# Patient Record
Sex: Female | Born: 1980
Health system: Southern US, Community
[De-identification: ages and names within clinical notes are randomized; demographics above are authoritative.]

## PROBLEM LIST (undated history)

## (undated) DIAGNOSIS — I10 Essential (primary) hypertension: Secondary | ICD-10-CM

## (undated) DIAGNOSIS — R0600 Dyspnea, unspecified: Secondary | ICD-10-CM

## (undated) DIAGNOSIS — G4730 Sleep apnea, unspecified: Secondary | ICD-10-CM

## (undated) DIAGNOSIS — Z9289 Personal history of other medical treatment: Secondary | ICD-10-CM

## (undated) DIAGNOSIS — K219 Gastro-esophageal reflux disease without esophagitis: Secondary | ICD-10-CM

## (undated) DIAGNOSIS — Z789 Other specified health status: Secondary | ICD-10-CM

## (undated) DIAGNOSIS — G459 Transient cerebral ischemic attack, unspecified: Secondary | ICD-10-CM

## (undated) DIAGNOSIS — L930 Discoid lupus erythematosus: Secondary | ICD-10-CM

## (undated) DIAGNOSIS — A0472 Enterocolitis due to Clostridium difficile, not specified as recurrent: Secondary | ICD-10-CM

## (undated) DIAGNOSIS — D539 Nutritional anemia, unspecified: Secondary | ICD-10-CM

## (undated) DIAGNOSIS — I509 Heart failure, unspecified: Secondary | ICD-10-CM

## (undated) DIAGNOSIS — M329 Systemic lupus erythematosus, unspecified: Secondary | ICD-10-CM

## (undated) DIAGNOSIS — N189 Chronic kidney disease, unspecified: Secondary | ICD-10-CM

## (undated) DIAGNOSIS — I429 Cardiomyopathy, unspecified: Secondary | ICD-10-CM

## (undated) DIAGNOSIS — I639 Cerebral infarction, unspecified: Secondary | ICD-10-CM

## (undated) DIAGNOSIS — I272 Pulmonary hypertension, unspecified: Secondary | ICD-10-CM

## (undated) HISTORY — PX: OTHER SURGICAL HISTORY: SHX169

## (undated) HISTORY — DX: Systemic lupus erythematosus, unspecified: M32.9

## (undated) HISTORY — DX: Nutritional anemia, unspecified: D53.9

## (undated) HISTORY — PX: CARDIAC CATHETERIZATION: SHX172

## (undated) HISTORY — DX: Enterocolitis due to Clostridium difficile, not specified as recurrent: A04.72

## (undated) HISTORY — DX: Essential (primary) hypertension: I10

## (undated) HISTORY — DX: Discoid lupus erythematosus: L93.0

## (undated) HISTORY — PX: BREAST SURGERY: SHX581

## (undated) HISTORY — DX: Cardiomyopathy, unspecified: I42.9

## (undated) SURGERY — Surgical Case
Anesthesia: *Unknown

---

## 2002-08-30 ENCOUNTER — Emergency Department (HOSPITAL_COMMUNITY): Admission: EM | Admit: 2002-08-30 | Discharge: 2002-08-30 | Payer: Self-pay | Admitting: Emergency Medicine

## 2004-06-19 ENCOUNTER — Emergency Department (HOSPITAL_COMMUNITY): Admission: EM | Admit: 2004-06-19 | Discharge: 2004-06-19 | Payer: Self-pay | Admitting: Emergency Medicine

## 2004-06-30 ENCOUNTER — Ambulatory Visit (HOSPITAL_COMMUNITY): Admission: RE | Admit: 2004-06-30 | Discharge: 2004-06-30 | Payer: Self-pay | Admitting: Neurology

## 2004-07-01 ENCOUNTER — Emergency Department (HOSPITAL_COMMUNITY): Admission: EM | Admit: 2004-07-01 | Discharge: 2004-07-01 | Payer: Self-pay | Admitting: Emergency Medicine

## 2004-07-12 ENCOUNTER — Ambulatory Visit: Admission: RE | Admit: 2004-07-12 | Discharge: 2004-07-12 | Payer: Self-pay | Admitting: Neurology

## 2004-07-12 ENCOUNTER — Encounter (INDEPENDENT_AMBULATORY_CARE_PROVIDER_SITE_OTHER): Payer: Self-pay | Admitting: Cardiology

## 2005-02-23 ENCOUNTER — Emergency Department (HOSPITAL_COMMUNITY): Admission: EM | Admit: 2005-02-23 | Discharge: 2005-02-23 | Payer: Self-pay | Admitting: Emergency Medicine

## 2005-05-07 ENCOUNTER — Emergency Department (HOSPITAL_COMMUNITY): Admission: EM | Admit: 2005-05-07 | Discharge: 2005-05-08 | Payer: Self-pay | Admitting: Emergency Medicine

## 2005-05-22 ENCOUNTER — Ambulatory Visit: Payer: Self-pay | Admitting: Family Medicine

## 2005-06-15 ENCOUNTER — Ambulatory Visit: Payer: Self-pay | Admitting: Family Medicine

## 2005-06-15 ENCOUNTER — Inpatient Hospital Stay (HOSPITAL_COMMUNITY): Admission: EM | Admit: 2005-06-15 | Discharge: 2005-06-19 | Payer: Self-pay | Admitting: Emergency Medicine

## 2005-06-19 ENCOUNTER — Ambulatory Visit: Payer: Self-pay | Admitting: *Deleted

## 2005-07-03 ENCOUNTER — Ambulatory Visit: Payer: Self-pay | Admitting: Internal Medicine

## 2005-07-07 ENCOUNTER — Ambulatory Visit: Payer: Self-pay | Admitting: Internal Medicine

## 2005-07-17 ENCOUNTER — Ambulatory Visit: Payer: Self-pay | Admitting: Internal Medicine

## 2005-08-02 ENCOUNTER — Ambulatory Visit: Payer: Self-pay | Admitting: Internal Medicine

## 2005-08-10 ENCOUNTER — Ambulatory Visit: Payer: Self-pay | Admitting: Internal Medicine

## 2005-08-15 ENCOUNTER — Ambulatory Visit (HOSPITAL_COMMUNITY): Admission: RE | Admit: 2005-08-15 | Discharge: 2005-08-15 | Payer: Self-pay | Admitting: Internal Medicine

## 2005-08-15 ENCOUNTER — Encounter (INDEPENDENT_AMBULATORY_CARE_PROVIDER_SITE_OTHER): Payer: Self-pay | Admitting: Cardiology

## 2005-10-11 ENCOUNTER — Ambulatory Visit: Payer: Self-pay | Admitting: Family Medicine

## 2005-10-27 ENCOUNTER — Encounter: Admission: RE | Admit: 2005-10-27 | Discharge: 2005-10-27 | Payer: Self-pay | Admitting: Family Medicine

## 2005-11-15 ENCOUNTER — Ambulatory Visit: Payer: Self-pay | Admitting: Family Medicine

## 2005-11-15 ENCOUNTER — Emergency Department (HOSPITAL_COMMUNITY): Admission: EM | Admit: 2005-11-15 | Discharge: 2005-11-15 | Payer: Self-pay | Admitting: Emergency Medicine

## 2006-01-15 ENCOUNTER — Ambulatory Visit: Payer: Self-pay | Admitting: Cardiology

## 2006-01-15 ENCOUNTER — Inpatient Hospital Stay (HOSPITAL_COMMUNITY): Admission: EM | Admit: 2006-01-15 | Discharge: 2006-01-23 | Payer: Self-pay | Admitting: Emergency Medicine

## 2006-01-15 ENCOUNTER — Encounter (INDEPENDENT_AMBULATORY_CARE_PROVIDER_SITE_OTHER): Payer: Self-pay | Admitting: *Deleted

## 2006-01-25 ENCOUNTER — Ambulatory Visit: Payer: Self-pay | Admitting: Cardiology

## 2006-01-29 ENCOUNTER — Emergency Department (HOSPITAL_COMMUNITY): Admission: EM | Admit: 2006-01-29 | Discharge: 2006-01-29 | Payer: Self-pay | Admitting: Emergency Medicine

## 2006-01-31 ENCOUNTER — Ambulatory Visit: Payer: Self-pay | Admitting: *Deleted

## 2006-02-01 ENCOUNTER — Ambulatory Visit: Payer: Self-pay | Admitting: Cardiology

## 2006-02-07 ENCOUNTER — Ambulatory Visit: Payer: Self-pay | Admitting: Cardiovascular Disease

## 2006-02-07 ENCOUNTER — Ambulatory Visit: Payer: Self-pay | Admitting: Cardiology

## 2006-02-09 ENCOUNTER — Ambulatory Visit: Payer: Self-pay | Admitting: Internal Medicine

## 2006-02-15 ENCOUNTER — Ambulatory Visit: Payer: Self-pay | Admitting: Cardiology

## 2006-02-26 ENCOUNTER — Ambulatory Visit: Payer: Self-pay | Admitting: Cardiology

## 2006-03-15 ENCOUNTER — Ambulatory Visit: Payer: Self-pay | Admitting: Cardiology

## 2006-04-02 ENCOUNTER — Ambulatory Visit: Payer: Self-pay | Admitting: Cardiology

## 2006-04-09 ENCOUNTER — Ambulatory Visit: Payer: Self-pay | Admitting: Hematology and Oncology

## 2006-07-19 ENCOUNTER — Ambulatory Visit: Payer: Self-pay | Admitting: Cardiology

## 2006-07-25 ENCOUNTER — Ambulatory Visit: Payer: Self-pay | Admitting: Cardiology

## 2006-08-03 ENCOUNTER — Ambulatory Visit: Payer: Self-pay | Admitting: Cardiology

## 2006-08-07 ENCOUNTER — Encounter: Payer: Self-pay | Admitting: Internal Medicine

## 2006-08-07 ENCOUNTER — Ambulatory Visit: Payer: Self-pay | Admitting: Internal Medicine

## 2006-08-07 DIAGNOSIS — D649 Anemia, unspecified: Secondary | ICD-10-CM

## 2006-08-07 DIAGNOSIS — L930 Discoid lupus erythematosus: Secondary | ICD-10-CM

## 2006-08-07 HISTORY — DX: Discoid lupus erythematosus: L93.0

## 2006-08-10 ENCOUNTER — Ambulatory Visit: Payer: Self-pay | Admitting: Cardiology

## 2006-08-24 ENCOUNTER — Ambulatory Visit: Payer: Self-pay | Admitting: Cardiology

## 2006-09-11 ENCOUNTER — Ambulatory Visit: Payer: Self-pay | Admitting: Cardiology

## 2006-09-11 ENCOUNTER — Ambulatory Visit: Payer: Self-pay

## 2006-09-12 ENCOUNTER — Ambulatory Visit: Payer: Self-pay | Admitting: Cardiovascular Disease

## 2006-09-25 ENCOUNTER — Ambulatory Visit: Payer: Self-pay

## 2006-09-25 ENCOUNTER — Ambulatory Visit: Payer: Self-pay | Admitting: Cardiology

## 2006-09-25 ENCOUNTER — Encounter: Payer: Self-pay | Admitting: Internal Medicine

## 2006-09-25 ENCOUNTER — Ambulatory Visit: Payer: Self-pay | Admitting: Internal Medicine

## 2006-11-01 ENCOUNTER — Ambulatory Visit: Payer: Self-pay | Admitting: Cardiology

## 2007-04-08 ENCOUNTER — Ambulatory Visit: Payer: Self-pay | Admitting: Internal Medicine

## 2007-06-28 ENCOUNTER — Encounter: Payer: Self-pay | Admitting: Internal Medicine

## 2007-07-10 ENCOUNTER — Encounter (HOSPITAL_COMMUNITY): Admission: RE | Admit: 2007-07-10 | Discharge: 2007-09-23 | Payer: Self-pay | Admitting: Nephrology

## 2007-08-05 ENCOUNTER — Observation Stay (HOSPITAL_COMMUNITY): Admission: EM | Admit: 2007-08-05 | Discharge: 2007-08-07 | Payer: Self-pay | Admitting: Emergency Medicine

## 2007-08-05 ENCOUNTER — Ambulatory Visit: Payer: Self-pay | Admitting: Cardiology

## 2007-08-06 ENCOUNTER — Encounter: Payer: Self-pay | Admitting: Cardiology

## 2007-08-26 ENCOUNTER — Ambulatory Visit: Payer: Self-pay | Admitting: Cardiology

## 2007-09-06 ENCOUNTER — Telehealth: Payer: Self-pay | Admitting: Internal Medicine

## 2007-11-13 ENCOUNTER — Encounter (HOSPITAL_COMMUNITY): Admission: RE | Admit: 2007-11-13 | Discharge: 2008-02-11 | Payer: Self-pay | Admitting: Nephrology

## 2008-10-02 ENCOUNTER — Ambulatory Visit: Payer: Self-pay | Admitting: Internal Medicine

## 2008-10-05 LAB — CONVERTED CEMR LAB
ALT: 22 units/L (ref 0–35)
AST: 33 units/L (ref 0–37)
Alkaline Phosphatase: 76 units/L (ref 39–117)
Basophils Absolute: 0 10*3/uL (ref 0.0–0.1)
Calcium: 8.8 mg/dL (ref 8.4–10.5)
Creatinine, Ser: 0.8 mg/dL (ref 0.4–1.2)
GFR calc non Af Amer: 91 mL/min
Hemoglobin: 10.7 g/dL — ABNORMAL LOW (ref 12.0–15.0)
Lymphocytes Relative: 28.2 % (ref 12.0–46.0)
MCHC: 32.5 g/dL (ref 30.0–36.0)
MCV: 83.6 fL (ref 78.0–100.0)
Neutro Abs: 1.6 10*3/uL (ref 1.4–7.7)
Neutrophils Relative %: 57.4 % (ref 43.0–77.0)
Pro B Natriuretic peptide (BNP): 8 pg/mL (ref 0.0–100.0)
RBC: 3.93 M/uL (ref 3.87–5.11)
RDW: 12.5 % (ref 11.5–14.6)
Total Bilirubin: 0.6 mg/dL (ref 0.3–1.2)
Total Protein: 7.3 g/dL (ref 6.0–8.3)

## 2008-11-03 ENCOUNTER — Ambulatory Visit: Payer: Self-pay | Admitting: Internal Medicine

## 2008-11-09 ENCOUNTER — Ambulatory Visit: Payer: Self-pay | Admitting: Cardiology

## 2008-11-19 ENCOUNTER — Encounter: Payer: Self-pay | Admitting: Cardiology

## 2008-11-19 ENCOUNTER — Ambulatory Visit: Payer: Self-pay

## 2009-03-02 ENCOUNTER — Ambulatory Visit: Payer: Self-pay | Admitting: Internal Medicine

## 2009-03-04 LAB — CONVERTED CEMR LAB
Alkaline Phosphatase: 74 units/L (ref 39–117)
Basophils Absolute: 0 10*3/uL (ref 0.0–0.1)
Basophils Relative: 0.2 % (ref 0.0–3.0)
Bilirubin, Direct: 0.1 mg/dL (ref 0.0–0.3)
Calcium: 8.9 mg/dL (ref 8.4–10.5)
Chloride: 111 meq/L (ref 96–112)
Eosinophils Relative: 1.8 % (ref 0.0–5.0)
HCT: 32.8 % — ABNORMAL LOW (ref 36.0–46.0)
Lymphocytes Relative: 22.1 % (ref 12.0–46.0)
Lymphs Abs: 0.7 10*3/uL (ref 0.7–4.0)
Monocytes Relative: 13.5 % — ABNORMAL HIGH (ref 3.0–12.0)
Neutro Abs: 2.1 10*3/uL (ref 1.4–7.7)
Neutrophils Relative %: 62.4 % (ref 43.0–77.0)
Phosphorus: 3.4 mg/dL (ref 2.3–4.6)
Platelets: 245 10*3/uL (ref 150.0–400.0)
Sodium: 140 meq/L (ref 135–145)

## 2009-04-07 ENCOUNTER — Ambulatory Visit: Payer: Self-pay | Admitting: Internal Medicine

## 2009-07-09 ENCOUNTER — Encounter: Payer: Self-pay | Admitting: Cardiology

## 2009-07-09 ENCOUNTER — Encounter: Payer: Self-pay | Admitting: Internal Medicine

## 2009-07-14 ENCOUNTER — Encounter (INDEPENDENT_AMBULATORY_CARE_PROVIDER_SITE_OTHER): Payer: Self-pay | Admitting: *Deleted

## 2009-07-14 DIAGNOSIS — I429 Cardiomyopathy, unspecified: Secondary | ICD-10-CM

## 2009-07-14 DIAGNOSIS — M329 Systemic lupus erythematosus, unspecified: Secondary | ICD-10-CM

## 2009-07-14 DIAGNOSIS — Z8673 Personal history of transient ischemic attack (TIA), and cerebral infarction without residual deficits: Secondary | ICD-10-CM | POA: Insufficient documentation

## 2009-07-21 ENCOUNTER — Ambulatory Visit: Payer: Self-pay | Admitting: Cardiology

## 2009-07-28 ENCOUNTER — Encounter: Payer: Self-pay | Admitting: Cardiology

## 2009-07-28 ENCOUNTER — Encounter (INDEPENDENT_AMBULATORY_CARE_PROVIDER_SITE_OTHER): Payer: Self-pay | Admitting: *Deleted

## 2009-08-05 ENCOUNTER — Ambulatory Visit: Payer: Self-pay | Admitting: Cardiovascular Disease

## 2009-08-05 ENCOUNTER — Encounter: Payer: Self-pay | Admitting: Cardiology

## 2009-08-05 ENCOUNTER — Ambulatory Visit: Payer: Self-pay

## 2009-08-05 ENCOUNTER — Ambulatory Visit (HOSPITAL_COMMUNITY): Admission: RE | Admit: 2009-08-05 | Discharge: 2009-08-05 | Payer: Self-pay | Admitting: Cardiovascular Disease

## 2009-08-10 ENCOUNTER — Telehealth: Payer: Self-pay | Admitting: Cardiology

## 2009-08-11 ENCOUNTER — Encounter: Admission: RE | Admit: 2009-08-11 | Discharge: 2009-08-11 | Payer: Self-pay | Admitting: Obstetrics and Gynecology

## 2009-09-03 ENCOUNTER — Ambulatory Visit: Payer: Self-pay | Admitting: Cardiology

## 2009-11-04 ENCOUNTER — Telehealth: Payer: Self-pay | Admitting: Internal Medicine

## 2010-01-10 ENCOUNTER — Encounter: Admission: RE | Admit: 2010-01-10 | Discharge: 2010-01-10 | Payer: Self-pay | Admitting: Obstetrics and Gynecology

## 2010-01-18 ENCOUNTER — Encounter: Admission: RE | Admit: 2010-01-18 | Discharge: 2010-01-18 | Payer: Self-pay | Admitting: Obstetrics and Gynecology

## 2010-05-02 ENCOUNTER — Ambulatory Visit: Payer: Self-pay | Admitting: Internal Medicine

## 2010-05-03 ENCOUNTER — Encounter: Payer: Self-pay | Admitting: Internal Medicine

## 2010-05-25 ENCOUNTER — Encounter: Payer: Self-pay | Admitting: Internal Medicine

## 2010-05-25 ENCOUNTER — Encounter: Payer: Self-pay | Admitting: Cardiology

## 2010-06-09 ENCOUNTER — Ambulatory Visit: Payer: Self-pay | Admitting: Internal Medicine

## 2010-06-14 ENCOUNTER — Ambulatory Visit: Payer: Self-pay | Admitting: Internal Medicine

## 2010-06-14 ENCOUNTER — Encounter: Payer: Self-pay | Admitting: Internal Medicine

## 2010-06-23 ENCOUNTER — Telehealth: Payer: Self-pay | Admitting: Internal Medicine

## 2010-06-28 ENCOUNTER — Ambulatory Visit: Payer: Self-pay | Admitting: Cardiovascular Disease

## 2010-06-30 ENCOUNTER — Ambulatory Visit: Payer: Self-pay | Admitting: Internal Medicine

## 2010-07-08 ENCOUNTER — Encounter: Payer: Self-pay | Admitting: Cardiology

## 2010-07-08 ENCOUNTER — Telehealth: Payer: Self-pay | Admitting: Cardiology

## 2010-07-12 ENCOUNTER — Ambulatory Visit: Payer: Self-pay | Admitting: Cardiology

## 2010-07-14 LAB — CONVERTED CEMR LAB
BUN: 17 mg/dL (ref 6–23)
Basophils Relative: 0.6 % (ref 0.0–3.0)
CO2: 25 meq/L (ref 19–32)
Calcium: 8.8 mg/dL (ref 8.4–10.5)
Creatinine, Ser: 1.1 mg/dL (ref 0.4–1.2)
Eosinophils Absolute: 0.3 10*3/uL (ref 0.0–0.7)
Glucose, Bld: 89 mg/dL (ref 70–99)
HCT: 33.3 % — ABNORMAL LOW (ref 36.0–46.0)
Hemoglobin: 11.1 g/dL — ABNORMAL LOW (ref 12.0–15.0)
INR: 1.1 — ABNORMAL HIGH (ref 0.8–1.0)
Lymphocytes Relative: 21.9 % (ref 12.0–46.0)
Lymphs Abs: 0.7 10*3/uL (ref 0.7–4.0)
MCHC: 33.3 g/dL (ref 30.0–36.0)
MCV: 84.3 fL (ref 78.0–100.0)
Monocytes Absolute: 0.3 10*3/uL (ref 0.1–1.0)
Monocytes Relative: 7.9 % (ref 3.0–12.0)
Neutro Abs: 2 10*3/uL (ref 1.4–7.7)
Neutrophils Relative %: 59.5 % (ref 43.0–77.0)
Potassium: 4.6 meq/L (ref 3.5–5.1)

## 2010-07-15 ENCOUNTER — Ambulatory Visit: Payer: Self-pay | Admitting: Internal Medicine

## 2010-07-15 ENCOUNTER — Inpatient Hospital Stay (HOSPITAL_BASED_OUTPATIENT_CLINIC_OR_DEPARTMENT_OTHER): Admission: RE | Admit: 2010-07-15 | Discharge: 2010-07-15 | Payer: Self-pay | Admitting: Internal Medicine

## 2010-07-16 ENCOUNTER — Telehealth: Payer: Self-pay | Admitting: Internal Medicine

## 2010-07-28 ENCOUNTER — Encounter: Payer: Self-pay | Admitting: Internal Medicine

## 2010-07-28 ENCOUNTER — Ambulatory Visit: Payer: Self-pay | Admitting: Internal Medicine

## 2010-07-28 DIAGNOSIS — I272 Pulmonary hypertension, unspecified: Secondary | ICD-10-CM

## 2010-08-01 ENCOUNTER — Telehealth: Payer: Self-pay | Admitting: Cardiology

## 2010-08-01 LAB — CONVERTED CEMR LAB
ALT: 23 units/L (ref 0–35)
AST: 30 units/L (ref 0–37)
Alkaline Phosphatase: 100 units/L (ref 39–117)
Bilirubin, Direct: 0.1 mg/dL (ref 0.0–0.3)
Total Bilirubin: 0.5 mg/dL (ref 0.3–1.2)

## 2010-08-08 ENCOUNTER — Ambulatory Visit: Payer: Self-pay | Admitting: Cardiology

## 2010-08-10 ENCOUNTER — Telehealth: Payer: Self-pay | Admitting: Internal Medicine

## 2010-08-13 ENCOUNTER — Encounter: Payer: Self-pay | Admitting: Internal Medicine

## 2010-08-15 ENCOUNTER — Telehealth: Payer: Self-pay | Admitting: Internal Medicine

## 2010-08-15 ENCOUNTER — Encounter: Payer: Self-pay | Admitting: Internal Medicine

## 2010-08-17 ENCOUNTER — Encounter: Payer: Self-pay | Admitting: Internal Medicine

## 2010-09-06 ENCOUNTER — Telehealth: Payer: Self-pay | Admitting: Internal Medicine

## 2010-09-06 ENCOUNTER — Ambulatory Visit: Payer: Self-pay | Admitting: Internal Medicine

## 2010-09-07 ENCOUNTER — Ambulatory Visit: Payer: Self-pay | Admitting: Family Medicine

## 2010-09-16 ENCOUNTER — Telehealth (INDEPENDENT_AMBULATORY_CARE_PROVIDER_SITE_OTHER): Payer: Self-pay | Admitting: *Deleted

## 2010-09-19 ENCOUNTER — Ambulatory Visit: Payer: Self-pay | Admitting: Internal Medicine

## 2010-09-20 LAB — CONVERTED CEMR LAB
AST: 18 units/L (ref 0–37)
Albumin: 3.8 g/dL (ref 3.5–5.2)
Bilirubin, Direct: 0 mg/dL (ref 0.0–0.3)
Total Bilirubin: 0.2 mg/dL — ABNORMAL LOW (ref 0.3–1.2)
hCG, Beta Chain, Quant, S: 0.21 milliintl units/mL

## 2010-09-22 ENCOUNTER — Ambulatory Visit: Payer: Self-pay | Admitting: Internal Medicine

## 2010-09-22 DIAGNOSIS — I10 Essential (primary) hypertension: Secondary | ICD-10-CM | POA: Insufficient documentation

## 2010-09-26 ENCOUNTER — Ambulatory Visit: Payer: Self-pay | Admitting: Internal Medicine

## 2010-10-03 ENCOUNTER — Emergency Department (HOSPITAL_COMMUNITY)
Admission: EM | Admit: 2010-10-03 | Discharge: 2010-10-03 | Payer: Self-pay | Source: Home / Self Care | Admitting: Emergency Medicine

## 2010-10-07 ENCOUNTER — Encounter: Payer: Self-pay | Admitting: Internal Medicine

## 2010-10-07 ENCOUNTER — Encounter: Payer: Self-pay | Admitting: Cardiology

## 2010-10-08 ENCOUNTER — Ambulatory Visit: Payer: Self-pay | Admitting: Family Medicine

## 2010-10-08 DIAGNOSIS — H6690 Otitis media, unspecified, unspecified ear: Secondary | ICD-10-CM | POA: Insufficient documentation

## 2010-10-31 ENCOUNTER — Encounter: Payer: Self-pay | Admitting: Cardiology

## 2010-10-31 ENCOUNTER — Ambulatory Visit
Admission: RE | Admit: 2010-10-31 | Discharge: 2010-10-31 | Payer: Self-pay | Source: Home / Self Care | Attending: Internal Medicine | Admitting: Internal Medicine

## 2010-10-31 ENCOUNTER — Other Ambulatory Visit: Payer: Self-pay | Admitting: Internal Medicine

## 2010-10-31 LAB — HEPATIC FUNCTION PANEL
ALT: 17 U/L (ref 0–35)
AST: 20 U/L (ref 0–37)
Albumin: 3.4 g/dL — ABNORMAL LOW (ref 3.5–5.2)
Alkaline Phosphatase: 62 U/L (ref 39–117)
Bilirubin, Direct: 0.1 mg/dL (ref 0.0–0.3)
Total Bilirubin: 0.5 mg/dL (ref 0.3–1.2)
Total Protein: 6.8 g/dL (ref 6.0–8.3)

## 2010-10-31 LAB — PREGNANCY SERUM, QUANT: hCG, Beta Chain, Quant, S: 0.53 m[IU]/mL

## 2010-11-12 ENCOUNTER — Encounter: Payer: Self-pay | Admitting: Internal Medicine

## 2010-11-13 ENCOUNTER — Encounter: Payer: Self-pay | Admitting: Family Medicine

## 2010-11-20 LAB — CONVERTED CEMR LAB
CO2: 26 meq/L (ref 19–32)
Chloride: 107 meq/L (ref 96–112)
Potassium: 4.6 meq/L (ref 3.5–5.1)

## 2010-11-22 NOTE — Progress Notes (Signed)
Summary: PFTS 8/23 show restriction. Needs HRCT  Phone Note Outgoing Call   Summary of Call: [facility] on 'home" phone which is her cell. PFTs shows restriction with low diffusion - this means she could have lung scarrring due to lupus. Therefore, she should have High res cT chest. If those are negative, she will need right heart cath but I will talk to her after results of high res ct chest. she should see me after high res ct chest Initial call taken by: Brand Males MD,  June 23, 2010 3:22 PM  Follow-up for Phone Call        Spoke to patient. Advised of results. Will get HRCT chest. I will call her with results and decide on coming in for reviwe or right heart cath. Please get CT chest Follow-up by: Brand Males MD,  June 24, 2010 4:57 PM  Additional Follow-up for Phone Call Additional follow up Details #1::        CT chest done on 06-30-10 pt has f/u on 06-30-10 with mr. pt aware. [place] Bing CMA  June 29, 2010 1:12 PM      Appended Document: PFTS 8/23 show restriction. Needs HRCT correction ct done on 06-28-10. follow-up set for 06-30-10.

## 2010-11-22 NOTE — Assessment & Plan Note (Signed)
Summary: EPH/JML   Visit Type:  EPH Referring Provider:  Dr [name] [name] Primary Provider:  Phoebe Sharps MD, Dr. Binnie [name] - Rheum, Dr. Salem [name] - renal, Dr [name] [name] - cards  CC:  chest discomfort....sob....edema/legs.....  History of Present Illness: [name] [name] returns today for followup of her secondary pulmonary hypertension, lower extremity edema, and history of cardiomyopathy which resolved.  She underwent cardiac catheterization on July 15, 2010. This showed RV systolic pressure of 50 mm of mercury, PEEP of 8, one artery pressure 53/27 with a mean of 38 consistent with moderate pulmonary hypertension, cardiac index of 2.5 with a Fick cardiac output of 5.5, wedge pressure of 18, pulmonary sat and SVC sat of 63% on room air.  We increased her Lasix to 40 mg a day. Her swelling has improved. She seems to be less short of breath and less dyspneic. She is scheduled to start Letairis one she gets financial help with the drug company. She is not started yet.  Her weight is a significant issue with her pulmonary hypertension as well. She has been counseled in the past to lose weight.  Of note, her BNP was only 123 on July 28, 2010. Her LFTs were normal. A high-resolution CT showed no interstitial lung disease. She carries a diagnosis of lupus.  Current Medications (verified): 1)  Plaquenil 200 Mg Tabs (Hydroxychloroquine Sulfate) .... Take 1 Tablet By Mouth Twice A Day 2)  Lisinopril 20 Mg Tabs (Lisinopril) .[name] Kitchen.. 1 By Mouth Two Times A Day 3)  Cellcept 500 Mg Tabs (Mycophenolate Mofetil) .... Take 2 Tabs Once Daily 4)  Coreg 25 Mg Tabs (Carvedilol) .... 2 By Mouth Bid 5)  Furosemide 40 Mg Tabs (Furosemide) .[name] Kitchen.. 1 Tab Once Daily 6)  Amlodipine Besylate 5 Mg  Tabs (Amlodipine Besylate) .[name] Kitchen.. 1 By Mouth Every Day 7)  Spironolactone 25 Mg Tabs (Spironolactone) .... Take 1 Tablet By Mouth Once A Day 8)  Prednisone (Pak) 10 Mg Tabs (Prednisone) .... Take 1 Tablet By Mouth Once A Day 9)   Letairis 5 Mg Tabs (Ambrisentan) .... Take 1 Tablet Daily 10)  Levaquin 500 Mg Tabs (Levofloxacin) .[name] Kitchen.. 1 Tab Once Daily  Allergies (verified): No Known Drug Allergies  Past History:  Past Medical History: Last updated: 06/09/2010 #s/p COUMADIN THERAPY (ICD-V58.61)  - given March 2007 for NICM ef 25%. Rx discontinuned dec 2007 following normalization of LVEF #CHEST PAIN, HX OF (ICD-V12.50) #PALPITATIONS, HX OF (ICD-V12.50) #TRANSIENT ISCHEMIC ATTACKS, HX OF (ICD-V12.50)....[name] KitchenMarland KitchenDr [name] [name] > Seen by [name] [name] 01/18/2006 > [name] [name] Aprol 2007 - not c/w TIA #CARDIOMYOPATHY, SECONDARY (ICD-425.9)....[name] KitchenMarland KitchenDr [name] [name]  - NICM ef 25% and bNP 1000s in March/April 2007. s/p normal left heart cardiac cath  - Right heart cath 01/18/2006:  RA mean 9, RV mean of 13. PA 60/34 with a mean 47. PCWP mean 32   - Normalization of ECHO LVEF in Dec 2007, and OCtober 2010 #RV DYSFUNCTION  > noted on echo 11/19/2008 and 08/09/2009 #HYPERTENSION, UNCONTROLLED (ICD-401.9) #ANEMIA, CHRONIC (ICD-281.9) #ERYTHEMATOSUS, LUPUS (ICD-695.4) #LUPUS NEPHRITIS (ICD-710.0)....[name] KitchenDR [name] [name]  > Bx 2007: Membranous lupus, WHO CLASS 2, and 2gm proteinuria #Hx of Medication Non compliance and complex medication regimen     Past Surgical History: Last updated: 08/07/2006 Denies surgical history  Family History: Last updated: 06/09/2010 Mother: lupus Father: alive and well Sister x 1-healthy Brotherx2 Healthy Ngeative Fx Hx of CAD MGM-rheumatism  Social History: Last updated: 11/05/2008 Occupation: retail for [employer] Never Smoked Alcohol use-no Regular exercise-no she has recently joined a  gym Drug Use - no  Risk Factors: Exercise: no (06/09/2010)  Risk Factors: Smoking Status: never (07/28/2010)  Review of Systems       negative history of present illness  Vital Signs:  Patient profile:   30 year old female Height:      67 inches Weight:      240.4 pounds BMI:     37.79 Pulse rate:   78 /  minute Pulse rhythm:   irregular BP sitting:   110 / 80  (left arm) Cuff size:   large  Vitals Entered By: Julaine Hua, CMA (August 08, 2010 1:52 PM)  Physical Exam  General:  obese, no acute distress Head:  normocephalic and atraumatic Eyes:  PERRLA/EOM intact; conjunctiva and lids normal. Neck:  Neck supple, no JVD. No masses, thyromegaly or abnormal cervical nodes. Chest [name] [name]:  no deformities or breast masses noted Lungs:  Clear bilaterally to auscultation and percussion. Heart:  PMI difficult to appreciate, normal S1-S2, no gallop, regular rate and rhythm, no right ventricular lift. Carotids full without bruits Msk:  Back normal, normal gait. Muscle strength and tone normal. Pulses:  pulses normal in all 4 extremities Extremities:  No clubbing or cyanosis.1+ left pedal edema and 1+ right pedal edema.   Neurologic:  Alert and oriented x 3. Skin:  Intact without lesions or rashes. Psych:  Normal affect.   Problems:  Medical Problems Added: 1)  Dx of Edema  (ICD-782.3) 2)  Dx of Edema  (ICD-782.3)  Impression & Recommendations:  Problem # 1:  PULMONARY HYPERTENSION, SECONDARY (ICD-416.8)  she will start the pulmonary vasodilator as soon as she gets help from the drug company. We will repeat a 2-D echocardiogram in 6 months to see any change in pulmonary pressures and right-sided function.  Orders: EKG w/ Interpretation (93000) TLB-BMP (Basic Metabolic Panel-BMET) (99991111)  Problem # 2:  SHORTNESS OF BREATH (SOB) (ICD-786.05) Assessment: Improved  Her updated medication list for this problem includes:    Lisinopril 20 Mg Tabs (Lisinopril) .[name] Kitchen... 1 by mouth two times a day    Coreg 25 Mg Tabs (Carvedilol) .[name] Kitchen... 2 by mouth bid    Furosemide 40 Mg Tabs (Furosemide) .[name] Kitchen... 1 tab once daily    Amlodipine Besylate 5 Mg Tabs (Amlodipine besylate) .[name] Kitchen... 1 by mouth every day    Spironolactone 25 Mg Tabs (Spironolactone) .[name] Kitchen... Take 1 tablet by mouth once a day  Her  updated medication list for this problem includes:    Lisinopril 20 Mg Tabs (Lisinopril) .[name] Kitchen... 1 by mouth two times a day    Coreg 25 Mg Tabs (Carvedilol) .[name] Kitchen... 2 by mouth bid    Furosemide 40 Mg Tabs (Furosemide) .[name] Kitchen... 1 tab once daily    Amlodipine Besylate 5 Mg Tabs (Amlodipine besylate) .[name] Kitchen... 1 by mouth every day    Spironolactone 25 Mg Tabs (Spironolactone) .[name] Kitchen... Take 1 tablet by mouth once a day  Problem # 3:  CARDIOMYOPATHY, SECONDARY (ICD-425.9) Assessment: Improved  Her updated medication list for this problem includes:    Lisinopril 20 Mg Tabs (Lisinopril) .[name] Kitchen... 1 by mouth two times a day    Coreg 25 Mg Tabs (Carvedilol) .[name] Kitchen... 2 by mouth bid    Furosemide 40 Mg Tabs (Furosemide) .[name] Kitchen... 1 tab once daily    Amlodipine Besylate 5 Mg Tabs (Amlodipine besylate) .[name] Kitchen... 1 by mouth every day    Spironolactone 25 Mg Tabs (Spironolactone) .[name] Kitchen... Take 1 tablet by mouth once a day  Her updated medication list for this problem includes:  Lisinopril 20 Mg Tabs (Lisinopril) .[name] Kitchen... 1 by mouth two times a day    Coreg 25 Mg Tabs (Carvedilol) .[name] Kitchen... 2 by mouth bid    Furosemide 40 Mg Tabs (Furosemide) .[name] Kitchen... 1 tab once daily    Amlodipine Besylate 5 Mg Tabs (Amlodipine besylate) .[name] Kitchen... 1 by mouth every day    Spironolactone 25 Mg Tabs (Spironolactone) .[name] Kitchen... Take 1 tablet by mouth once a day  Orders: EKG w/ Interpretation (93000)  Problem # 4:  RENAL DISEASE, CHRONIC, MILD (ICD-585.2) Will check electrolytes today on higher dose of furosemide.  Problem # 5:  EDEMA (ICD-782.3) Assessment: Improved no change in furosemide.  Clinical Reports Reviewed:  Cardiac Cath:  07/16/2010: Cardiac Cath Findings:   PROCEDURES PERFORMED:  Right heart catheterization.      FINDINGS:  Right atrial pressure mean of 6, RV pressure 50/1, with an   EDP of 8.  PA pressure 53/27 with a mean of 38.  Pulmonary capillary   wedge pressure mean of 18.  Fick cardiac output 5.5.  Cardiac index 2.5.   Pulmonary  vascular resistance was 3.6 Woods units.  Femoral artery   saturation 96% on room air.  Peak pulmonary artery saturation 63% and   63% on room air.  SVC saturation 63%.      ASSESSMENT:   1. Mild-to-moderate pulmonary arterial hypertension with also a       component of pulmonary venous hypertension in the setting of a       mildly elevated wedge pressure.   2. Adequate cardiac output.  No evidence of significant cor pulmonale.      PLAN/DISCUSSION:  She has mild-to-moderate mixed pulmonary hypertension   with a significant pulmonary arterial component and elevated pulmonary   vascular resistance.  Given her history of connective tissue disorder, I   would consider initiation of pulmonary vasodilators such as  bosentan.   I have discussed this with Dr. Chase Caller, would start at 62.5 mg b.i.d.   and titrate to 125 b.i.d. after a month of therapy.  We will repeat her   echo in approximately 6 months to assess further.               Shaune Pascal. Bensimhon, MD      01/18/2006: Cardiac Cath Findings:  CONCLUSION:  Nonischemic cardiomyopathy with moderate pulmonary hypertension  most likely related to her elevated EDP and pulmonary capillary wedge  pressure.   PLAN:  The patient will continue to have aggressive medical management of  her nonischemic cardiomyopathy.        ______________________________  Minus Breeding, M.D.   Patient Instructions: 1)  Your physician recommends that you schedule a follow-up appointment in: 6 months 2)  Your physician recommends that you continue on your current medications as directed. Please refer to the Current Medication list given to you today.  Appended Document: [facility]     Allergies: No Known Drug Allergies   EKG  Procedure date:  08/08/2010  Findings:      normal sinus rhythm, diffuse ST segment changes,

## 2010-11-22 NOTE — Assessment & Plan Note (Signed)
Summary: sob/cb   Visit Type:  Initial Consult Copy to:  Dr Arlean Hopping Primary Joselyne Spake/Referring Rufino Staup:  Phoebe Sharps MD, Dr. Binnie Rail - Rheum, Dr. Salem Senate - renal, Dr Mar Daring - cards  CC:  Pulmonary Consult for  SOB. Marland Kitchen  History of Present Illness: IOv 06/09/2010:   30 year old never smoker, oBese (BMI 37) with hx of 20# weight gain since 2005 AA female with multiorgan lupus (See past med hx for all details).   c/o chest tightness, dyspnea and chest pressure/pain. Insidious onset of symptoms few years ago. All symptoms go together. The chest tighness/pain/pressure is present all over chest but is more so in right breast area. Variable course. On and off symptoms. When recurs symptoms can last days or weeks and can be in remission for many months. DYspnea and chest tightness  brought on by bending over or minimal activities like grabbing something or minimal walking. Dyspnea relieved by sitting down and taking rest while chst pressure is relieved by deep palpation of right chest..  Denies associated wheezing, cough, fevers, hemoptysis, sputum but does report associated insomnia.   In review of her past history I note that she had NICM 25% in 2007 but this resolved later this year. She has seen Dr. Verl Blalock for her chest symptoms and has been reassured that this is not cardiac. Most recent ECHO on Oct 2010 was essentially normal but reports suggest some RV dysfunction. In addition, Dr Arlean Hopping notes from 05/25/2010 indicate ongoing diseaes activity  Preventive Screening-Counseling & Management  Alcohol-Tobacco     Smoking Status: never  Caffeine-Diet-Exercise     Does Patient Exercise: no  Current Medications (verified): 1)  Plaquenil 200 Mg Tabs (Hydroxychloroquine Sulfate) .... Take 1 Tablet By Mouth Twice A Day 2)  Lisinopril 20 Mg Tabs (Lisinopril) .Marland Kitchen.. 1 By Mouth Two Times A Day 3)  Cellcept 500 Mg Tabs (Mycophenolate Mofetil) .... Take 2 Tabs Once Daily 4)  Coreg 25 Mg  Tabs (Carvedilol) .... 2 By Mouth Bid 5)  Furosemide 20 Mg Tabs (Furosemide) .... Take 1 Tablet By Mouth Once A Day 6)  Amlodipine Besylate 5 Mg  Tabs (Amlodipine Besylate) .Marland Kitchen.. 1 By Mouth Every Day 7)  Spironolactone 25 Mg Tabs (Spironolactone) .... Take 1 Tablet By Mouth Once A Day 8)  Prednisone (Pak) 10 Mg Tabs (Prednisone) .... Take As Directed Taper Dose Pak  Allergies (verified): No Known Drug Allergies  Past History:  Past Medical History: #s/p COUMADIN THERAPY (ICD-V58.61)  - given March 2007 for NICM ef 25%. Rx discontinuned dec 2007 following normalization of LVEF #CHEST PAIN, HX OF (ICD-V12.50) #PALPITATIONS, HX OF (ICD-V12.50) #TRANSIENT ISCHEMIC ATTACKS, HX OF (ICD-V12.50)....Marland KitchenMarland KitchenDr Doy Mince > Seen by REynolds 01/18/2006 > Fu Sethi Aprol 2007 - not c/w TIA #CARDIOMYOPATHY, SECONDARY (ICD-425.9)....Marland KitchenMarland KitchenDr Mar Daring  - NICM ef 25% and bNP 1000s in March/April 2007. s/p normal left heart cardiac cath  - Right heart cath 01/18/2006:  RA mean 9, RV mean of 13. PA 60/34 with a mean 47. PCWP mean 32   - Normalization of ECHO LVEF in Dec 2007, and OCtober 2010 #RV DYSFUNCTION  > noted on echo 11/19/2008 and 08/09/2009 #HYPERTENSION, UNCONTROLLED (ICD-401.9) #ANEMIA, CHRONIC (ICD-281.9) #ERYTHEMATOSUS, LUPUS (ICD-695.4) #LUPUS NEPHRITIS (ICD-710.0)....Marland KitchenDR Salem Senate  > Bx 2007: Membranous lupus, WHO CLASS 2, and 2gm proteinuria #Hx of Medication Non compliance and complex medication regimen     Past Pulmonary History:  Pulmonary History: #SLE..................Marland KitchenDr Arlean Hopping and Dr. Salem Senate > diagnosed since 2002. Care under  DR Earnest Conroy and [facility] in pasth.  > Dr Rosann Auerbach patient since 2009 > Organs involved: Per hx dermal, nephritis, joints, and Raynaud's > Aug 2011 meds: Cellcept since 2007 but regular since 2009, Prednisone since 2002, Plaquenil since 2002 > Disease activity MArch 2011: Normal C3, C4. ANA 1:2560, DS DNA 78, UA - trace blood, ESR 63  Family History: Mother:  lupus Father: alive and well Sister x 1-healthy Brotherx2 Healthy Ngeative Fx Hx of CAD MGM-rheumatism  Review of Systems       The patient complains of shortness of breath with activity, shortness of breath at rest, chest pain, and irregular heartbeats.  The patient denies productive cough, non-productive cough, coughing up blood, acid heartburn, indigestion, loss of appetite, weight change, abdominal pain, difficulty swallowing, sore throat, tooth/dental problems, headaches, nasal congestion/difficulty breathing through nose, sneezing, itching, ear ache, anxiety, depression, hand/feet swelling, joint stiffness or pain, rash, change in color of mucus, and fever.    Vital Signs:  Patient profile:   30 year old female Height:      67 inches Weight:      240 pounds BMI:     37.73 O2 Sat:      99 % on Room air Temp:     99.2 degrees F oral Pulse rate:   87 / minute BP sitting:   118 / 72  (right arm) Cuff size:   regular  Vitals Entered By: [place] Bing CMA (June 09, 2010 10:57 AM)  O2 Flow:  Room air CC: Pulmonary Consult for  SOB.  Comments Medications reviewed with patient [place] Bing CMA  June 09, 2010 11:08 AM Daytime phone number verified with patient.    Physical Exam  General:  obese.  no acute distress, nontoxic appearing Head:  normocephalic and atraumatic Eyes:  PERRLA/EOM intact; conjunctiva and lids normal. Ears:  TM's intact and clear with normal canals and hearing Mouth:  Teeth, gums and palate normal. Oral mucosa normal. Neck:  Neck supple, no JVD. No masses, thyromegaly or abnormal cervical nodes. Chest Wall:  no deformities or breast masses noted Lungs:  clear without rales or decreased breath sounds. Heart:  regular rhythm, normal rate, no murmurs, no rubs, no gallops, and accentuated P2 (POSSIBLY).   Abdomen:  Bowel sounds positive; abdomen soft and non-tender without masses, organomegaly, or hernias noted. No hepatosplenomegaly. Msk:  Back  normal, normal gait. Muscle strength and tone normal. Pulses:  pulses normal in all 4 extremities Extremities:  no clubbing, cyanosis, edema, or deformity noted Neurologic:  Alert and oriented x 3.gait normal and strength normal.   Skin:  dry skin lesions in face Psych:  alert and cooperative; normal mood and affect; normal attention span and concentration   CXR  Procedure date:  08/05/2007  Findings:      Clinical Data:  Chest pain and shortness of breath.   CHEST - 2 VIEW - 08/05/07:   Comparison:  11/15/05.   Findings:  The trachea is midline.  The heart size is enlarged, as   before.  Bibasilar atelectasis and/or scar.  The lungs are otherwise   clear.  No pleural fluid.   IMPRESSION:   Bibasilar atelectasis and/or scar.    Read By:  Luretha Rued.,  M.D.   Released By:  Luretha Rued.,  M  Comments:      independently reviewd  MISC. Report  Procedure date:  12/21/2009  Findings:      ANA 1:2000 DS DNA 73 ESR 63 Normal complements  Comments:      per Dr. Estanislado Pandy notes  Impression & Recommendations:  Problem # 1:  SHORTNESS OF BREATH (SOB) (ICD-786.05) Assessment New  This is a obese 30 year old female with multiorgan lupus and prir NICM ef 25% in2007. Last echo in oct 2010 showed normal ef but some element of RV dysfuntion. Currently hx suggests ongoing lupus activity atleast as of march 2011. EXam is significant for  obesity and possibly a LOUD P2 suggestive of pulm htn.   DDx includes lupus involvement in lung (pleuritis, pneumonitis, pulm htn from lupus, ), complications of immunsupporessants, obesity and deconditioning.  I will get Full PFTs and 6 minute walk test and review those. Bsed on results of these will proceed with imaging versus CPST versus Right heart cath.   Orders: Consultation Level V [phone])  Other Orders: Pulmonary Referral (Pulmonary)  Patient Instructions: 1)  please hvae full PFT 2)  please have 6 minute walk test 3)   please have those in next 1-3 weeks 4)  call me after above to discuss next step

## 2010-11-22 NOTE — Assessment & Plan Note (Signed)
Summary: RASH/M[facility]   Visit Type:  Follow-up Copy to:  Dr Arlean Hopping Primary Provider/Referring Provider:  Phoebe Sharps MD, Dr. Binnie Rail - Rheum, Dr. Salem Senate - renal, Dr Mar Daring - cards  CC:  Pt here for chest pain and rash on right side under right breast. .  History of Present Illness: IOv 06/09/2010:   30 year old never smoker, oBese (BMI 37) with hx of 20# weight gain since 2005 AA female with multiorgan lupus (See past med hx for all details).   c/o chest tightness, dyspnea and chest pressure/pain. Insidious onset of symptoms few years ago. All symptoms go together. The chest tighness/pain/pressure is present all over chest but is more so in right breast area. Variable course. On and off symptoms. When recurs symptoms can last days or weeks and can be in remission for many months. DYspnea and chest tightness  brought on by bending over or minimal activities like grabbing something or minimal walking. Dyspnea relieved by sitting down and taking rest while chst pressure is relieved by deep palpation of right chest..  Denies associated wheezing, cough, fevers, hemoptysis, sputum but does report associated insomnia.   In review of her past history I note that she had NICM 25% in 2007 but this resolved later this year. She has seen Dr. Verl Blalock for her chest symptoms and has been reassured that this is not cardiac. Most recent ECHO on Oct 2010 was essentially normal but reports suggest some RV dysfunction. In addition, Dr Arlean Hopping notes from 05/25/2010 indicate ongoing diseaes activity  REC 1. 6 minute walk test 2. FUll PFT 3. CT chest based on FUll PFT  OV June 30, 2010: Here to review test results. No intermin complaints. No new issues. Symptoims still persist. 6 minute walk distance is 523 meters. PFTs showed restriction with low diffusion. Therefore, CT chest done today. Shows essentially normal lung parenchyma. Some trace rt pleural effusion that looks chronic. REC: Rt HEART  CATH    July 28, 2010: Had right heart cath 07/16/2010:. SHows Group 1 and 2 Pulmonary Hypertension. PA mean is 38, PCWP is 18. Transpulmonary gradient is 20. PVR is 3.6 wood units. No interim complaints. Still dyspneic with chest tightness during exertion. 6 minute walk test 480 meters without stopping or symptoms or desaturation. Lowest pulse ox was 93% though. REC: START LETAIRIS   September 06, 2010. ACUTE VISTI. Righ  infraxially chest pain x  4 days. Acute onset. Progressive. Pleuritic in nature. Moderately severe even at rest and severe with exertion or deep inspiration. Also worse with exertion. Made better by rest. Past 2 days since 09/05/2010 am assoicatd rash present in right chest - along right chest in front and back like a band. Denies associated fever, nausea, vomit, diarrhea, dysuria, ewight loss. She had called earlier to office and we had ordered VQ and d-dimer. She then stated she had rash. So we ensureed we were seeing her.  Of note: Now on letairis.     Preventive Screening-Counseling & Management  Alcohol-Tobacco     Smoking Status: never  Caffeine-Diet-Exercise     Does Patient Exercise: no  Current Medications (verified): 1)  Plaquenil 200 Mg Tabs (Hydroxychloroquine Sulfate) .... Take 1 Tablet By Mouth Twice A Day 2)  Lisinopril 20 Mg Tabs (Lisinopril) .Marland Kitchen.. 1 By Mouth Two Times A Day 3)  Cellcept 500 Mg Tabs (Mycophenolate Mofetil) .... Take 2 Tabs Once Daily 4)  Coreg 25 Mg Tabs (Carvedilol) .... 2 By Mouth Bid 5)  Furosemide 40 Mg Tabs (Furosemide) .Marland Kitchen.. 1 Tab Once Daily 6)  Amlodipine Besylate 5 Mg  Tabs (Amlodipine Besylate) .Marland Kitchen.. 1 By Mouth Every Day 7)  Spironolactone 25 Mg Tabs (Spironolactone) .... Take 1 Tablet By Mouth Once A Day 8)  Prednisone (Pak) 10 Mg Tabs (Prednisone) .... Take 1 Tablet By Mouth Once A Day  Allergies (verified): No Known Drug Allergies  Past History:  Past medical, surgical, family and social histories (including risk factors)  reviewed, and no changes noted (except as noted below).  Past Medical History: Reviewed history from 06/09/2010 and no changes required. #s/p COUMADIN THERAPY (ICD-V58.61)  - given March 2007 for NICM ef 25%. Rx discontinuned dec 2007 following normalization of LVEF #CHEST PAIN, HX OF (ICD-V12.50) #PALPITATIONS, HX OF (ICD-V12.50) #TRANSIENT ISCHEMIC ATTACKS, HX OF (ICD-V12.50)....Marland KitchenMarland KitchenDr Doy Mince > Seen by REynolds 01/18/2006 > Fu Sethi Aprol 2007 - not c/w TIA #CARDIOMYOPATHY, SECONDARY (ICD-425.9)....Marland KitchenMarland KitchenDr Mar Daring  - NICM ef 25% and bNP 1000s in March/April 2007. s/p normal left heart cardiac cath  - Right heart cath 01/18/2006:  RA mean 9, RV mean of 13. PA 60/34 with a mean 47. PCWP mean 32   - Normalization of ECHO LVEF in Dec 2007, and OCtober 2010 #RV DYSFUNCTION  > noted on echo 11/19/2008 and 08/09/2009 #HYPERTENSION, UNCONTROLLED (ICD-401.9) #ANEMIA, CHRONIC (ICD-281.9) #ERYTHEMATOSUS, LUPUS (ICD-695.4) #LUPUS NEPHRITIS (ICD-710.0)....Marland KitchenDR Salem Senate  > Bx 2007: Membranous lupus, WHO CLASS 2, and 2gm proteinuria #Hx of Medication Non compliance and complex medication regimen     Past Surgical History: Reviewed history from 08/07/2006 and no changes required. Denies surgical history  Past Pulmonary History:  Pulmonary History: #SLE..................Marland KitchenDr Arlean Hopping and Dr. Salem Senate > diagnosed since 2002. Care under DR Z and [facility] in [facility].  > Dr Rosann Auerbach patient since 2009 > Organs involved: Per hx dermal, nephritis, joints, and Raynaud's > Aug 2011 meds: Cellcept since 2007 but regular since 2009, Prednisone since 2002, Plaquenil since 2002 > Disease activity MArch 2011: Normal C3, C4. ANA 1:2560, DS DNA 78, UA - trace blood, ESR 63  Family History: Reviewed history from 06/09/2010 and no changes required. Mother: lupus Father: alive and well Sister x 1-healthy Brotherx2 Healthy Ngeative Fx Hx of CAD MGM-rheumatism  Social History: Reviewed history from  11/05/2008 and no changes required. Occupation: retail for [employer] Never Smoked Alcohol use-no Regular exercise-no she has recently joined a gym Drug Use - no  Review of Systems       The patient complains of chest pain and rash.  The patient denies shortness of breath with activity, shortness of breath at rest, productive cough, non-productive cough, coughing up blood, irregular heartbeats, acid heartburn, indigestion, loss of appetite, weight change, abdominal pain, difficulty swallowing, sore throat, tooth/dental problems, headaches, nasal congestion/difficulty breathing through nose, sneezing, itching, ear ache, anxiety, depression, hand/feet swelling, joint stiffness or pain, change in color of mucus, and fever.    Vital Signs:  Patient profile:   30 year old female Height:      67 inches Weight:      240 pounds BMI:     37.73 O2 Sat:      100 % on Room air Temp:     98.7 degrees F oral Pulse rate:   87 / minute BP sitting:   120 / 60  (right arm) Cuff size:   regular  Vitals Entered By: Eagle Bing CMA (September 06, 2010 4:09 PM)  O2 Flow:  Room air CC: Pt here for chest pain and  rash on right side under right breast.  Comments Medications reviewed with patient [place] Bing CMA  September 06, 2010 4:12 PM Daytime phone number verified with patient.    Physical Exam  General:  obese.  no acute distress, nontoxic appearing Head:  normocephalic and atraumatic Eyes:  PERRLA/EOM intact; conjunctiva and lids normal. Ears:  TM's intact and clear with normal canals and hearing Mouth:  Teeth, gums and palate normal. Oral mucosa normal. Neck:  Neck supple, no JVD. No masses, thyromegaly or abnormal cervical nodes. Chest Wall:  no deformities or breast masses noted Lungs:  clear without rales or decreased breath sounds. Heart:  regular rhythm, normal rate, no murmurs, no rubs, no gallops, and accentuated P2 (POSSIBLY).   Abdomen:  Bowel sounds positive; abdomen  soft and non-tender without masses, organomegaly, or hernias noted. No hepatosplenomegaly. Msk:  Back normal, normal gait. Muscle strength and tone normal. Pulses:  pulses normal in all 4 extremities Extremities:  no clubbing, cyanosis, edema, or deformity noted Neurologic:  Alert and oriented x 3.gait normal and strength normal.   Skin:  shingles rash in right T 8-9 dermatome mostly right infra axillary and extending posteriorly and anteriorly  Psych:  alert and cooperative; normal mood and affect; normal attention span and concentration   Impression & Recommendations:  Problem # 1:  SHINGLES (ICD-053.9) Assessment New  classic shingles (chest pain 4 days but rash is <48 - started 09/05/2010 am)  PLAN Though she is only 28 year becaues of Lupus, and being on cell cept and prednisone it is best she talke VALTREX 1 gram three times a day with meals. ADvised of GI side effects, confusion and renal issues. ADvised to drink lot of water. Explained complication of post herpetic neuralgia and importance of taking valtrex. FU with Dr. Leanne Chang PMD in < 7 - 10 days. Cancel VQ scan  Orders: Est. Patient Level III [id number])  Medications Added to Medication List This Visit: 1)  Valtrex 1 Gm Tabs (Valacyclovir hcl) .... Three times daily after meals  Patient Instructions: 1)  take valtrex as directed 2)   - start first dose tonight//asap 3)   - drink lot of water with this medication - atleast 2 liters perday 4)   - take with meals 5)   - watch for nausea, vomit, rash, confusion 6)  follow with Dr. Leanne Chang in less than week to 10 days Prescriptions: VALTREX 1 GM TABS (VALACYCLOVIR HCL) three times daily after meals  #30 x 0   Entered and Authorized by:   Brand Males MD   Signed by:   Brand Males MD on 09/06/2010   Method used:   Electronically to        [facility] # [phone]* (retail)       [address]       [place], [place]  [address]       Ph: [phone]       Fax:  [phone]   [place]:   [phone]

## 2010-11-22 NOTE — Progress Notes (Signed)
Summary: question about plaquenil  Phone Note Call from Patient Call back at Home Phone [phone]   Caller: Patient Call For: Phoebe Sharps MD Summary of Call: wants [place] to check on her plaquenil as she thought it should be a different dose and two times a day.  will pull paper chart to compare and ask MD what to do. Initial call taken by: Rica Records, RN,  November 04, 2009 3:01 PM  Follow-up for Phone Call        paper chart does say plaquenil 200mg  two times a day prescribed by zieminski. Follow-up by: Rica Records, RN,  November 05, 2009 7:44 AM  Additional Follow-up for Phone Call Additional follow up Details #1::        Pt has been taking two times a day and now needs refill.  Yrs ago was done by zieminski, but wants from dr Emanuell Morina now.  [facility] Additional Follow-up by: Rica Records, RN,  November 05, 2009 11:28 AM    Additional Follow-up for Phone Call Additional follow up Details #2::    Rx done.  pt aware Follow-up by: Rica Records, RN,  November 05, 2009 3:07 PM  New/Updated Medications: PLAQUENIL 200 MG TABS (HYDROXYCHLOROQUINE SULFATE) Take 1 tablet by mouth twice a day Prescriptions: PLAQUENIL 200 MG TABS (HYDROXYCHLOROQUINE SULFATE) Take 1 tablet by mouth twice a day  #120 x 0   Entered by:   Rica Records, RN   Authorized by:   Phoebe Sharps MD   Signed by:   Rica Records, RN on 11/05/2009   Method used:   Electronically to        [facility] # [phone]* (retail)       [address]       [place], [place]  [address]       Ph: [phone]       Fax: [phone]   [place]:   [phone]

## 2010-11-22 NOTE — Medication Information (Signed)
Summary: Order for Hair Piece  Order for Hair Piece   Imported By: Laural Benes 05/06/2010 15:23:50  _____________________________________________________________________  External Attachment:    Type:   Image     Comment:   External Document

## 2010-11-22 NOTE — Medication Information (Signed)
Summary: Enrollment/LEAP program/Letairis  Enrollment/LEAP program/Letairis   Imported By: Bubba Hales 08/04/2010 08:21:02  _____________________________________________________________________  External Attachment:    Type:   Image     Comment:   External Document

## 2010-11-22 NOTE — Assessment & Plan Note (Signed)
Summary: 2 week fup per dr mcgowen//ccm   Vital Signs:  Patient profile:   30 year old female Weight:      235 pounds Temp:     98.8 degrees F oral Pulse rate:   92 / minute Pulse rhythm:   irregular BP sitting:   102 / 72  (left arm) Cuff size:   large  Vitals Entered By: Townsend Roger, CMA (September 22, 2010 10:35 AM) CC: f/u on shingles   Primary Care Provider:  Phoebe Sharps MD, Dr. Binnie Rail - Rheum, Dr. [place] Senate - renal, Dr Mar Daring - cards  CC:  f/u on shingles.  History of Present Illness: patient comes in for followup of shingles. Minimal pain. Rash is resolving.  Current Medications (verified): 1)  Plaquenil 200 Mg Tabs (Hydroxychloroquine Sulfate) .... Take 1 Tablet By Mouth Twice A Day 2)  Lisinopril 20 Mg Tabs (Lisinopril) .Marland Kitchen.. 1 By Mouth Two Times A Day 3)  Cellcept 500 Mg Tabs (Mycophenolate Mofetil) .... Take 2 Tabs Two Times A Day 4)  Coreg 25 Mg Tabs (Carvedilol) .... 2 By Mouth Bid 5)  Furosemide 40 Mg Tabs (Furosemide) .Marland Kitchen.. 1 Tab Once Daily 6)  Amlodipine Besylate 5 Mg  Tabs (Amlodipine Besylate) .Marland Kitchen.. 1 By Mouth Every Day 7)  Spironolactone 25 Mg Tabs (Spironolactone) .... Take 1 Tablet By Mouth Once A Day 8)  Prednisone 10 Mg  Tabs (Prednisone) .... Once Daily 9)  Vicodin 5-500 Mg Tabs (Hydrocodone-Acetaminophen) .Marland Kitchen.. 1-2 Tabs By Mouth Q6h As Needed For Pain  Allergies (verified): No Known Drug Allergies  Physical Exam  General:  overweight female in no acute distress. HEENT exam atraumatic, normocephalic. Shingles rash is clearing   Impression & Recommendations:  Problem # 1:  SHINGLES (ICD-053.9)  resolving. No further evaluation necessary.  Problem # 2:  PULMONARY HYPERTENSION, SECONDARY (ICD-416.8)  Complete Medication List: 1)  Plaquenil 200 Mg Tabs (Hydroxychloroquine sulfate) .... Take 1 tablet by mouth twice a day 2)  Lisinopril 20 Mg Tabs (Lisinopril) .Marland Kitchen.. 1 by mouth two times a day 3)  Cellcept 500 Mg Tabs (Mycophenolate  mofetil) .... Take 2 tabs two times a day 4)  Coreg 25 Mg Tabs (Carvedilol) .... 2 by mouth bid 5)  Furosemide 40 Mg Tabs (Furosemide) .Marland Kitchen.. 1 tab once daily 6)  Amlodipine Besylate 5 Mg Tabs (Amlodipine besylate) .Marland Kitchen.. 1 by mouth every day 7)  Spironolactone 25 Mg Tabs (Spironolactone) .... Take 1 tablet by mouth once a day 8)  Prednisone 10 Mg Tabs (Prednisone) .... Once daily 9)  Vicodin 5-500 Mg Tabs (Hydrocodone-acetaminophen) .Marland Kitchen.. 1-2 tabs by mouth q6h as needed for pain 10)  Letairis 5 Mg Tabs (Ambrisentan) .... Take 1 tablet by mouth once a day   Orders Added: 1)  Est. Patient Level II [id number]

## 2010-11-22 NOTE — Consult Note (Signed)
Summary: [facility], [facility]   Imported By: Laural Benes 08/22/2010 12:12:43  _____________________________________________________________________  External Attachment:    Type:   Image     Comment:   External Document

## 2010-11-22 NOTE — Letter (Signed)
Summary: Cardiac Catheterization Instructions- [place], [place]  [id number] [place]   [place], [place] [address]   Phone: [phone]  Fax: [phone]     07/08/2010 MRN: [id number]  [facility] [address] [place], [place]  [address]  Dear Ms. Ogas,   You are scheduled for a Cardiac Catheterization on Friday September,23,11 with Dr. Haroldine Laws.  Please arrive to the 1st floor of the Heart and Vascular Center at [facility] at 10:30am on the day of your procedure. Please do not arrive before 6:30 a.m. Call the Heart and Vascular Center at [phone] if you are unable to make your appointmnet. The Code to get into the parking garage under the building is [place]. Take the elevators to the 1st floor. You must have someone to drive you home. Someone must be with you for the first 24 hours after you arrive home. Please wear clothes that are easy to get on and off and wear slip-on shoes. Do not eat or drink after midnight except water with your medications that morning. Bring all your medications and current insurance cards with you.   __x_ Make sure you take your aspirin.  __x_ You may take ALL of your medications with water that morning.   The usual length of stay after your procedure is 2 to 3 hours. This can vary.  If you have any questions, please call the office at the number listed above.  Dr. Marcello Moores Juvenal Umar/ Joelyn Oms RN

## 2010-11-22 NOTE — Medication Information (Signed)
Summary: Milda Smart approved/[employer]  Letairis approved/[employer]   Imported By: Phillis Knack 08/19/2010 07:07:07  _____________________________________________________________________  External Attachment:    Type:   Image     Comment:   External Document

## 2010-11-22 NOTE — Progress Notes (Signed)
Summary: fu needed following right heart cath showing [place]  Phone Note Outgoing Call   Summary of Call: jen, pls give her appt to see me after right heart cath results. Need 6 min walk test and need to start her on bosentan after that Initial call taken by: Brand Males MD,  July 16, 2010 12:57 PM  Follow-up for Phone Call        Pt scheduled to see MR on 07-28-10 and 6 min walk before. Pt aware of appts. [place] Bing CMA  July 21, 2010 10:50 AM

## 2010-11-22 NOTE — Procedures (Signed)
Summary: Spirometry / [place] [facility]  Spirometry / [place] [facility]   Imported By: Rise Patience 06/29/2010 15:17:40  _____________________________________________________________________  External Attachment:    Type:   Image     Comment:   External Document

## 2010-11-22 NOTE — Assessment & Plan Note (Signed)
Summary: SIX MIN WALK-PULM STRESS TEST  Nurse Visit   Vital Signs:  Patient profile:   30 year old female Pulse rate:   76 / minute BP sitting:   120 / 80  Medications Prior to Update: 1)  Plaquenil 200 Mg Tabs (Hydroxychloroquine Sulfate) .... Take 1 Tablet By Mouth Twice A Day 2)  Lisinopril 20 Mg Tabs (Lisinopril) .Marland Kitchen.. 1 By Mouth Two Times A Day 3)  Cellcept 500 Mg Tabs (Mycophenolate Mofetil) .... Take 2 Tabs Once Daily 4)  Coreg 25 Mg Tabs (Carvedilol) .... 2 By Mouth Bid 5)  Furosemide 20 Mg Tabs (Furosemide) .... Take 1 Tablet By Mouth Once A Day 6)  Amlodipine Besylate 5 Mg  Tabs (Amlodipine Besylate) .Marland Kitchen.. 1 By Mouth Every Day 7)  Spironolactone 25 Mg Tabs (Spironolactone) .... Take 1 Tablet By Mouth Once A Day 8)  Prednisone (Pak) 10 Mg Tabs (Prednisone) .... Take As Directed Taper Dose Pak  Allergies: No Known Drug Allergies  Orders Added: 1)  Pulmonary Stress (6 min walk) [94620]   Six Minute Walk Test Medications taken before test(dose and time): 1)  Plaquenil 200 Mg Tabs (Hydroxychloroquine Sulfate) .... Take 1 Tablet By Mouth Twice A Day 2)  Lisinopril 20 Mg Tabs (Lisinopril) .Marland Kitchen.. 1 By Mouth Two Times A Day 3)  Cellcept 500 Mg Tabs (Mycophenolate Mofetil) .... Take 2 Tabs Once Daily 4)  Coreg 25 Mg Tabs (Carvedilol) .... 2 By Mouth Bid 5)  Furosemide 20 Mg Tabs (Furosemide) .... Take 1 Tablet By Mouth Once A Day 6)  Amlodipine Besylate 5 Mg  Tabs (Amlodipine Besylate) .Marland Kitchen.. 1 By Mouth Every Day 7)  Spironolactone 25 Mg Tabs (Spironolactone) .... Take 1 Tablet By Mouth Once A Day 8)  Prednisone (Pak) 10 Mg Tabs (Prednisone) .... Take As Directed Taper Pt took all meds at 11:00 am today (unknown re: Prednisone but nurse will confirm  meds with pt) Supplemental oxygen during the test: No  Lap counter(place a tick mark inside a square for each lap completed) lap 1 complete  lap 2 complete   lap 3 complete   lap 4 complete  lap 5 complete  lap 6 complete  lap 7  complete   lap 8 complete   lap 9 complete  lap 10 complete   Baseline  BP sitting: 120/ 80 Heart rate: 76 Dyspnea ( Borg scale) .5 Fatigue (Borg scale) .5 SPO2 99  End Of Test  BP sitting: 140/ 90 Heart rate: 146 Dyspnea ( Borg scale) 3 Fatigue (Borg scale) .5 SPO2 93  2 Minutes post  BP sitting: 122/ 82 Heart rate: 85 SPO2 100  Stopped or paused before six minutes? No  Interpretation: Number of laps  10 X 48 meters =   480 meters =    480 meters   Total distance walked in six minutes: 480 meters  Tech ID: Cooper Render, CNA (July 28, 2010 4:24 PM) Tech Comments Pt completed test w/ 0 rest breaks and 1 complaint: pressure on chest which was resolved at 2 mins. post.

## 2010-11-22 NOTE — Assessment & Plan Note (Signed)
Summary: ROV 2 MONTHS///KP   Visit Type:  Follow-up Copy to:  Dr Arlean Hopping Primary Provider/Referring Provider:  Phoebe Sharps MD, Dr. Binnie Rail - Rheum, Dr. Salem Senate - renal, Dr Mar Daring - cards  CC:  Pt here for 2 month follow-up.Marland Kitchen  History of Present Illness: IOv 06/09/2010:   30 year old never smoker, oBese (BMI 37) with hx of 20# weight gain since 2005 AA female with multiorgan lupus (See past med hx for all details).   c/o chest tightness, dyspnea and chest pressure/pain. Insidious onset of symptoms few years ago. All symptoms go together. The chest tighness/pain/pressure is present all over chest but is more so in right breast area. Variable course. On and off symptoms. When recurs symptoms can last days or weeks and can be in remission for many months. DYspnea and chest tightness  brought on by bending over or minimal activities like grabbing something or minimal walking. Dyspnea relieved by sitting down and taking rest while chst pressure is relieved by deep palpation of right chest..  Denies associated wheezing, cough, fevers, hemoptysis, sputum but does report associated insomnia.   In review of her past history I note that she had NICM 25% in 2007 but this resolved later this year. She has seen Dr. Verl Blalock for her chest symptoms and has been reassured that this is not cardiac. Most recent ECHO on Oct 2010 was essentially normal but reports suggest some RV dysfunction. In addition, Dr Arlean Hopping notes from 05/25/2010 indicate ongoing diseaes activity  REC 1. 6 minute walk test 2. FUll PFT 3. CT chest based on FUll PFT  OV June 30, 2010: Here to review test results. No intermin complaints. No new issues. Symptoims still persist. 6 minute walk distance is 523 meters. PFTs showed restriction with low diffusion. Therefore, CT chest done today. Shows essentially normal lung parenchyma. Some trace rt pleural effusion that looks chronic. REC: Rt HEART CATH    July 28, 2010:  Had right heart cath 07/16/2010:. SHows Group 1 and 2 Pulmonary Hypertension. PA mean is 38, PCWP is 18. Transpulmonary gradient is 20. PVR is 3.6 wood units. No interim complaints. Still dyspneic with chest tightness during exertion. 6 minute walk test 480 meters without stopping or symptoms or desaturation. Lowest pulse ox was 93% though. REC: START LETAIRIS   September 06, 2010. ACUTE VISTI. SHINGLES RT CHEST. TREAT with VALTREX.  September 26, 2010: Followup Pulmonary Hypertnesion due to LUPUS. Now completed 2 months of 5mg  letairis. Tolerating it well. Repeat LFT and urine pregnancy test normal. She is practising abstinence. No new complaints. Shingles healing. Rt infra-axillary shingles pain is better but not resolved.She is not sure if dysponea is any better with letairis. Bad days not as bad as before. Stil dyspneic after 5 minutes of walking with chest tightness but thinks shingles is contributing to symptoms. Denies edema, fevre, hemoptysis.         Preventive Screening-Counseling & Management  Alcohol-Tobacco     Smoking Status: never  Caffeine-Diet-Exercise     Does Patient Exercise: no  Current Medications (verified): 1)  Plaquenil 200 Mg Tabs (Hydroxychloroquine Sulfate) .... Take 1 Tablet By Mouth Twice A Day 2)  Lisinopril 20 Mg Tabs (Lisinopril) .Marland Kitchen.. 1 By Mouth Two Times A Day 3)  Cellcept 500 Mg Tabs (Mycophenolate Mofetil) .... Take 2 Tabs Two Times A Day 4)  Coreg 25 Mg Tabs (Carvedilol) .... 2 By Mouth Bid 5)  Furosemide 40 Mg Tabs (Furosemide) .Marland Kitchen.. 1 Tab  Once Daily 6)  Amlodipine Besylate 5 Mg  Tabs (Amlodipine Besylate) .Marland Kitchen.. 1 By Mouth Every Day 7)  Spironolactone 25 Mg Tabs (Spironolactone) .... Take 1 Tablet By Mouth Once A Day 8)  Prednisone 10 Mg  Tabs (Prednisone) .... Once Daily 9)  Vicodin 5-500 Mg Tabs (Hydrocodone-Acetaminophen) .Marland Kitchen.. 1-2 Tabs By Mouth Q6h As Needed For Pain 10)  Letairis 5 Mg Tabs (Ambrisentan) .... Take 1 Tablet By Mouth Once A  Day  Allergies (verified): No Known Drug Allergies  Past History:  Past medical, surgical, family and social histories (including risk factors) reviewed, and no changes noted (except as noted below).  Past Medical History: Reviewed history from 06/09/2010 and no changes required. #s/p COUMADIN THERAPY (ICD-V58.61)  - given March 2007 for NICM ef 25%. Rx discontinuned dec 2007 following normalization of LVEF #CHEST PAIN, HX OF (ICD-V12.50) #PALPITATIONS, HX OF (ICD-V12.50) #TRANSIENT ISCHEMIC ATTACKS, HX OF (ICD-V12.50)....Marland KitchenMarland KitchenDr Doy Mince > Seen by REynolds 01/18/2006 > Fu Sethi Aprol 2007 - not c/w TIA #CARDIOMYOPATHY, SECONDARY (ICD-425.9)....Marland KitchenMarland KitchenDr Mar Daring  - NICM ef 25% and bNP 1000s in March/April 2007. s/p normal left heart cardiac cath  - Right heart cath 01/18/2006:  RA mean 9, RV mean of 13. PA 60/34 with a mean 47. PCWP mean 32   - Normalization of ECHO LVEF in Dec 2007, and OCtober 2010 #RV DYSFUNCTION  > noted on echo 11/19/2008 and 08/09/2009 #HYPERTENSION, UNCONTROLLED (ICD-401.9) #ANEMIA, CHRONIC (ICD-281.9) #ERYTHEMATOSUS, LUPUS (ICD-695.4) #LUPUS NEPHRITIS (ICD-710.0)....Marland KitchenDR Salem Senate  > Bx 2007: Membranous lupus, WHO CLASS 2, and 2gm proteinuria #Hx of Medication Non compliance and complex medication regimen     Past Surgical History: Reviewed history from 08/07/2006 and no changes required. Denies surgical history  Past Pulmonary History:  Pulmonary History: #SLE..................Marland KitchenDr Arlean Hopping and Dr. Salem Senate > diagnosed since 2002. Care under DR Z and [facility] in pasth.  > Dr Rosann Auerbach patient since 2009 > Organs involved: Per hx dermal, nephritis, joints, and Raynaud's > Aug 2011 meds: Cellcept since 2007 but regular since 2009, Prednisone since 2002, Plaquenil since 2002 > Disease activity MArch 2011: Normal C3, C4. ANA 1:2560, DS DNA 78, UA - trace blood, ESR 63  Family History: Reviewed history from 06/09/2010 and no changes required. Mother:  lupus Father: alive and well Sister x 1-healthy Brotherx2 Healthy Ngeative Fx Hx of CAD MGM-rheumatism  Social History: Reviewed history from 11/05/2008 and no changes required. Occupation: retail for [employer] Never Smoked Alcohol use-no Regular exercise-no she has recently joined a gym Drug Use - no  Review of Systems  The patient denies shortness of breath with activity, shortness of breath at rest, productive cough, non-productive cough, coughing up blood, chest pain, irregular heartbeats, acid heartburn, indigestion, loss of appetite, weight change, abdominal pain, difficulty swallowing, sore throat, tooth/dental problems, headaches, nasal congestion/difficulty breathing through nose, sneezing, itching, ear ache, anxiety, depression, hand/feet swelling, joint stiffness or pain, rash, change in color of mucus, and fever.    Vital Signs:  Patient profile:   30 year old female Height:      67 inches Weight:      238 pounds BMI:     37.41 O2 Sat:      94 % on Room air Temp:     97.7 degrees F oral Pulse rate:   71 / minute BP sitting:   108 / 72  (right arm) Cuff size:   large  Vitals Entered By: [place] Bing CMA (September 26, 2010 1:39 PM)  O2 Flow:  Room air CC: Pt here for 2 month follow-up. Comments Medications reviewed with patient Laurel Bing CMA  September 26, 2010 1:40 PM Daytime phone number verified with patient.    Physical Exam  General:  obese.  no acute distress, nontoxic appearing Head:  normocephalic and atraumatic Eyes:  PERRLA/EOM intact; conjunctiva and lids normal. Ears:  TM's intact and clear with normal canals and hearing Mouth:  Teeth, gums and palate normal. Oral mucosa normal. Neck:  Neck supple, no JVD. No masses, thyromegaly or abnormal cervical nodes. Chest Wall:  no deformities or breast masses noted  shingles healed Lungs:  clear without rales or decreased breath sounds. Heart:  regular rhythm, normal rate, no murmurs,  no rubs, no gallops, and accentuated P2 (POSSIBLY).   Abdomen:  Bowel sounds positive; abdomen soft and non-tender without masses, organomegaly, or hernias noted. No hepatosplenomegaly. Msk:  Back normal, normal gait. Muscle strength and tone normal. Pulses:  pulses normal in all 4 extremities Extremities:  no clubbing, cyanosis, edema, or deformity noted Neurologic:  Alert and oriented x 3.gait normal and strength normal.   Skin:  healing ishingles  Psych:  alert and cooperative; normal mood and affect; normal attention span and concentration   MISC. Report  Procedure date:  09/19/2010  Findings:       Tests: (1) Pregnancy Serum, Quant (HCG-QN)   BHCG                      0.21 mIU/mL     0 - 1 Weeks  5 - 50     1 - 2 Weeks  50 - 500     2 - 3 Weeks  100 - 5,000     3 - 4 Weeks  500 - 10,000     4 - 5 Weeks  1,000 - 50,000     5 - 6 Weeks  10,000 - 100,000     6 - 8 Weeks  15,000 - 200,000     8 - 12 Weeks  10,000 - 100,0000   Tests: (2) Hepatic/Liver Function Panel (HEPATIC)   Total Bilirubin      [L]  0.2 mg/dL                   0.3-1.2   Direct Bilirubin          0.0 mg/dL                   0.0-0.3   Alkaline Phosphatase      56 U/L                      39-117   AST                       18 U/L                      0-37   ALT                       18 U/L                      0-35   Total Protein             7.3 g/dL                    6.0-8.3   Albumin  3.8 g/dL                    3.5-5.2  Impression & Recommendations:  Problem # 1:  PULMONARY HYPERTENSION, SECONDARY (ICD-416.8) Assessment Unchanged  subjectively no change in dyspnea but shingles could be interfering. s/p advanced PAH RRx x 2 mothhs. Tolerating letairis well at 5mg . No evidence of pregnancy or toxicity  PLAN we will increase letairis to 10mg  daily as soon as you start yoru 10mg  dose let me know return to see me in 2 months have pregnancy check monthly at time of next viist, do 6  minute walk test and liver function test before you see me avoid pregnancy if you are not abstinent, please contact Dr. Leodis Rains for intrauterine device  Orders: Est. Patient Level III [id number]  Medications Added to Medication List This Visit: 1)  Letairis 10 Mg Tabs (Ambrisentan) .... Take 1 tablet daily  Patient Instructions: 1)  we will increase letairis to 10mg  daily 2)  as soon as you start yoru 10mg  dose let me know 3)  return to see me in 2 months 4)  have pregnancy check monthly 5)  at time of next viist, do 6 minute walk test and liver function test before you see me 6)  avoid pregnancy 7)  if you are not abstinent, please contact Dr. Leodis Rains for intrauterine device Prescriptions: LETAIRIS 10 MG TABS (AMBRISENTAN) take 1 tablet daily  #30 x 6   Entered and Authorized by:   Brand Males MD   Signed by:   Brand Males MD on 09/26/2010   Method used:   Print then Give to Patient   [id number]

## 2010-11-22 NOTE — Assessment & Plan Note (Signed)
Vital Signs:  Patient profile:   30 year old female Height:      67 inches (170.18 cm) Weight:      236 pounds (107.27 kg) O2 Sat:      99 % on Room air Temp:     98.7 degrees F (37.06 degrees C) oral Pulse rate:   110 / minute BP sitting:   112 / 70  (left arm) Cuff size:   large  Vitals Entered By: Gardenia Phlegm RMA (September 07, 2010 10:54 AM)  O2 Flow:  Room air CC: Possible shingles X3 days- underneath breasts around to right side/ CF Is Patient Diabetic? No   History of Present Illness: 30 y/o AAF presents with painful rash. Had onset of pain in right axillary/chest wall area 3d/a, then the next day developed a rash--little blisters--and the pain has intensified.  She initially presented to her pulmonologist b/c she was worried that her CP was related to her pulm htn, but when she told him that a rash came up he dx'd her with shingles and gave her a rx for valtrex.  She filled the valtrex last night and tool her first dose last hs (about 48-72h after onset of her pain). She has lupus as well, and is on chronic immunosuppressive therapy with cellcept and prednisone 10mg  once daily.   She denies past hx of shingles.   ROS: no fever, no cough, no malaise.  Current Medications (verified): 1)  Plaquenil 200 Mg Tabs (Hydroxychloroquine Sulfate) .... Take 1 Tablet By Mouth Twice A Day 2)  Lisinopril 20 Mg Tabs (Lisinopril) .Marland Kitchen.. 1 By Mouth Two Times A Day 3)  Cellcept 500 Mg Tabs (Mycophenolate Mofetil) .... Take 2 Tabs Two Times A Day 4)  Coreg 25 Mg Tabs (Carvedilol) .... 2 By Mouth Bid 5)  Furosemide 40 Mg Tabs (Furosemide) .Marland Kitchen.. 1 Tab Once Daily 6)  Amlodipine Besylate 5 Mg  Tabs (Amlodipine Besylate) .Marland Kitchen.. 1 By Mouth Every Day 7)  Spironolactone 25 Mg Tabs (Spironolactone) .... Take 1 Tablet By Mouth Once A Day 8)  Prednisone (Pak) 10 Mg Tabs (Prednisone) .... Take 1 Tablet By Mouth Once A Day 9)  Valtrex 1 Gm Tabs (Valacyclovir Hcl) .... Three Times Daily After Meals 10)   Prednisone 20 Mg Tabs (Prednisone) .... 2 Tabs By Mouth Once Daily X 5d, Then 1 Tab By Mouth Once Daily X 5d, Then Resume Your Usual Maintenance Dosing 11)  Bactroban 2 % Oint (Mupirocin) .... Apply To Affected Area Three Times A Day X 10d 12)  Vicodin 5-500 Mg Tabs (Hydrocodone-Acetaminophen) .Marland Kitchen.. 1-2 Tabs By Mouth Q6h As Needed For Pain  Allergies (verified): No Known Drug Allergies  Past History:  Past medical, surgical, family and social histories (including risk factors) reviewed, and no changes noted (except as noted below).  Past Medical History: Reviewed history from 06/09/2010 and no changes required. #s/p COUMADIN THERAPY (ICD-V58.61)  - given March 2007 for NICM ef 25%. Rx discontinuned dec 2007 following normalization of LVEF #CHEST PAIN, HX OF (ICD-V12.50) #PALPITATIONS, HX OF (ICD-V12.50) #TRANSIENT ISCHEMIC ATTACKS, HX OF (ICD-V12.50)....Marland KitchenMarland KitchenDr Doy Mince > Seen by REynolds 01/18/2006 > Fu Sethi Aprol 2007 - not c/w TIA #CARDIOMYOPATHY, SECONDARY (ICD-425.9)....Marland KitchenMarland KitchenDr Mar Daring  - NICM ef 25% and bNP 1000s in March/April 2007. s/p normal left heart cardiac cath  - Right heart cath 01/18/2006:  RA mean 9, RV mean of 13. PA 60/34 with a mean 47. PCWP mean 32   - Normalization of ECHO LVEF in Dec 2007, and  OCtober 2010 #RV DYSFUNCTION  > noted on echo 11/19/2008 and 08/09/2009 #HYPERTENSION, UNCONTROLLED (ICD-401.9) #ANEMIA, CHRONIC (ICD-281.9) #ERYTHEMATOSUS, LUPUS (ICD-695.4) #LUPUS NEPHRITIS (ICD-710.0)....Marland KitchenDR Salem Senate  > Bx 2007: Membranous lupus, WHO CLASS 2, and 2gm proteinuria #Hx of Medication Non compliance and complex medication regimen     Past Surgical History: Reviewed history from 08/07/2006 and no changes required. Denies surgical history  Family History: Reviewed history from 06/09/2010 and no changes required. Mother: lupus Father: alive and well Sister x 1-healthy Brotherx2 Healthy Ngeative Fx Hx of CAD MGM-rheumatism  Social History: Reviewed  history from 11/05/2008 and no changes required. Occupation: retail for [employer] Never Smoked Alcohol use-no Regular exercise-no she has recently joined a gym Drug Use - no  Review of Systems       see HPI  Physical Exam  General:  VS: noted, all normal. Gen: Alert, well appearing, oriented x 4. Skin: Right upper abdomen, right axilla, and right CVA area with vesicular rash on mildly erythematous base.  All vesicles look intact.  The rash is in a dermatomal distribution, does not cross midline.   Impression & Recommendations:  Problem # 1:  SHINGLES (ICD-053.9) Continue valtrex as rx'd.  Will increase prednisone: take 40mg  once daily x 5d, then 20mg  once daily x 5d, then resume usual 10mg  once daily dosing. Vicodin 5/500, 1-2 q6h as needed for pain, #30, RF x 1. I warned pt of possible bacterial superinfection of the lesions as they rupture, gave rx for bactroban to have on hand and start at first sign of this.   Recheck in 2 wks.  Problem # 2:  PERSONAL HISTORY OF IMMUNOSUPPRESSION THERAPY (ICD-V87.46) This condition may make her disease course atypical/prolonged.  Discussed this with patient today. She will continue all current meds.  Complete Medication List: 1)  Plaquenil 200 Mg Tabs (Hydroxychloroquine sulfate) .... Take 1 tablet by mouth twice a day 2)  Lisinopril 20 Mg Tabs (Lisinopril) .Marland Kitchen.. 1 by mouth two times a day 3)  Cellcept 500 Mg Tabs (Mycophenolate mofetil) .... Take 2 tabs two times a day 4)  Coreg 25 Mg Tabs (Carvedilol) .... 2 by mouth bid 5)  Furosemide 40 Mg Tabs (Furosemide) .Marland Kitchen.. 1 tab once daily 6)  Amlodipine Besylate 5 Mg Tabs (Amlodipine besylate) .Marland Kitchen.. 1 by mouth every day 7)  Spironolactone 25 Mg Tabs (Spironolactone) .... Take 1 tablet by mouth once a day 8)  Prednisone (pak) 10 Mg Tabs (Prednisone) .... Take 1 tablet by mouth once a day 9)  Valtrex 1 Gm Tabs (Valacyclovir hcl) .... Three times daily after meals 10)  Prednisone 20 Mg Tabs  (Prednisone) .... 2 tabs by mouth once daily x 5d, then 1 tab by mouth once daily x 5d, then resume your usual maintenance dosing 11)  Bactroban 2 % Oint (Mupirocin) .... Apply to affected area three times a day x 10d 12)  Vicodin 5-500 Mg Tabs (Hydrocodone-acetaminophen) .Marland Kitchen.. 1-2 tabs by mouth q6h as needed for pain  Patient Instructions: 1)  Please schedule an appointment with your primary doctor in  2 wks.  Prescriptions: VICODIN 5-500 MG TABS (HYDROCODONE-ACETAMINOPHEN) 1-2 tabs by mouth q6h as needed for pain  #30 x 1   Entered and Authorized by:   Kelle Darting M.D.   Signed by:   Kelle Darting M.D. on 09/07/2010   Method used:   Print then Give to Patient   RxID:   [id number] BACTROBAN 2 % OINT (MUPIROCIN) apply to affected area three times a  day x 10d  #1 x 1   Entered and Authorized by:   Kelle Darting M.D.   Signed by:   Kelle Darting M.D. on 09/07/2010   Method used:   Electronically to        [facility] [address] # [phone]* (retail)       [address]       [place], [place]  [address]       Ph: [phone]       Fax: [phone]   RxID:   [id number] PREDNISONE 20 MG TABS (PREDNISONE) 2 tabs by mouth once daily x 5d, then 1 tab by mouth once daily x 5d, then resume your usual maintenance dosing  #15 x 0   Entered and Authorized by:   Kelle Darting M.D.   Signed by:   Kelle Darting M.D. on 09/07/2010   Method used:   Electronically to        [facility] [address] # [phone]* (retail)       [address]       [place], [place]  [address]       Ph: [phone]       Fax: [phone]   RxID:   [id number]    Orders Added: 1)  Est. Patient Level IV [id number]

## 2010-11-22 NOTE — Progress Notes (Signed)
Summary: prior authorization for letaris approved  Phone Note From Other Clinic Call back at [phone] ex [phone]   Caller: Patient Call For: ramaswamy Caller: [employer] ( kim Call For: ramaswamy Summary of Call: calling about prior authorization for letairis. Initial call taken by: Gustavus Bryant,  August 10, 2010 11:11 AM  Follow-up for Phone Call        [facility] with Maudie Mercury from [place].  Jinny Blossom Reynolds LPN  October 19, [id number] 2:42 PM   kim returned call. (same #). Cooper Render, CNA  August 10, 2010 3:28 PM  MR has the form to complete. [place] Bing CMA  August 10, 2010 4:46 PM   Additional Follow-up for Phone Call Additional follow up Details #1::        LMOMTCB to inform Maudie Mercury MR has the form to complete. Raymondo Band RN  August 10, 2010 5:20 PM  Maudie Mercury returned call.  She was checking on the status of the prior auth form for letaris.  I advised Maudie Mercury MR does have this form to complete.  WIll forward message to him so he is aware they are waiting on it.   Additional Follow-up by: Raymondo Band RN,  August 10, 2010 5:27 PM    Additional Follow-up for Phone Call Additional follow up Details #2::    done. form wiht [employer] Follow-up by: Brand Males MD,  August 15, 2010 3:42 PM  Additional Follow-up for Phone Call Additional follow up Details #3:: Details for Additional Follow-up Action Taken: Anderson Malta do you have this information? Iran Planas CMA  August 15, 2010 3:49 PM   I have the form and I called [phone] and did PA over the phone per the form that MR completed. I have sent form to be scanned. Will await approval/denial via fax. Rep states will take 24-48 hrs.  Pt aware.[place] Bing CMA  August 15, 2010 5:06 PM   Letaris approved thru 08-09-11. pt and pharmacy aware. [place] Bing CMA  August 16, 2010 3:17 PM

## 2010-11-22 NOTE — Assessment & Plan Note (Signed)
Summary: rov with 6 min walk//jrc   Copy to:  Dr Arlean Hopping Primary Provider/Referring Provider:  Phoebe Sharps MD, Dr. Binnie Rail - Rheum, Dr. Salem Senate - renal, Dr Mar Daring - cards  CC:  Pt here for follow-up to discuss cath results and to start on bosentan. Marland Kitchen  History of Present Illness: IOv 06/09/2010:   30 year old never smoker, oBese (BMI 37) with hx of 20# weight gain since 2005 AA female with multiorgan lupus (See past med hx for all details).   c/o chest tightness, dyspnea and chest pressure/pain. Insidious onset of symptoms few years ago. All symptoms go together. The chest tighness/pain/pressure is present all over chest but is more so in right breast area. Variable course. On and off symptoms. When recurs symptoms can last days or weeks and can be in remission for many months. DYspnea and chest tightness  brought on by bending over or minimal activities like grabbing something or minimal walking. Dyspnea relieved by sitting down and taking rest while chst pressure is relieved by deep palpation of right chest..  Denies associated wheezing, cough, fevers, hemoptysis, sputum but does report associated insomnia.   In review of her past history I note that she had NICM 25% in 2007 but this resolved later this year. She has seen Dr. Verl Blalock for her chest symptoms and has been reassured that this is not cardiac. Most recent ECHO on Oct 2010 was essentially normal but reports suggest some RV dysfunction. In addition, Dr Arlean Hopping notes from 05/25/2010 indicate ongoing diseaes activity  REC 1. 6 minute walk test 2. FUll PFT 3. CT chest based on FUll PFT  OV June 30, 2010: Here to review test results. No intermin complaints. No new issues. Symptoims still persist. 6 minute walk distance is 523 meters. PFTs showed restriction with low diffusion. Therefore, CT chest done today. Shows essentially normal lung parenchyma. Some trace rt pleural effusion that looks chronic. REC: Rt HEART  CATH    July 28, 2010: Had right heart cath 07/16/2010:. SHows Group 1 and 2 Pulmonary Hypertension. PA mean is 38, PCWP is 18. Transpulmonary gradient is 20. PVR is 3.6 wood units. No interim complaints. Still dyspneic with chest tightness during exertion. 6 minute walk test 480 meters without stopping or symptoms or desaturation. Lowest pulse ox was 93% though.    Preventive Screening-Counseling & Management  Alcohol-Tobacco     Smoking Status: never  Current Medications (verified): 1)  Plaquenil 200 Mg Tabs (Hydroxychloroquine Sulfate) .... Take 1 Tablet By Mouth Twice A Day 2)  Lisinopril 20 Mg Tabs (Lisinopril) .Marland Kitchen.. 1 By Mouth Two Times A Day 3)  Cellcept 500 Mg Tabs (Mycophenolate Mofetil) .... Take 2 Tabs Once Daily 4)  Coreg 25 Mg Tabs (Carvedilol) .... 2 By Mouth Bid 5)  Furosemide 20 Mg Tabs (Furosemide) .... Take 1 Tablet By Mouth Once A Day 6)  Amlodipine Besylate 5 Mg  Tabs (Amlodipine Besylate) .Marland Kitchen.. 1 By Mouth Every Day 7)  Spironolactone 25 Mg Tabs (Spironolactone) .... Take 1 Tablet By Mouth Once A Day 8)  Prednisone (Pak) 10 Mg Tabs (Prednisone) .... Take 1 Tablet By Mouth Once A Day  Allergies (verified): No Known Drug Allergies  Past History:  Past medical, surgical, family and social histories (including risk factors) reviewed, and no changes noted (except as noted below).  Past Medical History: Reviewed history from 06/09/2010 and no changes required. #s/p COUMADIN THERAPY (ICD-V58.61)  - given March 2007 for NICM ef 25%. Rx  discontinuned dec 2007 following normalization of LVEF #CHEST PAIN, HX OF (ICD-V12.50) #PALPITATIONS, HX OF (ICD-V12.50) #TRANSIENT ISCHEMIC ATTACKS, HX OF (ICD-V12.50)....Marland KitchenMarland KitchenDr Doy Mince > Seen by REynolds 01/18/2006 > Fu Sethi Aprol 2007 - not c/w TIA #CARDIOMYOPATHY, SECONDARY (ICD-425.9)....Marland KitchenMarland KitchenDr Mar Daring  - NICM ef 25% and bNP 1000s in March/April 2007. s/p normal left heart cardiac cath  - Right heart cath 01/18/2006:  RA mean 9, RV  mean of 13. PA 60/34 with a mean 47. PCWP mean 32   - Normalization of ECHO LVEF in Dec 2007, and OCtober 2010 #RV DYSFUNCTION  > noted on echo 11/19/2008 and 08/09/2009 #HYPERTENSION, UNCONTROLLED (ICD-401.9) #ANEMIA, CHRONIC (ICD-281.9) #ERYTHEMATOSUS, LUPUS (ICD-695.4) #LUPUS NEPHRITIS (ICD-710.0)....Marland KitchenDR Salem Senate  > Bx 2007: Membranous lupus, WHO CLASS 2, and 2gm proteinuria #Hx of Medication Non compliance and complex medication regimen     Past Surgical History: Reviewed history from 08/07/2006 and no changes required. Denies surgical history  Past Pulmonary History:  Pulmonary History: #SLE..................Marland KitchenDr Arlean Hopping and Dr. Salem Senate > diagnosed since 2002. Care under DR Z and [facility] in pasth.  > Dr Rosann Auerbach patient since 2009 > Organs involved: Per hx dermal, nephritis, joints, and Raynaud's > Aug 2011 meds: Cellcept since 2007 but regular since 2009, Prednisone since 2002, Plaquenil since 2002 > Disease activity MArch 2011: Normal C3, C4. ANA 1:2560, DS DNA 78, UA - trace blood, ESR 63  Family History: Reviewed history from 06/09/2010 and no changes required. Mother: lupus Father: alive and well Sister x 1-healthy Brotherx2 Healthy Ngeative Fx Hx of CAD MGM-rheumatism  Social History: Reviewed history from 11/05/2008 and no changes required. Occupation: retail for [employer] Never Smoked Alcohol use-no Regular exercise-no she has recently joined a gym Drug Use - no  Review of Systems       The patient complains of shortness of breath at rest and chest pain.  The patient denies shortness of breath with activity, productive cough, non-productive cough, coughing up blood, irregular heartbeats, acid heartburn, indigestion, loss of appetite, weight change, abdominal pain, difficulty swallowing, sore throat, tooth/dental problems, headaches, nasal congestion/difficulty breathing through nose, sneezing, itching, ear ache, anxiety, depression, hand/feet  swelling, joint stiffness or pain, rash, change in color of mucus, and fever.    Vital Signs:  Patient profile:   30 year old female Height:      67 inches Weight:      240 pounds BMI:     37.73 O2 Sat:      100 % on Room air Pulse rate:   85 / minute BP sitting:   122 / 82  (right arm) Cuff size:   regular  Vitals Entered By: [place] Bing CMA (July 28, 2010 4:21 PM)  O2 Flow:  Room air CC: Pt here for follow-up to discuss cath results and to start on bosentan.  Comments Medications reviewed with patient [place] Bing CMA  July 28, 2010 4:23 PM Daytime phone number verified with patient.    Physical Exam  General:  obese.  no acute distress, nontoxic appearing Head:  normocephalic and atraumatic Eyes:  PERRLA/EOM intact; conjunctiva and lids normal. Ears:  TM's intact and clear with normal canals and hearing Mouth:  Teeth, gums and palate normal. Oral mucosa normal. Neck:  Neck supple, no JVD. No masses, thyromegaly or abnormal cervical nodes. Chest Wall:  no deformities or breast masses noted Lungs:  clear without rales or decreased breath sounds. Heart:  regular rhythm, normal rate, no murmurs, no rubs, no gallops, and accentuated  P2 (POSSIBLY).   Abdomen:  Bowel sounds positive; abdomen soft and non-tender without masses, organomegaly, or hernias noted. No hepatosplenomegaly. Msk:  Back normal, normal gait. Muscle strength and tone normal. Pulses:  pulses normal in all 4 extremities Extremities:  no clubbing, cyanosis, edema, or deformity noted Neurologic:  Alert and oriented x 3.gait normal and strength normal.   Skin:  dry skin lesions in face Psych:  alert and cooperative; normal mood and affect; normal attention span and concentration   MISC. Report  Procedure date:  07/16/2010  Findings:      rt heart cath - see hpi  Impression & Recommendations:  Problem # 1:  PULMONARY HYPERTENSION, SECONDARY (ICD-416.8) Assessment New Evidence iof Group  1 PAH due to Lupus present (transpulm gradient > 20, PVR >3). Some element of diastolic dysfn related group 2 PAH present - PCWP 18 and known priopr CHF and obese. Will benefit from [place]. Side effects of LFT and edema warned. Advised never to get pregnant while on drug - she agreed. Will check LFTs, Serum pregnancy. Will follow with 49mwd and then over 4-8 weeks increse dose   Orders: T- * Misc. Laboratory test [phone]) T- * Misc. Laboratory test [phone]) TLB-Hepatic/Liver Function Pnl (80076-HEPATIC) TLB-BNP (B-Natriuretic Peptide) (83880-BNPR) TLB-Preg Serum Quant (B-hCG) (84702-HCG-QN) Est. Patient Level III [id number])  Medications Added to Medication List This Visit: 1)  Prednisone (pak) 10 Mg Tabs (Prednisone) .... Take 1 tablet by mouth once a day 2)  Letairis 5 Mg Tabs (Ambrisentan) .... Take 1 tablet daily  Patient Instructions: 1)  pleae have blood work and pregnancy test 2)  depending on results we might have to start lasix 3)  start letairis 5mg  per day 4)  you cannot be pregnant while on this drug 5)  return in 2 months Prescriptions: LETAIRIS 5 MG TABS (AMBRISENTAN) take 1 tablet daily  #30 x 6   Entered and Authorized by:   Brand Males MD   Signed by:   Brand Males MD on 07/28/2010   Method used:   Print then Give to Patient   RxID:   [id number]

## 2010-11-22 NOTE — Assessment & Plan Note (Signed)
Summary: rov after chest ct   Visit Type:  Follow-up Copy to:  Dr Arlean Hopping Primary Provider/Referring Provider:  Phoebe Sharps MD, Dr. Binnie Rail - Rheum, Dr. Salem Senate - renal, Dr Mar Daring - cards  CC:  Pt here to discuss CT results. .  History of Present Illness: IOv 06/09/2010:   30 year old never smoker, oBese (BMI 37) with hx of 20# weight gain since 2005 AA female with multiorgan lupus (See past med hx for all details).   c/o chest tightness, dyspnea and chest pressure/pain. Insidious onset of symptoms few years ago. All symptoms go together. The chest tighness/pain/pressure is present all over chest but is more so in right breast area. Variable course. On and off symptoms. When recurs symptoms can last days or weeks and can be in remission for many months. DYspnea and chest tightness  brought on by bending over or minimal activities like grabbing something or minimal walking. Dyspnea relieved by sitting down and taking rest while chst pressure is relieved by deep palpation of right chest..  Denies associated wheezing, cough, fevers, hemoptysis, sputum but does report associated insomnia.   In review of her past history I note that she had NICM 25% in 2007 but this resolved later this year. She has seen Dr. Verl Blalock for her chest symptoms and has been reassured that this is not cardiac. Most recent ECHO on Oct 2010 was essentially normal but reports suggest some RV dysfunction. In addition, Dr Arlean Hopping notes from 05/25/2010 indicate ongoing diseaes activity  REC 1. 6 minute walk test 2. FUll PFT 3. CT chest based on FUll PFT  OV June 30, 2010: Here to review test results. No intermin complaints. No new issues. Symptoims still persist. 6 minute walk distance is 523 meters. PFTs showed restriction with low diffusion. Therefore, CT chest done today. Shows essentially normal lung parenchyma. Some trace rt pleural effusion that looks chronic.   Preventive Screening-Counseling &  Management  Alcohol-Tobacco     Smoking Status: never  Current Medications (verified): 1)  Plaquenil 200 Mg Tabs (Hydroxychloroquine Sulfate) .... Take 1 Tablet By Mouth Twice A Day 2)  Lisinopril 20 Mg Tabs (Lisinopril) .Marland Kitchen.. 1 By Mouth Two Times A Day 3)  Cellcept 500 Mg Tabs (Mycophenolate Mofetil) .... Take 2 Tabs Once Daily 4)  Coreg 25 Mg Tabs (Carvedilol) .... 2 By Mouth Bid 5)  Furosemide 20 Mg Tabs (Furosemide) .... Take 1 Tablet By Mouth Once A Day 6)  Amlodipine Besylate 5 Mg  Tabs (Amlodipine Besylate) .Marland Kitchen.. 1 By Mouth Every Day 7)  Spironolactone 25 Mg Tabs (Spironolactone) .... Take 1 Tablet By Mouth Once A Day 8)  Prednisone (Pak) 10 Mg Tabs (Prednisone) .... Take As Directed Taper Dose Pak  Allergies (verified): No Known Drug Allergies  Past History:  Past medical, surgical, family and social histories (including risk factors) reviewed, and no changes noted (except as noted below).  Past Medical History: Reviewed history from 06/09/2010 and no changes required. #s/p COUMADIN THERAPY (ICD-V58.61)  - given March 2007 for NICM ef 25%. Rx discontinuned dec 2007 following normalization of LVEF #CHEST PAIN, HX OF (ICD-V12.50) #PALPITATIONS, HX OF (ICD-V12.50) #TRANSIENT ISCHEMIC ATTACKS, HX OF (ICD-V12.50)....Marland KitchenMarland KitchenDr Doy Mince > Seen by REynolds 01/18/2006 > Fu Sethi Aprol 2007 - not c/w TIA #CARDIOMYOPATHY, SECONDARY (ICD-425.9)....Marland KitchenMarland KitchenDr Mar Daring  - NICM ef 25% and bNP 1000s in March/April 2007. s/p normal left heart cardiac cath  - Right heart cath 01/18/2006:  RA mean 9, RV mean of 13.  PA 60/34 with a mean 47. PCWP mean 32   - Normalization of ECHO LVEF in Dec 2007, and OCtober 2010 #RV DYSFUNCTION  > noted on echo 11/19/2008 and 08/09/2009 #HYPERTENSION, UNCONTROLLED (ICD-401.9) #ANEMIA, CHRONIC (ICD-281.9) #ERYTHEMATOSUS, LUPUS (ICD-695.4) #LUPUS NEPHRITIS (ICD-710.0)....Marland KitchenDR Salem Senate  > Bx 2007: Membranous lupus, WHO CLASS 2, and 2gm proteinuria #Hx of Medication Non  compliance and complex medication regimen     Past Surgical History: Reviewed history from 08/07/2006 and no changes required. Denies surgical history  Past Pulmonary History:  Pulmonary History: #SLE..................Marland KitchenDr Arlean Hopping and Dr. Salem Senate > diagnosed since 2002. Care under DR Z and [facility] in [place].  > Dr Rosann Auerbach patient since 2009 > Organs involved: Per hx dermal, nephritis, joints, and Raynaud's > Aug 2011 meds: Cellcept since 2007 but regular since 2009, Prednisone since 2002, Plaquenil since 2002 > Disease activity MArch 2011: Normal C3, C4. ANA 1:2560, DS DNA 78, UA - trace blood, ESR 63  Family History: Reviewed history from 06/09/2010 and no changes required. Mother: lupus Father: alive and well Sister x 1-healthy Brotherx2 Healthy Ngeative Fx Hx of CAD MGM-rheumatism  Social History: Reviewed history from 11/05/2008 and no changes required. Occupation: retail for [employer] Never Smoked Alcohol use-no Regular exercise-no she has recently joined a gym Drug Use - no  Review of Systems       The patient complains of shortness of breath with activity.  The patient denies shortness of breath at rest, productive cough, non-productive cough, coughing up blood, chest pain, irregular heartbeats, acid heartburn, indigestion, loss of appetite, weight change, abdominal pain, difficulty swallowing, sore throat, tooth/dental problems, headaches, nasal congestion/difficulty breathing through nose, sneezing, itching, ear ache, anxiety, depression, hand/feet swelling, joint stiffness or pain, rash, change in color of mucus, and fever.         fatigue  Vital Signs:  Patient profile:   30 year old female Height:      67 inches Weight:      238.13 pounds O2 Sat:      97 % on Room air Temp:     99.2 degrees F oral Pulse rate:   111 / minute BP sitting:   108 / 88  (right arm) Cuff size:   regular  Vitals Entered By: [place] Bing CMA (June 30, 2010 1:32  PM)  O2 Flow:  Room air CC: Pt here to discuss CT results.  Comments Medications reviewed with patient [place] Bing CMA  June 30, 2010 1:37 PM Daytime phone number verified with patient.    Physical Exam  General:  obese.  no acute distress, nontoxic appearing Head:  normocephalic and atraumatic Eyes:  PERRLA/EOM intact; conjunctiva and lids normal. Ears:  TM's intact and clear with normal canals and hearing Mouth:  Teeth, gums and palate normal. Oral mucosa normal. Neck:  Neck supple, no JVD. No masses, thyromegaly or abnormal cervical nodes. Chest Wall:  no deformities or breast masses noted Lungs:  clear without rales or decreased breath sounds. Heart:  regular rhythm, normal rate, no murmurs, no rubs, no gallops, and accentuated P2 (POSSIBLY).   Abdomen:  Bowel sounds positive; abdomen soft and non-tender without masses, organomegaly, or hernias noted. No hepatosplenomegaly. Msk:  Back normal, normal gait. Muscle strength and tone normal. Pulses:  pulses normal in all 4 extremities Extremities:  no clubbing, cyanosis, edema, or deformity noted Neurologic:  Alert and oriented x 3.gait normal and strength normal.   Skin:  dry skin lesions in face Psych:  alert and cooperative; normal mood and affect; normal attention span and concentration   Impression & Recommendations:  Problem # 1:  SHORTNESS OF BREATH (SOB) (ICD-786.05) Assessment Unchanged  This is a obese 30 year old female with multiorgan lupus and prir NICM ef 25% in2007. Last echo in oct 2010 showed normal ef but some element of RV dysfuntion. Currently hx suggests ongoing lupus activity atleast as of march 2011. EXam is significant for  obesity and possibly a LOUD P2 suggestive of pulm htn.   DDx includes lupus involvement in lung (pleuritis, pneumonitis, pulm htn from lupus, ), complications of immunsupporessants, obesity and deconditioning.  6 minute walk distance is 623 meters. Full PFTs show restricton  with low diffusion. CT chest does not show lung parenchymal abnormalities  She needs Rt heart cath to ensure no pulmonary hypertension due to lupus esp given ECHO findings of RV dysfn.  Will forward to Dr. Mar Daring and coordinate  Orders: Est. Patient Level II [phone])  Other Orders: Admin 1st Vaccine [id number]) Flu Vaccine 17yrs + [id number])  Patient Instructions: 1)  Have flu shot 2)  I will talk to Dr. Verl Blalock and have a right heart cath 3)  I will call you back with result  Flu Vaccine Consent Questions     Do you have a history of severe allergic reactions to this vaccine? no    Any prior history of allergic reactions to egg and/or gelatin? no    Do you have a sensitivity to the preservative Thimersol? no    Do you have a past history of Guillan-Barre Syndrome? no    Do you currently have an acute febrile illness? no    Have you ever had a severe reaction to latex? no    Vaccine information given and explained to patient? yes    Are you currently pregnant? no    Lot Number:[id number]   Exp Date:04/22/2011   Site Given  Left Deltoid IMbflu  [place] Bing CMA  June 30, 2010 2:01 PM  Appended Document: rov after chest ct Hi Tom, I think she needs a right heart cath ? IF you are ok with the plan please let me know. Will you be doing the right heart cath or Dan Bensimohn ?

## 2010-11-22 NOTE — Progress Notes (Signed)
Summary: letaris lab question  Phone Note Call from Patient Call back at Home Phone [phone]   Caller: Patient Call For: ramaswamy Summary of Call: pt has questions re: labs that she is to do.  Initial call taken by: Cooper Render, CNA,  September 16, 2010 1:41 PM  Follow-up for Phone Call        LMTCBx1. I think pt is talikng about hepatic panel for PAH. [place] Bing CMA  September 16, 2010 1:58 PM  pt returned call, she states that she is unsure how to proceed with her monthly labs re: lataris - pregancy and hepatic.  pt to come in 11.28.11 for lab draw.  will sign off on msg and forward to [place] to place as standing order and to print off schedule to mail to patient.  home address has been verified. Parke Poisson CNA/MA  September 16, 2010 2:56 PM

## 2010-11-22 NOTE — Medication Information (Signed)
Summary: Letairis/[facility]  Letairis/[facility]   Imported By: Phillis Knack 08/18/2010 09:09:40  _____________________________________________________________________  External Attachment:    Type:   Image     Comment:   External Document

## 2010-11-22 NOTE — Cardiovascular Report (Signed)
Summary: Pre Cath Orders   Pre Cath Orders   Imported By: Sallee Provencal 08/15/2010 16:04:48  _____________________________________________________________________  External Attachment:    Type:   Image     Comment:   External Document

## 2010-11-22 NOTE — Letter (Signed)
Summary: [facility] Office Note   [facility] Office Note   Imported By: Sallee Provencal 07/04/2010 12:08:04  _____________________________________________________________________  External Attachment:    Type:   Image     Comment:   External Document  Appended Document: needs right heart cath Talked with patient scheduled cath for Friday 07/15/10 with Dr. Haroldine Laws.  Pt will come to office on 9/20 for lab work.  She will receive written instructions at that time.  I will also forward this to triage. Horton Chin RN  Appended Document: needs right heart cath Called and spoke with Blardin at [facility] and pre cert is not required for any outpt proc including right heart cath.

## 2010-11-22 NOTE — Miscellaneous (Signed)
Summary: Orders Update pft charges  Clinical Lists Changes  Orders: Added new Service order of Carbon Monoxide diffusing w/capacity (94720) - Signed Added new Service order of Lung Volumes (94240) - Signed Added new Service order of Spirometry (Pre & Post) (94060) - Signed 

## 2010-11-22 NOTE — Progress Notes (Signed)
Summary: chest tightness  Phone Note Call from Patient Call back at Home Phone [phone]   Caller: Patient Call For: ramaswamy Reason for Call: Talk to Nurse Summary of Call: Patient callling w/ c/o slight chest tightness since starting letariris.  Pain has gotten worse for the past 2 days, right side chest pain mainly.  Requesting to speak to nurse.   Initial call taken by: Mateo Flow,  September 06, 2010 8:28 AM  Follow-up for Phone Call        Called and spoke with pt.  She states that she started letairis approx 3 wks ago and for the past 2 days has noticed sharp pain in rt side chest.  She states that the pain "is kind of constant".  She states no changes in her breathing.  I advised that she needs to contact cards asap- sees Dr Verl Blalock, and go to the ER sooner if needed.  I will forward the msg to MR to let him know.  Pls advise recs, thanks! Follow-up by: Tilden Dome,  September 06, 2010 9:53 AM  Additional Follow-up for Phone Call Additional follow up Details #1::        spoke to patient:RT infraaxilliary chest pain x 4 days, insidious onset, progressive, markedly worse today, pleurtic and exrertion in nature. Associated dyspnea +. No fever. No hemoptysis. No wheeze. Pain is moderate. My assessment is we need to rule out PE today. GET VQ SCAN TODAY. If we cannot, do D-dimer stat. If d-dimer high then can rush in a CT. Even though normal creatinine, she has hx of lupus nephritis per chart so would like to avoid CT angio Additional Follow-up by: Brand Males MD,  September 06, 2010 2:46 PM  New Problems: CHEST PAIN, PLEURITIC [phone])   Additional Follow-up for Phone Call Additional follow up Details #2::    MR spoke with me requesting that the VQ be set up today, but that if the VQ cannot be set up then it is okay to change to a CT Angio Chest - if the radiologist pushes for the CT Angio rather than the requested VQ, okay to have them call MR on his pager.  called spoke  with Suanne Marker, [facility] and informed her of MR's request(s).  Rhonda requests phone message be routed to her.  per phone note, MR has already spoken with pt regarding this. Parke Poisson CNA/MA  September 06, 2010 2:53 PM   1st available VQ Scan is tomorrow morning-Wed. 09/07/10 at 8:00 am at [facility]. Pt will have a CXR first then VQ.  Spoke with Dr. Chase Caller and he stated to have pt come over to office today for a stat D Dimmer. If this is high, then he will contact the on call radiologist this evening.  Call report on the D Dimmer to pager number [phone].  Called and spoke with pt and she will come over today (now) and have lab work drawn and will come up here to the floor once lab work is completed to get her instructions and appt information about the VQ Scan tomorrow. Rhonda Cobb  September 06, 2010 3:15 PM Pt came in for D-Dimmer and gave appt card and information about VQ scan for tomorrow. Pt stated that she wanted me to let Dr. Chase Caller know that she had increased her daily prednisone dose from 10mg  to 20mg  today and her chest pain is better. Also she wanted him to know that she had a blister like rash on her right side under her breast.  Dr. Chase Caller notified and pt was seen today by Dr. Chase Caller. Brands Grout  September 06, 2010 4:54 PM   Additional Follow-up for Phone Call Additional follow up Details #3:: Details for Additional Follow-up Action Taken: saw her today. cancel vq please. she has shingles VQ Scan cancelled by Dr. Golden Pop nurse Anderson Malta. She called and spoke with [facility] and cancelled VQ Scan. Satre Grout  September 07, 2010 9:28 AM  Additional Follow-up by: Brand Males MD,  September 06, 2010 5:27 PM  New Problems: CHEST PAIN, PLEURITIC [phone])

## 2010-11-22 NOTE — Assessment & Plan Note (Signed)
Summary: SIX MIN WALK-PULM STRESS TEST  Nurse Visit   Vital Signs:  Patient profile:   30 year old female Pulse rate:   78 / minute BP sitting:   120 / 74  Medications Prior to Update: 1)  Plaquenil 200 Mg Tabs (Hydroxychloroquine Sulfate) .... Take 1 Tablet By Mouth Twice A Day 2)  Lisinopril 20 Mg Tabs (Lisinopril) .Marland Kitchen.. 1 By Mouth Two Times A Day 3)  Cellcept 500 Mg Tabs (Mycophenolate Mofetil) .... Take 2 Tabs Once Daily 4)  Coreg 25 Mg Tabs (Carvedilol) .... 2 By Mouth Bid 5)  Furosemide 20 Mg Tabs (Furosemide) .... Take 1 Tablet By Mouth Once A Day 6)  Amlodipine Besylate 5 Mg  Tabs (Amlodipine Besylate) .Marland Kitchen.. 1 By Mouth Every Day 7)  Spironolactone 25 Mg Tabs (Spironolactone) .... Take 1 Tablet By Mouth Once A Day 8)  Prednisone (Pak) 10 Mg Tabs (Prednisone) .... Take As Directed Taper Dose Pak  Allergies: No Known Drug Allergies  Orders Added: 1)  Pulmonary Stress (6 min walk) [94620]   Six Minute Walk Test Medications taken before test(dose and time): pt did not take any meds before her test today.  Supplemental oxygen during the test: No  Lap counter(place a tick mark inside a square for each lap completed) lap 1 complete  lap 2 complete   lap 3 complete   lap 4 complete  lap 5 complete  lap 6 complete  lap 7 complete   lap 8 complete   lap 9 complete  lap 10 complete   Baseline  BP sitting: 120/ 74 Heart rate: 78 Dyspnea ( Borg scale) 0 Fatigue (Borg scale) 0.5 SPO2 100  End Of Test  BP sitting: 124/ 76 Heart rate: 116 Dyspnea ( Borg scale) 3 Fatigue (Borg scale) 3 SPO2 96  2 Minutes post  BP sitting: 122/ 70 Heart rate: 90 SPO2 100  Stopped or paused before six minutes? No  Interpretation: Number of laps  10 X 48 meters =   480 meters+ final partial lap: 43 meters =    523 meters   Total distance walked in six minutes: 523 meters  Tech ID: Cooper Render, CNA (June 14, 2010 3:19 PM) Tech Comments Pt completed test w/ 0 rest breaks and  1 complaint- pressure on chest which resolved at 2 mins post.

## 2010-11-22 NOTE — Letter (Signed)
Summary: [place]   Imported By: Bubba Hales 06/10/2010 10:44:06  _____________________________________________________________________  External Attachment:    Type:   Image     Comment:   External Document

## 2010-11-22 NOTE — Progress Notes (Signed)
Summary: status of form  Phone Note Call from Patient Call back at Home Phone [phone]   Caller: Patient Call For: Tobby Fawcett Summary of Call: Wants to know the status of form for her letairis. Initial call taken by: Netta Neat,  August 15, 2010 10:21 AM  Follow-up for Phone Call        Form was in MR look-at. I will speak to him this PM when he comes in for office. Pt aware. Pierpoint Bing CMA  August 15, 2010 12:01 PM

## 2010-11-22 NOTE — Progress Notes (Signed)
Summary: ? increase lasix  Phone Note Outgoing Call   Summary of Call: Hi Tom, Cath showed mikdly high PCWP. Also BNP is slightly more than 100.  I am starting letairis fro PAH but wondered if there is a role to get the wedge down from 18 via incresing lasix. Pls let me know. Currently on lasix 20mg  per day. IF ok to increase lasix, pls give new dose and also if KCL needed. I can communicate with patient Initial call taken by: Brand Males MD,  August 01, 2010 7:51 PM     Appended Document: ? increase lasix increase lasix to 40mg . BMET in 2 weeks.  Appended Document: ? increase lasix left message to call back.  Appended Document: ? increase lasix Ms. Stapf will increase furosemide to 40mg  daily.  She has an appt. with Dr. Verl Blalock on 10/17 Horton Chin RN   Clinical Lists Changes  Medications: Rx of FUROSEMIDE 40 MG TABS (FUROSEMIDE) 1 tab once daily;  #30 x 6;  Signed;  Entered by: Joelyn Oms RN;  Authorized by: Renella Cunas, MD, [facility];  Method used: Electronically to [facility] [address] # [phone]*, [address] [place], [place]  [address], Ph: [phone], Fax: [phone]    Prescriptions: FUROSEMIDE 40 MG TABS (FUROSEMIDE) 1 tab once daily  #30 x 6   Entered by:   Joelyn Oms RN   Authorized by:   Renella Cunas, MD, [facility]   Signed by:   Joelyn Oms RN on 08/05/2010   Method used:   Electronically to        [facility] Dr[address] # [phone]* (retail)       [address]       [place], [place]  [address]       Ph: [phone]       Fax: [phone]   RxID:   [id number]

## 2010-11-23 ENCOUNTER — Ambulatory Visit: Admit: 2010-11-23 | Payer: Self-pay | Admitting: Internal Medicine

## 2010-11-23 ENCOUNTER — Ambulatory Visit: Payer: Self-pay

## 2010-11-24 ENCOUNTER — Encounter: Payer: Self-pay | Admitting: Internal Medicine

## 2010-11-24 ENCOUNTER — Encounter: Payer: Self-pay | Admitting: Cardiology

## 2010-11-24 NOTE — Progress Notes (Signed)
Summary: needs right heart cath  Phone Note Outgoing Call   Summary of Call: Message to Dr. Mar Daring: TOm, This lady needs a right heart cath. I am concerned she might have pulm htn due to lupus. Do you do it or is it [facility] ? Thanks, Murali Initial call taken by: Brand Males MD,  July 08, 2010 11:29 AM  Follow-up for Phone Call        We will arrange through [facility]. Thxs Follow-up by: Renella Cunas, MD, [facility],  July 08, 2010 1:36 PM     Appended Document: needs right heart cath Talked with patient scheduled cath for Friday 07/15/10 with Dr. Haroldine Laws.  Pt will come to office on 9/20 for lab work.  She will receive written instructions at that time.  I will also forward this to triage. Horton Chin RN  Appended Document: needs right heart cath Called and spoke with Blardin at [place] and pre cert is not required for any outpt proc including right heart cath.

## 2010-11-24 NOTE — Letter (Signed)
Summary: [place] Office Note   [place] Office Note   Imported By: Sallee Provencal 11/18/2010 16:28:55  _____________________________________________________________________  External Attachment:    Type:   Image     Comment:   External Document

## 2010-11-24 NOTE — Letter (Signed)
Summary: [place]   Imported By: Laural Benes 11/07/2010 14:58:45  _____________________________________________________________________  External Attachment:    Type:   Image     Comment:   External Document

## 2010-11-24 NOTE — Assessment & Plan Note (Signed)
Summary: ear infection/dlo   Vital Signs:  Patient profile:   30 year old female Weight:      236 pounds Temp:     99.3 degrees F oral BP sitting:   120 / 74  (left arm)  Vitals Entered By: Malachi Bonds CMA (October 08, 2010 12:18 PM) CC: R ear pain x1 week    History of Present Illness: Patient seen Sat work in clinic with R ear pain.  History of lupus, pulmonary hypertension, and  cardiomyopathy. Went to urgent care center Monday diagnosed with otitis externa. Given antibiotic drops with no real improvement. Questionable low-grade fever. History of middle ear infection left ear several months ago with rupture. No headaches.  No signif drainage from R ear.  No hearing loss.  Current Medications (verified): 1)  Plaquenil 200 Mg Tabs (Hydroxychloroquine Sulfate) .... Take 1 Tablet By Mouth Twice A Day 2)  Lisinopril 20 Mg Tabs (Lisinopril) .Marland Kitchen.. 1 By Mouth Two Times A Day 3)  Cellcept 500 Mg Tabs (Mycophenolate Mofetil) .... Take 2 Tabs Two Times A Day 4)  Coreg 25 Mg Tabs (Carvedilol) .... 2 By Mouth Bid 5)  Furosemide 40 Mg Tabs (Furosemide) .Marland Kitchen.. 1 Tab Once Daily 6)  Amlodipine Besylate 5 Mg  Tabs (Amlodipine Besylate) .Marland Kitchen.. 1 By Mouth Every Day 7)  Spironolactone 25 Mg Tabs (Spironolactone) .... Take 1 Tablet By Mouth Once A Day 8)  Prednisone 10 Mg  Tabs (Prednisone) .... Once Daily 9)  Vicodin 5-500 Mg Tabs (Hydrocodone-Acetaminophen) .Marland Kitchen.. 1-2 Tabs By Mouth Q6h As Needed For Pain 10)  Letairis 10 Mg Tabs (Ambrisentan) .... Take 1 Tablet Daily  Allergies (verified): No Known Drug Allergies  Review of Systems      See HPI  Physical Exam  General:  Well-developed,well-nourished,in no acute distress; alert,appropriate and cooperative throughout examination Ears:  chronic perforation left eardrum with no acute signs of inflammation or drainage. Right canal reveals edema with minimal drainage. Eardrum only partially visible and no significant erythema but some yellowish  discoloration of TM evident. Mouth:  Oral mucosa and oropharynx without lesions or exudates.  Teeth in good repair. Lungs:  Normal respiratory effort, chest expands symmetrically. Lungs are clear to auscultation, no crackles or wheezes. Heart:  normal rate and regular rhythm.     Impression & Recommendations:  Problem # 1:  OTITIS MEDIA (ICD-382.9) She had evidence of otitis externa and probable otitis media as well. Start Augmentin and keep ear dry. Her updated medication list for this problem includes:    Amoxicillin-pot Clavulanate 875-125 Mg Tabs (Amoxicillin-pot clavulanate) ..... One by mouth two times a day for 10 days  Complete Medication List: 1)  Plaquenil 200 Mg Tabs (Hydroxychloroquine sulfate) .... Take 1 tablet by mouth twice a day 2)  Lisinopril 20 Mg Tabs (Lisinopril) .Marland Kitchen.. 1 by mouth two times a day 3)  Cellcept 500 Mg Tabs (Mycophenolate mofetil) .... Take 2 tabs two times a day 4)  Coreg 25 Mg Tabs (Carvedilol) .... 2 by mouth bid 5)  Furosemide 40 Mg Tabs (Furosemide) .Marland Kitchen.. 1 tab once daily 6)  Amlodipine Besylate 5 Mg Tabs (Amlodipine besylate) .Marland Kitchen.. 1 by mouth every day 7)  Spironolactone 25 Mg Tabs (Spironolactone) .... Take 1 tablet by mouth once a day 8)  Prednisone 10 Mg Tabs (Prednisone) .... Once daily 9)  Vicodin 5-500 Mg Tabs (Hydrocodone-acetaminophen) .Marland Kitchen.. 1-2 tabs by mouth q6h as needed for pain 10)  Letairis 10 Mg Tabs (Ambrisentan) .... Take 1 tablet daily 11)  Amoxicillin-pot  Clavulanate 875-125 Mg Tabs (Amoxicillin-pot clavulanate) .... One by mouth two times a day for 10 days  Patient Instructions: 1)  Keep ears dry as possible.  Complete course of antibiotic. Touch base with primary physician in one week if no better Prescriptions: AMOXICILLIN-POT CLAVULANATE 875-125 MG TABS (AMOXICILLIN-POT CLAVULANATE) one by mouth two times a day for 10 days  #20 x 0   Entered and Authorized by:   Carolann Littler MD   Signed by:   Carolann Littler MD on  10/08/2010   Method used:   Electronically to        [facility] [address] # [phone]* (retail)       [address]       [place], [place]  [address]       Ph: [phone]       Fax: [phone]   RxID:   [id number]    Orders Added: 1)  Est. Patient Level III [id number]

## 2010-12-20 NOTE — Letter (Signed)
Summary: Geoffry Paradise MD/[facility]  Geoffry Paradise MD/[facility]   Imported By: Bubba Hales 12/15/2010 08:22:56  _____________________________________________________________________  External Attachment:    Type:   Image     Comment:   External Document

## 2010-12-21 ENCOUNTER — Telehealth (INDEPENDENT_AMBULATORY_CARE_PROVIDER_SITE_OTHER): Payer: Self-pay | Admitting: *Deleted

## 2010-12-23 ENCOUNTER — Other Ambulatory Visit: Payer: BC Managed Care – PPO

## 2010-12-23 ENCOUNTER — Other Ambulatory Visit: Payer: Self-pay | Admitting: Internal Medicine

## 2010-12-23 ENCOUNTER — Encounter (INDEPENDENT_AMBULATORY_CARE_PROVIDER_SITE_OTHER): Payer: Self-pay | Admitting: *Deleted

## 2010-12-23 DIAGNOSIS — I2789 Other specified pulmonary heart diseases: Secondary | ICD-10-CM

## 2010-12-23 LAB — PREGNANCY SERUM, QUANT: hCG, Beta Chain, Quant, S: 0.6 m[IU]/mL

## 2010-12-23 LAB — HEPATIC FUNCTION PANEL
ALT: 20 U/L (ref 0–35)
Bilirubin, Direct: 0.1 mg/dL (ref 0.0–0.3)
Total Bilirubin: 0.4 mg/dL (ref 0.3–1.2)

## 2010-12-29 NOTE — Progress Notes (Signed)
Summary: verification of dosage on med-LMTCB x 1  Phone Note Call from Patient Call back at Home Phone [phone]   Caller: Patient Call For: ramaswamy Reason for Call: Refill Medication Summary of Call: Need verification of dosage on h er letairis. Initial call taken by: Netta Neat,  December 21, 2010 1:02 PM  Follow-up for Phone Call        [facility] Tilden Dome  December 21, 2010 2:18 PM  Spoke with pt.  She states that she needs rx for letairis 10 mg- this was increased from 5 mg at her last visit in Dec.  She states that her pharmacy told her that they faxed [place] refill request and we approved it, but it was still for the 5 mg. I will call them ([facility] [phone] to change rx.  Called and spoke with pharmacist at [facility] and advised pt needs letairis 10 mg daily # 30 with 5 RF.  Pt aware this was done.  Follow-up by: Tilden Dome,  December 21, 2010 2:51 PM    New/Updated Medications: LETAIRIS 10 MG TABS (AMBRISENTAN) 1 by mouth once daily Prescriptions: LETAIRIS 10 MG TABS (AMBRISENTAN) 1 by mouth once daily  #30 x 5   Entered by:   Tilden Dome   Authorized by:   Brand Males MD   Signed by:   Tilden Dome on 12/21/2010   Method used:   Telephoned to ...       [facility]* (retail)       [address] [place]  [address]       Ph: [phone]       Fax: [phone]   RxID:   [id number]

## 2011-01-03 NOTE — Letter (Signed)
Summary: [facility] Patient Note   [facility] Patient Note   Imported By: Sallee Provencal 12/28/2010 10:42:15  _____________________________________________________________________  External Attachment:    Type:   Image     Comment:   External Document

## 2011-01-05 LAB — POCT I-STAT 3, VENOUS BLOOD GAS (G3P V)
Bicarbonate: 22.1 mEq/L (ref 20.0–24.0)
Bicarbonate: 23.3 mEq/L (ref 20.0–24.0)
O2 Saturation: 63 %
TCO2: 25 mmol/L (ref 0–100)
pCO2, Ven: 42.6 mmHg — ABNORMAL LOW (ref 45.0–50.0)
pH, Ven: 7.332 — ABNORMAL HIGH (ref 7.250–7.300)
pH, Ven: 7.333 — ABNORMAL HIGH (ref 7.250–7.300)
pH, Ven: 7.337 — ABNORMAL HIGH (ref 7.250–7.300)
pO2, Ven: 35 mmHg (ref 30.0–45.0)
pO2, Ven: 35 mmHg (ref 30.0–45.0)
pO2, Ven: 35 mmHg (ref 30.0–45.0)

## 2011-01-05 LAB — POCT I-STAT 3, ART BLOOD GAS (G3+)
Bicarbonate: 20.5 mEq/L (ref 20.0–24.0)
O2 Saturation: 96 %
TCO2: 22 mmol/L (ref 0–100)
pCO2 arterial: 41.3 mmHg (ref 35.0–45.0)

## 2011-01-15 ENCOUNTER — Other Ambulatory Visit: Payer: Self-pay | Admitting: Cardiology

## 2011-01-19 NOTE — Telephone Encounter (Signed)
[address] °

## 2011-01-20 ENCOUNTER — Other Ambulatory Visit: Payer: Self-pay | Admitting: *Deleted

## 2011-01-20 MED ORDER — CARVEDILOL 25 MG PO TABS: 50.0000 mg | ORAL_TABLET | Freq: Two times a day (BID) | ORAL | Status: DC

## 2011-01-31 ENCOUNTER — Other Ambulatory Visit (INDEPENDENT_AMBULATORY_CARE_PROVIDER_SITE_OTHER): Payer: BC Managed Care – PPO

## 2011-01-31 ENCOUNTER — Other Ambulatory Visit: Payer: Self-pay | Admitting: Internal Medicine

## 2011-01-31 DIAGNOSIS — I2789 Other specified pulmonary heart diseases: Secondary | ICD-10-CM

## 2011-01-31 DIAGNOSIS — I272 Pulmonary hypertension, unspecified: Secondary | ICD-10-CM

## 2011-01-31 LAB — HEPATIC FUNCTION PANEL
ALT: 20 U/L (ref 0–35)
AST: 21 U/L (ref 0–37)
Albumin: 3.4 g/dL — ABNORMAL LOW (ref 3.5–5.2)
Alkaline Phosphatase: 61 U/L (ref 39–117)
Bilirubin, Direct: 0.1 mg/dL (ref 0.0–0.3)
Total Protein: 6.8 g/dL (ref 6.0–8.3)

## 2011-02-01 LAB — HCG, QUANTITATIVE, PREGNANCY: hCG, Beta Chain, Quant, S: <2 m[IU]/mL

## 2011-03-02 ENCOUNTER — Encounter: Payer: Self-pay | Admitting: Internal Medicine

## 2011-03-07 NOTE — Assessment & Plan Note (Signed)
[facility] OFFICE NOTE   NAME:Morren, JONATHA HABICHT                  MRN:          [id number]  DATE:11/09/2008                            DOB:          [birth year]    Ms. Crocitto comes in today for followup of her nonischemic  cardiomyopathy and uncontrolled hypertension.   She saw Dr. Leanne Chang recently who added amlodipine to her regimen.   She has had to be on prednisone tapers because of aches and pains.  She  is still on Plaquenil as well.  She is on a prednisone taper as we  speak.  She denies any orthopnea, PND, or peripheral edema.   CURRENT MEDICATIONS:  1. Lasix 20 mg a day.  2. Coreg 50 mg p.o. b.i.d.  3. CellCept 1000 mg a day.  4. Prednisone 5 mg a day.  5. Plaquenil 200 mg a day.  6. Amlodipine 5 mg a day.  7. Spironolactone 25 mg a day.  8. Lisinopril 20 mg p.o. b.i.d.   Her last echocardiogram was in October 2008.  Her EF was normal at that  time.   PHYSICAL EXAMINATION:  VITAL SIGNS:  Her blood pressure 110/76, her  pulse is 85 and regular, weight is 231, was increased from 216.  HEENT:  Normal.  NECK:  Carotid upstrokes were equal bilaterally without bruits.  Thyroid  is not palpable or tender.  LUNGS:  Clear to auscultation and percussion.  HEART:  Nondisplaced PMI.  Systolic murmur along the left sternal  border.  No gallop.  ABDOMEN:  Soft, good bowel sounds.  Organomegaly was difficult to  appreciate.  EXTREMITIES:  Only trace edema.  Pulses are intact.  NEURO:  Intact.  SKIN:  Unremarkable.   Her EKG shows sinus rhythm with no changes.   ASSESSMENT AND PLAN:  Ms. Wilt is stable from our standpoint.  Her  blood pressure seems to respond well to amlodipine.  I suspect that it  will be difficult to control when she is on prednisone as I explained  her today.   Her weight is becoming a major problem as well.  She has been encouraged  to drop her weight.   PLAN:  1. A 2-D  echocardiogram to reassess LV function, any LV dilatation,      any right-sided pressures, and any question of LVH.  2. Continue current medication.  3. See [place] back again in 6 months.     Thomas C. Verl Blalock, MD, [facility]  Electronically Signed    TCW/MedQ  DD: 11/09/2008  DT: 11/10/2008  Job #: [id number]

## 2011-03-07 NOTE — H&P (Signed)
NAMEJOSHUA, AMBRIZ NO.:  [id number]   MEDICAL RECORD NO.:  [id number]          PATIENT TYPE:  EMS   LOCATION:  MAJO                         FACILITY:  [place] [facility]   PHYSICIAN:  Denice Bors. Stanford Breed, MD, FACCDATE OF BIRTH:  [birth year]   DATE OF ADMISSION:  08/05/2007  DATE OF DISCHARGE:                              HISTORY & PHYSICAL   Mrs. Barczak is a 30 year old female with past medical history of  hypertension, systemic lupus erythematosus, lupus nephritis, nonischemic  cardiomyopathy who we are asked to evaluate for chest pain and shortness  of breath.  The patient does have a history of nonischemic  cardiomyopathy.  Previous echocardiogram in March 2007 revealed an  ejection fraction of 20-30%.  She subsequently underwent cardiac  catheterization in March 2007.  Her coronary arteries were normal.  Her  ejection fraction was 25%.  Pulmonary artery pressure was 60/34 and a  pulmonary capillary wedge pressure was 32.  She was treated medically.  Note, her most recent echocardiogram was performed on September 25, 2006,  and her ejection fraction was 60%.  The patient states that for the past  two to three months, she has had intermittent chest pain.  The pain is  in the upper chest area.  It does not radiate.  It is not pleuritic but  it does increase with lying flat and also with exertion.  The pain is  described as a throb or a tightness.  There is no associated nausea,  vomiting or diaphoresis but there is shortness of breath.  Also of note,  she has complained of increasing dyspnea for the past two to three  months.  It worsens when she lies flat and also with activities.  She  developed chest pain last night at approximately 8:00 p.m. and it  persisted throughout the night.  She presented to the emergency room  today and we were asked to further evaluate.  At present she is pain-  free.   PAST MEDICAL HISTORY:  Significant for  1. Hypertension.  2. There is no  diabetes mellitus or hyperlipidemia.  3. She has a history of systemic lupus erythematosus as well as lupus      nephritis.  4. She has a history of nonischemic cardiomyopathy as described in the      HPI.  5. There is a question of history of TIAs in the past.  6. She has had previous kidney biopsy.   FAMILY HISTORY:  Negative for coronary artery disease.   SOCIAL HISTORY:  She denies any tobacco, alcohol or drug use.   ALLERGIES:  NO KNOWN DRUG ALLERGIES.   MEDICATIONS:  1. Plaquenil 200 mg p.o. b.i.d.  2. Prednisone 5 mg p.o. daily.  3. CellCept 1000 mg p.o. b.i.d.  4. Lisinopril 20 mg daily.  5. Coreg 50 mg p.o. b.i.d.  6. Digoxin 0.5 mg daily.  7. Lasix 20 mg p.o. daily.  8. Spironolactone 25 mg p.o. daily.  9. She was on Coumadin but this has been discontinued by her report.   REVIEW OF SYSTEMS:  She denies any headaches, fevers or chills.  There  is no productive cough or hemoptysis.  There is no dysphagia,  odynophagia, melena or hematochezia.  There is no dysuria or hematuria.  There is no rash or seizure activity.  There is orthopnea but there is  no PND.  She denies any pedal edema.  Remaining systems are negative.   PHYSICAL EXAMINATION:  GENERAL APPEARANCE:  She is well-developed and  mildly obese.  She does not appear to be in acute distress.  She does  not appear to be depressed.  SKIN:  Warm and dry.  VITAL SIGNS:  Her pulse is 95.  Her blood pressure is 126/86 and she has  a temperature of 98.8.  She has a respiratory rate of 20.   BACK:  Normal.  HEENT:  Normal with normal eyelids.  NECK:  Supple with normal upstroke bilaterally.  No bruits noted.  There  is no jugular distention.  I cannot appreciate thyromegaly.  CHEST:  Clear to auscultation with normal expansion.  CARDIOVASCULAR:  Regular rate and rhythm.  Normal S1 and S2.  There is a  2/6 systolic ejection murmur at the left sternal border.  There is a  gallop noted.  There is no rub noted.  She is  not tender in the chest  area.  ABDOMEN:  Nontender, nondistended with positive bowel sounds.  No  hepatosplenomegaly.  No mass appreciated.  There is no abdominal bruit.  She is 2+ femoral pulses bilaterally.  No bruits.  EXTREMITIES:  There is no peripheral clubbing.  No edema and I could  palpate no cords.  She has 2+ dorsalis pedis pulses bilaterally.  NEUROLOGIC:  Grossly intact.   Electrocardiogram today shows a normal sinus rhythm with nonspecific ST  changes and prolonged QT.   Her BNP is 37.  Her CK-MB via point of care markers was 7.7 and troponin-  I was less than 0.05.  Creatinine is 1.4.   Chest x-ray shows bibasilar scar or atelectasis.   DIAGNOSES:  1. Atypical chest pain - her symptoms are very atypical.  I note that      she did have a cardiac catheterization in March 2007 that showed      normal coronary arteries.  Her CK-MB is elevated but I do not have      a total CK as of yet.  I also note her troponin I is normal.  We      will admit and cycle enzymes.  If they are negative, then we will      not pursue further ischemia evaluation.  We will check an      echocardiogram to reassess her LV function given her history of      nonischemic cardiomyopathy and also to exclude pericardial effusion      as her chest pain does not worsen with lying flat.  2. History of nonischemic cardiomyopathy - she will continue on her      previous medications including her Coreg, lisinopril, digoxin and      diuretic.  The echocardiogram will be performed to reassess her      left ventricular function.  3. Dyspnea - we will check the echocardiogram as described above and I      will also check a D-dimer.  Note her B natriuretic peptide is      normal.  4. Hypertension - we will continue with her present blood pressure      medications.  5. Systemic lupus erythematosus - we will continue with her present  medications.   We will most likely discharge the patient tomorrow  morning if her  symptoms are stable.  We will add aspirin.      Denice Bors Stanford Breed, MD, [facility]  Electronically Signed     BSC/MEDQ  D:  08/05/2007  T:  08/06/2007  Job:  [id number]

## 2011-03-07 NOTE — Discharge Summary (Signed)
[name] [name], [name] [name] NO.:  [id number]   MEDICAL RECORD NO.:  [id number]          PATIENT TYPE:  INP   LOCATION:  2031                         FACILITY:  [place]   PHYSICIAN:  Marijo Conception. Wall, MD, FACCDATE OF BIRTH:  07-Oct-1981   DATE OF ADMISSION:  08/05/2007  DATE OF DISCHARGE:  08/07/2007                               DISCHARGE SUMMARY   PRIMARY FINAL DISCHARGE DIAGNOSIS:  Chest pain, cardiac enzymes negative  for myocardial infarction and ejection fraction of 55% at  echocardiogram.   SECONDARY DIAGNOSES:  1. Systemic lupus erythematosus.  2. Lupus nephritis.  3. Hypertension.  4. History of nonischemic cardiomyopathy with an ejection fraction      previously known to be 25% and normal coronary arteries by      catheterization in March 2007.  5. Possible upper respiratory illness, complete 7 days of antibiotics.   TIME OF DISCHARGE:  34 minutes.   HOSPITAL COURSE:  [name] [name] is a 30 year old female with a history of  nonischemic cardiomyopathy and lupus nephritis.  She came to the  hospital on August 05, 2007, for chest pain.  She was admitted for  further evaluation and treatment.   She also complained of dyspnea and a 2-D echocardiogram was performed  that showed an EF of 55% with mildly increased ventricular wall  thickness.  There was no evidence of elevated right-sided pressures and  there was a trivial pericardial effusion.  She had a D-dimer that was  elevated at 1.69, but a VQ scan showed a matched defect and was low  probability for PE.  She was anemic with a hemoglobin of 9.1 and  hematocrit of 27.6.  Her initial creatinine was slightly elevated at 1.4  but a recheck showed a BUN of 15 and 1.1.  Albumin was low at 2.1,  calcium slightly low at 7.9.  Her troponins were less than 0.05.  Her  CKs and MBs were slightly up but the index was all less than 1.  Because  her history of lupus, a rheumatology consult was called.   [name] [name] was  seen by Dr. Charlestine Night.  A C3 complement was low at 61 and  a C4 complement was low at 12.  Urinalysis showed moderate blood,  greater than 300 protein, few bacteria and few squamous epithelial  cells.  Dr. Charlestine Night felt that she needed further evaluation and pending  lab tests include a sed rate, DNAase B antibody and ANA.  He will follow  her in the office.  He recommended a 7-day course of antibiotics and the  addition of prednisone to her medication regimen.   By August 07, 2007, [name] [name] was no longer febrile.  She was  possibly experiencing reflux symptoms and was started on Prilosec with  some improvement.  She was evaluated by Dr. Charlestine Night and Dr. Verl Blalock and  considered stable for discharge with outpatient follow-up arranged.   DISCHARGE INSTRUCTIONS:  1. Her activity level is to be increased gradually.  2. She is to follow up with Dr. Verl Blalock on August 22, 2007, at 10 a.m.  3. Follow up with Dr. Charlestine Night  and call for appointment.  4. She is to follow up Dr. Leanne Chang as needed.   DISCHARGE MEDICATIONS:  1. Prilosec 20 mg daily.  2. Plaquenil 200 mg b.i.d.  3. CellCept 1000 mg b.i.d.  4. Lisinopril 20 mg daily.  5. Coreg 50 mg b.i.d.  6. Aldactone 25 mg daily.  7. Digoxin is on hold for now.  8. Amoxicillin 500 mg t.i.d. for 7 days.  9. Prednisone 5 mg daily.      Rosaria Ferries, PA-C      Marijo Conception. Verl Blalock, MD, [facility]  Electronically Signed    RB/MEDQ  D:  08/07/2007  T:  08/08/2007  Job:  [id number]   cc:   Darrick Penna. Swords, MD  Lindaann Slough, M.D.

## 2011-03-07 NOTE — Assessment & Plan Note (Signed)
[facility] OFFICE NOTE   NAME:[name] [name], [name] [name]                  MRN:          [id number]  DATE:08/26/2007                            DOB:          05/12/1981    [name] [name] returns today post discharge from [facility].  She  was admitted August 05, 2007 with chest pain.   Her cardiac enzymes were negative, repeat echo showed an EF of 55% with  no significant effusion.   She spiked a temperature in the hospital, we were thinking she may have  had some sort of viral URI.  She was placed on antibiotics.  Her D-dimer  was slightly up, VQ showed low probability for PE, she was anemic with a  hemoglobin of 9.1.  I had Dr. Charlestine Night see her because she wants to  establish with someone in [place] as opposed to [facility], I was  also concerned about her anemia and the status of her lupus.  Her sed  rate was high at 102, her complements were down, ANA was positive, anti-  DNase apparently was not elevated.   Dr. Charlestine Night has yet to see her in the office because [name] [name] has not  followed up.  I have encouraged her to do so.  Dr. Charlestine Night at the time  was not too concerned that her lupus was in a major flare.   MEDICATIONS:  1. Plaquenil 200 mg p.o. b.i.d.  2. Prednisone 10 mg a day.  3. Lasix 20 mg a day.  4. Coreg 50 mg p.o. b.i.d.  5. Lisinopril 20 mg b.i.d.  6. Cellcept 1000 mg b.i.d.  7. Spironolactone 25 mg a day.   EXAMINATION:  Blood pressure is 114/64, pulse 87 and regular, weight is  216 down 14 pounds.  She looks in no acute distress.  Skin is warm and  dry, respiratory rate is 18, no obvious rashes.  HEENT:  Otherwise normal.  Carotid upstrokes were equal bilaterally  without bruits, no JVD, thyroid is not enlarged, there is no  lymphadenopathy.  LUNGS:  Clear to auscultation.  HEART:  Reveals a nondisplaced PMI, there is no rub, normal S1-S2.  ABDOMINAL:  Soft.  EXTREMITIES:   Reveal no edema, pulses are intact.  NEURO:  Intact.   EKG is normal.   ASSESSMENT/PLAN:  [name] [name] is doing well from our standpoint.  Her  nonischemic cardiomyopathy is with significantly reduced systolic  dysfunction, has recovered and remained stable.  She is on a good  medical program.  I am concerned about the status of her lupus and  ongoing treatment of this.   I have advised her to see Dr. Ree Edman as soon as she can get an  appointment.  I will see her back in 6 months.     Thomas C. Verl Blalock, MD, [facility]  Electronically Signed    TCW/MedQ  DD: 08/26/2007  DT: 08/27/2007  Job #: [id number]   cc:   [name] [name], M.D.

## 2011-03-10 NOTE — H&P (Signed)
[name] [name], PRUSHA NO.:  [id number]   MEDICAL RECORD NO.:  [id number]          PATIENT TYPE:  INP   LOCATION:  4709                         FACILITY:  [place]   PHYSICIAN:  [name] Conception. Wall, M.D.   DATE OF BIRTH:  1980/12/25   DATE OF ADMISSION:  01/15/2006  DATE OF DISCHARGE:                                HISTORY & PHYSICAL   PRIMARY CARDIOLOGIST:  New and will be [name] [name].   PRIMARY MEDICAL DOCTOR:  [facility] (now with insurance and unable to  see them anymore).   RHEUMATOLOGIST:  [name] Holler. Elwyn Reach, M.D., at [facility]:  Chest pain.   HISTORY OF PRESENT ILLNESS:  [name] [name] is a 30 year old female with no  known history of coronary artery disease.  She reports a one week history of  exertional substernal chest pain described as a tightness.  It reached a  6/10.  She also has had dyspnea on exertion.  She has noticed some increased  lower extremity edema as well as orthopnea and PND.  She has had tachy  palpitations as well.  Today, she said her symptoms were not any worse but  said she is tired of dealing with it and came to the emergency room.  She  came to the emergency room within the last year for similar symptoms but  they were not this bad.  She was seen and sent home and has not had any  other significant problems until last week.  She says this time her symptoms  are both worse in intensity and have been going on longer.  She is compliant  with her medications but her diet is pretty poor.  She has not had any  resting symptoms.   PAST MEDICAL HISTORY:  1.  Left ventricular dysfunction.  EF 55-65% by echocardiogram in September.      EF 40-50% by echocardiogram in October 2006.  EF 20/30% by      echocardiogram today.  2.  Hypertension.  3.  Obesity.  4.  Lupus.  5.  History of lupus nephritis/renal insufficiency.   SURGICAL HISTORY:  Kidney biopsy.   ALLERGIES:  No known drug allergies.   CURRENT  MEDICATIONS:  1.  Aspirin 81 mg a day.  2.  Calcium 600 mg a day.  3.  Cartia XT 240 mg a day.  4.  Lasix 20 mg a day.  5.  Lisinopril 10 mg b.i.d.  6.  Prednisone 10 mg a day.  7.  Plaquenil 200 mg b.i.d.   SOCIAL HISTORY:  She lives in [place] with her sister and works in [employer].  She has no history of alcohol, tobacco, or drug abuse.   FAMILY HISTORY:  Her parents are both alive.  Her mother is 81 and father is  29.  Neither one has heart disease.  She has no siblings with heart disease.  She has no other family members with a history of coronary artery disease at  a young age.   REVIEW OF SYSTEMS:  Significant for lower extremity edema.  She does not  known if she has  gained or lost any weight.  She has pain and shortness of  breath as described above.  She is also having tachy palpitations.  She  denies any hematemesis, hemoptysis, or melena.  She has chronic joint pains.  She has both heat and cold intolerance.  Review of systems is otherwise  negative.   PHYSICAL EXAMINATION:  VITAL SIGNS:  Her temperature is 98.8 (originally  100.8), blood pressure 156/100, heart rate 125, respiratory rate 30, O2  saturation 100% on 2 liters.  GENERAL:  She is a well developed, obese, African American female in obvious  respiratory distress when she moves around.  HEENT:  Head is normocephalic and atraumatic with pupils are equal, round,  and reactive to light and accommodation.  Extraocular movements intact.  Sclerae are clear.  Nares without discharge.  NECK:  No lymphadenopathy.  No thyromegaly.  No carotid bruits are  appreciated.  JVD is difficult to assess because of body habitus.  CHEST:  She has decreased breath sounds in the bases with some crackles.  CV:  Her heart is rapid in rate and rhythm and regular with a clear S3  noted.  SKIN:  No rashes or lesions are noted.  ABDOMEN:  Soft and nontender with active bowel sounds.  EXTREMITIES:  She has 1+ lower extremity edema  bilaterally.  Distal pulses  are 2+ in all four extremities and no femoral bruits are appreciated.  MUSCULOSKELETAL:  She has got no joint deformity or effusions.  No spine or  CVA tenderness.  NEUROLOGIC:  She is alert and oriented.  Cranial nerves II-XII are grossly  intact.   Chest x-ray shows pulmonary edema and cardiomegaly.   EKG is sinus tachycardia with no acute ischemic changes.   LABORATORY VALUES:  Pending at the time of dictation but her BNP is 1313.   IMPRESSION:  1.  Pulmonary edema.  An echocardiogram, done today by the emergency room,      showed an ejection fraction of 25-30%.  She will be admitted to the      hospital, cardiac enzymes cycled.  She will be diuresed.  Further      evaluation and treatment will be determined by the results of the above      actions.  She may need a Myoview as an outpatient if her cardiac enzymes      are elevated, and she may need a right heart catheterization this      admission if she does not diurese easily.  2.  Hypertension.  We will add a low dose Coreg at 6.25 mg b.i.d. and      increase her Cartia XT to 300 mg a day.  We will also increase her      Lisinopril to 40 mg a day as a one time dose.  This plus the diuretics      should bring      her blood pressure under good control.  Further medication adjustments      can be made on a p.r.n. basis.  3.  Lupus.  [name] will be continued on her Plaquenil and prednisone.   Rosaria Ferries, P.A. [facility] dictating for Dr. Mar Daring who saw the patient and  determined the plan of care.      Rosaria Ferries, P.A. [place]. Wall, M.D.  Electronically Signed    RB/MEDQ  D:  01/15/2006  T:  01/16/2006  Job:  [id number]

## 2011-03-10 NOTE — Assessment & Plan Note (Signed)
[facility] OFFICE NOTE   NAME:Dorough, PHENIX MALO                  MRN:          [id number]  DATE:09/11/2006                            DOB:          Nov 12, 1980    Ms. Wohlwend comes back today for further management of the following  issues.  1. Nonischemic cardiomyopathy.  2. Uncontrolled hypertension.  3. History of chest pain and palpitations.  4. A history of mild renal insufficiency.  5. Anticoagulation.  6. History of transient ischemic attacks.  INR running around 3.0 now.   She has seen Richardson Dopp, PA-C on several occasions.   Please note, when we increased her Lasix to 20 b.i.d. in the past and she  was on spironolactone, her creatinine increased to 2.3.   She has had some lower extremity edema.  She says Lasix 20 mg a day just  does not do a lot any more for her.   MEDICATIONS:  1. Plaquenil 200 mg p.o. b.i.d.  2. Prednisone 10 mg a day.  3. Digoxin 0.125 mg p.o. daily.  4. Coumadin as directed.  5. Lasix 20 mg a day.  6. Coreg 50 mg p.o. b.i.d.  7. Lisinopril 20 mg b.i.d.  8. Cellcept 1000 mg b.i.d.   Her blood pressure today is 144/97, her pulse is 90 and regular, her weight  is 224.  She is in no acute distress.  HEENT:  Unremarkable.  Carotid upstrokes are equal bilaterally without bruits.  No JVD.  LUNGS:  Clear to auscultation.  HEART:  Regular rate and rhythm without murmur, rub, or gallop.  ABDOMEN:  Soft with good bowel sounds.  EXTREMITIES:  Reveal 1+ pitting edema.  Pulses are intact.  NEURO:  Intact.   EKG shows sinus rhythm with first degree AV block and ST segment changes  inferolaterally.  This is unchanged.   ASSESSMENT AND PLAN:  Mrs. Amparan' blood pressure is under poor control.  Her weight has gone back up.  Lasix does not seem to have the impact.  She  has lower extremity edema.   PLAN:  1. Continue Lasix at current dose.  2. Watch sodium.  3. Add  back spironolactone 25 mg a day (note, it was 25 mg b.i.d. before).  4. Followup 2D echocardiogram to assess LV function since March 2007 and      also check a BMP that day.  Hopefully, she will have some improvement      in LV function.  5. Follow up with me again in 4 to 6 weeks.     Thomas C. Verl Blalock, MD, [facility]  Electronically Signed    TCW/MedQ  DD: 09/11/2006  DT: 09/11/2006  Job #: [id number]

## 2011-03-10 NOTE — Consult Note (Signed)
NAME:  KAMBREE, LUCKING NO.:  [id number]   MEDICAL RECORD NO.:  [id number]          PATIENT TYPE:  EMS   LOCATION:  MAJO                         FACILITY:  [facility]   PHYSICIAN:  Pramod P. Leonie Man, MD    DATE OF BIRTH:  06-Jul-1981   DATE OF CONSULTATION:  01/29/2006  DATE OF DISCHARGE:  01/29/2006                                   CONSULTATION   REFERRING PHYSICIAN:  Orlie Dakin, M.D.   REASON FOR CONSULTATION:  TIA.   HISTORY OF PRESENT ILLNESS:  Ms. Norrington is a 30 year old African American  lady who developed sudden onset of left face and arm progressive  paresthesias as well as some weakness at 4 a.m. today.  She woke up in the  morning feeling fine and she was making herself a sandwich.  She developed  progressive paresthesias, weakening initially in the left hand progressing  to involve the whole left upper extremity and face over a period of five  minutes.  Subsequently she noticed paresthesias involving her legs as well.  When she came to the emergency room, she complained of subjective weakness  on the left side but she had demonstrated improvement in her symptoms.  She  is still complaining of some left-sided weakness and paresthesias, though  the face and left arm have resolved.  She denies any headaches.  Patient has  similar episodes of transient __________ weakness.  In August of 2005, she  __________  involving either side of the body, most of the episodes have  been on the left.  She was admitted two weeks ago to [facility] with symptoms of chest pain, shortness of breath and some  progressive right body paresthesias.  At that time she was seen in  consultation by Dr. Legrand Como L. Reynolds, and MRI scan of the brain was  obtained which  did not reveal any acute or old strokes.  She was thought to  have possible migraine but given the fact that she has lupus and she has  hypocoagulability for that, she had cardiac echo that  revealed ejection  fraction which was depressed.  She was started on Coumadin.  She said she  was taking it regularly but her INR on admission today is about 7.2.  She  denies any history  related to migraine headaches, seizures or other  neurological problems.   PAST MEDICAL HISTORY:  1.  Lupus with nonischemic cardiomyopathy.  Echocardiogram  20 to 30%.  2.  Hypertension.  3.  Obesity.  4.  Lupus enteritis.   PAST SURGICAL HISTORY:  Kidney biopsy.   ALLERGIES:  NO KNOWN DRUG ALLERGIES.   HOME MEDICATIONS:  1.  Coumadin.  2.  Calcium.  3.  Cardia.  4.  Lasix.  5.  Lisinopril.  6.  Prednisone.  7.  Plaquenil.   SOCIAL HISTORY:  Patient lives in [place], [place], with her  sister.  She works in [employer].  She does not smoke, drink or abuse drugs.   Her rheumatologist is Dr. Daphine Deutscher at [facility].   Her cardiologist is Dr. Jenell Milliner.  REVIEW OF SYSTEMS:  As stated above.  There is no present chest pain,  shortness of breath, diarrhea or fever.   PHYSICAL EXAMINATION:  GENERAL APPEARANCE:  A mildly obese, African-American  ladywho is not in distress.  VITAL SIGNS:  Afebrile, pulse rate 70 per minute and regular, respiratory  rate 17 per minute.  HEENT:  Normocephalic. ENT is unremarkable.  NECK:  Supple without bruit.  LUNGS:  Clear to auscultation.  CARDIOVASCULAR:  No murmur or gallop.  NEUROLOGIC:  Patient is pleasant, awake, alert and cooperative.  There is no  aphasia, apraxia, dysarthria.  Pupils are equal, round, reactive to light  and accommodation.  Face is symmetric.  Palatal movements are normal.  Tongue is midline.  Motor system exam reveals no __________. The patient has  mild subjective weakness __________ with grade II to III strength proximally  at the hip flexors, 4/5 in both knees.  __________.  She is able to maintain  tone in the left lower extremity quite well.  I doubt her cooperation for  detailed on comprehensive testing.   She still complains of some subjective  stiffness in the left hand and left leg, though she __________.  Coordination is normal in upper extremities, impaired in the lower  extremities.  Gait was not tested.   DATA REVIEWED:  Noncontrast CAT scan of the head done today revealed no  active abnormalities and it is normal.  MRI scan of the brain revealed a  possibility for __________ but showed no acute or old strokes.  Questionable  area of hypodensitities noted __________ from previous finding.   IMPRESSION:  A 30 year old lady with symptoms of paresthesia and transient  weakness on the left body which seems to be improving. She has had multiple  such episodes in the past and has known history of lupus.  Progressive__________ nature of symptoms would argue against a true  transient ischemic attack and would favor migraine, however, she lacks  typical history to suggest the same.   PLAN:  Patient has already been started on Coumadin for presumed  cardioembolic transient ischemic attacks recently.  Her INR is suboptimal.  I would recommend bridging with Lovenox and maintaining INR of 2 to 3.  I do  not believe hospitalization or repeating MRI study is indicated as both have  been done recently in the last two weeks.  She may need to work up her  hypercoagulability, but I presume that may have been done previously by her  oncologist at [facility].  She is on Coumadin __________.  She may follow  up __________.           ______________________________  Kathie Rhodes. Leonie Man, MD     PPS/MEDQ  D:  01/29/2006  T:  01/29/2006  Job:  [id number]

## 2011-03-10 NOTE — Cardiovascular Report (Signed)
[name], [name] NO.:  [id number]   MEDICAL RECORD NO.:  [id number]          PATIENT TYPE:  INP   LOCATION:  [id number]                         FACILITY:  [facility]   PHYSICIAN:  Minus Breeding, M.D.   DATE OF BIRTH:  05/03/1981   DATE OF PROCEDURE:  01/18/2006  DATE OF DISCHARGE:                              CARDIAC CATHETERIZATION   PRIMARY CARE PHYSICIAN:  [facility].   RHEUMATOLOGIST:  Albina Billet, MD, [place]:  Marijo Conception. Wall, M.D.   PROCEDURE:  Left and right heart catheterization/coronary arteriography.   INDICATION:  Evaluate patient with cardiomyopathy.   PROCEDURE NOTE:  Left heart catheterization was performed via the right  femoral artery, right heart catheterization performed via the right femoral  vein. Both vessels were cannulated using anterior wall puncture. A #6-French  arterial sheath and a #7-French venous sheath were inserted via the modified  Seldinger technique. A preformed Judkins' and a pigtail catheter were  utilized. The patient tolerated the procedure well.   RESULTS:   HEMODYNAMICS:  RA mean 9, RV 60/11 with a mean of 13. PA 60/34 with a mean  of 47. Pulmonary capillary wedge pressure mean of 32, aorta 121/108, LV  121/32. Coronaries--the left main was normal. The LAD was normal wrapping  the apex. There were two small diagonals which were normal. The circumflex  and AV groove was normal. There was an obtuse marginal branch which was  large and normal. The right coronary artery was large and dominant. PDA and  posterolateral were moderate size and normal.   LEFT VENTRICULOGRAM:  The left ventriculogram was obtained in the RAO  projection. The EF was 25% with global hypokinesis.   CONCLUSION:  Nonischemic cardiomyopathy with moderate pulmonary hypertension  most likely related to her elevated EDP and pulmonary capillary wedge  pressure.   PLAN:  The patient will continue to have aggressive medical  management of  her nonischemic cardiomyopathy.           ______________________________  Minus Breeding, M.D.     JH/MEDQ  D:  01/18/2006  T:  01/20/2006  Job:  [id number]   cc:   Deniece Ree, M.D.  [facility] C. Wall, M.D.  [address]  [place] [address]

## 2011-03-10 NOTE — H&P (Signed)
[name] [name], [name] NO.:  [id number]   MEDICAL RECORD NO.:  [id number]          PATIENT TYPE:  INP   LOCATION:  4707                         FACILITY:  [facility]   PHYSICIAN:  [name] [name], M.D.DATE OF BIRTH:  [birth year]   DATE OF ADMISSION:  06/15/2005  DATE OF DISCHARGE:                                HISTORY & PHYSICAL   PRIMARY CARE PHYSICIAN:  None; but she does follow with Dr. Marveen [name] from  rheumatology.   CHIEF COMPLAINT:  Slurred speech and numbness of the right side.   HISTORY OF PRESENT ILLNESS:  The patient is a 30 year old African-American  female with a past medical history of lupus and obesity who presents to the  emergency room after she tells me that she has not been feeling well for 1  day.  She noticed that she has had general malaise with fever, and then this  afternoon she started noticing some weakness and numbness of her right side  upper and lower extremity, as well as some trouble talking.  She came into  the emergency room concerned that she may have had a stroke.  She tells me  that she has had a previous stroke in the past, although I can find no  record of this.  She was last here approximately a month and a half ago for  the same complaint of symptoms.  At that time, she was evaluated and sent  home.  Here in the emergency room, the patient was noted to have an elevated  temperature of 103.  Routine labs were drawn on the patient, and she was  noted to have a normal white count and shift; however, she was noted to have  a creatinine of 1.5, when approximately 6 weeks ago her creatinine was  normal.  The patient was given some Tylenol and IV fluids.  A CT scan of her  head was done, which was reportedly unremarkable.  Currently, she is drowsy  and quit tired, but tells me that all of her symptoms have resolved.  She  denies any headaches, vision changes, dysphagia, chest pain, palpitations,  shortness of breath, wheeze, cough,  abdominal pain, hematuria, dysuria,  constipation, diarrhea, focal extremity numbness, weakness, or pain.  She  was noted to also be tachycardic with a heart rate in the 120s.  This has  continued to persist.   REVIEW OF SYSTEMS:  Otherwise negative.   PAST MEDICAL HISTORY:  1.  Lupus.  2.  Obesity.  3.  Questionable CVA.  4.  Hypertension.   MEDICATIONS:  1.  Plaquenil 400 p.o. daily.  2.  Prednisone 10 p.o. daily.  3.  Lisinopril one pill p.o. daily (I am assuming this is 10 mg).  4.  Multivitamin p.o. daily.   ALLERGIES:  No known drug allergies.   SOCIAL HISTORY:  She denied any tobacco, alcohol, or drug use.   FAMILY HISTORY:  Noncontributory.   PHYSICAL EXAMINATION:  VITAL SIGNS:  The patient's vitals on admission  revealed a temperature of 103.3 (now down to 98.5), heart rate 125 (which is  staying that way), blood pressure 131/82, respirations  18, O2 saturation 95%  on room air.  GENERAL:  The patient is drowsy, but able to be awakened, and she is alert  and oriented.  HEENT:  Normocephalic and atraumatic.  Mucous membranes are slightly dry.  She has no carotid bruits.  HEART:  Regular rhythm.  Mild tachycardia.  LUNGS:  Clear to auscultation bilaterally.  No crackles or wheezing.  ABDOMEN:  Soft, obese, nontender.  Positive bowel sounds.  EXTREMITIES:  No clubbing or cyanosis.  There is 1+ pitting edema.  She is  noted to have some mild flank tenderness, but nothing severe.   LABORATORY WORK:  Sodium 135, potassium 4.1, chloride 105, bicarbonate 24,  BUN 20, creatinine 1.5, glucose 92, calcium 8.3.  White count of 3.2 with no  shift.  Hemoglobin and hematocrit of 11.2 and 33.5.  MCV of 82.  Platelet  count 258.  Urinalysis is only notable for protein greater than 300.  On  microscopic, she had 3-6 white cells, 7-10 red cells, rare bacteria and  mucous.   ASSESSMENT AND PLAN:  1.  Right-sided numbness and slurred speech.  It is possible that with      patient's  lupus, she may be in a hypercoagulable state; however, she      seems to have had similar symptoms in the past; I neglected to mention      she has a history of migraines with similar symptoms, and that is what      this may be.  To be thorough, I want to go ahead and check an MRI of the      head and rule out cerebrovascular accident.  Her symptoms appear to have      already resolved.  2.  Acute renal insufficiency.  The patient's creatinine approximately 6      weeks ago was normal.  It is noted that she is on lisinopril, but it      appears she has not been on this for a while, and this may not be the      cause of her renal failure.  Will treat with IV fluids and reassess in      the morning.  Another acute possibility is that with the patient in a      hypercoagulable state, may have had a clot causing obstruction of a      kidney, although this is a much less likely diagnosis.  Will treat with      IV fluids.  If her creatinine stays the same, we could consider checking      a renal artery ultrasound in the morning, but I will defer this to my      partner.  3.  Lupus.  Continue Plaquenil and prednisone.  4.  Hypertension.  For now, given patient's acute renal failure and her      blood pressure is stable, we will hold her lisinopril.  5.  In regard to patient's tachycardia, she appears to be slightly      clinically dry (will treat with IV fluids), which may be the cause of      her numbness and symptoms, as dehydration may have precipitated a      migraine headache.  6.  In regard to patient's temperature, we have not yet ruled out an      infection, although it could be secondary to stress from a lupus flare      up.  Will continue to watch the patient and reassess  her in the morning      following some symptomatic care overnight.  If she is febrile,      complaining of any type of pain, or radiologies are unclear, will ask      Dr. Marveen [name] for a rheumatology  consult.     [name] [name], M.D.  Electronically Signed     SKK/MEDQ  D:  06/15/2005  T:  06/16/2005  Job:  [id number]

## 2011-03-10 NOTE — Discharge Summary (Signed)
NAMEALIZON, [name] [name] NO.:  [id number]   MEDICAL RECORD NO.:  [id number]          PATIENT TYPE:  INP   LOCATION:  4709                         FACILITY:  [facility]   PHYSICIAN:  Marijo Conception. Wall, M.D.   DATE OF BIRTH:  04-Aug-1981   DATE OF ADMISSION:  01/15/2006  DATE OF DISCHARGE:  01/23/2006                                 DISCHARGE SUMMARY   PROCEDURES:  1.  Cardiac catheterization.  2.  Coronary arteriogram.  3.  Left ventriculogram.  4.  2-D echocardiogram.  5.  Head CT without contrast.   TIME AT DISCHARGE:  Forty-one minutes.   PRIMARY DIAGNOSIS:  Congestive heart failure.   SECONDARY DIAGNOSES:  1.  Nonischemic cardiomyopathy with an ejection fraction of 25-30% and clean      cardiac cath this admission.  2.  Hypertension.  3.  Obesity.  4.  Lupus.  5.  History of lupus nephritis/renal insufficiency.  6.  Anemia.  7.  Episodic slurred speech and facial weakness, CT without acute      abnormality (seen by neuro, possible transient ischemic attack).   HOSPITAL COURSE:  [name] [name] is a 30 year old female with no known  history of coronary artery disease. She had exertional substernal chest pain  described as tightness that was episodic for a week, at least a 6/10.  She  also had dyspnea on exertion and lower extremity edema.  She came to the  emergency room and had pulmonary edema on her chest x-ray.  An  echocardiogram was performed in the emergency room showing an EF of 25-30%,  which was new.  Her last echocardiogram was 6 months ago and her EF at that  time was 40-50%.  She was admitted for further evaluation and treatment.   Her BNP was elevated at over 1,000.  She was put on IV Lasix and was  followed closely.  Her weight decreased about 5 pounds and her I's and O's  were negative by 5 liters.  As her volume status improved, her respiratory  status improved and she had no further episodes of chest pain.   Her cardiac enzymes were negative  for MI, but because of her left  ventricular dysfunction, a right and left heart catheterization was  performed to further evaluate her.  A right and left heart catheterization  showed no significant coronary artery disease and an EF of 25%.  It was felt  she had nonischemic cardiomyopathy with moderate pulmonary hypertension  related to elevated end-diastolic pressures and elevated wedge of 32.  Her  PAS was 60.   After the procedure, she had right foot numbness and tingling to right upper  extremity, as well as a facial droop and slurred speech.  A stat CT was  performed, which showed no acute abnormality.  She was significantly  hypertensive with a blood pressure of 147/103.  A neuro consult was called.   Mr. Zadra was seen by Dr. Doy Mince and an MRI and MRA of the brain were  checked.  He felt that if those did not show extensive white matter disease,  anticoagulation with Coumadin was favored  given her low ejection fraction.  She was started on heparin.  The MRI and MRA  showed no ischemia and no  significant small vessel disease.  She had mild narrowing of the cerebral  vessels, which was not enough to arouse particular concern.  They felt that  Coumadin with an INR of between 2 and 3 was indicated as long as her EF was  in the 25% range.  If her EF improves, it can be discontinued.  She was  started on Coumadin.   She was seen by cardiac rehab and given instructions to increase her  ambulation.  Her renal function showed some changes because of the diuresis  with a BUN of 27 and a creatinine of 1.4.  As her Lasix dose was adjusted,  she normalized and at discharge her BUN is 21 with a creatinine of 1.2.   [name] [name] had some anemia with a hemoglobin of 9.7, but she was negative  for fecal occult blood.  No iron studies are currently available, but these  can be performed as an outpatient.  Her hematocrit is 29.7 and this is  stable.   As her respiratory status reached  baseline and her INR improved, Dr. Verl Blalock  evaluated her considered her stable for discharge with outpatient followup  arranged.   LABORATORY VALUES:  Total cholesterol 143, triglycerides 262, HDL 31, LDL  60.  Sodium 136, potassium 4.7, chloride 109, CO2 22, BUN 21, creatinine  1.2, glucose 81.  Hemoglobin 9.7, hematocrit 29.7, WBCs 4.7, platelets 346.  Fecal occult blood was negative.  BNP on January 21, 2006, was 352, which is  decreased.   DISCHARGE INSTRUCTIONS:  1.  Activity level is to be increased slowly per rehab guidelines.  2.  She is to weigh herself every day.  3.  She is to stick to a low-sodium diet.  4.  She is to contact us for any problems with the cath site.  5.  She is to follow up with Dr. Verl Blalock on April 12th, and with the Coumadin      clinic on April 5th.   DISCHARGE MEDICATIONS:  1.  Plaquenil 200 mg b.i.d.  2.  Prednisone 10 mg a day.  3.  Digoxin 0.125 mg daily.  4.  Keflex 750 mg b.i.d.  5.  Aldactone 25 mg b.i.d.  6.  Coumadin 5 mg daily or as directed.  7.  Lasix 20 mg daily.  8.  Coreg 50 mg b.i.d.  9.  Lisinopril 10 mg a day.  10. She is to discontinue aspirin 81 mg a day and Cartia XT 240 mg a day.      Rosaria Ferries, P.A. [place]. Wall, M.D.  Electronically Signed    RB/MEDQ  D:  01/23/2006  T:  01/24/2006  Job:  [id number]   cc:   Thomas C. Wall, M.D.  [address]  [place] [address]   Alfredo Rivadeneira, [facility]

## 2011-03-10 NOTE — Discharge Summary (Signed)
NAMESAMANTA, SUMIDA NO.:  [id number]   MEDICAL RECORD NO.:  [id number]          PATIENT TYPE:  INP   LOCATION:  4707                         FACILITY:  [facility]   PHYSICIAN:  Annita Brod, M.D.DATE OF BIRTH:  [birth year]   DATE OF ADMISSION:  06/15/2005  DATE OF DISCHARGE:  06/19/2005                                 DISCHARGE SUMMARY   ATTENDING PHYSICIAN:  Annita Brod, MD.   RHEUMATOLOGIST:  Anson Oregon, MD.   CONSULTATIONSMaudie Flakes. Hassell Done, MD, Nephrology.   DISCHARGE DIAGNOSES:  1.  Lupus nephritis flair.  2.  Renal insufficiency secondary to #1.  3.  Lupus.  4.  Obesity.  5.  History of hypertension.  6.  History of questionable cerebrovascular accident.   DISCHARGE MEDICATIONS:  1.  Plaquenil 200 mg one p.o. b.i.d.  2.  Prednisone 60 mg p.o. daily x6 weeks followed by a taper.  3.  Lisinopril 10 mg p.o. daily.  4.  Multivitamin p.o. daily.   HOSPITAL COURSE:  The patient is a 30 year old, African-American female with  a past medical history of lupus and obesity, who presented to the emergency  room on June 15, 2005, with a one-day history of malaise, and weakness and  numbness of her right side.  She was concerned she may have had a stroke.  She was noted in the emergency room to have a creatinine of 1.5, which was  found to be acute compared to a creatinine of 0.9 six weeks prior as well as  a temperature of 103.  A CT of her head was unremarkable.  The patient was  admitted for evaluation.  In regards to her right-sided numbness, a CT was  negative, MRI was unremarkable.  The symptoms resolved and it was felt  likely this was not a stroke.  In regards to her renal insufficiency, the  patient had a sed rate ordered, which was found to be at 54 compliment  level, C3 and C4 found to be normal.  The patient's creatinine on the  following day spiked to 2.1.  It was felt that this may be lupus nephritis.  Nephrology was consulted,  who recommended checking lupus studies and  starting the patient on IV steroids.  They recommended an additional renal  biopsy.  In discussion with the patient, she, herself, admitted that she had  not been taking her medications and had a problem getting to be seen by her  rheumatologist.  It was advised that to control this flair up she should  stay on a high dose of steroids, specifically Prednisone 60 mg p.o. daily  for the next six weeks.  This would help control the flair up.  Nephrology  also recommended a renal biopsy; however, the patient declined this and  wanted to go home.  She was felt to be medically stable, but Nephrology did  advise the patient on day of discharge that if another flare did occur of  lupus involving her kidneys, she would need a renal biopsy.  She said that  she would agree to do this.  In regards to her  hypertension, her blood  pressure initially was within the normal range; however, her blood pressure  started to climb up.  On August 27th, her blood pressure was 158/110.  She  was also complaining of some pain.  After receiving morphine and Clonidine,  her blood pressure improved and her pain resolved.  It is felt that the  blood pressure elevation may be in part due to her kidney problems, however,  most likely was secondary to pain and stress.  The final will be for patient  to be discharged to home.   FOLLOWUP:  1.  She will follow up with [facility] in the next week with Dr.      Darron Doom, who she has been established with in the past.  They will check      her creatinine, potassium, and other electrolytes.  2.  In addition, patient will follow up with Dr. Hassell Done in four weeks in the      Nephrology Clinic.   SPECIAL INSTRUCTIONS:  The patient will continue on her high dose steroids,  which will be tapered off after six weeks.   DISPOSITION:  The patient's overall disposition has improved.   DIET:  Her diet will be a low sodium diet.    ACTIVITY:  Her activity will be as tolerated.      Annita Brod, M.D.  Electronically Signed     SKK/MEDQ  D:  06/19/2005  T:  06/19/2005  Job:  [id number]   cc:   Santiago Glad L. Darron Doom, M.D.  [address] [place] [address]  Fax: [phone]

## 2011-03-10 NOTE — Assessment & Plan Note (Signed)
[place] OFFICE NOTE   NAME:[name] [name] SMIGIELSKI                  MRN:          [id number]  DATE:11/01/2006                            DOB:          [birth year]    [name] [name] comes back today for followup of her nonischemic  cardiomyopathy and uncontrolled hypertension.   We added spironolactone, continued on her Lasix.  Followup blood work  was stable.   A 2D echocardiogram showed complete normalization of left ventricular  function!  EF of 60%.  There was mild LVH.   Her swelling has been better.  Her blood pressure has been running  around 120-130/80 at home.   EXAM:  Her blood pressure is 134/80, pulse is 90 and regular, weight is  230 up 6.  She is in no acute distress.  The rest of her exam is unchanged.  There is no edema.   ASSESSMENT AND PLAN:  I am delighted [name] [name] received full recovery  of her left ventricular function.  I have made no change in her program.  I will see her back in a year.  She will call us if she has any  difficulties.     Thomas C. Verl Blalock, MD, [facility]  Electronically Signed    TCW/MedQ  DD: 11/01/2006  DT: 11/01/2006  Job #: [id number]

## 2011-03-10 NOTE — Assessment & Plan Note (Signed)
[place]                              CARDIOLOGY OFFICE NOTE   NAME:[name], [name] SOELBERG                  MRN:          [id number]  DATE:07/19/2006                            DOB:          08-30-1981    CARDIOLOGIST:  Marijo Conception. Wall, MD, [facility]   HISTORY OF PRESENT ILLNESS:  [name] [name] is a 30 year old female patient  followed by Dr. Verl Blalock with a history of known ischemic cardiomyopathy.  She  had normal coronary arteries by catheterization in March, 2007.  Last saw  her in June, 2007.  At that time, her blood pressure was high, and we added  BiDil.  We have not seen her since then.  She is also not following up in  the Coumadin clinic.  She ran out of her medicines about two days ago and  has not taken any of her medicines since then.  She has occasional  headaches.  Denies any chest pain, shortness of breath, syncope, presyncope,  orthopnea, paroxysmal nocturnal edema.  She has had some pedal edema.  She  notes that it comes and goes.   MEDICATIONS:  1. Plaquenil 10 mg b.i.d.  2. Prednisone 10 mg daily.  3. Digoxin 0.25 mg a day.  4. Coumadin as directed.  5. Lasix 20 mg daily.  6. Coreg 50 mg b.i.d.  7. Lisinopril 10 mg b.i.d.  8. CellCept 500 mg b.i.d.   PHYSICAL EXAMINATION:  GENERAL:  She is a well-developed and well-nourished  female in no distress.  VITAL SIGNS:  Blood pressure is 155/106.  Pulse 89.  Weight 216 pounds.  HEENT:  Unremarkable.  NECK:  Without JVD.  CARDIAC:  S1 and S2.  Regular rate and rhythm.  LUNGS:  Clear to auscultation bilaterally.  ABDOMEN:  Soft and nontender with normoactive bowel sounds.  No  organomegaly.  EXTREMITIES:  Edema 1+ bilaterally.  Calves are soft and nontender.  SKIN:  Warm and dry.   Electrocardiogram reveals a sinus rhythm with a heart rate of 89, prolonged  Q-T with a QTT of 491 ms.  No acute changes.   IMPRESSION:  1. Chronic systolic congestive heart failure.  2. Nonischemic  cardiomyopathy with ejection fraction of 25%.  3. Normal coronary arteries by catheterization in 2007.  4. Systemic lupus erythematosus.  5. Uncontrolled hypertension.  6. History of renal insufficiency.  7. Anemia.  8. Coumadin therapy.   PLAN:  Patient presents to the office today for followup.  She is  noncompliant with followup and medications.  I have explained to her  explicitly today how important it is for her to take her medications.  I  have also explained to her how important it is for her to monitor her  Coumadin.  We will refill her medications today and have her follow up in  the next 1-2 weeks.  She can either see me on a day that Dr. Verl Blalock is here or  Dr. Verl Blalock.  She had some lab work come back in June that showed anemia.  We  tried to refer her to hematology, but she never went.  We will  recheck her  CBC today.  She does have a prolonged Q-T on her electrocardiogram today.  I  have reviewed her medications.  I do not think there is anything she is on  that would prolong her Q-T.  I will get her a list of medications that she  should not take with prolonged Q-T, get a Holter monitor to rule out any  ectopy.  We will also check a BMET and magnesium.  I will have her follow  up, as noted above, in the next 1-2 weeks.      ______________________________  Richardson Dopp, PA-C    ______________________________  Loretha Brasil. Lia Foyer, MD, [facility]    SW/MedQ  DD:  07/19/2006  DT:  07/20/2006  Job #:  [id number]   cc:   Liliana Cline, M.D., in [facility]

## 2011-03-10 NOTE — Consult Note (Signed)
[name], [name] NO.:  [id number]   MEDICAL RECORD NO.:  [id number]          PATIENT TYPE:  INP   LOCATION:  4709                         FACILITY:  [place]   PHYSICIAN:  Legrand Como L. Reynolds, M.D.DATE OF BIRTH:  1981-07-28   DATE OF CONSULTATION:  01/18/2006  DATE OF DISCHARGE:                                   CONSULTATION   REQUESTING PHYSICIAN:  Minus Breeding, MD.   REASON FOR EVALUATION:  Right-sided numbness and possible TIA.   HPI:  This is a initial inpatient consultation and evaluation of this 30-  year-old woman with a past history which includes lupus with nephritis and  arthritis, as well as hypertension.  She was admitted on January 15, 2006 with  progressive dyspnea on exertion, orthopnea, PND, lower extremity edema, and  some chest pain.  She was known previously to have mild left ventricular  dysfunction with EF of 45% by echocardiogram in October of 2006, but  echocardiogram done on March 26 demonstrated an EF of 20-30%.  She was  subsequently admitted and has been aggressively diuresed with an improvement  in her symptoms.  Because of her cardiomyopathy, she underwent cardiac  catheterization today to exclude coronary artery disease and this was  basically negative.  After cath today, the patient noticed sudden onset this  evening, about 6 p.m., of numbness on the right lower extremity, which moved  to the right upper extremity and the right side of the face, as well as some  slurred speech, said it lasted for about 10 minutes and completely resolved,  and she has felt fine since that time.  The patient reports that she has had  symptoms like this before, particularly she recalled having it in 2005 where  she had similar symptoms, which were not as severe and did not last as long.  She stated that sometimes these involved the left side and sometimes the  right side.  She also states she had a very transient episode lasting about  5 minutes  last night in which she lost some of the vision out of her left  eye.  She reports occasionally having some palpitations, although not  associated with the event that she had this evening.  She has also had chest  pain and shortness of breath, but these are better.  She does report having  an occasional headache that she describes as a very severe pain associated  with nausea and vomiting and throbbing in character, usually over the left  side of the head, but she has not really had that this evening.  She does  get that about once every 2 weeks.  Neurologic consultation was requested  for further considerations about her symptoms.   MEDICAL HISTORY:  As above.  She has had renal biopsy in the past.  She is  followed by a rheumatologist at [facility].   FAMILY/SOCIAL/REVIEW OF SYSTEMS:  As outlined in the admission H&P of January 15, 2006, which is reviewed.   MEDICATIONS:  Her current medications include aspirin, Lasix, Plaquenil,  prednisone, lisinopril, digoxin, Coreg, spironolactone, Lovenox, potassium,  and Keflex.  PHYSICAL EXAMINATION:  VITAL SIGNS:  Temperature 99.1, blood pressure  135/90, pulse 96, respirations 18, O2 sat 100% on room air.  GENERAL EXAMINATION:  This is a healthy-appearing woman who is seen in no  distress.  HEAD:  Cranium is normocephalic, atraumatic.  OROPHARYNX:  Benign.  NECK:  Supple without carotid or supraclavicular bruits.  HEART:  Regular rate and rhythm without murmurs.  NEUROLOGIC EXAM/MENTAL STATUS:  She is awake, alert, fully oriented to time,  place and person.  Recent and remote memory are intact.  Attention span,  concentration, fund of knowledge are all appropriate.  Speech is fluent and  not dysarthric.  There is no defect to confrontational naming, and she can  repeat a phrase.  Mood is euthymic and affect appropriate.  CRANIAL NERVES:  Funduscopic exam is benign.  Pupils equal and briskly  reactive.  Extraocular movements full without  nystagmus.  Visual fields full  to confrontation.  Hearing is intact to conversational speech.  Facial  sensation is intact to pinprick.  Face, tongue and palate move normally and  symmetrically.  Shoulder shrug strength is normal.  MOTOR TESTING:  Normal bulk and tone.  Normal strength is all tested  extremity muscles.  SENSATION:  She reported to me there is pinprick sensation of the fingers of  the right hand, otherwise intact to light touch, vibration, and pinprick  throughout.  COORDINATION:  Rapid alternating movements performed well.  Finger-to-nose  and heel-to-shin performed well.  GAIT:  She arises from a chair easily and her stance is normal.  She is able  to ambulate without difficulty.  Reflexes are 2+ and symmetric.  Toes are  downgoing.   LABORATORY REVIEW:  BMET from this morning remarkable for an elevated BUN  and creatinine of 25 and 1.3, respectively.  Coags this morning are normal.  Cardiac enzymes are minimally elevated.  CBC from yesterday:  White count  5.6, hemoglobin 9.9, platelets 255,000.   IMPRESSION:  1.  Transient right-sided hemisensory changes and dysarthria, in a patient      with known risk factors.  This likely represents a transient ischemic      attack, small vessel versus cardioembolic.  2.  Hypertension.  3.  Nonischemic cardiomyopathy with an ejection fraction of 25%.  4.  Systemic lupus erythematosus with known nephropathy.   RECOMMENDATIONS:  We will check MRI of the brain and MRA of the intracranial  circulation.  We will also check carotid and transcranial Dopplers.  If the  above do no show significant stenoses or extensive white matter  disease, I would favor anticoagulation given her low ejection fraction.  Would also consider transesophageal echocardiogram to exclude marantic  endocarditis.  Social service to follow.   Thank you for the consultation.      Michael L. Doy Mince, M.D. Electronically Signed     MLR/MEDQ  D:   01/18/2006  T:  01/20/2006  Job:  [id number]   cc:   Thomas C. Wall, M.D.  [address]  [address]  [place]  [place] [address]   Minus Breeding, M.D.  [address] [place]  [place] [address]

## 2011-03-10 NOTE — Assessment & Plan Note (Signed)
[place]                              CARDIOLOGY OFFICE NOTE   NAME:[name] [name] [name] LEHMANN                  MRN:          [id number]  DATE:08/03/2006                            DOB:          1981-08-29    CARDIOLOGIST:  Marijo Conception. Wall, MD   PRIMARY CARE PHYSICIAN:  [name] [name] [name] will be established with Bruce H.  Swords, MD, next week.   HISTORY OF PRESENT ILLNESS:  [name] [name] [name] is a very pleasant 30 year old  female [name] [name] followed by Dr. Verl Blalock with a history of nonischemic  cardiomyopathy as well as a history of possible TIAs in [name] past, on chronic  Coumadin therapy.  She has an EF of 25%.  I saw her last on July 19, 2006, for follow-up.  She had been noncompliant with her medications and we  got her back on her medicines and had her come back today for follow-up.  Of  note, she did have some QT prolongation on her electrocardiogram.  I had her  undergo a Holter monitor.  QT looked normal on this, and there were no  significant arrhythmias.  She had also had some documented anemia in [name]  past and we tried to send her to hematology, but she never went.  Her recent  follow-up hemoglobin was 10.7, hematocrit 32.7, MCV 79.8.  Her INR was 1.  We asked her to get back into [name], and they actually saw her  today and INR was 2.2.  She notes she is doing well without any chest pain,  shortness of breath, orthopnea, paroxysmal nocturnal dyspnea, palpitations  or syncope.  She did note that she had some visual symptoms 2 days ago.  She  does have a history of visual changes as well as left-sided weakness and  slurred speech in [name] past.  She was actually evaluated by Dr. Doy Mince when  she was in [name] hospital last in March 2007 for congestive heart failure for  these same symptoms.  He was concerned that she possibly had had a TIA.  Apparently her MRA and MRI were both normal.  He recommended continuing on  Coumadin therapy.  [name]  [name] [name] actually saw Dr. Leonie Man again on January 29, 2006, when she was admitted for sudden onset of left face and arm  paresthesias that were progressive.  Dr. Leonie Man at that time noted that her  INR was subtherapeutic.  In his consultation note he suggested that [name]  progressive nature of her symptoms would argue against true transient  ischemic attacks and would favor migraines.  However, she lacked [name] typical  history to suggest [name] same.  He recommended continuing on Coumadin therapy.  [name] [name] [name] tells me today that 2 days ago she had some visual changes in  her left eye.  [name] left half of her visual field in her left eye was blurry  for about an hour, then went away.  She denied any weakness in her  extremities.  She denied any slurred speech.  [name] symptoms resolved and now  she notes that she feels back to normal.   MEDICATIONS:  1. Plaquenil 200 mg b.i.d.  2. Prednisone 10 mg daily.  3. Digoxin 0.125 mg daily.  4. Coumadin as directed.  5. Lasix 20 mg daily.  6. Coreg 50 mg b.i.d.  7. Lisinopril 10 mg b.i.d.  8. CellCept 500 mg b.i.d.   ALLERGIES:  No known drug allergies.   PHYSICAL EXAMINATION:  GENERAL:  She is a well-developed, well-nourished  female in no acute distress.  VITAL SIGNS:  Blood pressure is 144/90 in [name] right and 150/92 in [name] left,  pulse 86, weight 220 pounds.  HEENT:  Head normocephalic, atraumatic.  Eyes:  PERRLA, EOMI, sclerae are  clear.  Fundi within normal limits bilaterally.  LYMPHATIC:  Without lymphadenopathy.  ENDOCRINE:  Without thyromegaly.  CARDIAC:  S1, S2, regular rate and rhythm.  LUNGS:  Clear to auscultation bilaterally without wheezing, rhonchi or  rales.  ABDOMEN:  Soft, nontender, with normoactive bowel sounds, no organomegaly.  EXTREMITIES:  Without edema.  Calves are soft, nontender.  SKIN:  Warm and dry.  NEUROLOGIC:  She is alert and oriented x3.  Cranial nerves II-XII are  grossly intact.  Rapid alternating movements,  finger-to-nose, heel-to-shin  all without deficits.  Strength is 5/5 in all extremity and axial groups.  Sensation is intact.   IMPRESSION:  1. Chronic systolic congestive heart failure.  2. Nonischemic cardiomyopathy with an ejection fraction of 25%.  3. Normal coronary arteries by catheterization in 2007.  4. Transient visual field deficits, question transient ischemic attacks.  5. Systemic lupus erythematosus.  6. Hypertension, better control.  7. History of renal insufficiency.  8. Normocytic anemia.  9. Coumadin therapy.   PLAN:  I have discussed [name] [name] [name]'s case today with Dr. Verl Blalock.  Given her  low ejection fraction and history in [name] past, we have to assume that these  symptoms are most likely TIAs until proven otherwise.  Dr. Verl Blalock suggests  that we try to keep her INR close to 3 because of this reason.  I have  explained to [name] [name] [name] [name] importance of this.  I talked to her today  about how devastating it would be for her to have a stroke at her young age.  She does need to have her medicines titrated some, and I will increase her  lisinopril to 20 mg twice a day.  We will recheck a BMET in a week because  of this to recheck her renal function and potassium.  She will follow up at  [name] next week and Dr. Verl Blalock in about 6 weeks.   [name] [name] [name] can follow up for evaluation of her anemia with Dr. Leanne Chang next  week when she sees him for establishment.      ______________________________  Richardson Dopp, PA-C    ______________________________  Marijo Conception. Verl Blalock, MD, [facility]    SW/MedQ  DD:  08/03/2006  DT:  08/03/2006  Job #:  [id number]   cc:   Darrick Penna. Swords, MD

## 2011-03-16 ENCOUNTER — Other Ambulatory Visit (INDEPENDENT_AMBULATORY_CARE_PROVIDER_SITE_OTHER): Payer: BC Managed Care – PPO

## 2011-03-16 ENCOUNTER — Other Ambulatory Visit (INDEPENDENT_AMBULATORY_CARE_PROVIDER_SITE_OTHER): Payer: BC Managed Care – PPO | Admitting: Internal Medicine

## 2011-03-16 ENCOUNTER — Other Ambulatory Visit: Payer: Self-pay | Admitting: Internal Medicine

## 2011-03-16 DIAGNOSIS — I272 Pulmonary hypertension, unspecified: Secondary | ICD-10-CM

## 2011-03-16 DIAGNOSIS — I2789 Other specified pulmonary heart diseases: Secondary | ICD-10-CM

## 2011-03-16 DIAGNOSIS — I270 Primary pulmonary hypertension: Secondary | ICD-10-CM

## 2011-03-16 LAB — HEPATIC FUNCTION PANEL
ALT: 18 U/L (ref 0–35)
Albumin: 3.7 g/dL (ref 3.5–5.2)
Bilirubin, Direct: 0 mg/dL (ref 0.0–0.3)
Total Protein: 7.7 g/dL (ref 6.0–8.3)

## 2011-03-16 LAB — HCG, QUANTITATIVE, PREGNANCY: hCG, Beta Chain, Quant, S: 0.44 m[IU]/mL

## 2011-03-22 ENCOUNTER — Other Ambulatory Visit: Payer: Self-pay | Admitting: Internal Medicine

## 2011-04-12 ENCOUNTER — Ambulatory Visit: Payer: BC Managed Care – PPO | Admitting: Internal Medicine

## 2011-04-13 ENCOUNTER — Ambulatory Visit (INDEPENDENT_AMBULATORY_CARE_PROVIDER_SITE_OTHER): Payer: BC Managed Care – PPO | Admitting: Internal Medicine

## 2011-04-13 ENCOUNTER — Encounter: Payer: Self-pay | Admitting: Internal Medicine

## 2011-04-13 VITALS — BP 122/78 | HR 76 | Temp 98.7°F | Ht 67.0 in | Wt 240.2 lb

## 2011-04-13 DIAGNOSIS — I2789 Other specified pulmonary heart diseases: Secondary | ICD-10-CM

## 2011-04-13 DIAGNOSIS — L930 Discoid lupus erythematosus: Secondary | ICD-10-CM

## 2011-04-13 DIAGNOSIS — R0602 Shortness of breath: Secondary | ICD-10-CM

## 2011-04-13 NOTE — Patient Instructions (Signed)
Please have CPST bike test with Exertional challenge for asthma to figure out yoru shortness of breath WE will hold off on methacholine challenge test for asthma for now REturn to see me after above to go over results and discuss fate of letairis. For now continue letairis

## 2011-04-13 NOTE — Progress Notes (Signed)
Subjective:    Patient ID: [name] [name], female    DOB: [birth year], 30 y.o.   MRN: [id number]  HPI  30 year old female, Obese, Non smoker. > 20# weight gain since 2005. Mulitorrgan lupous (who class 2 nephritis, joints, raynaud - on cellcept, prednisone, plaquenil - ongoing active lupus 05/2010). NICM ef 25% in 2007 with normal left heart cath with resolution to normal ef by oct 2010. Presented August 2011 with doe x few years. CT chest sept 2011 - normal parenchyma. Pulmnary hypertension - rt heart cath 07/16/2010 - group 1 and 2 pulmonary hypertension (PA mean 38, PCWP 18, TPG 20, PVR 3.6 wood units, 480 m on 82mwd, lowest pulse ox 93%). Started letairis Jul 28, 2010 with 47mwd of 467m at commencement. Increase letairis to 10mg  daily 09/26/2010  OV 04/14/2011. Still dyspneic despite letairis intiation and increase in dose. Qualifies dyspnea as variable; sometimes at work with exertion for short distances but other times able to walk 1-2 miles without problems. Hot climate makes it worse.  Has associated chest tightness/pain. Dyspnea relieved by rest. Feels letairis has not helped at all. Weight has been variable; comparing prior weight appears stable. BMI is 37.6 - a year ago it was 37. In terms of lupus Rx: cellcept changed to immuran but prednisone and plaquenil are ongoing.    Review of Systems  Constitutional: Negative for fever and unexpected weight change.  HENT: Negative for ear pain, nosebleeds, congestion, sore throat, rhinorrhea, sneezing, trouble swallowing, dental problem, postnasal drip and sinus pressure.   Eyes: Negative for redness and itching.  Respiratory: Positive for chest tightness and shortness of breath. Negative for cough and wheezing.   Cardiovascular: Negative for palpitations and leg swelling.  Gastrointestinal: Negative for nausea and vomiting.  Genitourinary: Negative for dysuria.  Musculoskeletal: Negative for joint swelling.  Skin: Negative for rash.    Neurological: Positive for numbness. Negative for headaches.  Hematological: Does not bruise/bleed easily.  Psychiatric/Behavioral: Negative for dysphoric mood. The patient is not nervous/anxious.        Objective:   Physical Exam  Vitals reviewed. Constitutional: She is oriented to person, place, and time. She appears well-developed and well-nourished. No distress.       obese  HENT:  Head: Normocephalic and atraumatic.  Right Ear: External ear normal.  Left Ear: External ear normal.  Mouth/Throat: Oropharynx is clear and moist. No oropharyngeal exudate.  Eyes: Conjunctivae and EOM are normal. Pupils are equal, round, and reactive to light. Right eye exhibits no discharge. Left eye exhibits no discharge. No scleral icterus.  Neck: Normal range of motion. Neck supple. No JVD present. No tracheal deviation present. No thyromegaly present.  Cardiovascular: Normal rate, regular rhythm, normal heart sounds and intact distal pulses.  Exam reveals no gallop and no friction rub.   No murmur heard.      Loud P2  Pulmonary/Chest: Effort normal and breath sounds normal. No respiratory distress. She has no wheezes. She has no rales. She exhibits no tenderness.  Abdominal: Soft. Bowel sounds are normal. She exhibits no distension and no mass. There is no tenderness. There is no rebound and no guarding.  Musculoskeletal: Normal range of motion. She exhibits no edema and no tenderness.  Lymphadenopathy:    She has no cervical adenopathy.  Neurological: She is alert and oriented to person, place, and time. She has normal reflexes. No cranial nerve deficit. She exhibits normal muscle tone. Coordination normal.  Skin: Skin is warm and dry. No rash noted.  She is not diaphoretic. No erythema. No pallor.       Dermal lupus - dry skin  Psychiatric: She has a normal mood and affect. Her behavior is normal. Judgment and thought content normal.          Assessment & Plan:

## 2011-04-15 ENCOUNTER — Encounter: Payer: Self-pay | Admitting: Internal Medicine

## 2011-04-15 NOTE — Assessment & Plan Note (Signed)
Per Dr Rob Hickman

## 2011-04-15 NOTE — Assessment & Plan Note (Signed)
It appears she has had no symptomatic improvement in dyspnea despite treatment of pulmonary hypertension. TAckling her dyspnea is challenging. Obesity, Deconditioning, Left heart issues, chronic steroid myopathy can al be making her dyspneic. Will get CPST bike stress test as both baseline and also to help id etiology of dyspnea. I will see her after CPST. Based on results or regardless I will stop letairis and monitor her.  Depending on course, might need serial CPST  Note: discussed abstinene precaution on letairis and she confirms it.

## 2011-04-24 ENCOUNTER — Ambulatory Visit (HOSPITAL_COMMUNITY): Payer: BC Managed Care – PPO

## 2011-04-25 ENCOUNTER — Ambulatory Visit (HOSPITAL_COMMUNITY): Payer: BC Managed Care – PPO | Attending: Internal Medicine

## 2011-04-25 DIAGNOSIS — J45909 Unspecified asthma, uncomplicated: Secondary | ICD-10-CM | POA: Insufficient documentation

## 2011-04-25 DIAGNOSIS — R0602 Shortness of breath: Secondary | ICD-10-CM | POA: Insufficient documentation

## 2011-05-11 ENCOUNTER — Encounter (HOSPITAL_COMMUNITY): Payer: BC Managed Care – PPO | Attending: Nephrology

## 2011-05-11 DIAGNOSIS — D509 Iron deficiency anemia, unspecified: Secondary | ICD-10-CM | POA: Insufficient documentation

## 2011-05-17 ENCOUNTER — Encounter (HOSPITAL_COMMUNITY): Payer: BC Managed Care – PPO

## 2011-05-18 ENCOUNTER — Encounter (HOSPITAL_COMMUNITY): Payer: BC Managed Care – PPO

## 2011-05-19 DIAGNOSIS — R0602 Shortness of breath: Secondary | ICD-10-CM

## 2011-05-31 ENCOUNTER — Encounter: Payer: Self-pay | Admitting: Internal Medicine

## 2011-08-01 ENCOUNTER — Telehealth: Payer: Self-pay | Admitting: Internal Medicine

## 2011-08-01 NOTE — Telephone Encounter (Signed)
void

## 2011-08-01 NOTE — Telephone Encounter (Signed)
She was supposed to have had CPST and then see me. What happened ?

## 2011-08-02 LAB — ANTI-NUCLEAR AB-TITER (ANA TITER): ANA Titer 1: 1:640 {titer} — ABNORMAL HIGH

## 2011-08-02 LAB — ANTI-DNASE B ANTIBODY: Anti-DNAse-B: <70 U/mL (ref 0–120)

## 2011-08-02 LAB — SEDIMENTATION RATE: Sed Rate: 102 — ABNORMAL HIGH

## 2011-08-03 LAB — COMPREHENSIVE METABOLIC PANEL
ALT: 18
AST: 24
Alkaline Phosphatase: 77
CO2: 26
Chloride: 105
GFR calc Af Amer: >60
GFR calc non Af Amer: 60 — ABNORMAL LOW
Glucose, Bld: 94
Potassium: 3.9
Sodium: 137

## 2011-08-03 LAB — I-STAT 8, (EC8 V) (CONVERTED LAB)
Acid-base deficit: 2
TCO2: 25
pCO2, Ven: 46.3
pH, Ven: 7.322 — ABNORMAL HIGH

## 2011-08-03 LAB — D-DIMER, QUANTITATIVE: D-Dimer, Quant: 1.69 — ABNORMAL HIGH

## 2011-08-03 LAB — URINALYSIS, ROUTINE W REFLEX MICROSCOPIC
Glucose, UA: NEGATIVE
Leukocytes, UA: NEGATIVE
Protein, ur: >300 — AB
Specific Gravity, Urine: 1.018
pH: 6

## 2011-08-03 LAB — CARDIAC PANEL(CRET KIN+CKTOT+MB+TROPI)
CK, MB: 2.9
CK, MB: 5.8 — ABNORMAL HIGH
Total CK: 614 — ABNORMAL HIGH

## 2011-08-03 LAB — URINE MICROSCOPIC-ADD ON

## 2011-08-03 LAB — CBC
Hemoglobin: 9.1 — ABNORMAL LOW
MCHC: 32.9
RBC: 3.45 — ABNORMAL LOW
WBC: 5

## 2011-08-03 LAB — POCT I-STAT CREATININE
Creatinine, Ser: 1.4 — ABNORMAL HIGH
Operator id: 198171

## 2011-08-03 LAB — POCT CARDIAC MARKERS
CKMB, poc: 7.7
Operator id: 198171
Troponin i, poc: <0.05

## 2011-08-03 LAB — C3 COMPLEMENT: C3 Complement: 61 — ABNORMAL LOW

## 2011-08-11 NOTE — Telephone Encounter (Signed)
CPST was done on 05-22-11 it is scanned into EPIC under procedure. [place] Bing, CMA

## 2011-08-29 ENCOUNTER — Telehealth: Payer: Self-pay | Admitting: Internal Medicine

## 2011-08-29 NOTE — Telephone Encounter (Signed)
Please give her appt to see first avail. Got note from Dr Estanislado Pandy that she fell off from fu and she needs  To come in

## 2011-08-30 NOTE — Telephone Encounter (Signed)
Did i tell you to giver he offive visit first avail ?

## 2011-08-31 NOTE — Telephone Encounter (Signed)
Yes see newest phone note.[name] [name], CMA

## 2011-09-04 NOTE — Telephone Encounter (Signed)
LMtcbx1 to schedule f/u first available. [place] Bing, CMA

## 2011-09-05 ENCOUNTER — Encounter: Payer: Self-pay | Admitting: Internal Medicine

## 2011-09-07 NOTE — Telephone Encounter (Signed)
LMTCBx2 to set an appt. [place] Bing, CMA

## 2011-09-11 NOTE — Telephone Encounter (Signed)
appt set for 09-20-11. [place] Bing, CMA

## 2011-09-20 ENCOUNTER — Ambulatory Visit: Payer: BC Managed Care – PPO | Admitting: Internal Medicine

## 2011-09-22 ENCOUNTER — Telehealth: Payer: Self-pay | Admitting: Internal Medicine

## 2011-09-22 NOTE — Telephone Encounter (Signed)
Pt called because her new insurance will be through [facility] starting 10/24/2011. She is wanting to start back on Letairis but is not sure if this is covered. I told her that she can call the insurance company and they should be able to tell her if the medication is covered or not or she should try to get a formulary list. I also told her that if MR does start her back on this medication and it is not covered we can call the insurance for a prior authorization. The pt is due for follow-up with MR on 10/04/2011 and can discuss this with him at that time. She will try to get the formulary information before that visit.

## 2011-10-03 ENCOUNTER — Ambulatory Visit (INDEPENDENT_AMBULATORY_CARE_PROVIDER_SITE_OTHER): Payer: BC Managed Care – PPO

## 2011-10-03 DIAGNOSIS — L600 Ingrowing nail: Secondary | ICD-10-CM

## 2011-10-03 DIAGNOSIS — M109 Gout, unspecified: Secondary | ICD-10-CM

## 2011-10-03 DIAGNOSIS — Z23 Encounter for immunization: Secondary | ICD-10-CM

## 2011-10-04 ENCOUNTER — Ambulatory Visit: Payer: BC Managed Care – PPO | Admitting: Internal Medicine

## 2011-10-05 ENCOUNTER — Encounter: Payer: Self-pay | Admitting: Internal Medicine

## 2011-10-05 ENCOUNTER — Ambulatory Visit (INDEPENDENT_AMBULATORY_CARE_PROVIDER_SITE_OTHER): Payer: BC Managed Care – PPO | Admitting: Internal Medicine

## 2011-10-05 VITALS — BP 128/90 | HR 90 | Temp 98.4°F | Ht 67.0 in | Wt 246.2 lb

## 2011-10-05 DIAGNOSIS — I2789 Other specified pulmonary heart diseases: Secondary | ICD-10-CM

## 2011-10-05 MED ORDER — BUDESONIDE-FORMOTEROL FUMARATE 160-4.5 MCG/ACT IN AERO: 2.0000 | INHALATION_SPRAY | Freq: Two times a day (BID) | RESPIRATORY_TRACT | Status: DC

## 2011-10-05 NOTE — Progress Notes (Signed)
Subjective:    Patient ID: [name] [name], female    DOB: 09/20/1981, 30 y.o.   MRN: [id number]  HPI 30 year old female, Obese, Non smoker. > 20# weight gain since 2005. Mulitorrgan lupous (who class 2 nephritis, joints, raynaud - on cellcept, prednisone, plaquenil - ongoing active lupus 05/2010). NICM ef 25% in 2007 with normal left heart cath with resolution to normal ef by oct 2010. Presented August 2011 with doe x few years. CT chest sept 2011 - normal parenchyma. Pulmnary hypertension - rt heart cath 07/16/2010 - group 1 and 2 pulmonary hypertension (PA mean 38, PCWP 18, TPG 20, PVR 3.6 wood units, 480 m on 1mwd, lowest pulse ox 93%). Started letairis Jul 28, 2010 with 9mwd of 438m at commencement. Increase letairis to 10mg  daily 09/26/2010  OV 04/14/2011. Still dyspneic despite letairis intiation and increase in dose. Qualifies dyspnea as variable; sometimes at work with exertion for short distances but other times able to walk 1-2 miles without problems. Hot climate makes it worse.  Has associated chest tightness/pain. Dyspnea relieved by rest. Feels letairis has not helped at all. Weight has been variable; comparing prior weight appears stable. BMI is 37.6 - a year ago it was 37. REC CPST Continue letairis for now  OV 10/05/2011 follow up dyspnea and pulmonary hypertension in patient with SLE.   She was lost to followup. After many attempts at reaching her she finally showed up today. She had cardiopulmonary stress test in 04/24/2011. This showed multi-factorial causes for dyspnea including obesity, possible diastolic dysfunction and some element of pulmonary hypertension. Clearly the test did not delineate circlutary limitation from pulmonary hypertension as the main etiology for dyspnea. The test was done while she was on letairis. She has now run out of it  one month ago.. She says that letairis did not make any difference in her dyspnea either when she was on it or now off of it. She's  again vague with her this history. Dyspnea appears randomly. It appears it is episodic and sometimes exertional. She gives history that prednisone burst helped resolve dyspnea. Denies any triggers like dust perfumes but humid air made it worse. No  cough or wheezing.  CPST 04/25/11 (while on letairis)   The RER of 1.06 indicates a near maximal aerobic effort. The peak VO2 is mildly reduced at 13.8 ml/kg/min (68% of the age/gender/weight matched sedentary norm). When adjusted to the patient's ideal body weight of 149.5 lb (67.8 kg) the peak VO2 is 22 ml/kg (ibw)/min (77% of the IBW adjusted predicted peak VO2). This correction towards normal suggests obesity might be playing a role in dyspnea. The VO2 at the ventilatory threshold is below normal at only 39% of the predicted peak VO2. At peak exercise the ventilation reached 79% of the measured MVV indicating ventilatory limits were being approached. The respiratory rate at peak exercise was quite excessive reaching 66 bpm, and is consistent with the restrictive findings in her spirometry (Vt = 1.04 L; Vt/IC = 63%). The PETCO2 also reflects her elevated respiratory rate remaining at or below 30 mm Hg throughout rest and exercise. Though the HR was adequate in terms of evaluating ECG changes and to determine the chronotropic effect of her beta-blockade, the HR reserve is still mildly elevated at 23 bpm. The O2pulse (a surrogate for stroke volume) increased slowly throughout the exercise reaching 9 ml/beat (75% of predicted). The VE/VCO2 slope is grossly elevated and reflects excessive dead space ventilation and pronounced ventilatory inefficiency. The oxygen  uptake efficiency slope (OUES) is mildly reduced and reflects her exercise capacity.  Conclusion Exercise testing with gas exchange demonstrates a mild functional limitation when compared to matched sedentary norms, but is improved when adjusted to ideal body weight. These results suggests the  patient's functional limitation and exercise intolerance is multifactoral (obesity, restrictve ventilatory limitation and pulmonary hypertension). Mixed picture   Past, Family, Social reviewed: no change since last visit though has gained weight. Body mass index is 38.56 kg/(m^2). which is 1 point up.  She just under weight watchers program one week ago. In terms of lupus Rx: cellcept changed to immuran but prednisone and plaquenil are ongoing.     Review of Systems  Constitutional: Positive for fever. Negative for unexpected weight change.  HENT: Positive for rhinorrhea and postnasal drip. Negative for ear pain, nosebleeds, congestion, sore throat, sneezing, trouble swallowing, dental problem and sinus pressure.   Eyes: Negative for redness and itching.  Respiratory: Positive for chest tightness and shortness of breath. Negative for cough and wheezing.   Cardiovascular: Positive for palpitations and leg swelling.  Gastrointestinal: Negative for nausea, vomiting and diarrhea.  Genitourinary: Negative for dysuria.  Musculoskeletal: Positive for joint swelling.  Skin: Negative for rash.  Neurological: Positive for headaches.  Hematological: Bruises/bleeds easily.  Psychiatric/Behavioral: Negative for dysphoric mood. The patient is not nervous/anxious.        Objective:   Physical Exam  Physical Exam  Vitals reviewed. Constitutional: She is oriented to person, place, and time. She appears well-developed and well-nourished. No distress.       obese  HENT:  Head: Normocephalic and atraumatic.  Right Ear: External ear normal.  Left Ear: External ear normal.  Mouth/Throat: Oropharynx is clear and moist. No oropharyngeal exudate.  Eyes: Conjunctivae and EOM are normal. Pupils are equal, round, and reactive to light. Right eye exhibits no discharge. Left eye exhibits no discharge. No scleral icterus.  Neck: Normal range of motion. Neck supple. No JVD present. No tracheal deviation  present. No thyromegaly present.  Cardiovascular: Normal rate, regular rhythm, normal heart sounds and intact distal pulses.  Exam reveals no gallop and no friction rub.   No murmur heard.      Loud P2  Pulmonary/Chest: Effort normal and breath sounds normal. No respiratory distress. She has no wheezes. She has no rales. She exhibits no tenderness.  Abdominal: Soft. Bowel sounds are normal. She exhibits no distension and no mass. There is no tenderness. There is no rebound and no guarding.  Musculoskeletal: Normal range of motion. She exhibits no edema and no tenderness.  Lymphadenopathy:    She has no cervical adenopathy.  Neurological: She is alert and oriented to person, place, and time. She has normal reflexes. No cranial nerve deficit. She exhibits normal muscle tone. Coordination normal.  Skin: Skin is warm and dry. No rash noted. She is not diaphoretic. No erythema. No pallor.       Dermal lupus - dry skin  Psychiatric: She has a normal mood and affect. Her behavior is normal. Judgment and thought content normal.         Assessment & Plan:

## 2011-10-05 NOTE — Patient Instructions (Addendum)
I think the multiple reasons for shortness of breath It is possible that he might have asthma. Therefore, please try symbicortr 2 puff twice daily 160/4.5dose - daily wihtout fail To improve physical fitness please joint pulmonary rehabilitation at [facility]-we will make a referral Please continue with Weight Watchers program and efforts at weight loss I will see her in 3 months and reassess We will hold off on restarting  letairis for now  did you have flu shot? If not, have one today

## 2011-10-10 NOTE — Assessment & Plan Note (Addendum)
I think there are multiple reasons for shortness of breath including weight, and pulmonary hypertension Subjectively letairis for pulmonary hypertension has not helped. No change while on it or off it It is possible that she might have asthma based on history and CPST showing dead space ventilation. Therefore, please try symbicortr 2 puff twice daily 160/4.5dose - daily wihtout fail To improve physical fitness please joint pulmonary rehabilitation at [facility]-we will make a referral Please continue with Weight Watchers program and efforts at weight loss I will see her in 3 months and reassess We will hold off on restarting  letairis for now  did you have flu shot? If not, have one today

## 2011-11-07 ENCOUNTER — Encounter (HOSPITAL_COMMUNITY): Payer: 59

## 2011-11-07 ENCOUNTER — Encounter (HOSPITAL_COMMUNITY): Payer: Self-pay

## 2011-11-07 ENCOUNTER — Encounter (HOSPITAL_COMMUNITY)
Admission: RE | Admit: 2011-11-07 | Discharge: 2011-11-07 | Disposition: A | Discharge status: Home / Self Care | Payer: 59 | Source: Ambulatory Visit | Attending: Internal Medicine | Admitting: Internal Medicine

## 2011-11-07 DIAGNOSIS — M329 Systemic lupus erythematosus, unspecified: Secondary | ICD-10-CM | POA: Insufficient documentation

## 2011-11-07 DIAGNOSIS — I10 Essential (primary) hypertension: Secondary | ICD-10-CM | POA: Insufficient documentation

## 2011-11-07 DIAGNOSIS — Z5189 Encounter for other specified aftercare: Secondary | ICD-10-CM | POA: Insufficient documentation

## 2011-11-07 DIAGNOSIS — Z8673 Personal history of transient ischemic attack (TIA), and cerebral infarction without residual deficits: Secondary | ICD-10-CM | POA: Insufficient documentation

## 2011-11-07 DIAGNOSIS — N059 Unspecified nephritic syndrome with unspecified morphologic changes: Secondary | ICD-10-CM | POA: Insufficient documentation

## 2011-11-07 DIAGNOSIS — I429 Cardiomyopathy, unspecified: Secondary | ICD-10-CM | POA: Insufficient documentation

## 2011-11-07 DIAGNOSIS — I2789 Other specified pulmonary heart diseases: Secondary | ICD-10-CM | POA: Insufficient documentation

## 2011-11-07 DIAGNOSIS — I509 Heart failure, unspecified: Secondary | ICD-10-CM | POA: Insufficient documentation

## 2011-11-07 HISTORY — DX: Chronic kidney disease, unspecified: N18.9

## 2011-11-07 HISTORY — DX: Heart failure, unspecified: I50.9

## 2011-11-07 NOTE — Progress Notes (Signed)
[name] [name] came today for Pulmonary Rehab orientation.  Her resting heart rate prior to walk test was 106.  Reviewed with Jessee Avers, Exercise Specialist and she felt this might be too high after calculating her target heart rate.  Dr Chase Caller was notified and after observing, we did have a resting rate of 98 and proceeded with walk test.  After 2 minutes her heart rate was 143 and at 6 min she reached 150.  After 1 min 30 seconds rest her rate returned to 100.  She did not feel excessive shortness of breath.  She did not weigh today and was unsure if she might have more fluid accumulation.  She has not been following low sodium diet.  We completed the orientation and she plans to start exercise on 11-09-11. Demonstration and practice of PLB technique with patient and review of safety and hand hygiene in the exercise area.

## 2011-11-09 ENCOUNTER — Encounter (HOSPITAL_COMMUNITY)
Admission: RE | Admit: 2011-11-09 | Discharge: 2011-11-09 | Disposition: A | Discharge status: Home / Self Care | Payer: 59 | Source: Ambulatory Visit | Attending: Internal Medicine | Admitting: Internal Medicine

## 2011-11-09 NOTE — Progress Notes (Signed)
First day of exercise in Pulmonary Rehab.  She was oriented to equipment use, safety, RPE and Dyspnea Scale.  Demonstration and Practice of PLB technique.  She did have elevated BPs today , Resting 152/84 at check -in, 138/108 at discharge and exercise high of 154 /104.  Discussed with patient and she has not taken her BP meds today.  She was out, going to drug store after class to pick up.  We will continue to monitor closely.  Tolerated well, HR range 112 resting to 128 with exercise.

## 2011-11-13 ENCOUNTER — Telehealth: Payer: Self-pay | Admitting: Internal Medicine

## 2011-11-13 NOTE — Telephone Encounter (Signed)
Hi Tom  I took [place] off letairis for pulm htn due to not subjective improvement and CPST showed multifactorial dysopnea. I had her join pulm rehab  But they report significant tachycardia even with minimal exertion. Wondering if you could see her in your office and address the issue  Thanks  MR

## 2011-11-14 ENCOUNTER — Encounter (HOSPITAL_COMMUNITY)
Admission: RE | Admit: 2011-11-14 | Discharge: 2011-11-14 | Disposition: A | Discharge status: Home / Self Care | Payer: 59 | Source: Ambulatory Visit | Attending: Internal Medicine | Admitting: Internal Medicine

## 2011-11-16 ENCOUNTER — Encounter (HOSPITAL_COMMUNITY): Payer: 59

## 2011-11-21 ENCOUNTER — Encounter (HOSPITAL_COMMUNITY)
Admission: RE | Admit: 2011-11-21 | Discharge: 2011-11-21 | Disposition: A | Discharge status: Home / Self Care | Payer: 59 | Source: Ambulatory Visit | Attending: Internal Medicine | Admitting: Internal Medicine

## 2011-11-21 NOTE — Progress Notes (Signed)
Pulmonary Rehab Nutrition Screen  [name] [name] 31 y.o. female             Ht:67" Ht Readings from Last 1 Encounters:  10/05/11 5\' 7"  (1.702 m)    Wt:  242.2 lb (110.1 kg) Wt Readings from Last 3 Encounters:  10/05/11 246 lb 3.2 oz (111.676 kg)  04/13/11 240 lb 3.2 oz (108.954 kg)  10/08/10 236 lb (107.049 kg)    BMI 38.0  %body fat 44.8                                    Rate Your Plate Score: 40  Please answer the following questions:             YES  NO    Do you live in a nursing home?  X   Do you eat out more than 3 times per week?   X  If yes, how many times per week do you eat out? 5-8 because pt gets "tired of cooking."  Do you have food allergies?   X If yes, what are you allergic to?  Have you gained or lost more than 10 lbs without trying?               X If yes, how much weight have you  lost or gained?  lbs over  weeks/mo   Do you want to lose weight?    X  If yes, what is a goal weight or amount of weight you would like to lose? 40 #  Do you eat alone most of the time?   X   Do you eat less than 2 meals/day?  X If yes, how many meals do you eat?  Do you use canned and convenience food? X    Do you use a salt shaker?  X   Do you drink more than 3 alcoholic drinks/day?  X If yes, how many drinks per day?  Are you having trouble with constipation? *  X If yes, what are you doing to help relieve constipation?  Do you have financial difficulties with buying food? *  X   Do you usually need help with grocery shopping or with cooking? *  X   Do you have a poor appetite? *                                       X   Do you have trouble chewing/ swallowing? *   X   Do you take vitamin and mineral or herbal supplements? *  X If yes, what kind of supplements do you currently take?      Nutrition Assessment [name] [name]  Time Spent: 39 min   31 y.o. female with [place] Past Medical History  Diagnosis Date  . Unspecified essential hypertension   . Unspecified  deficiency anemia   . Lupus erythematosus   . Systemic lupus erythematosus   . Secondary cardiomyopathy, unspecified   . ERYTHEMATOSUS, LUPUS 08/07/2006  . CHF (congestive heart failure) 5-6 yrs ago  hosp  . Chronic kidney disease     sees specialist [place]  f/u lupus   Current tobacco use? No  Meds: Prednisone Labs:  No results found for this basename: HGBA1C   HT 67" Ht Readings from Last 1 Encounters:  10/05/11  5\' 7"  (1.702 m)  WT   242.2 lb (110.1 kg) Wt Readings from Last 3 Encounters:  10/05/11 246 lb 3.2 oz (111.676 kg)  04/13/11 240 lb 3.2 oz (108.954 kg)  10/08/10 236 lb (107.049 kg)  IBW 61.6 kg   179%IBW BMI 38.0   %body fat 44.8 Assessment: Pt is obese.  Per MD note, pt started on Weight Watchers.  Pt wt is down 3.8 lb over the past month, wt loss desired and appears safe.  Pt reports she is eating out 5-8 times a week because she "gets tired of cooking." Pt states she is trying to eat more [employer] type sandwiches when she eats out,  There are some ways the pt can make her eating habits healthier for her lungs.  Pt consuming 1 serving of fruits/veggies daily.  Pt encouraged to increase fruit/veggie consumption.  Pt expressed understanding.  Pt does not avoid salty food; uses canned/ convenience food.  Pt does not add salt to food.  The role of sodium in lung disease reviewed with pt.   Nutrition Diagnosis   Excessive sodium intake related to over consumption of processed food as evidenced by frequent consumption of convenience food/ canned vegetables and eating out frequently.   Food-and nutrition-related knowledge deficit related to lack of exposure to information as related to diagnosis of pulmonary disease   Obesity related to excessive energy intake as evidenced by a BMI of 38.0 Nutrition Rx/Est. Daily Nutrition Needs for: ? wt loss 1600-2100 Kcal  90-110 gm protein   1500 mg or less sodium      Nutrition Intervention   Pt's individual nutrition plan and goals reviewed with  pt.   Benefits of adopting healthy eating habits discussed when pt's Rate Your Plate reviewed.   Pt to consider adding Calcium supplement due to prednisone use.   Handouts given for Eating Out Heart Healthy and the American Heart Associations list of Heart-check Mark products   Pt to attend the Nutrition and Lung Disease class   Continual client-centered nutrition education by RD, as part of interdisciplinary care. Goal(s) 1. Pt to identify and limit food sources of sodium. 2. Identify food quantities necessary to achieve wt loss of  -2# per week to a goal wt of 99.1-107.3 kg (218-236 #) at graduation from pulmonary rehab. 3. Describe the benefit of including fruits, vegetables, whole grains, and low-fat dairy products in a healthy meal plan. Monitor and Evaluate progress toward nutrition goal with team.

## 2011-11-22 NOTE — Telephone Encounter (Signed)
Please arrange a visit.

## 2011-11-22 NOTE — Telephone Encounter (Signed)
Pt has appt for 11/23/11 Horton Chin RN

## 2011-11-22 NOTE — Telephone Encounter (Signed)
Yes we'll do urine

## 2011-11-23 ENCOUNTER — Encounter (HOSPITAL_COMMUNITY)
Admission: RE | Admit: 2011-11-23 | Discharge: 2011-11-23 | Disposition: A | Discharge status: Home / Self Care | Payer: 59 | Source: Ambulatory Visit | Attending: Internal Medicine | Admitting: Internal Medicine

## 2011-11-23 ENCOUNTER — Encounter: Payer: Self-pay | Admitting: Cardiology

## 2011-11-23 ENCOUNTER — Ambulatory Visit (INDEPENDENT_AMBULATORY_CARE_PROVIDER_SITE_OTHER): Payer: 59 | Admitting: Cardiology

## 2011-11-23 VITALS — BP 128/80 | HR 87 | Ht 67.0 in | Wt 243.0 lb

## 2011-11-23 DIAGNOSIS — I429 Cardiomyopathy, unspecified: Secondary | ICD-10-CM

## 2011-11-23 DIAGNOSIS — R000 Tachycardia, unspecified: Secondary | ICD-10-CM

## 2011-11-23 DIAGNOSIS — I10 Essential (primary) hypertension: Secondary | ICD-10-CM

## 2011-11-23 DIAGNOSIS — I2789 Other specified pulmonary heart diseases: Secondary | ICD-10-CM

## 2011-11-23 MED ORDER — METOPROLOL TARTRATE 100 MG PO TABS: 100.0000 mg | ORAL_TABLET | Freq: Two times a day (BID) | ORAL | Status: DC

## 2011-11-23 NOTE — Patient Instructions (Addendum)
Your physician has recommended you make the following change in your medication:  Stop Carvedilol   Start Metoprolol  Your physician recommends that you  follow-up appointment in 4 weeks with Dr. Verl Blalock.  Your physician discussed the importance of regular exercise and recommended that you start or continue a regular exercise program for good health.  Maximum heart rate while exercising should be 120 or less.

## 2011-11-23 NOTE — Assessment & Plan Note (Addendum)
This is probably multifactorial including deconditioning, weight, pulmonary hypertension.  I will change her from carvedilol to metoprolol 100mg  p.o. B.i.d. I'll see her back in 4 weeks. I think this will improve if she gets more conditioned and titrate her beta blocker further. Hopefully, this will make her feel better and have more endurance.

## 2011-11-23 NOTE — Assessment & Plan Note (Signed)
Well-controlled. We'll monitor with change to metoprolol.

## 2011-11-23 NOTE — Progress Notes (Signed)
HPI  [name] [name] returns today for assessment of her exaggerated heart rate with exercise at pulmonary rehabilitation.  She has a history of a nonischemic cardiomyopathy with resolution of her LV systolic dysfunction. Her last ejection fraction was 55-60%. She has lupus. She also has pulmonary hypertension diagnosed by catheter in 2011. A level III stress test showed multifactorial dyspnea on exertion shortness of breath. Weight is a major issue.  She's also had poorly controlled hypertension the past. He  In pulmonary rehabilitation, which she just began, she is been noted to have heart rates of 150 with minimal exercise. She does get short of breath but denies chest pain. She does the heart pounding. She denies any presyncope or syncope.  She's been on carvedilol for a number of years. Her blood pressures done under good control. Her weight is stable. Her edema is stable. There she denies orthopnea, PND.  Past Medical History  Diagnosis Date  . Unspecified essential hypertension   . Unspecified deficiency anemia   . Lupus erythematosus   . Systemic lupus erythematosus   . Secondary cardiomyopathy, unspecified   . ERYTHEMATOSUS, LUPUS 08/07/2006  . CHF (congestive heart failure) 5-6 yrs ago  hosp  . Chronic kidney disease     sees specialist [place]  f/u lupus    Current Outpatient Prescriptions  Medication Sig Dispense Refill  . ambrisentan (LETAIRIS) 10 MG tablet Take 10 mg by mouth daily.        Marland Kitchen amLODipine (NORVASC) 5 MG tablet Take 5 mg by mouth daily.        Marland Kitchen azaTHIOprine (IMURAN) 50 MG tablet Take 100 mg by mouth daily.       . budesonide-formoterol (SYMBICORT) 160-4.5 MCG/ACT inhaler Inhale 2 puffs into the lungs 2 (two) times daily.  1 Inhaler  3  . carvedilol (COREG) 25 MG tablet Take 25 mg by mouth 2 (two) times daily.        . furosemide (LASIX) 40 MG tablet Take 40 mg by mouth daily.        . hydroxychloroquine (PLAQUENIL) 200 MG tablet TAKE 1 TABLET BY MOUTH TWICE  DAILY  120 tablet  1  . lisinopril (PRINIVIL,ZESTRIL) 20 MG tablet Take 20 mg by mouth 2 (two) times daily.        . predniSONE (DELTASONE) 10 MG tablet Take 10 mg by mouth daily.        Marland Kitchen spironolactone (ALDACTONE) 25 MG tablet Take 25 mg by mouth daily.        Marland Kitchen HYDROcodone-acetaminophen (VICODIN) 5-500 MG per tablet Take 1 tablet by mouth every 6 (six) hours as needed.          No Known Allergies  Family History  Problem Relation Age of Onset  . Lupus Mother   . Arthritis Maternal Grandmother     History   Social History  . Marital Status: Single    Spouse Name: N/A    Number of Children: N/A  . Years of Education: N/A   Occupational History  . retail     [employer]   Social History Main Topics  . Smoking status: Never Smoker   . Smokeless tobacco: Never Used  . Alcohol Use: Yes     socially  . Drug Use: No  . Sexually Active: No   Other Topics Concern  . Not on file   Social History Narrative  . No narrative on file    ROS ALL NEGATIVE EXCEPT THOSE NOTED IN HPI  PE  General  Appearance: well developed, well nourished in no acute distress, obese HEENT: symmetrical face, PERRLA, good dentition  Neck: no JVD, thyromegaly, or adenopathy, trachea midline Chest: symmetric without deformity Cardiac: PMI non-displaced, RRR, normal S1, S2, no gallop or murmur Lung: clear to ausculation and percussion Vascular: all pulses full without bruits  Abdominal: nondistended, nontender, good bowel sounds, no HSM, no bruits Extremities: no cyanosis, clubbing , 1+ pitting edema bilaterally no sign of DVT, no varicosities  Skin: normal color, no rashes Neuro: alert and oriented x 3, non-focal Pysch: normal affect  EKG Normal sinus rhythm, rate 87 with first-degree AV block with left atrial enlargement BMET    Component Value Date/Time   NA 140 08/08/2010 0000   K 4.6 08/08/2010 0000   CL 107 08/08/2010 0000   CO2 26 08/08/2010 0000   GLUCOSE 92 08/08/2010 0000   BUN 28*  08/08/2010 0000   CREATININE 1.2 08/08/2010 0000   CALCIUM 9.5 08/08/2010 0000   GFRNONAA 68.13 08/08/2010 0000   GFRAA 110 10/02/2008 1240    Lipid Panel  No results found for this basename: chol, trig, hdl, cholhdl, vldl, ldlcalc    CBC    Component Value Date/Time   WBC 3.3* 07/12/2010 1022   RBC 3.95 07/12/2010 1022   HGB 11.1* 07/12/2010 1022   HCT 33.3* 07/12/2010 1022   PLT 211.0 07/12/2010 1022   MCV 84.3 07/12/2010 1022   MCHC 33.3 07/12/2010 1022   RDW 14.1 07/12/2010 1022   LYMPHSABS 0.7 07/12/2010 1022   MONOABS 0.3 07/12/2010 1022   EOSABS 0.3 07/12/2010 1022   BASOSABS 0.0 07/12/2010 1022

## 2011-11-28 ENCOUNTER — Encounter (HOSPITAL_COMMUNITY)
Admission: RE | Admit: 2011-11-28 | Discharge: 2011-11-28 | Disposition: A | Discharge status: Home / Self Care | Payer: 59 | Source: Ambulatory Visit | Attending: Internal Medicine | Admitting: Internal Medicine

## 2011-11-28 DIAGNOSIS — I509 Heart failure, unspecified: Secondary | ICD-10-CM | POA: Insufficient documentation

## 2011-11-28 DIAGNOSIS — M329 Systemic lupus erythematosus, unspecified: Secondary | ICD-10-CM | POA: Insufficient documentation

## 2011-11-28 DIAGNOSIS — I2789 Other specified pulmonary heart diseases: Secondary | ICD-10-CM | POA: Insufficient documentation

## 2011-11-28 DIAGNOSIS — Z5189 Encounter for other specified aftercare: Secondary | ICD-10-CM | POA: Insufficient documentation

## 2011-11-28 DIAGNOSIS — I429 Cardiomyopathy, unspecified: Secondary | ICD-10-CM | POA: Insufficient documentation

## 2011-11-28 DIAGNOSIS — N059 Unspecified nephritic syndrome with unspecified morphologic changes: Secondary | ICD-10-CM | POA: Insufficient documentation

## 2011-11-28 DIAGNOSIS — Z8673 Personal history of transient ischemic attack (TIA), and cerebral infarction without residual deficits: Secondary | ICD-10-CM | POA: Insufficient documentation

## 2011-11-28 DIAGNOSIS — I10 Essential (primary) hypertension: Secondary | ICD-10-CM | POA: Insufficient documentation

## 2011-11-30 ENCOUNTER — Encounter (HOSPITAL_COMMUNITY)
Admission: RE | Admit: 2011-11-30 | Discharge: 2011-11-30 | Disposition: A | Discharge status: Home / Self Care | Payer: 59 | Source: Ambulatory Visit | Attending: Internal Medicine | Admitting: Internal Medicine

## 2011-11-30 NOTE — Progress Notes (Signed)
Completed home exercise with patient. Reviewed exercise progression, routine, exercising at a comfortable pace, RPE/Dyspnea scales, how important it is to own a pulse oximeter and how to use one, weather conditions, warning signs and symptoms with exercise, and CP/NTG. We discussed when to call MD. Patient voices understanding. Patient has a goal to learn a stable and safe exercise routine for an everyday basis.. Will continue to encourage and support.

## 2011-11-30 NOTE — Progress Notes (Signed)
Spoke with pt.  Pt given a handout on Calcium content of different foods.  Goal of 1500 mg of calcium daily while on prednisone discussed.  Pt reports she was taking calcium supplements and her MD took her off of them.  Pt to consider Calcium supplements given prednisone usage if she can't meet the calcium goal with diet alone.  Pt encouraged to discuss Calcium supplementation with MD. Continue client-centered nutrition education by RD as part of interdisciplinary care.  Monitor and evaluate progress toward nutrition goal with team.

## 2011-12-05 ENCOUNTER — Other Ambulatory Visit (HOSPITAL_COMMUNITY): Payer: Self-pay | Admitting: *Deleted

## 2011-12-05 ENCOUNTER — Encounter (HOSPITAL_COMMUNITY)
Admission: RE | Admit: 2011-12-05 | Discharge: 2011-12-05 | Disposition: A | Discharge status: Home / Self Care | Payer: 59 | Source: Ambulatory Visit | Attending: Internal Medicine | Admitting: Internal Medicine

## 2011-12-07 ENCOUNTER — Encounter (HOSPITAL_COMMUNITY): Payer: 59

## 2011-12-07 ENCOUNTER — Encounter (HOSPITAL_COMMUNITY)
Admission: RE | Admit: 2011-12-07 | Discharge: 2011-12-07 | Disposition: A | Discharge status: Home / Self Care | Payer: 59 | Source: Ambulatory Visit | Attending: Internal Medicine | Admitting: Internal Medicine

## 2011-12-12 ENCOUNTER — Encounter (HOSPITAL_COMMUNITY)
Admission: RE | Admit: 2011-12-12 | Discharge: 2011-12-12 | Disposition: A | Discharge status: Home / Self Care | Payer: 59 | Source: Ambulatory Visit | Attending: Internal Medicine | Admitting: Internal Medicine

## 2011-12-12 ENCOUNTER — Encounter (HOSPITAL_COMMUNITY): Payer: 59

## 2011-12-14 ENCOUNTER — Encounter (HOSPITAL_COMMUNITY)
Admission: RE | Admit: 2011-12-14 | Discharge: 2011-12-14 | Disposition: A | Discharge status: Home / Self Care | Payer: 59 | Source: Ambulatory Visit | Attending: Internal Medicine | Admitting: Internal Medicine

## 2011-12-19 ENCOUNTER — Encounter (HOSPITAL_COMMUNITY)
Admission: RE | Admit: 2011-12-19 | Discharge: 2011-12-19 | Disposition: A | Discharge status: Home / Self Care | Payer: 59 | Source: Ambulatory Visit | Attending: Internal Medicine | Admitting: Internal Medicine

## 2011-12-19 NOTE — Progress Notes (Signed)
Patient came to Pulmonary Rehab today.  While walking on treadmill, first exercise station , she informed staff she was having chest pain with the activity.  She rated her chest pain 4/10.  Exercise stopped, she was placed on Zoll monitor , sinus rhythm, rate 85.  Oxygen saturation 98% on room air.  Chest discomfort resolved with rest. Patient states she feels this is related to a lupus flair which she has had in the past.  Last episode was several months ago. Dr Dion Saucier office called and notified of this event. Ms. Sarah says she has been having these symptoms for the past 3 days with exertion.  She recently joined a gym for additional exercise.  She was pain free at exit, an appointment was made to see Dr. Rosann Auerbach at 1:30 pm today. Exit BP was 118/98.

## 2011-12-21 ENCOUNTER — Encounter (HOSPITAL_COMMUNITY): Payer: 59

## 2011-12-26 ENCOUNTER — Encounter (HOSPITAL_COMMUNITY)
Admission: RE | Admit: 2011-12-26 | Discharge: 2011-12-26 | Disposition: A | Discharge status: Home / Self Care | Payer: 59 | Source: Ambulatory Visit | Attending: Internal Medicine | Admitting: Internal Medicine

## 2011-12-26 DIAGNOSIS — N059 Unspecified nephritic syndrome with unspecified morphologic changes: Secondary | ICD-10-CM | POA: Insufficient documentation

## 2011-12-26 DIAGNOSIS — Z5189 Encounter for other specified aftercare: Secondary | ICD-10-CM | POA: Insufficient documentation

## 2011-12-26 DIAGNOSIS — I2789 Other specified pulmonary heart diseases: Secondary | ICD-10-CM | POA: Insufficient documentation

## 2011-12-26 DIAGNOSIS — M329 Systemic lupus erythematosus, unspecified: Secondary | ICD-10-CM | POA: Insufficient documentation

## 2011-12-26 DIAGNOSIS — I10 Essential (primary) hypertension: Secondary | ICD-10-CM | POA: Insufficient documentation

## 2011-12-26 DIAGNOSIS — I429 Cardiomyopathy, unspecified: Secondary | ICD-10-CM | POA: Insufficient documentation

## 2011-12-26 DIAGNOSIS — I509 Heart failure, unspecified: Secondary | ICD-10-CM | POA: Insufficient documentation

## 2011-12-26 DIAGNOSIS — Z8673 Personal history of transient ischemic attack (TIA), and cerebral infarction without residual deficits: Secondary | ICD-10-CM | POA: Insufficient documentation

## 2011-12-27 ENCOUNTER — Encounter: Payer: Self-pay | Admitting: Cardiology

## 2011-12-27 ENCOUNTER — Ambulatory Visit (INDEPENDENT_AMBULATORY_CARE_PROVIDER_SITE_OTHER): Payer: 59 | Admitting: Cardiology

## 2011-12-27 VITALS — BP 162/100 | HR 64 | Ht 67.0 in | Wt 242.0 lb

## 2011-12-27 DIAGNOSIS — I10 Essential (primary) hypertension: Secondary | ICD-10-CM

## 2011-12-27 DIAGNOSIS — I429 Cardiomyopathy, unspecified: Secondary | ICD-10-CM

## 2011-12-27 DIAGNOSIS — R000 Tachycardia, unspecified: Secondary | ICD-10-CM

## 2011-12-27 DIAGNOSIS — L930 Discoid lupus erythematosus: Secondary | ICD-10-CM

## 2011-12-27 DIAGNOSIS — I2789 Other specified pulmonary heart diseases: Secondary | ICD-10-CM

## 2011-12-27 MED ORDER — LISINOPRIL 40 MG PO TABS: 40.0000 mg | ORAL_TABLET | Freq: Every day | ORAL | Status: DC

## 2011-12-27 MED ORDER — SPIRONOLACTONE 25 MG PO TABS: 25.0000 mg | ORAL_TABLET | Freq: Every day | ORAL | Status: DC

## 2011-12-27 NOTE — Assessment & Plan Note (Signed)
Remarkably improved with the switch to metoprolol. Her blood pressure has increased since coming off carvedilol. She also ran out of spironolactone.

## 2011-12-27 NOTE — Progress Notes (Signed)
HPI [name] [name] returns for close follow of  sinus tachycardia. I switched her from carvedilol to metoprolol last visit.  Heart rate is now running in the 60s. She denies any palpitations or shortness of breath. Her blood pressure at pulmonary rehabilitation however has been elevated on occasion particularly diastolic readings. She does admit to running out of spironolactone several days ago.  She is much better with avoiding fast food andsalt. She cooks at home most of time.  Past Medical History  Diagnosis Date  . Unspecified essential hypertension   . Unspecified deficiency anemia   . Lupus erythematosus   . Systemic lupus erythematosus   . Secondary cardiomyopathy, unspecified   . ERYTHEMATOSUS, LUPUS 08/07/2006  . CHF (congestive heart failure) 5-6 yrs ago  hosp  . Chronic kidney disease     sees specialist [place]  f/u lupus    Current Outpatient Prescriptions  Medication Sig Dispense Refill  . ambrisentan (LETAIRIS) 10 MG tablet Take 10 mg by mouth daily.        Marland Kitchen amLODipine (NORVASC) 5 MG tablet Take 5 mg by mouth daily.        Marland Kitchen azaTHIOprine (IMURAN) 50 MG tablet Take 100 mg by mouth daily.       . budesonide-formoterol (SYMBICORT) 160-4.5 MCG/ACT inhaler Inhale 2 puffs into the lungs 2 (two) times daily.  1 Inhaler  3  . furosemide (LASIX) 40 MG tablet Take 40 mg by mouth daily.        Marland Kitchen HYDROcodone-acetaminophen (VICODIN) 5-500 MG per tablet Take 1 tablet by mouth every 6 (six) hours as needed.        . hydroxychloroquine (PLAQUENIL) 200 MG tablet TAKE 1 TABLET BY MOUTH TWICE DAILY  120 tablet  1  . lisinopril (PRINIVIL,ZESTRIL) 40 MG tablet Take 1 tablet (40 mg total) by mouth daily.  90 tablet  3  . metoprolol (LOPRESSOR) 100 MG tablet Take 1 tablet (100 mg total) by mouth 2 (two) times daily.  60 tablet  11  . predniSONE (DELTASONE) 10 MG tablet Take 10 mg by mouth daily.        Marland Kitchen spironolactone (ALDACTONE) 25 MG tablet Take 1 tablet (25 mg total) by mouth daily.  90 tablet   3    No Known Allergies  Family History  Problem Relation Age of Onset  . Lupus Mother   . Arthritis Maternal Grandmother     History   Social History  . Marital Status: Single    Spouse Name: N/A    Number of Children: N/A  . Years of Education: N/A   Occupational History  . retail     [employer]   Social History Main Topics  . Smoking status: Never Smoker   . Smokeless tobacco: Never Used  . Alcohol Use: Yes     socially  . Drug Use: No  . Sexually Active: No   Other Topics Concern  . Not on file   Social History Narrative  . No narrative on file    ROS ALL NEGATIVE EXCEPT THOSE NOTED IN HPI  PE  General Appearance: well developed, well nourished in no acute distress, obese HEENT: symmetrical face, PERRLA, good dentition  Neck: no JVD, thyromegaly, or adenopathy, trachea midline Chest: symmetric without deformity Cardiac: PMI non-displaced, RRR, normal S1, S2, no gallop or murmur Lung: clear to ausculation and percussion Vascular: all pulses full without bruits  Abdominal: nondistended, nontender, good bowel sounds, no HSM, no bruits Extremities: no cyanosis, clubbing,1-2+ pitting edema no  sign of DVT, no varicosities  Skin: normal color, no rashes Neuro: alert and oriented x 3, non-focal Pysch: normal affect  EKG Normal sinus rhythm, rate 64 beats per minute. BMET    Component Value Date/Time   NA 140 08/08/2010 0000   K 4.6 08/08/2010 0000   CL 107 08/08/2010 0000   CO2 26 08/08/2010 0000   GLUCOSE 92 08/08/2010 0000   BUN 28* 08/08/2010 0000   CREATININE 1.2 08/08/2010 0000   CALCIUM 9.5 08/08/2010 0000   GFRNONAA 68.13 08/08/2010 0000   GFRAA 110 10/02/2008 1240    Lipid Panel  No results found for this basename: chol, trig, hdl, cholhdl, vldl, ldlcalc    CBC    Component Value Date/Time   WBC 3.3* 07/12/2010 1022   RBC 3.95 07/12/2010 1022   HGB 11.1* 07/12/2010 1022   HCT 33.3* 07/12/2010 1022   PLT 211.0 07/12/2010 1022   MCV 84.3  07/12/2010 1022   MCHC 33.3 07/12/2010 1022   RDW 14.1 07/12/2010 1022   LYMPHSABS 0.7 07/12/2010 1022   MONOABS 0.3 07/12/2010 1022   EOSABS 0.3 07/12/2010 1022   BASOSABS 0.0 07/12/2010 1022

## 2011-12-27 NOTE — Patient Instructions (Signed)
Your physician has recommended you make the following change in your medication:  Increase Lisinopril to 40mg  daily  Your physician recommends that you schedule a follow-up appointment in: 4-6 weeks with Dr. Verl Blalock  Have your blood pressure checked at pulmonary rehab.

## 2011-12-27 NOTE — Assessment & Plan Note (Signed)
Will renew spironolactone. I have changed her taking was drilled from twice a day to 40 mg once a day. I'll see her back in a few weeks for blood pressure check. She can also pressure measured at pulmonary rehabilitation.

## 2011-12-28 ENCOUNTER — Encounter (HOSPITAL_COMMUNITY)
Admission: RE | Admit: 2011-12-28 | Discharge: 2011-12-28 | Disposition: A | Discharge status: Home / Self Care | Payer: 59 | Source: Ambulatory Visit | Attending: Internal Medicine | Admitting: Internal Medicine

## 2012-01-02 ENCOUNTER — Encounter (HOSPITAL_COMMUNITY)
Admission: RE | Admit: 2012-01-02 | Discharge: 2012-01-02 | Disposition: A | Discharge status: Home / Self Care | Payer: 59 | Source: Ambulatory Visit | Attending: Internal Medicine | Admitting: Internal Medicine

## 2012-01-02 NOTE — Progress Notes (Signed)
Addended by: Doug Sou D on: 01/02/2012 04:23 PM   Modules accepted: Orders

## 2012-01-04 ENCOUNTER — Encounter (HOSPITAL_COMMUNITY)
Admission: RE | Admit: 2012-01-04 | Discharge: 2012-01-04 | Disposition: A | Discharge status: Home / Self Care | Payer: 59 | Source: Ambulatory Visit | Attending: Internal Medicine | Admitting: Internal Medicine

## 2012-01-04 ENCOUNTER — Emergency Department (INDEPENDENT_AMBULATORY_CARE_PROVIDER_SITE_OTHER): Payer: 59

## 2012-01-04 ENCOUNTER — Encounter (HOSPITAL_COMMUNITY): Payer: Self-pay

## 2012-01-04 ENCOUNTER — Emergency Department (HOSPITAL_COMMUNITY)
Admission: EM | Admit: 2012-01-04 | Discharge: 2012-01-04 | Disposition: A | Discharge status: Home Health | Payer: 59 | Source: Home / Self Care | Attending: Emergency Medicine | Admitting: Emergency Medicine

## 2012-01-04 DIAGNOSIS — M109 Gout, unspecified: Secondary | ICD-10-CM

## 2012-01-04 LAB — POCT I-STAT, CHEM 8
BUN: 25 mg/dL — ABNORMAL HIGH (ref 6–23)
Calcium, Ion: 1.29 mmol/L (ref 1.12–1.32)
Chloride: 109 mEq/L (ref 96–112)
HCT: 43 % (ref 36.0–46.0)
Sodium: 143 mEq/L (ref 135–145)
TCO2: 24 mmol/L (ref 0–100)

## 2012-01-04 LAB — URIC ACID: Uric Acid, Serum: 10 mg/dL — ABNORMAL HIGH (ref 2.4–7.0)

## 2012-01-04 MED ORDER — COLCHICINE 0.6 MG PO TABS: ORAL_TABLET | ORAL | Status: DC

## 2012-01-04 MED ORDER — METHYLPREDNISOLONE ACETATE 40 MG/ML IJ SUSP
INTRAMUSCULAR | Status: AC
  Filled 2012-01-04: qty 10

## 2012-01-04 MED ORDER — METHYLPREDNISOLONE ACETATE PF 80 MG/ML IJ SUSP
80.0000 mg | Freq: Once | INTRAMUSCULAR | Status: AC
  Administered 2012-01-04: 80 mg via INTRAMUSCULAR

## 2012-01-04 NOTE — ED Notes (Signed)
C/o pain in lt great toe with weight bearing  since Sunday.  Denies injury.

## 2012-01-04 NOTE — Discharge Instructions (Signed)
Gout Gout is an inflammatory condition (arthritis) caused by a buildup of uric acid crystals in the joints. Uric acid is a chemical that is normally present in the blood. Under some circumstances, uric acid can form into crystals in your joints. This causes joint redness, soreness, and swelling (inflammation). Repeat attacks are common. Over time, uric acid crystals can form into masses (tophi) near a joint, causing disfigurement. Gout is treatable and often preventable. CAUSES  The disease begins with elevated levels of uric acid in the blood. Uric acid is produced by your body when it breaks down a naturally found substance called purines. This also happens when you eat certain foods such as meats and fish. Causes of an elevated uric acid level include:  Being passed down from parent to child (heredity).   Diseases that cause increased uric acid production (obesity, psoriasis, some cancers).   Excessive alcohol use.   Diet, especially diets rich in meat and seafood.   Medicines, including certain cancer-fighting drugs (chemotherapy), diuretics, and aspirin.   Chronic kidney disease. The kidneys are no longer able to remove uric acid well.   Problems with metabolism.  Conditions strongly associated with gout include:  Obesity.   High blood pressure.   High cholesterol.   Diabetes.  Not everyone with elevated uric acid levels gets gout. It is not understood why some people get gout and others do not. Surgery, joint injury, and eating too much of certain foods are some of the factors that can lead to gout. SYMPTOMS   An attack of gout comes on quickly. It causes intense pain with redness, swelling, and warmth in a joint.   Fever can occur.   Often, only one joint is involved. Certain joints are more commonly involved:   Base of the big toe.   Knee.   Ankle.   Wrist.   Finger.  Without treatment, an attack usually goes away in a few days to weeks. Between attacks, you  usually will not have symptoms, which is different from many other forms of arthritis. DIAGNOSIS  Your caregiver will suspect gout based on your symptoms and exam. Removal of fluid from the joint (arthrocentesis) is done to check for uric acid crystals. Your caregiver will give you a medicine that numbs the area (local anesthetic) and use a needle to remove joint fluid for exam. Gout is confirmed when uric acid crystals are seen in joint fluid, using a special microscope. Sometimes, blood, urine, and X-ray tests are also used. TREATMENT  There are 2 phases to gout treatment: treating the sudden onset (acute) attack and preventing attacks (prophylaxis). Treatment of an Acute Attack  Medicines are used. These include anti-inflammatory medicines or steroid medicines.   An injection of steroid medicine into the affected joint is sometimes necessary.   The painful joint is rested. Movement can worsen the arthritis.   You may use warm or cold treatments on painful joints, depending which works best for you.   Discuss the use of coffee, vitamin C, or cherries with your caregiver. These may be helpful treatment options.  Treatment to Prevent Attacks After the acute attack subsides, your caregiver may advise prophylactic medicine. These medicines either help your kidneys eliminate uric acid from your body or decrease your uric acid production. You may need to stay on these medicines for a very long time. The early phase of treatment with prophylactic medicine can be associated with an increase in acute gout attacks. For this reason, during the first few months   of treatment, your caregiver may also advise you to take medicines usually used for acute gout treatment. Be sure you understand your caregiver's directions. You should also discuss dietary treatment with your caregiver. Certain foods such as meats and fish can increase uric acid levels. Other foods such as dairy can decrease levels. Your caregiver  can give you a list of foods to avoid. HOME CARE INSTRUCTIONS   Do not take aspirin to relieve pain. This raises uric acid levels.   Only take over-the-counter or prescription medicines for pain, discomfort, or fever as directed by your caregiver.   Rest the joint as much as possible. When in bed, keep sheets and blankets off painful areas.   Keep the affected joint raised (elevated).   Use crutches if the painful joint is in your leg.   Drink enough water and fluids to keep your urine clear or pale yellow. This helps your body get rid of uric acid. Do not drink alcoholic beverages. They slow the passage of uric acid.   Follow your caregiver's dietary instructions. Pay careful attention to the amount of protein you eat. Your daily diet should emphasize fruits, vegetables, whole grains, and fat-free or low-fat milk products.   Maintain a healthy body weight.  SEEK MEDICAL CARE IF:   You have an oral temperature above 102 F (38.9 C).   You develop diarrhea, vomiting, or any side effects from medicines.   You do not feel better in 24 hours, or you are getting worse.  SEEK IMMEDIATE MEDICAL CARE IF:   Your joint becomes suddenly more tender and you have:   Chills.   An oral temperature above 102 F (38.9 C), not controlled by medicine.  MAKE SURE YOU:   Understand these instructions.   Will watch your condition.   Will get help right away if you are not doing well or get worse.  Document Released: 10/06/2000 Document Revised: 09/28/2011 Document Reviewed: 01/17/2010 [facility] Patient Information 2012 [place].Purine Restricted Diet A low-purine diet consists of foods that reduce uric acid made in your body. INDICATIONS FOR USE  Your caregiver may ask you to follow a low-purine diet to reduce gout flairs.  GUIDELINES  Avoid high-purine foods, including all alcohol, yeast extracts taken as supplements, and sauces made from meats (like gravy). Do not eat high-purine  meats, including anchovies, sardines, herring, mussels, tuna, codfish, scallops, trout, haddock, bacon, organ meats, tripe, goose, wild game, and sweetbreads.  Grains  Allowed/Recommended: All, except those listed to consume in moderation.   Consume in Moderation: Oatmeal (? cup uncooked daily), wheat bran or germ ( cup daily), and whole grains.  Vegetables  Allowed/Recommended: All, except those listed to consume in moderation.   Consume in Moderation: Asparagus, cauliflower, spinach, mushrooms, and green peas ( cup daily).  Fruit  Allowed/Recommended: All.   Consume in Moderation: None.  Meat and Meat Substitutes  Allowed/Recommended: Eggs, nuts, and peanut butter.   Consume in Moderation: Limit to 4 to 6 oz daily. Avoid high-purine meats. Lentils, peas, and dried beans (1 cup daily).  Milk  Allowed/Recommended: All. Choose low-fat or skim when possible.   Consume in Moderation: None.  Fats and Oils  Allowed/Recommended: All.   Consume in Moderation: None.  Beverages  Allowed/Recommended: All, except those listed to avoid.   Avoid: All alcohol.  Condiments/Miscellaneous  Allowed/Recommended: All, except those listed to consume in moderation.   Consume in Moderation: Bouillon and meat-based broths and soups.  Document Released: 02/03/2011 Document Revised: 09/28/2011 Document  Reviewed: 02/03/2011 [facility] Patient Information 2012 [place].   Dietary treatment plays a supplementary role in treatment of gout.  Diet can be expected to decrease the uric acid level by 10 to 15%.  Often medication can effect a much more substantial reduction.  An extremely restrictive diet is not necessary, but here are a few suggestions that might help decrease the frequency of gout attacks.  The first and most important measure is to achieve and maintain a healthy body weight (BMI of 20 to 25).  It's been shown that people with a BMI over 25 have an increased risk of gout attacks  compared to people with a BMI of less than 25.  Also, gout patients who lose as little as 4.5 kg or 9.9 lbs will decrease their risk of gout attacks.  Beyond that, here are a few general guidelines:  Eat less: Red meat Seafood Beer and hard alcohol (e.g. gin, vodka, whiskey) Foods that contain high fructose corn syrup (found in sweets and non-diet sodas) Organ meats (liver, kidneys, brains, sweetbreads) or foods made from these meats (hot dogs, bologna).  Eat more: Low fat dairy products A moderate amount of wine (up to two 5 oz servings per day) is not likely to increase the risk of a gout attack. Coffee may decrease the risk of gout attacks Vitamin C (500 mg per day) has a mild urate lowering effect

## 2012-01-04 NOTE — ED Provider Notes (Signed)
Chief Complaint  Patient presents with  . Toe Pain    History of Present Illness:   The patient is a 31 year old female with lupus who is on multiple medications for this. For the past 5 days she's had pain, swelling, redness, and heat over the MTP joint of her left great toe. She denies any injury. She has had no other joint pains. No history of gout. No fever, chills, or sweats.  Review of Systems:  Other than noted above, the patient denies any of the following symptoms: Systemic:  No fevers, chills, sweats, or aches.  No fatigue or tiredness. Musculoskeletal:  No joint pain, arthritis, bursitis, swelling, back pain, or neck pain. Neurological:  No muscular weakness, paresthesias, headache, or trouble with speech or coordination.  No dizziness.   [place]:  Past medical history, family history, social history, meds, and allergies were reviewed.  Physical Exam:   Vital signs:  BP 132/90  Pulse 82  Temp(Src) 99 F (37.2 C) (Oral)  SpO2 99%  LMP 12/29/2011 Gen:  Alert and oriented times 3.  In no distress. Musculoskeletal: There was swelling, redness, and pain to palpation over the MTP joint of the left great toe. She has a little bit of pain on movement of the toe. No pain in any other joint. Otherwise, all joints had a full a ROM with no swelling, bruising or deformity.  No edema, pulses full. Extremities were warm and pink.  Capillary refill was brisk.  Skin:  Clear, warm and dry.  No rash. Neuro:  Alert and oriented times 3.  Muscle strength was normal.  Sensation was intact to light touch.   Radiology:  Dg Foot Complete Left  01/04/2012  *RADIOLOGY REPORT*  Clinical Data: Pain and swelling  LEFT FOOT - COMPLETE 3+ VIEW  Comparison: None.  Findings: There is no evidence of fracture or dislocation.  No cortical erosions or periosteal reaction are identified.  There is mild subchondral sclerosis at the first MTP joint which could indicate early osteoarthritis.  No soft tissue gas is  identified.  IMPRESSION: There is no evidence of acute abnormality.  Original Report Authenticated By: Duayne Cal, M.D.   Results for orders placed during the hospital encounter of 01/04/12  URIC ACID      Component Value Range   Uric Acid, Serum 10.0 (*) 2.4 - 7.0 (mg/dL)  POCT I-STAT, CHEM 8      Component Value Range   Sodium 143  135 - 145 (mEq/L)   Potassium 4.3  3.5 - 5.1 (mEq/L)   Chloride 109  96 - 112 (mEq/L)   BUN 25 (*) 6 - 23 (mg/dL)   Creatinine, Ser 1.30 (*) 0.50 - 1.10 (mg/dL)   Glucose, Bld 77  70 - 99 (mg/dL)   Calcium, Ion 1.29  1.12 - 1.32 (mmol/L)   TCO2 24  0 - 100 (mmol/L)   Hemoglobin 14.6  12.0 - 15.0 (g/dL)   HCT 43.0  36.0 - 46.0 (%)   Course in Urgent [place]:   She was given Depo-Medrol 80 mg IM and tolerated this well without any immediate side effects.   Assessment:   Diagnoses that have been ruled out:  None  Diagnoses that are still under consideration:  None  Final diagnoses:  Gout    Plan:   1.  The following meds were prescribed:   New Prescriptions   COLCHICINE 0.6 MG TABLET    Take 2 now and 1 in 1 hour.  May repeat  dose once daily.  For gout attack.   2.  The patient was instructed in symptomatic care, including rest and activity, elevation, application of ice and compression.  Appropriate handouts were given. 3.  The patient was told to return if becoming worse in any way, if no better in 3 or 4 days, and given some red flag symptoms that would indicate earlier return.   4.  The patient was told to follow up her rheumatologist within the next 2 weeks.   Harden Mo, MD 01/04/12 [phone]

## 2012-01-05 ENCOUNTER — Telehealth (HOSPITAL_COMMUNITY): Payer: Self-pay | Admitting: *Deleted

## 2012-01-05 NOTE — ED Notes (Signed)
Uric Acid: 10.0 H. Pt. treated with Colchicine. Lab shown to Dr. Jake Michaelis.  He said to notify pt. to f/u with her rheumatologist in [facility] for long term tx. of gout. I called and left message for pt. to call. Roselyn Meier 01/05/2012

## 2012-01-05 NOTE — ED Notes (Signed)
Pt. called back and verified x 2. Pt. given result  with range and told this indicates gout. Pt. instructed to f/u with her rheumatologist in [facility] for long term tx. Pt. voiced understanding. Roselyn Meier 01/05/2012

## 2012-01-09 ENCOUNTER — Encounter (HOSPITAL_COMMUNITY)
Admission: RE | Admit: 2012-01-09 | Discharge: 2012-01-09 | Disposition: A | Discharge status: Home / Self Care | Payer: 59 | Source: Ambulatory Visit | Attending: Internal Medicine | Admitting: Internal Medicine

## 2012-01-11 ENCOUNTER — Encounter (HOSPITAL_COMMUNITY)
Admission: RE | Admit: 2012-01-11 | Discharge: 2012-01-11 | Disposition: A | Discharge status: Home / Self Care | Payer: 59 | Source: Ambulatory Visit | Attending: Internal Medicine | Admitting: Internal Medicine

## 2012-01-11 NOTE — Progress Notes (Signed)
Patients blood pressures were high with exercise. Readings showed 150/100, 144/98, and came down to 122/74 during cool down. Patient denies SOB, no weight gain, no CP, and patient states she took all meds. Patient was instructed to slow down with exercise. Patient stated that she was doing more with her home exercise. She increased her work loads at home. I asked patient to monitor blood pressures over weekend and call back on Monday with results. Explained blood pressure parameters with patient and safe exercises for Pulmonary Hypertension. Patient voiced understanding. Will continue to follow and support.

## 2012-01-16 ENCOUNTER — Encounter (HOSPITAL_COMMUNITY)
Admission: RE | Admit: 2012-01-16 | Discharge: 2012-01-16 | Disposition: A | Discharge status: Home / Self Care | Payer: 59 | Source: Ambulatory Visit | Attending: Internal Medicine | Admitting: Internal Medicine

## 2012-01-18 ENCOUNTER — Encounter (HOSPITAL_COMMUNITY)
Admission: RE | Admit: 2012-01-18 | Discharge: 2012-01-18 | Disposition: A | Discharge status: Home / Self Care | Payer: 59 | Source: Ambulatory Visit | Attending: Internal Medicine | Admitting: Internal Medicine

## 2012-01-23 ENCOUNTER — Encounter (HOSPITAL_COMMUNITY)
Admission: RE | Admit: 2012-01-23 | Discharge: 2012-01-23 | Disposition: A | Discharge status: Home / Self Care | Payer: 59 | Source: Ambulatory Visit | Attending: Internal Medicine | Admitting: Internal Medicine

## 2012-01-23 DIAGNOSIS — Z8673 Personal history of transient ischemic attack (TIA), and cerebral infarction without residual deficits: Secondary | ICD-10-CM | POA: Insufficient documentation

## 2012-01-23 DIAGNOSIS — I429 Cardiomyopathy, unspecified: Secondary | ICD-10-CM | POA: Insufficient documentation

## 2012-01-23 DIAGNOSIS — M329 Systemic lupus erythematosus, unspecified: Secondary | ICD-10-CM | POA: Insufficient documentation

## 2012-01-23 DIAGNOSIS — N059 Unspecified nephritic syndrome with unspecified morphologic changes: Secondary | ICD-10-CM | POA: Insufficient documentation

## 2012-01-23 DIAGNOSIS — Z5189 Encounter for other specified aftercare: Secondary | ICD-10-CM | POA: Insufficient documentation

## 2012-01-23 DIAGNOSIS — I509 Heart failure, unspecified: Secondary | ICD-10-CM | POA: Insufficient documentation

## 2012-01-23 DIAGNOSIS — I10 Essential (primary) hypertension: Secondary | ICD-10-CM | POA: Insufficient documentation

## 2012-01-23 DIAGNOSIS — I2789 Other specified pulmonary heart diseases: Secondary | ICD-10-CM | POA: Insufficient documentation

## 2012-01-24 ENCOUNTER — Ambulatory Visit: Payer: 59 | Admitting: Cardiology

## 2012-01-25 ENCOUNTER — Encounter (HOSPITAL_COMMUNITY)
Admission: RE | Admit: 2012-01-25 | Discharge: 2012-01-25 | Disposition: A | Discharge status: Home / Self Care | Payer: 59 | Source: Ambulatory Visit | Attending: Internal Medicine | Admitting: Internal Medicine

## 2012-01-30 ENCOUNTER — Encounter (HOSPITAL_COMMUNITY)
Admission: RE | Admit: 2012-01-30 | Discharge: 2012-01-30 | Disposition: A | Discharge status: Home / Self Care | Payer: 59 | Source: Ambulatory Visit | Attending: Internal Medicine | Admitting: Internal Medicine

## 2012-02-01 ENCOUNTER — Encounter (HOSPITAL_COMMUNITY)
Admission: RE | Admit: 2012-02-01 | Discharge: 2012-02-01 | Disposition: A | Discharge status: Home / Self Care | Payer: 59 | Source: Ambulatory Visit | Attending: Internal Medicine | Admitting: Internal Medicine

## 2012-02-06 ENCOUNTER — Encounter (HOSPITAL_COMMUNITY)
Admission: RE | Admit: 2012-02-06 | Discharge: 2012-02-06 | Disposition: A | Discharge status: Home / Self Care | Payer: 59 | Source: Ambulatory Visit | Attending: Internal Medicine | Admitting: Internal Medicine

## 2012-02-06 ENCOUNTER — Telehealth: Payer: Self-pay

## 2012-02-06 NOTE — Telephone Encounter (Signed)
Per MR's last note 09/2011 pt to f/u in 3 months.  lmomtcb x1

## 2012-02-07 NOTE — Telephone Encounter (Signed)
Pt has been scheduled for follow-up with MR on 03/11/12 @ 4pm and is aware of appt.

## 2012-02-08 ENCOUNTER — Encounter (HOSPITAL_COMMUNITY)
Admission: RE | Admit: 2012-02-08 | Discharge: 2012-02-08 | Disposition: A | Discharge status: Home / Self Care | Payer: 59 | Source: Ambulatory Visit | Attending: Internal Medicine | Admitting: Internal Medicine

## 2012-02-12 NOTE — Progress Notes (Signed)
Pulmonary Rehabilitation Program Outcomes Report   Orientation:  11/07/2011 Graduate Date:  02/08/2012 Discharge Date:  02/08/2012 # of sessions completed: 24/24  Pulmonologist: Ramaswamy   Class Time:  10:30am  A.  Exercise Program:  Tolerates exercise @ 3.0 METS for 45 minutes, Walk Test Results:  Pre: 616 ft and Post: 1005 ft, Improved functional capacity  63.15 %, No Change  muscular strength  0.00 %, Improved dyspnea score 24.00 %, Improved education score 5.00 %, Patient plans to continue pulmonary rehab maintenance program, Discharged to home exercise program.  Anticipated compliance:  good and Discharged  B.  Mental Health:  Health related anxiety, Significant job stress and Quality of Life (QOL)  improvements:  Overall  49.75 %, Health/Functioning 75.51 %, Socioeconomics 8.49 %, Psych/Spiritual 41.49 %, Family 59.07 %    C.  Education/Instruction/Skills  Uses Perceived Exertion Scale and/or Dyspnea Scale and Attended 9 education classes  Home exercise given: 11/30/2011 Patient accomplished goals.   D.  Nutrition/Weight Control/Body Composition:  % Body Fat  42.7, Patient has lost 1.0 kg and Weight loss not indicated   E.  Blood Lipids    No results found for this basename: CHOL, HDL, LDLCALC, LDLDIRECT, TRIG, CHOLHDL    F.  Lifestyle Changes:  Making positive lifestyle changes  G.  Symptoms noted with exercise:  Angina exertional, Shortness of breath, Dizziness and Exertional hypertension    Comments:  Patient has graduated the [employer] pulmonary rehab program completing all personal and program goals. Patient overall quality of life improved and patient is satisfied with the outcome. Vitals stable throughout the program. Increased work loads over time and maintained SpaO2 greater than or above 95% RA. Patient plans continue exercise with our Pulmonary Maintenance class. Very compliant and delightful to work with.   Courtney L. Owens Shark, MS,  NASM, CES      Agree with the above.

## 2012-02-13 ENCOUNTER — Encounter (HOSPITAL_COMMUNITY): Payer: 59

## 2012-02-15 ENCOUNTER — Encounter (HOSPITAL_COMMUNITY): Payer: 59

## 2012-02-15 NOTE — Progress Notes (Signed)
Addendum to Nutrition Section of Pulmonary Rehab Program Progress Report  Pt with 4.69% decrease in % body fat. Pt wt down 1 kg. Wt loss desired given BMI of 37.2.

## 2012-02-19 ENCOUNTER — Telehealth: Payer: Self-pay | Admitting: *Deleted

## 2012-02-19 NOTE — Telephone Encounter (Signed)
Pt is asking Dr. Leanne Chang to write a note that she unable to stand all day at her job.  They had chairs, but are taking them away, and she will not be able to do this with her Lupus.

## 2012-02-20 ENCOUNTER — Encounter (HOSPITAL_COMMUNITY): Payer: 59

## 2012-02-20 ENCOUNTER — Encounter: Payer: Self-pay | Admitting: *Deleted

## 2012-02-20 NOTE — Telephone Encounter (Signed)
Ok to write note.  Lupus is reason

## 2012-02-20 NOTE — Telephone Encounter (Signed)
Note written and pt called to pick it up.

## 2012-02-22 ENCOUNTER — Encounter (HOSPITAL_COMMUNITY)
Admission: RE | Admit: 2012-02-22 | Discharge: 2012-02-22 | Disposition: A | Discharge status: Home / Self Care | Payer: 59 | Source: Ambulatory Visit | Attending: Internal Medicine | Admitting: Internal Medicine

## 2012-02-22 ENCOUNTER — Encounter (HOSPITAL_COMMUNITY): Payer: 59

## 2012-02-22 DIAGNOSIS — I429 Cardiomyopathy, unspecified: Secondary | ICD-10-CM | POA: Insufficient documentation

## 2012-02-22 DIAGNOSIS — I2789 Other specified pulmonary heart diseases: Secondary | ICD-10-CM | POA: Insufficient documentation

## 2012-02-22 DIAGNOSIS — Z5189 Encounter for other specified aftercare: Secondary | ICD-10-CM | POA: Insufficient documentation

## 2012-02-22 DIAGNOSIS — I509 Heart failure, unspecified: Secondary | ICD-10-CM | POA: Insufficient documentation

## 2012-02-22 DIAGNOSIS — Z8673 Personal history of transient ischemic attack (TIA), and cerebral infarction without residual deficits: Secondary | ICD-10-CM | POA: Insufficient documentation

## 2012-02-22 DIAGNOSIS — I10 Essential (primary) hypertension: Secondary | ICD-10-CM | POA: Insufficient documentation

## 2012-02-22 DIAGNOSIS — N059 Unspecified nephritic syndrome with unspecified morphologic changes: Secondary | ICD-10-CM | POA: Insufficient documentation

## 2012-02-22 DIAGNOSIS — M329 Systemic lupus erythematosus, unspecified: Secondary | ICD-10-CM | POA: Insufficient documentation

## 2012-02-23 NOTE — Progress Notes (Signed)
02/22/12 pt first session of pulmonary rehab maintenance program tolerated very well. VSS. No c/o cp or SOB.

## 2012-02-27 ENCOUNTER — Encounter (HOSPITAL_COMMUNITY): Payer: 59

## 2012-02-29 ENCOUNTER — Encounter (HOSPITAL_COMMUNITY)
Admission: RE | Admit: 2012-02-29 | Discharge: 2012-02-29 | Disposition: A | Discharge status: Home / Self Care | Payer: 59 | Source: Ambulatory Visit | Attending: Internal Medicine | Admitting: Internal Medicine

## 2012-03-04 ENCOUNTER — Other Ambulatory Visit: Payer: Self-pay | Admitting: Cardiology

## 2012-03-04 MED ORDER — FUROSEMIDE 40 MG PO TABS: 40.0000 mg | ORAL_TABLET | Freq: Every day | ORAL | Status: DC

## 2012-03-04 NOTE — Telephone Encounter (Signed)
Refill - Feurosimide  Verified preferred as [facility] [address] , patient can be reached at [phone] for additional information

## 2012-03-05 ENCOUNTER — Encounter (HOSPITAL_COMMUNITY)
Admission: RE | Admit: 2012-03-05 | Discharge: 2012-03-05 | Disposition: A | Discharge status: Home / Self Care | Payer: 59 | Source: Ambulatory Visit | Attending: Internal Medicine | Admitting: Internal Medicine

## 2012-03-07 ENCOUNTER — Encounter (HOSPITAL_COMMUNITY)
Admission: RE | Admit: 2012-03-07 | Discharge: 2012-03-07 | Disposition: A | Discharge status: Home / Self Care | Payer: 59 | Source: Ambulatory Visit | Attending: Internal Medicine | Admitting: Internal Medicine

## 2012-03-11 ENCOUNTER — Ambulatory Visit (INDEPENDENT_AMBULATORY_CARE_PROVIDER_SITE_OTHER): Payer: 59 | Admitting: Internal Medicine

## 2012-03-11 ENCOUNTER — Encounter: Payer: Self-pay | Admitting: Internal Medicine

## 2012-03-11 VITALS — BP 128/90 | HR 86 | Temp 98.3°F | Ht 67.0 in | Wt 245.2 lb

## 2012-03-11 DIAGNOSIS — I2789 Other specified pulmonary heart diseases: Secondary | ICD-10-CM

## 2012-03-11 NOTE — Patient Instructions (Signed)
I wish you were feeling better in terms of dyspnea Please continue rehab and symbicort; nurse will ensure enough refills on symbicort Please follow the [facility] diet sheet I gave you and explained; this will help with weight loss Return in 6 months - if not better, might have to consider repeat right heart cath

## 2012-03-11 NOTE — Progress Notes (Signed)
Subjective:    Patient ID: [name] [name], female    DOB: 22-Feb-1981, 31 y.o.   MRN: [id number]  HPI 31 year old female, Obese, Non smoker. > 20# weight gain since 2005. Mulitorrgan lupous (who class 2 nephritis, joints, raynaud - on cellcept, prednisone, plaquenil - ongoing active lupus 05/2010). NICM ef 25% in 2007 with normal left heart cath with resolution to normal ef by oct 2010. Presented August 2011 with doe x few years. CT chest sept 2011 - normal parenchyma. Pulmnary hypertension - rt heart cath 07/16/2010 - group 1 and 2 pulmonary hypertension (PA mean 38, PCWP 18, TPG 20, PVR 3.6 wood units, 480 m on 41mwd, lowest pulse ox 93%). Started letairis Jul 28, 2010 with 32mwd of 493m at commencement. Increase letairis to 10mg  daily 09/26/2010  OV 04/14/2011. Still dyspneic despite letairis intiation and increase in dose. Qualifies dyspnea as variable; sometimes at work with exertion for short distances but other times able to walk 1-2 miles without problems. Hot climate makes it worse.  Has associated chest tightness/pain. Dyspnea relieved by rest. Feels letairis has not helped at all. Weight has been variable; comparing prior weight appears stable. BMI is 37.6 - a year ago it was 37. REC CPST Continue letairis for now  OV 10/05/2011 follow up dyspnea and pulmonary hypertension in patient with SLE.   She was lost to followup. After many attempts at reaching her she finally showed up today. She had cardiopulmonary stress test in 04/24/2011. This showed multi-factorial causes for dyspnea including obesity, possible diastolic dysfunction and some element of pulmonary hypertension. Clearly the test did not delineate circlutary limitation from pulmonary hypertension as the main etiology for dyspnea. The test was done while she was on letairis. She has now run out of it  one month ago.. She says that letairis did not make any difference in her dyspnea either when she was on it or now off of it. She's  again vague with her this history. Dyspnea appears randomly. It appears it is episodic and sometimes exertional. She gives history that prednisone burst helped resolve dyspnea. Denies any triggers like dust perfumes but humid air made it worse. No  cough or wheezing.  CPST 04/25/11 (while on letairis)   The RER of 1.06 indicates a near maximal aerobic effort. The peak VO2 is mildly reduced at 13.8 ml/kg/min (68% of the age/gender/weight matched sedentary norm). When adjusted to the patient's ideal body weight of 149.5 lb (67.8 kg) the peak VO2 is 22 ml/kg (ibw)/min (77% of the IBW adjusted predicted peak VO2). This correction towards normal suggests obesity might be playing a role in dyspnea. The VO2 at the ventilatory threshold is below normal at only 39% of the predicted peak VO2. At peak exercise the ventilation reached 79% of the measured MVV indicating ventilatory limits were being approached. The respiratory rate at peak exercise was quite excessive reaching 66 bpm, and is consistent with the restrictive findings in her spirometry (Vt = 1.04 L; Vt/IC = 63%). The PETCO2 also reflects her elevated respiratory rate remaining at or below 30 mm Hg throughout rest and exercise. Though the HR was adequate in terms of evaluating ECG changes and to determine the chronotropic effect of her beta-blockade, the HR reserve is still mildly elevated at 23 bpm. The O2pulse (a surrogate for stroke volume) increased slowly throughout the exercise reaching 9 ml/beat (75% of predicted). The VE/VCO2 slope is grossly elevated and reflects excessive dead space ventilation and pronounced ventilatory inefficiency. The oxygen  uptake efficiency slope (OUES) is mildly reduced and reflects her exercise capacity.  Conclusion Exercise testing with gas exchange demonstrates a mild functional limitation when compared to matched sedentary norms, but is improved when adjusted to ideal body weight. These results suggests the  patient's functional limitation and exercise intolerance is multifactoral (obesity, restrictve ventilatory limitation and pulmonary hypertension). Mixed picture   Past, Family, Social reviewed: no change since last visit though has gained weight. Body mass index is 38.56 kg/(m^2). which is 1 point up.  She just under weight watchers program one week ago. In terms of lupus Rx: cellcept changed to immuran but prednisone and plaquenil are ongoing.  REC I think the multiple reasons for shortness of breath  It is possible that she might have asthma. Therefore, please try symbicortr 2 puff twice daily 160/4.5dose - daily wihtout fail  To improve physical fitness please joint pulmonary rehabilitation at [facility]-we will make a referral  Please continue with Weight Watchers program and efforts at weight loss  I will see her in 3 months and reassess  We will hold off on restarting letairis for now  did you have flu shot? If not, have one today   OV 03/11/2012  No change in dyspnea  rehab and symbicort not helping Has gained weight Body mass index is 38.40 kg/(m^2). on 03/11/2012   Discussion only visit    Current outpatient prescriptions:amLODipine (NORVASC) 5 MG tablet, Take 5 mg by mouth daily.  , Disp: , Rfl: ;  azaTHIOprine (IMURAN) 50 MG tablet, Take 100 mg by mouth daily. , Disp: , Rfl: ;  budesonide-formoterol (SYMBICORT) 160-4.5 MCG/ACT inhaler, Inhale 2 puffs into the lungs 2 (two) times daily., Disp: 1 Inhaler, Rfl: 3 colchicine 0.6 MG tablet, Take 2 now and 1 in 1 hour.  May repeat dose once daily.  For gout attack., Disp: 12 tablet, Rfl: 0;  furosemide (LASIX) 40 MG tablet, Take 1 tablet (40 mg total) by mouth daily., Disp: 30 tablet, Rfl: 2;  hydroxychloroquine (PLAQUENIL) 200 MG tablet, TAKE 1 TABLET BY MOUTH TWICE DAILY, Disp: 120 tablet, Rfl: 1;  lisinopril (PRINIVIL,ZESTRIL) 40 MG tablet, Take 1 tablet (40 mg total) by mouth daily., Disp: 90 tablet, Rfl: 3 metoprolol  (LOPRESSOR) 100 MG tablet, Take 1 tablet (100 mg total) by mouth 2 (two) times daily., Disp: 60 tablet, Rfl: 11;  predniSONE (DELTASONE) 10 MG tablet, Take 10 mg by mouth daily.  , Disp: , Rfl: ;  spironolactone (ALDACTONE) 25 MG tablet, Take 1 tablet (25 mg total) by mouth daily., Disp: 90 tablet, Rfl: 3    Review of Systems  Constitutional: Negative for fever and unexpected weight change.  HENT: Negative for ear pain, nosebleeds, congestion, sore throat, rhinorrhea, sneezing, trouble swallowing, dental problem, postnasal drip and sinus pressure.   Eyes: Negative for redness and itching.  Respiratory: Negative for cough, chest tightness, shortness of breath and wheezing.   Cardiovascular: Negative for palpitations and leg swelling.  Gastrointestinal: Negative for nausea and vomiting.  Genitourinary: Negative for dysuria.  Musculoskeletal: Negative for joint swelling.  Skin: Negative for rash.  Neurological: Negative for headaches.  Hematological: Does not bruise/bleed easily.  Psychiatric/Behavioral: Negative for dysphoric mood. The patient is not nervous/anxious.        Objective:   Physical Exam Vitals reviewed. Constitutional: She is oriented to person, place, and time. She appears well-developed and well-nourished. No distress.       obese  Overall unchanged  discusison only visi  Assessment & Plan:

## 2012-03-12 ENCOUNTER — Encounter (HOSPITAL_COMMUNITY)
Admission: RE | Admit: 2012-03-12 | Discharge: 2012-03-12 | Disposition: A | Discharge status: Home / Self Care | Payer: 59 | Source: Ambulatory Visit | Attending: Internal Medicine | Admitting: Internal Medicine

## 2012-03-14 ENCOUNTER — Encounter (HOSPITAL_COMMUNITY): Payer: 59

## 2012-03-18 NOTE — Assessment & Plan Note (Signed)
She is no better in terms of dyspnea despite pulmonary rehab. So far rehab, symbicort, or letairis trial have failed to relieve her dyspnea. Through all this no weight loss (while noting that CPST showed mixed picture). I have strongly asked her to take efforts to lose weight as a trial to see if this would help dysponea. WE went over a low glycemic diet sheet plan that is advocated by [facility] endocrine clinic. She is clearly eating high bad carb diet on daily basis. I have advised her to stick to low glycemic foods. She has agreed to try. ROV 6 months. If still dyspneic, might need Right heart cath  > 50% of this 15 min visit spent in face to face counseling

## 2012-03-19 ENCOUNTER — Encounter (HOSPITAL_COMMUNITY)
Admission: RE | Admit: 2012-03-19 | Discharge: 2012-03-19 | Disposition: A | Discharge status: Home / Self Care | Payer: 59 | Source: Ambulatory Visit | Attending: Internal Medicine | Admitting: Internal Medicine

## 2012-03-21 ENCOUNTER — Encounter (HOSPITAL_COMMUNITY): Payer: 59

## 2012-03-26 ENCOUNTER — Encounter (HOSPITAL_COMMUNITY): Payer: 59 | Attending: Internal Medicine

## 2012-03-26 DIAGNOSIS — Z8673 Personal history of transient ischemic attack (TIA), and cerebral infarction without residual deficits: Secondary | ICD-10-CM | POA: Insufficient documentation

## 2012-03-26 DIAGNOSIS — I429 Cardiomyopathy, unspecified: Secondary | ICD-10-CM | POA: Insufficient documentation

## 2012-03-26 DIAGNOSIS — I10 Essential (primary) hypertension: Secondary | ICD-10-CM | POA: Insufficient documentation

## 2012-03-26 DIAGNOSIS — I509 Heart failure, unspecified: Secondary | ICD-10-CM | POA: Insufficient documentation

## 2012-03-26 DIAGNOSIS — Z5189 Encounter for other specified aftercare: Secondary | ICD-10-CM | POA: Insufficient documentation

## 2012-03-26 DIAGNOSIS — I2789 Other specified pulmonary heart diseases: Secondary | ICD-10-CM | POA: Insufficient documentation

## 2012-03-26 DIAGNOSIS — M329 Systemic lupus erythematosus, unspecified: Secondary | ICD-10-CM | POA: Insufficient documentation

## 2012-03-26 DIAGNOSIS — N059 Unspecified nephritic syndrome with unspecified morphologic changes: Secondary | ICD-10-CM | POA: Insufficient documentation

## 2012-03-27 NOTE — Progress Notes (Signed)
PULMONARY MAINTENANCE REHAB OUTCOMES REPORT    Patient has completed one month of pulmonary maintenance rehab. Patient did great with the program. VSS. Patient completed the weight training with no problems. Patient has learned how to conserve energy as well as PLB. Patient received a promotion with her job so she had to drop for right now. Patient stated she will call if she can come back later on. Great to work with. All exercise records are scanned in.  Alianah Lofton L. Owens Shark, MS, NASM, CES

## 2012-03-28 ENCOUNTER — Encounter (HOSPITAL_COMMUNITY): Payer: 59

## 2012-04-02 ENCOUNTER — Encounter (HOSPITAL_COMMUNITY): Payer: 59

## 2012-04-04 ENCOUNTER — Encounter (HOSPITAL_COMMUNITY): Payer: 59

## 2012-04-09 ENCOUNTER — Encounter (HOSPITAL_COMMUNITY): Payer: 59

## 2012-04-11 ENCOUNTER — Encounter (HOSPITAL_COMMUNITY): Payer: 59

## 2012-04-16 ENCOUNTER — Encounter (HOSPITAL_COMMUNITY): Payer: 59

## 2012-04-18 ENCOUNTER — Encounter (HOSPITAL_COMMUNITY): Payer: 59

## 2012-04-23 ENCOUNTER — Encounter (HOSPITAL_COMMUNITY): Payer: 59

## 2012-04-25 ENCOUNTER — Encounter (HOSPITAL_COMMUNITY): Payer: 59

## 2012-04-30 ENCOUNTER — Encounter (HOSPITAL_COMMUNITY): Payer: 59

## 2012-05-02 ENCOUNTER — Encounter (HOSPITAL_COMMUNITY): Payer: 59

## 2012-05-07 ENCOUNTER — Telehealth: Payer: Self-pay | Admitting: Internal Medicine

## 2012-05-07 ENCOUNTER — Encounter (HOSPITAL_COMMUNITY): Payer: 59

## 2012-05-07 NOTE — Telephone Encounter (Signed)
Caller: [name] [name]/Patient; PCP: Phoebe Sharps; CB#: [phone]; ; ; Call regarding Leg Cramping during Nights, onset 7-11.   Pt on Spirlactone, Furosemide and Prednisone.  All emergent sxs r/o per Muscle Pain Protocol.  Advised Pt to call back on 7-17 for an appt due to cramping interferring with sleep.  Pt verbalized understanding.

## 2012-05-09 ENCOUNTER — Encounter (HOSPITAL_COMMUNITY): Payer: 59

## 2012-05-09 ENCOUNTER — Ambulatory Visit: Payer: 59 | Admitting: Physician Assistant

## 2012-05-09 VITALS — BP 144/88 | HR 118 | Temp 100.0°F | Resp 18 | Ht 66.0 in | Wt 252.0 lb

## 2012-05-09 DIAGNOSIS — L0291 Cutaneous abscess, unspecified: Secondary | ICD-10-CM

## 2012-05-09 DIAGNOSIS — R509 Fever, unspecified: Secondary | ICD-10-CM

## 2012-05-09 DIAGNOSIS — L0390 Cellulitis, unspecified: Secondary | ICD-10-CM

## 2012-05-09 DIAGNOSIS — R252 Cramp and spasm: Secondary | ICD-10-CM

## 2012-05-09 DIAGNOSIS — I10 Essential (primary) hypertension: Secondary | ICD-10-CM

## 2012-05-09 LAB — POCT CBC
Hemoglobin: 11.5 g/dL — AB (ref 12.2–16.2)
Lymph, poc: 0.9 (ref 0.6–3.4)
MCH, POC: 28.4 pg (ref 27–31.2)
MCHC: 31.2 g/dL — AB (ref 31.8–35.4)
MCV: 91.1 fL (ref 80–97)
POC MID %: 9.1 %M (ref 0–12)
RBC: 4.05 M/uL (ref 4.04–5.48)
WBC: 5.3 10*3/uL (ref 4.6–10.2)

## 2012-05-09 MED ORDER — CLINDAMYCIN HCL 300 MG PO CAPS: 300.0000 mg | ORAL_CAPSULE | Freq: Three times a day (TID) | ORAL | Status: AC

## 2012-05-09 MED ORDER — ACETAMINOPHEN 325 MG PO TABS
1000.0000 mg | ORAL_TABLET | Freq: Once | ORAL | Status: AC
  Administered 2012-05-09: 975 mg via ORAL

## 2012-05-09 NOTE — Progress Notes (Signed)
Subjective:    Patient ID: [name] [name], female    DOB: 06-02-1981, 31 y.o.   MRN: [id number]  HPI30 yr old AAF with multiple health problems presents with the following complaints: Leg cramps 1 week ago that awakened her from sleep and she says her feet were flexed and it took her some time to straighten them out. She has had some leg cramping on and off since. She has swelling in her legs frequently and doesn't feel they are more swollen than usual.  Denies SOB or CP. She has no h/o blood clots. She had a tender lump on her L mi-upper back for the last several days that her sister expressed grayish cottage-cheese type material out of yesterday.  Yesterday and Today she has developed a tooth ache in a tooth she had a root canal in about 1 year ago and feels achey all over.  Also has developed fever.  It has "been a while" since she has seen her regular doctor Dr. Leanne Chang.  She denies urinary s/sx.  Admits to nausea without vomiting.  Review of Systems  All other systems reviewed and are negative.       Objective:   Physical Exam  Nursing note and vitals reviewed. Constitutional: She is oriented to person, place, and time. She appears well-developed and well-nourished.  HENT:  Head: Normocephalic and atraumatic.  Right Ear: External ear normal.  Left Ear: External ear normal.  Mouth/Throat: Oropharynx is clear and moist.       No obvious dental abscess or gum swelling.  Neck: Normal range of motion. Neck supple.  Cardiovascular: Normal rate, regular rhythm and normal heart sounds.  Exam reveals no gallop and no friction rub.   No murmur heard. Pulmonary/Chest: Effort normal and breath sounds normal.  Musculoskeletal: She exhibits edema (B calves and lower legs with 1+ pitting edema= B.  Negative Homan's sign. no erythema over calves.).  Lymphadenopathy:    She has no cervical adenopathy.  Neurological: She is alert and oriented to person, place, and time.  Skin: Skin is warm and  dry.  Psychiatric: She has a normal mood and affect. Her behavior is normal. Thought content normal.  R mid upper back at about at level of bra strap ~1cm sebaceous cyst that appears irritated or to be mildly infected.  No induration or fluctuance.  R calf 39cm, L calf 38cm Results for orders placed in visit on 05/09/12  POCT CBC      Component Value Range   WBC 5.3  4.6 - 10.2 K/uL   Lymph, poc 0.9  0.6 - 3.4   POC LYMPH PERCENT 17.5  10 - 50 %L   MID (cbc) 0.5  0 - 0.9   POC MID % 9.1  0 - 12 %M   POC Granulocyte 3.9  2 - 6.9   Granulocyte percent 73.4  37 - 80 %G   RBC 4.05  4.04 - 5.48 M/uL   Hemoglobin 11.5 (*) 12.2 - 16.2 g/dL   HCT, POC 36.9 (*) 37.7 - 47.9 %   MCV 91.1  80 - 97 fL   MCH, POC 28.4  27 - 31.2 pg   MCHC 31.2 (*) 31.8 - 35.4 g/dL   RDW, POC 14.2     Platelet Count, POC 259  142 - 424 K/uL   MPV 9.3  0 - 99.8 fL       Assessment & Plan:  Leg cramps-unknown etiology.  Certainly on as many medications and with  as many comorbidities as she has, an electrolyte abnormality is of concern.  No sign of blood clot at this point and time, but we did discuss this and should she have an increase in leg pain or leg swelling, sob, etc, she understands to immediately go to the hospital/call 911 if severe. Tooth pain Slightly inflamed/infected sebaceous cyst- will cover with clinda for this as well as mouth pathogens if her tooth pain is from early infection or abscess. Tachycardia and elevated BP likely due to fever and rate has already decreased to 92bpm 45mins after tylenlol. F/up w/ Dr. Leanne Chang next week.

## 2012-05-10 LAB — COMPREHENSIVE METABOLIC PANEL
BUN: 66 mg/dL — ABNORMAL HIGH (ref 6–23)
CO2: 17 mEq/L — ABNORMAL LOW (ref 19–32)
Calcium: 8.5 mg/dL (ref 8.4–10.5)
Chloride: 109 mEq/L (ref 96–112)
Creat: 1.88 mg/dL — ABNORMAL HIGH (ref 0.50–1.10)
Glucose, Bld: 82 mg/dL (ref 70–99)
Total Bilirubin: 0.3 mg/dL (ref 0.3–1.2)

## 2012-05-14 ENCOUNTER — Encounter (HOSPITAL_COMMUNITY): Payer: 59

## 2012-05-15 ENCOUNTER — Telehealth: Payer: Self-pay | Admitting: Internal Medicine

## 2012-05-15 NOTE — Telephone Encounter (Signed)
Caller: [name] [name]/Patient; PCP: Phoebe Sharps; CB#: [phone]; Call regarding Lab Results Were Abnormal;  Was seen at [facility]/10/12 for cramping in shins d/t unable to get appt at office. UC advised follow up with PCP d/t abnormal labs including Potassium, Co2, Creatnine and BUN.  LMP:05/05/12. Not sexually active. No current leg cramping. Advised to see MD within 24 hrs per nursing judgment for caller with non urgent question per PCP Call, No Triage Guideline.  Requested appt after 1415 05/16/12.  Appt scheduled for 1430 05/16/12 with Cher Nakai, FNP.

## 2012-05-16 ENCOUNTER — Encounter (HOSPITAL_COMMUNITY): Payer: 59

## 2012-05-16 ENCOUNTER — Ambulatory Visit (INDEPENDENT_AMBULATORY_CARE_PROVIDER_SITE_OTHER): Payer: 59 | Admitting: Family

## 2012-05-16 ENCOUNTER — Encounter: Payer: Self-pay | Admitting: Family

## 2012-05-16 ENCOUNTER — Other Ambulatory Visit: Payer: Self-pay | Admitting: Internal Medicine

## 2012-05-16 VITALS — BP 110/70 | Temp 97.9°F | Wt 250.0 lb

## 2012-05-16 DIAGNOSIS — I10 Essential (primary) hypertension: Secondary | ICD-10-CM

## 2012-05-16 DIAGNOSIS — R252 Cramp and spasm: Secondary | ICD-10-CM

## 2012-05-16 NOTE — Progress Notes (Signed)
Subjective:    Patient ID: [name] [name], female    DOB: [birth year], 31 y.o.   MRN: [id number]  HPI 31 year old African American female, nonsmoker, patient of Dr. Leanne Chang is in for recheck of cramps in her legs. She was seen at the urgent care clinic with leg cramps. She was found to be slightly dehydrated and Spirolactalone home was stopped. Since then, she's been to a much better. Minimal cramping in her legs. She typically has edema. She eats about 5 fast food meals a week. Tries to watch her salt intake, but does consume moderate amount of salt.   Review of Systems  Constitutional: Negative.   HENT: Negative.   Respiratory: Negative.   Cardiovascular: Negative.   Gastrointestinal: Negative.   Musculoskeletal:       Leg cramps, improved  Skin: Negative.   Neurological: Negative.   Hematological: Negative.   Psychiatric/Behavioral: Negative.    Past Medical History  Diagnosis Date  . Unspecified essential hypertension   . Unspecified deficiency anemia   . Lupus erythematosus   . Systemic lupus erythematosus   . Secondary cardiomyopathy, unspecified   . ERYTHEMATOSUS, LUPUS 08/07/2006  . CHF (congestive heart failure) 5-6 yrs ago  hosp  . Chronic kidney disease     sees specialist [place]  f/u lupus    History   Social History  . Marital Status: Single    Spouse Name: N/A    Number of Children: N/A  . Years of Education: N/A   Occupational History  . retail     [employer]   Social History Main Topics  . Smoking status: Never Smoker   . Smokeless tobacco: Never Used  . Alcohol Use: Yes     socially  . Drug Use: No  . Sexually Active: No   Other Topics Concern  . Not on file   Social History Narrative  . No narrative on file    No past surgical history on file.  Family History  Problem Relation Age of Onset  . Lupus Mother   . Arthritis Maternal Grandmother     No Known Allergies  Current Outpatient Prescriptions on File Prior to Visit  Medication  Sig Dispense Refill  . amLODipine (NORVASC) 5 MG tablet Take 5 mg by mouth daily.        Marland Kitchen azaTHIOprine (IMURAN) 50 MG tablet Take 100 mg by mouth daily.       . budesonide-formoterol (SYMBICORT) 160-4.5 MCG/ACT inhaler Inhale 2 puffs into the lungs 2 (two) times daily.  1 Inhaler  3  . clindamycin (CLEOCIN) 300 MG capsule Take 1 capsule (300 mg total) by mouth 3 (three) times daily.  30 capsule  0  . colchicine 0.6 MG tablet Take 2 now and 1 in 1 hour.  May repeat dose once daily.  For gout attack.  12 tablet  0  . furosemide (LASIX) 40 MG tablet Take 1 tablet (40 mg total) by mouth daily.  30 tablet  2  . hydroxychloroquine (PLAQUENIL) 200 MG tablet TAKE 1 TABLET BY MOUTH TWICE DAILY  120 tablet  1  . lisinopril (PRINIVIL,ZESTRIL) 40 MG tablet Take 1 tablet (40 mg total) by mouth daily.  90 tablet  3  . metoprolol (LOPRESSOR) 100 MG tablet Take 1 tablet (100 mg total) by mouth 2 (two) times daily.  60 tablet  11  . predniSONE (DELTASONE) 10 MG tablet Take 10 mg by mouth daily.        Marland Kitchen spironolactone (ALDACTONE) 25 MG tablet  Take 1 tablet (25 mg total) by mouth daily.  90 tablet  3    BP 110/70  Temp 97.9 F (36.6 C) (Oral)  Wt 250 lb (113.399 kg)  LMP 07/17/2013chart and    Objective:   Physical Exam  Constitutional: She is oriented to person, place, and time. She appears well-developed and well-nourished.  HENT:  Right Ear: External ear normal.  Left Ear: External ear normal.  Nose: Nose normal.  Mouth/Throat: Oropharynx is clear and moist.  Neck: Normal range of motion. Neck supple.  Cardiovascular: Normal rate, regular rhythm and normal heart sounds.   Pulmonary/Chest: Effort normal and breath sounds normal.  Abdominal: Soft. Bowel sounds are normal.  Musculoskeletal: Normal range of motion.  Neurological: She is alert and oriented to person, place, and time.  Skin: Skin is warm and dry.  Psychiatric: She has a normal mood and affect.  the        Assessment & Plan:    Assessment: Leg cramps, edema, hypertension  Plan: We will continue to hold spiralactalone. Drink plenty of fluids. We'll bring patient back for recheck in 6 weeks and sooner when necessary. Continue current medications.and

## 2012-05-21 ENCOUNTER — Encounter (HOSPITAL_COMMUNITY): Payer: 59

## 2012-05-22 ENCOUNTER — Other Ambulatory Visit (INDEPENDENT_AMBULATORY_CARE_PROVIDER_SITE_OTHER): Payer: 59

## 2012-05-22 DIAGNOSIS — Z0000 Encounter for general adult medical examination without abnormal findings: Secondary | ICD-10-CM

## 2012-05-22 LAB — BASIC METABOLIC PANEL
CO2: 20 mEq/L (ref 19–32)
Calcium: 8.4 mg/dL (ref 8.4–10.5)
Chloride: 110 mEq/L (ref 96–112)
Glucose, Bld: 95 mg/dL (ref 70–99)
Potassium: 5.1 mEq/L (ref 3.5–5.1)
Sodium: 138 mEq/L (ref 135–145)

## 2012-05-23 ENCOUNTER — Encounter (HOSPITAL_COMMUNITY): Payer: 59

## 2012-05-27 ENCOUNTER — Ambulatory Visit: Payer: 59 | Admitting: Emergency Medicine

## 2012-05-27 ENCOUNTER — Ambulatory Visit: Payer: 59

## 2012-05-27 VITALS — BP 146/92 | HR 85 | Temp 98.1°F | Resp 16 | Ht 67.0 in | Wt 249.0 lb

## 2012-05-27 DIAGNOSIS — M25579 Pain in unspecified ankle and joints of unspecified foot: Secondary | ICD-10-CM

## 2012-05-27 DIAGNOSIS — M109 Gout, unspecified: Secondary | ICD-10-CM

## 2012-05-27 LAB — POCT CBC
Granulocyte percent: 83.4 %G — AB (ref 37–80)
HCT, POC: 40.1 % (ref 37.7–47.9)
Hemoglobin: 12.1 g/dL — AB (ref 12.2–16.2)
Lymph, poc: 0.6 (ref 0.6–3.4)
MCH, POC: 27.1 pg (ref 27–31.2)
MCHC: 30.2 g/dL — AB (ref 31.8–35.4)
MCV: 89.6 fL (ref 80–97)
MID (cbc): 0.2 (ref 0–0.9)
MPV: 8.8 fL (ref 0–99.8)
POC Granulocyte: 4.3 (ref 2–6.9)
POC LYMPH PERCENT: 12.2 %L (ref 10–50)
POC MID %: 4.4 %M (ref 0–12)
Platelet Count, POC: 286 10*3/uL (ref 142–424)
RBC: 4.47 M/uL (ref 4.04–5.48)
RDW, POC: 13.3 %
WBC: 5.2 10*3/uL (ref 4.6–10.2)

## 2012-05-27 LAB — URIC ACID: Uric Acid, Serum: 10.7 mg/dL — ABNORMAL HIGH (ref 2.4–7.0)

## 2012-05-27 MED ORDER — INDOMETHACIN 25 MG PO CAPS: 25.0000 mg | ORAL_CAPSULE | Freq: Two times a day (BID) | ORAL | Status: DC

## 2012-05-27 NOTE — Progress Notes (Signed)
  Subjective:    Patient ID: [name] [name], female    DOB: October 05, 1981, 31 y.o.   MRN: [id number]  HPI Patient presents with 2 day history of severe left ankle pain.  Denies any injury to the affected ankle.  Does admit to a history of similar "episodes" in the past but they have not been as severe.  Says that the flares have been in several different joints including great toe, ankle, and foot.  She has taken only tylenol which has not helped with the pain.  Admits to pain with ROM and walking.  No fevers, chills, warmth, or erythema.     Review of Systems  All other systems reviewed and are negative.       Objective:   Physical Exam  Constitutional: She is oriented to person, place, and time. She appears well-developed and well-nourished.  HENT:  Head: Normocephalic and atraumatic.  Right Ear: External ear normal.  Left Ear: External ear normal.  Eyes: Conjunctivae are normal.  Neck: Normal range of motion.  Cardiovascular: Normal rate, regular rhythm and normal heart sounds.   Pulmonary/Chest: Effort normal and breath sounds normal.  Musculoskeletal:       Left ankle: She exhibits decreased range of motion and swelling. She exhibits no ecchymosis, no deformity, no laceration and normal pulse. tenderness (along anterior ankle).  Neurological: She is alert and oriented to person, place, and time.  Skin: Skin is warm, dry and intact.  Psychiatric: She has a normal mood and affect. Her behavior is normal. Judgment and thought content normal.      [facility] reading (PRIMARY) by  Dr. Everlene Farrier as normal ankle.      Assessment & Plan:   1. Ankle pain  POCT CBC, Uric Acid, DG Ankle Complete Left  2. Gout     Will treat with indomethacin 25 mg tid x 10 days.  Out of work for today and tomorrow.  If no improvement can consider further arthritis evaluation.

## 2012-05-27 NOTE — Patient Instructions (Addendum)
Gout Gout is an inflammatory condition (arthritis) caused by a buildup of uric acid crystals in the joints. Uric acid is a chemical that is normally present in the blood. Under some circumstances, uric acid can form into crystals in your joints. This causes joint redness, soreness, and swelling (inflammation). Repeat attacks are common. Over time, uric acid crystals can form into masses (tophi) near a joint, causing disfigurement. Gout is treatable and often preventable. CAUSES  The disease begins with elevated levels of uric acid in the blood. Uric acid is produced by your body when it breaks down a naturally found substance called purines. This also happens when you eat certain foods such as meats and fish. Causes of an elevated uric acid level include:  Being passed down from parent to child (heredity).   Diseases that cause increased uric acid production (obesity, psoriasis, some cancers).   Excessive alcohol use.   Diet, especially diets rich in meat and seafood.   Medicines, including certain cancer-fighting drugs (chemotherapy), diuretics, and aspirin.   Chronic kidney disease. The kidneys are no longer able to remove uric acid well.   Problems with metabolism.  Conditions strongly associated with gout include:  Obesity.   High blood pressure.   High cholesterol.   Diabetes.  Not everyone with elevated uric acid levels gets gout. It is not understood why some people get gout and others do not. Surgery, joint injury, and eating too much of certain foods are some of the factors that can lead to gout. SYMPTOMS   An attack of gout comes on quickly. It causes intense pain with redness, swelling, and warmth in a joint.   Fever can occur.   Often, only one joint is involved. Certain joints are more commonly involved:   Base of the big toe.   Knee.   Ankle.   Wrist.   Finger.  Without treatment, an attack usually goes away in a few days to weeks. Between attacks, you  usually will not have symptoms, which is different from many other forms of arthritis. DIAGNOSIS  Your caregiver will suspect gout based on your symptoms and exam. Removal of fluid from the joint (arthrocentesis) is done to check for uric acid crystals. Your caregiver will give you a medicine that numbs the area (local anesthetic) and use a needle to remove joint fluid for exam. Gout is confirmed when uric acid crystals are seen in joint fluid, using a special microscope. Sometimes, blood, urine, and X-ray tests are also used. TREATMENT  There are 2 phases to gout treatment: treating the sudden onset (acute) attack and preventing attacks (prophylaxis). Treatment of an Acute Attack  Medicines are used. These include anti-inflammatory medicines or steroid medicines.   An injection of steroid medicine into the affected joint is sometimes necessary.   The painful joint is rested. Movement can worsen the arthritis.   You may use warm or cold treatments on painful joints, depending which works best for you.   Discuss the use of coffee, vitamin C, or cherries with your caregiver. These may be helpful treatment options.  Treatment to Prevent Attacks After the acute attack subsides, your caregiver may advise prophylactic medicine. These medicines either help your kidneys eliminate uric acid from your body or decrease your uric acid production. You may need to stay on these medicines for a very long time. The early phase of treatment with prophylactic medicine can be associated with an increase in acute gout attacks. For this reason, during the first few months   of treatment, your caregiver may also advise you to take medicines usually used for acute gout treatment. Be sure you understand your caregiver's directions. You should also discuss dietary treatment with your caregiver. Certain foods such as meats and fish can increase uric acid levels. Other foods such as dairy can decrease levels. Your caregiver  can give you a list of foods to avoid. HOME CARE INSTRUCTIONS   Do not take aspirin to relieve pain. This raises uric acid levels.   Only take over-the-counter or prescription medicines for pain, discomfort, or fever as directed by your caregiver.   Rest the joint as much as possible. When in bed, keep sheets and blankets off painful areas.   Keep the affected joint raised (elevated).   Use crutches if the painful joint is in your leg.   Drink enough water and fluids to keep your urine clear or pale yellow. This helps your body get rid of uric acid. Do not drink alcoholic beverages. They slow the passage of uric acid.   Follow your caregiver's dietary instructions. Pay careful attention to the amount of protein you eat. Your daily diet should emphasize fruits, vegetables, whole grains, and fat-free or low-fat milk products.   Maintain a healthy body weight.  SEEK MEDICAL CARE IF:   You have an oral temperature above 102 F (38.9 C).   You develop diarrhea, vomiting, or any side effects from medicines.   You do not feel better in 24 hours, or you are getting worse.  SEEK IMMEDIATE MEDICAL CARE IF:   Your joint becomes suddenly more tender and you have:   Chills.   An oral temperature above 102 F (38.9 C), not controlled by medicine.  MAKE SURE YOU:   Understand these instructions.   Will watch your condition.   Will get help right away if you are not doing well or get worse.  Document Released: 10/06/2000 Document Revised: 09/28/2011 Document Reviewed: 01/17/2010 Exit[employer] Patient Information 2012 [employer]. 

## 2012-05-28 ENCOUNTER — Encounter (HOSPITAL_COMMUNITY): Payer: 59

## 2012-05-30 ENCOUNTER — Encounter (HOSPITAL_COMMUNITY): Payer: 59

## 2012-06-04 ENCOUNTER — Encounter (HOSPITAL_COMMUNITY): Payer: 59

## 2012-06-06 ENCOUNTER — Encounter (HOSPITAL_COMMUNITY): Payer: 59

## 2012-06-11 ENCOUNTER — Encounter (HOSPITAL_COMMUNITY): Payer: 59

## 2012-06-13 ENCOUNTER — Encounter (HOSPITAL_COMMUNITY): Payer: 59

## 2012-06-18 ENCOUNTER — Encounter (HOSPITAL_COMMUNITY): Payer: 59

## 2012-06-20 ENCOUNTER — Encounter (HOSPITAL_COMMUNITY): Payer: 59

## 2012-06-21 ENCOUNTER — Telehealth: Payer: Self-pay | Admitting: Internal Medicine

## 2012-06-21 NOTE — Telephone Encounter (Signed)
Caller: [name] [name]/Patient; Patient Name: [name] [name]; PCP: Phoebe Sharps (Adults only); [facility] Phone Number: [phone].  Call regarding Diarrhea and intermittent Abdominal Pain, onset 2 weeks.  Afebrile.  Patient complains of mucus in each episode.  All emergent symptoms ruled out per Diarrhea Protocol, see in 24 hours.  Advised Patient to call back on 8-31 for an appointment at [facility] due to no availablity left on 8-30 and Patient can wait 24 hours to be seen.  Discussed hydration and starchy foods that are easy to digest with diarrhea.  Patient verbalized understanding.

## 2012-06-22 ENCOUNTER — Ambulatory Visit (INDEPENDENT_AMBULATORY_CARE_PROVIDER_SITE_OTHER): Payer: 59 | Admitting: Internal Medicine

## 2012-06-22 ENCOUNTER — Encounter: Payer: Self-pay | Admitting: Internal Medicine

## 2012-06-22 VITALS — BP 130/84 | HR 96 | Temp 98.6°F | Ht 67.0 in | Wt 244.0 lb

## 2012-06-22 DIAGNOSIS — R197 Diarrhea, unspecified: Secondary | ICD-10-CM

## 2012-06-22 NOTE — Patient Instructions (Signed)
Please call [facility] on Tuesday to get specimen containers to send stool for culture and C difficile

## 2012-06-22 NOTE — Progress Notes (Signed)
Subjective:    Patient ID: [name] [name], female    DOB: 03/26/1981, 31 y.o.   MRN: [id number]  HPI Having cramping and diarrhea for 2 weeks Intermittent  Will have cramp---then goes to bathroom and pain is relieves it temporarily Never had this before other than brief spells  Last night had fever to 100.2 No chills but slight sweat last night No normal stools in the 2 weeks---will have some loose stools with substance in between watery ones Stool quantity 3-4 and up to 12 per day No sig abdominal pain No blood in stool Some change in appetite of late--some nausea  Was on antibiotic for tooth about a month ago  Current Outpatient Prescriptions on File Prior to Visit  Medication Sig Dispense Refill  . amLODipine (NORVASC) 5 MG tablet Take 5 mg by mouth daily.        Marland Kitchen azaTHIOprine (IMURAN) 50 MG tablet Take 100 mg by mouth daily.       . budesonide-formoterol (SYMBICORT) 160-4.5 MCG/ACT inhaler Inhale 2 puffs into the lungs 2 (two) times daily.  1 Inhaler  3  . colchicine 0.6 MG tablet Take 2 now and 1 in 1 hour.  May repeat dose once daily.  For gout attack.  12 tablet  0  . furosemide (LASIX) 40 MG tablet Take 1 tablet (40 mg total) by mouth daily.  30 tablet  2  . hydroxychloroquine (PLAQUENIL) 200 MG tablet TAKE 1 TABLET BY MOUTH TWICE A DAY  60 tablet  2  . lisinopril (PRINIVIL,ZESTRIL) 40 MG tablet Take 1 tablet (40 mg total) by mouth daily.  90 tablet  3  . metoprolol (LOPRESSOR) 100 MG tablet Take 1 tablet (100 mg total) by mouth 2 (two) times daily.  60 tablet  11  . Multiple Vitamin (MULTIVITAMIN) capsule Take 1 capsule by mouth daily.      . predniSONE (DELTASONE) 10 MG tablet Take 10 mg by mouth daily.        . indomethacin (INDOCIN) 25 MG capsule Take 1 capsule (25 mg total) by mouth 2 (two) times daily with a meal.  30 capsule  0    No Known Allergies  Past Medical History  Diagnosis Date  . Unspecified essential hypertension   . Unspecified deficiency  anemia   . Lupus erythematosus   . Systemic lupus erythematosus   . Secondary cardiomyopathy, unspecified   . ERYTHEMATOSUS, LUPUS 08/07/2006  . CHF (congestive heart failure) 5-6 yrs ago  hosp  . Chronic kidney disease     sees specialist [place]  f/u lupus    No past surgical history on file.  Family History  Problem Relation Age of Onset  . Lupus Mother   . Arthritis Maternal Grandmother     History   Social History  . Marital Status: Single    Spouse Name: N/A    Number of Children: N/A  . Years of Education: N/A   Occupational History  . retail     [employer]   Social History Main Topics  . Smoking status: Never Smoker   . Smokeless tobacco: Never Used  . Alcohol Use: Yes     socially  . Drug Use: No  . Sexually Active: No   Other Topics Concern  . Not on file   Social History Narrative  . No narrative on file   Review of Systems No recent travel No recent med changes No exposures to anyone with GI illness that she knows of Still does retail  at [employer] No cough  Some chronic chest pain and SOB over the years that is not changed    Objective:   Physical Exam  Constitutional: She appears well-developed and well-nourished. No distress.  Neck: Normal range of motion. Neck supple.  Pulmonary/Chest: Effort normal and breath sounds normal. No respiratory distress. She has no wheezes. She has no rales.  Abdominal: Soft. Bowel sounds are normal. She exhibits no distension and no mass. There is no rebound and no guarding.       Very mild generalized sensitivity with deep palpation  Musculoskeletal: She exhibits no edema.       No obvious active synovitis  Lymphadenopathy:    She has no cervical adenopathy.          Assessment & Plan:

## 2012-06-22 NOTE — Assessment & Plan Note (Addendum)
going on for 2 weeks and may be slightly worse---with low grade fever and decreased appetite now Could be C dif since she was treated with antibiotics before the onset of her diarrhea Infectious cause is possible also----not dysentery picture so would not try empiric Rx Could be pattern of IBS but unlikely to be her first symptoms now at her age Colitis can complicate lupus--may need colonoscopy if not infectious or C dif  Will have her call [facility] to get stool specimen containers for culture and C dif on Tuesday Discussed trying probiotic

## 2012-06-26 ENCOUNTER — Telehealth: Payer: Self-pay | Admitting: Internal Medicine

## 2012-06-26 ENCOUNTER — Ambulatory Visit: Payer: 59 | Admitting: Family

## 2012-06-26 NOTE — Telephone Encounter (Signed)
Caller: Devita/Patient; PCP: Shanon Ace (Family Practice); CB#: [phone]; Call regarding Patient calling to make lab appt. Caller reports she was seen in the office on Sat 8/31 and advised to call for Lab appt. RN reviewed office note from Dr Silvio Pate, "Please call [facility] on Tuesday to get specimen containers to send stool for culture and C difficile." Per note from MD, Caller advised she needs to come by the office and pick up stool specimen cups, collect stool and return to office. She is agreeable.

## 2012-07-01 ENCOUNTER — Other Ambulatory Visit: Payer: Self-pay | Admitting: Internal Medicine

## 2012-07-01 ENCOUNTER — Other Ambulatory Visit (INDEPENDENT_AMBULATORY_CARE_PROVIDER_SITE_OTHER): Payer: 59

## 2012-07-01 DIAGNOSIS — R197 Diarrhea, unspecified: Secondary | ICD-10-CM

## 2012-07-02 ENCOUNTER — Telehealth: Payer: Self-pay | Admitting: Internal Medicine

## 2012-07-02 NOTE — Telephone Encounter (Signed)
Pt requesting lab results done on yesterday.  Advised pt results are not back.  Patient would like a call back once lab results return.

## 2012-07-04 ENCOUNTER — Telehealth: Payer: Self-pay | Admitting: Internal Medicine

## 2012-07-04 NOTE — Telephone Encounter (Signed)
Pt dropped off hemoccult cards on 07-01-2012. requesting results

## 2012-07-05 NOTE — Telephone Encounter (Signed)
i don't see results

## 2012-07-08 ENCOUNTER — Ambulatory Visit (INDEPENDENT_AMBULATORY_CARE_PROVIDER_SITE_OTHER): Payer: 59 | Admitting: Internal Medicine

## 2012-07-08 ENCOUNTER — Other Ambulatory Visit: Payer: Self-pay | Admitting: Internal Medicine

## 2012-07-08 ENCOUNTER — Telehealth: Payer: Self-pay | Admitting: Internal Medicine

## 2012-07-08 ENCOUNTER — Encounter: Payer: Self-pay | Admitting: Internal Medicine

## 2012-07-08 VITALS — BP 140/100 | Temp 98.1°F | Wt 250.0 lb

## 2012-07-08 DIAGNOSIS — L930 Discoid lupus erythematosus: Secondary | ICD-10-CM

## 2012-07-08 DIAGNOSIS — R197 Diarrhea, unspecified: Secondary | ICD-10-CM

## 2012-07-08 DIAGNOSIS — M109 Gout, unspecified: Secondary | ICD-10-CM

## 2012-07-08 LAB — HEMOCCULT GUIAC POC 1CARD (OFFICE): Card #2 Fecal Occult Blod, POC: NEGATIVE

## 2012-07-08 NOTE — Telephone Encounter (Signed)
Sorry forgot to put results in that day. Results are in now.

## 2012-07-08 NOTE — Telephone Encounter (Signed)
Can you call lab and check on results

## 2012-07-08 NOTE — Telephone Encounter (Signed)
Caller: [name]/Patient; Patient Name: [name] [name]; PCP: Phoebe Sharps (Adults only); [facility] Phone Number: [phone] [name] is calling about Edema and pain in feet > L onset 07/04/12. She is out Colcrys that she takes prn for Gout. Pain increased on Friday (#8 1-10 scale) - Trouble walking. She had to work on Friday and Sat and rested legs on Sunday with feet elevated, Taking Ibuprofen 500mg s 1 PO prn not helping. Today feet still swollen and joints feel stiff. Walking somewhat improved. Triage per Foot Non- Injury Protocol and appnt advised within 24 hours for "new swelling of legs that does NOT resolve with rest and elevation of legs". Appointment schedued for today-07/08/12 at 1130 with Dr. Burnice Logan.

## 2012-07-08 NOTE — Telephone Encounter (Signed)
Agree with apointment- Call her and tell her never to take advil/aleve or any other NSAID--- can be hard on kidneys

## 2012-07-08 NOTE — Progress Notes (Signed)
Subjective:    Patient ID: [name] [name], female    DOB: 1981/01/16, 31 y.o.   MRN: [id number]  HPI  31 year old patient who has a history of systemic lupus complicated by nephritis and chronic kidney disease. For the past 4 weeks she has had some diarrhea which has improved. She has had early on as many as 10 watery stools per day and now occurs with a frequency of 4-5 times daily and much better formed. Her chief complaint today is painful left foot about primarily the left first MTP joint. She states that she has had 2 prior episodes of gouty arthritis. She has been using Indocin without much benefit and has used a Colchicine  in the past.  Past Medical History  Diagnosis Date  . Unspecified essential hypertension   . Unspecified deficiency anemia   . Lupus erythematosus   . Systemic lupus erythematosus   . Secondary cardiomyopathy, unspecified   . ERYTHEMATOSUS, LUPUS 08/07/2006  . CHF (congestive heart failure) 5-6 yrs ago  hosp  . Chronic kidney disease     sees specialist [place]  f/u lupus    History   Social History  . Marital Status: Single    Spouse Name: N/A    Number of Children: N/A  . Years of Education: N/A   Occupational History  . retail     [employer]   Social History Main Topics  . Smoking status: Never Smoker   . Smokeless tobacco: Never Used  . Alcohol Use: Yes     socially  . Drug Use: No  . Sexually Active: No   Other Topics Concern  . Not on file   Social History Narrative  . No narrative on file    No past surgical history on file.  Family History  Problem Relation Age of Onset  . Lupus Mother   . Arthritis Maternal Grandmother     No Known Allergies  Current Outpatient Prescriptions on File Prior to Visit  Medication Sig Dispense Refill  . amLODipine (NORVASC) 5 MG tablet Take 5 mg by mouth daily.        Marland Kitchen azaTHIOprine (IMURAN) 50 MG tablet Take 100 mg by mouth daily.       . budesonide-formoterol (SYMBICORT) 160-4.5 MCG/ACT  inhaler Inhale 2 puffs into the lungs 2 (two) times daily.  1 Inhaler  3  . colchicine 0.6 MG tablet Take 2 now and 1 in 1 hour.  May repeat dose once daily.  For gout attack.  12 tablet  0  . furosemide (LASIX) 40 MG tablet Take 1 tablet (40 mg total) by mouth daily.  30 tablet  2  . hydroxychloroquine (PLAQUENIL) 200 MG tablet TAKE 1 TABLET BY MOUTH TWICE A DAY  60 tablet  2  . indomethacin (INDOCIN) 25 MG capsule Take 1 capsule (25 mg total) by mouth 2 (two) times daily with a meal.  30 capsule  0  . lisinopril (PRINIVIL,ZESTRIL) 40 MG tablet Take 1 tablet (40 mg total) by mouth daily.  90 tablet  3  . metoprolol (LOPRESSOR) 100 MG tablet Take 1 tablet (100 mg total) by mouth 2 (two) times daily.  60 tablet  11  . Multiple Vitamin (MULTIVITAMIN) capsule Take 1 capsule by mouth daily.      . predniSONE (DELTASONE) 10 MG tablet Take 10 mg by mouth daily.          BP 140/100  Temp 98.1 F (36.7 C) (Oral)  Wt 250 lb (113.399 kg)  Review of Systems  Gastrointestinal: Positive for diarrhea.  Musculoskeletal: Positive for joint swelling.       Objective:   Physical Exam  Constitutional: She appears well-developed and well-nourished.  HENT:  Head: Normocephalic.  Eyes: Pupils are equal, round, and reactive to light.  Abdominal: Soft. Bowel sounds are normal. She exhibits no mass. There is no tenderness.  Musculoskeletal: Normal range of motion. She exhibits edema and tenderness.       Excessive warmth and soft tissue swelling and slight tenderness about the left first MTP joint The patient walks with a slight limp  Skin: No rash noted.  Psychiatric: She has a normal mood and affect. Her behavior is normal.          Assessment & Plan:   Resolving gouty arthritis. Patient is much improved today compared to yesterday. She is on daily prednisone 10 mg due to her systemic lupus. Will increase to a twice a day regimen for 5 days or less until her gouty arthritis if improved. Will  consider prophylactic treatment in the future. This apparently is her third episode of acute gout Diarrhea. Samples of align dispensed. This also has improved

## 2012-07-08 NOTE — Patient Instructions (Signed)
Align one daily Increase prednisone to 10 mg twice daily for 5 days for gouty arthritis  Call or return to clinic prn if these symptoms worsen or fail to improve as anticipated.

## 2012-07-09 ENCOUNTER — Other Ambulatory Visit (HOSPITAL_COMMUNITY): Payer: Self-pay | Admitting: Cardiology

## 2012-07-09 LAB — CLOSTRIDIUM DIFFICILE TOXIN

## 2012-07-09 NOTE — Telephone Encounter (Signed)
L/m on pts cell phone with instructions

## 2012-07-09 NOTE — Telephone Encounter (Addendum)
Pt is still having gout flare ups wants to know what to do next.  Seen Dr Raliegh Ip yesterday

## 2012-07-10 ENCOUNTER — Telehealth: Payer: Self-pay | Admitting: Internal Medicine

## 2012-07-10 MED ORDER — COLCHICINE 0.6 MG PO TABS: 0.6000 mg | ORAL_TABLET | Freq: Two times a day (BID) | ORAL | Status: DC

## 2012-07-10 NOTE — Telephone Encounter (Signed)
Okay a #20 0.6 mg to take twice a day when necessary gout

## 2012-07-10 NOTE — Telephone Encounter (Signed)
Dr Leanne Chang out of the office, seen Dr Raliegh Ip 9/17

## 2012-07-10 NOTE — Telephone Encounter (Signed)
Caller: Charl/Patient; Patient Name: [name] [name]; PCP: Phoebe Sharps (Adults only); [facility] Phone Number: [phone].  Reports seen 07/08/12 for gout flare up in L great toe and ankle.  Gout Onset:07/04/12.  On Prednisone daily since 07/04/12.  Reports left message for MD 07/09/12 requesting other medication options since was having severe pain in toe that made it very painful to walk.  Today, 07/10/12, toe is less painful and less swollen. Not taking Colchine because it does not help. 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 Advised to see MD within 24 hours for new onset of moderate pain that has not improved with 24 hours of home care per Foot Non-Injury guideline. Declined appointment due to just seen; requests MD response about other medications. Information noted and sent to [facility] for call back ASAP.

## 2012-07-10 NOTE — Telephone Encounter (Signed)
Caller: Persis/Patient; Patient Name: [name] [name]; PCP: Phoebe Sharps (Adults only); [place] Phone Number: [phone]; Follow up from call at 0830 on 9-18.  Patient would like to add that she has good success with taking Colcrys and Gout like symptoms.  Patient not interested in taking Prednisone if possible. Patient would like script for Colcrys called into to [place], [place], [phone].  Call already triaged.

## 2012-07-10 NOTE — Telephone Encounter (Signed)
rx sent in electronically, pt aware 

## 2012-07-11 ENCOUNTER — Other Ambulatory Visit (HOSPITAL_COMMUNITY): Payer: Self-pay | Admitting: Cardiology

## 2012-07-17 ENCOUNTER — Other Ambulatory Visit: Payer: Self-pay | Admitting: Cardiology

## 2012-07-30 ENCOUNTER — Encounter: Payer: Self-pay | Admitting: Gastroenterology

## 2012-08-14 ENCOUNTER — Encounter: Payer: Self-pay | Admitting: Internal Medicine

## 2012-08-14 ENCOUNTER — Ambulatory Visit (INDEPENDENT_AMBULATORY_CARE_PROVIDER_SITE_OTHER): Payer: 59 | Admitting: Internal Medicine

## 2012-08-14 VITALS — BP 138/96 | Temp 98.6°F | Wt 248.0 lb

## 2012-08-14 DIAGNOSIS — Z23 Encounter for immunization: Secondary | ICD-10-CM

## 2012-08-14 DIAGNOSIS — M109 Gout, unspecified: Secondary | ICD-10-CM | POA: Insufficient documentation

## 2012-08-14 MED ORDER — COLCHICINE 0.6 MG PO TABS: 0.6000 mg | ORAL_TABLET | Freq: Two times a day (BID) | ORAL | Status: DC

## 2012-08-14 NOTE — Progress Notes (Signed)
  Subjective:    Patient ID: [name] [name], female    DOB: 1981-10-21, 31 y.o.   MRN: [id number]  HPI Recurrent gout--  Sore right great toe Red painful Reviewed previous notes   Review of Systems  patient denies chest pain, shortness of breath, orthopnea. Denies lower extremity edema, abdominal pain, change in appetite, change in bowel movements. Patient denies rashes, musculoskeletal complaints. No other specific complaints in a complete review of systems.      Objective:   Physical Exam  Appears well Swollen right MTP joint, tender to touch      Assessment & Plan:

## 2012-08-14 NOTE — Assessment & Plan Note (Addendum)
Treat acutely and then use long term medication (if approved by dr Patrecia Pour)- ?can imuran dose be decreased to accommodate allopurinol  Reviewed contraindications for imuran/allopurinol Imuran/uloric

## 2012-08-14 NOTE — Patient Instructions (Addendum)
Start colchicine- i'm goin gto talk with dr devashwar about use of allopurinol for gout

## 2012-08-15 ENCOUNTER — Encounter: Payer: Self-pay | Admitting: *Deleted

## 2012-08-19 ENCOUNTER — Telehealth: Payer: Self-pay | Admitting: Internal Medicine

## 2012-08-19 NOTE — Telephone Encounter (Signed)
Caller: [name]/Patient; Patient Name: [name] [name]; PCP: Phoebe Sharps (Adults only); [place] Phone Number: [phone]; Is calling about gout medications.  Current flair up is in right great toe.  Rates pain of toe as a 7/10 when walking but it calms down when off.  Is unable to wear shoes. Swollen in joint where there is a big knot that is swollen about quarter sized.  Was seen by provider on 08/14/12 for gout with Cholchicine called in but has not really helped.  Is calling today to see if Dr. Leanne Chang has spoken to her Dr. Patrecia Pour about adjusting medications and other medication to treat gout.  Triaged using Foot Non-Injury with a disposition to be seen within 24 hours due to sudden onset of warmth, swelling, pain and extreme tenderness of affected joint.  Patient is wanting to know if Dr. Leanne Chang has spoke with Dr. [place]ble [place] about what else can be done.    OFFICE:  PLEASE FOLLOW UP WITH PATIENT REGARDING SECOND MEDICATION TO BE CALLED IN BY DR. SWORDS AFTER HE SPOKE WITH DR. Syble Creek REGARDING FURTHER GOUT TREATMENT.  GOUT IS STILL FLARED AT THIS TIME. THANKS

## 2012-08-20 ENCOUNTER — Encounter: Payer: Self-pay | Admitting: Gastroenterology

## 2012-08-20 ENCOUNTER — Ambulatory Visit (INDEPENDENT_AMBULATORY_CARE_PROVIDER_SITE_OTHER): Payer: 59 | Admitting: Gastroenterology

## 2012-08-20 VITALS — BP 122/96 | HR 64 | Ht 68.0 in | Wt 244.0 lb

## 2012-08-20 DIAGNOSIS — D899 Disorder involving the immune mechanism, unspecified: Secondary | ICD-10-CM

## 2012-08-20 DIAGNOSIS — M329 Systemic lupus erythematosus, unspecified: Secondary | ICD-10-CM

## 2012-08-20 DIAGNOSIS — A0472 Enterocolitis due to Clostridium difficile, not specified as recurrent: Secondary | ICD-10-CM

## 2012-08-20 MED ORDER — ALIGN PO CAPS: 1.0000 | ORAL_CAPSULE | Freq: Every day | ORAL | Status: DC

## 2012-08-20 MED ORDER — METRONIDAZOLE 250 MG PO TABS: 250.0000 mg | ORAL_TABLET | Freq: Three times a day (TID) | ORAL | Status: DC

## 2012-08-20 NOTE — Telephone Encounter (Signed)
Pt called and said that she had lab done and did not get results. Pt went to a GI doctor and was told that she has C Diff. Pt req call back from nurse asap.

## 2012-08-20 NOTE — Patient Instructions (Addendum)
We have sent the following medications to your pharmacy for you to pick up at your convenience: Flagyl to take three times a day for 2 weeks.  We have given you samples of Align. This puts good bacteria back into your colon. You should take 1 capsule by mouth once daily. If this works well for you, it can be purchased over the counter.  Please schedule a follow up visit with Dr. Sharlett Iles for 2 weeks.   Clostridium Difficile Infection Clostridium difficile (C. diff) is a bacteria found in the intestinal tract or colon. Under certain conditions, it causes diarrhea and sometimes severe disease. The severe form of the disease is known as pseudomembranous colitis (often called C. diff colitis). This disease can damage the lining of the colon or cause the colon to become enlarged (toxic megacolon).  CAUSES  Your colon normally contains many different bacteria, including C. diff. The balance of bacteria in your colon can change during illness. This is especially true when you take antibiotic medicine. Taking antibiotics may allow the C. diff to grow, multiply excessively, and make a toxin that then causes illness. The elderly and people with certain medical conditions have a greater risk of getting C. diff infections. SYMPTOMS   Watery diarrhea.  Fever.  Fatigue.  Loss of appetite.  Nausea.  Abdominal swelling, pain, or tenderness.  Dehydration. DIAGNOSIS  Your symptoms may make your caregiver suspicious of a C. diff infection, especially if you have used antibiotics in the preceding weeks. However, there are only 2 ways to know for certain whether you have a C. diff infection:  A lab test that finds the toxin in your stool.  The specific appearance of an abnormality (pseudomembrane) in your colon. This can only be seen by doing a sigmoidoscopy or colonoscopy. These procedures involve passing an instrument through your rectum to look at the inside of your colon. Your caregiver will help  determine if these tests are necessary. TREATMENT   Most people are successfully treated with 1 of 2 specific antibiotics, usually given by mouth. Other antibiotics you are receiving are stopped if possible.  Intravenous (IV) fluids and correction of electrolyte imbalance may be necessary.  Rarely, surgery may be needed to remove the infected part of the intestines.  Careful hand washing by you and your caregivers is important to prevent the spread of infection. In the hospital, your caregivers may also put on gowns and gloves to prevent the spread of the C. diff bacteria. Your room is also cleaned regularly with a hospital grade disinfectant. HOME CARE INSTRUCTIONS  Drink enough fluids to keep your urine clear or pale yellow. Avoid milk, caffeine, and alcohol.  Ask your caregiver for specific rehydration instructions.  Try eating small, frequent meals rather than large meals.  Take your antibiotics as directed. Finish them even if you start to feel better.  Do not use medicines to slow diarrhea. This could delay healing or cause complications.  Wash your hands thoroughly after using the bathroom and before preparing food.  Make sure people who live with you wash their hands often, too. SEEK MEDICAL CARE IF:  Diarrhea persists longer than expected or recurs after completing your course of antibiotic treatment for the C. diff infection.  You have trouble staying hydrated. SEEK IMMEDIATE MEDICAL CARE IF:  You develop a new fever.  You have increasing abdominal pain or tenderness.  There is blood in your stools, or your stools are dark black and tarry.  You cannot hold down  food or liquids. MAKE SURE YOU:   Understand these instructions.  Will watch your condition.  Will get help right away if you are not doing well or get worse. Document Released: 07/19/2005 Document Revised: 01/01/2012 Document Reviewed: 03/17/2011 [facility] Patient Information 2013 [place].

## 2012-08-20 NOTE — Progress Notes (Signed)
History of Present Illness:  This is a friend nice 31 year old African American female with lupus nephritis and chronic renal insufficiency. She's had loose stools with occasional heme for the last 2 months. She was on clindamycin 6 months ago for dental work. Patient is chronically immunosuppressed on Imuran 100 mg a day, also prednisone varying doses, currently 10 mg a day. Her usual pattern is one bowel movement a day, and she currently have 5-6 bowel movements a day with occasional nocturnal diarrhea. There is no associated abdominal pain, upper GI, hepatobiliary, or other systemic complaints. She does suffer from gouty arthritis and hypertension. Review of her labs shows a positive C. difficile toxin on September 18.  I have reviewed this patient's present history, medical and surgical past history, allergies and medications.     ROS: The remainder of the 10 point ROS is negative     Physical Exam: Blood pressure 120/96, pulse 64 and regular, and weight 244 pounds with BMI 37.10. General well developed well nourished patient in no acute distress, appearing their stated age Eyes PERRLA, no icterus, fundoscopic exam per opthamologist Skin no lesions noted Neck supple, no adenopathy, no thyroid enlargement, no tenderness Chest clear to percussion and auscultation Heart no significant murmurs, gallops or rubs noted Abdomen no hepatosplenomegaly masses or tenderness, BS normal.  Extremities no acute joint lesions, edema, phlebitis or evidence of cellulitis. Psychological mental status normal and normal affect.  Assessment and plan: Mild C. difficile colitis related to immunosuppression and previous clindamycin use. I placed on metronidazole 250 mg a day for 2 weeks with office followup at that time. We also have given her oral probiotics to use. She returns for followup if she is doing well we will go to alternate day therapy for 10-14 days. She is to continue other medications as listed and  reviewed per primary care.  Please copy her primary care physician, referring physician, and pertinent subspecialists.  No diagnosis found.

## 2012-08-22 MED ORDER — PREDNISONE 20 MG PO TABS: 20.0000 mg | ORAL_TABLET | Freq: Every day | ORAL | Status: DC

## 2012-08-22 MED ORDER — ALLOPURINOL 300 MG PO TABS: 300.0000 mg | ORAL_TABLET | Freq: Every day | ORAL | Status: DC

## 2012-08-22 NOTE — Telephone Encounter (Signed)
I have spoken with dr. Patrecia Pour. Please confirm with the patient that she is no longer taking IMURAN-- dr. Patrecia Pour says she has not refilled it in 3 months.  If she is nottaking imuran she should start allopurinol 300 mg po qd to prevent future gout attacks. When she starts allopurinol she should take prednisone 20 mg po qd for 5 days (#5/0 refills)   Update the medication list accordingly

## 2012-08-22 NOTE — Telephone Encounter (Signed)
rx sent in electronically, pt aware, med list updated

## 2012-08-30 ENCOUNTER — Encounter: Payer: Self-pay | Admitting: *Deleted

## 2012-09-02 ENCOUNTER — Encounter: Payer: Self-pay | Admitting: Internal Medicine

## 2012-09-05 ENCOUNTER — Encounter: Payer: Self-pay | Admitting: Gastroenterology

## 2012-09-05 ENCOUNTER — Ambulatory Visit (INDEPENDENT_AMBULATORY_CARE_PROVIDER_SITE_OTHER): Payer: 59 | Admitting: Gastroenterology

## 2012-09-05 VITALS — BP 132/88 | HR 67 | Temp 98.9°F | Wt 247.0 lb

## 2012-09-05 DIAGNOSIS — A0472 Enterocolitis due to Clostridium difficile, not specified as recurrent: Secondary | ICD-10-CM

## 2012-09-05 DIAGNOSIS — N189 Chronic kidney disease, unspecified: Secondary | ICD-10-CM

## 2012-09-05 DIAGNOSIS — M329 Systemic lupus erythematosus, unspecified: Secondary | ICD-10-CM

## 2012-09-05 MED ORDER — VANCOMYCIN HCL 125 MG PO CAPS: ORAL_CAPSULE | ORAL | Status: DC

## 2012-09-05 MED ORDER — CHOLESTYRAMINE 4 GM/DOSE PO POWD: 4.0000 g | ORAL | Status: DC | PRN

## 2012-09-05 NOTE — Patient Instructions (Addendum)
We have sent the following medications to your pharmacy for you to pick up at your convenience: Vancomycin AND Questran. Please take as directed.  Please stop Flagyl and start Vancomycin  Please make follow up appointment with Dr. Sharlett Iles in two weeks

## 2012-09-05 NOTE — Progress Notes (Signed)
History of Present Illness:  This is a 31 year old African American female with lupus nephritis hold up is on, and previously on Imuran, but I cannot see that medications listed with her drugs today.  She is stool that was positive for C. difficile, and she took metronidazole for 2 weeks without improvement.  She continues with 5-6 loose bowel movements a day, chronic fatigue, but denies significant abdominal pain, fever, chills, rectal bleeding.  No sick family members at home, she denies upper GI or hepatobiliary complaints.  I have reviewed this patient's present history, medical and surgical past history, allergies and medications.     ROS: The remainder of the 10 point ROS is negative     Physical Exam: Blood pressure 132/80, pulse 67 and regular, weight 247 pounds and oxygen saturation 95%. General well developed well nourished patient in no acute distress, appearing their stated age Eyes PERRLA, no icterus, fundoscopic exam per opthamologist Skin no lesions noted Neck supple, no adenopathy, no thyroid enlargement, no tendernes Abdomen no hepatosplenomegaly masses or tenderness, BS normal.  Rectal inspection normal no fissures, or fistulae noted.  No masses or tenderness on digital exam. Stool guaiac negative. Extremities no acute joint lesions, edema, phlebitis or evidence of cellulitis. Psychological mental status normal and normal affect.  Assessment and plan: C. difficile diarrhea refractory to first-line therapy with metronidazole.  I have placed her on vancomycin 125  Mg 3 times a day for 2 weeks office followup at that time.  Also have prescribed when necessary Questran 4 g with juice at bedtime for her diarrhea.  If she is unimproved on this therapy, we will seek infectious disease referral.  I suspect her lack of responses and some weight connected to her immunosuppression.  We called the patient, and she is off of Imuran and allegedly on" medicine for gout."  Please copy her  primary care physician, referring physician, and pertinent subspecialists.

## 2012-09-09 ENCOUNTER — Telehealth: Payer: Self-pay | Admitting: Internal Medicine

## 2012-09-09 NOTE — Telephone Encounter (Signed)
Try cochicine 0.6 mg po tid for 3 days

## 2012-09-09 NOTE — Telephone Encounter (Signed)
Patient Information:  Caller Name: Nissa  Phone: [phone]  Patient: [name] [name], Buchter  Gender: Female  DOB: 1981-03-23  Age: 31 Years  PCP: Phoebe Sharps (Adults only)  Pregnant: No   Symptoms  Reason For Call & Symptoms: pt was prescribed Allopurinol for gout 3 weeks ago. This w/e she had a flare up to where she has to take her shoe off by noon and it gets more painful over the course of the day. Pt has a knot on her big toe. Pt did start abx for c-diff prescribed by her G.I. MD 2 days ago. Is that interfering or making sx worsen?  Reviewed Health History In EMR: Yes  Reviewed Medications In EMR: Yes  Reviewed Allergies In EMR: Yes  Date of Onset of Symptoms: 09/07/2012  Treatments Tried: Allopurinol  Treatments Tried Worked: No OB:  LMP: 09/08/2012  Guideline(s) Used:  Foot Pain  Disposition Per Guideline:   See Within 3 Days in Office  Reason For Disposition Reached:   Moderate pain (e.g., interferes with normal activities, limping) and present > 3 days  Advice Given:  N/A  Office Follow Up:  Does the office need to follow up with this patient?: Yes  Instructions For The Office: Office please call pt  Patient Refused Recommendation:  Patient Requests Prescription  pt wants other medication recommendation  RN Note:  pt does not want to come back in if not necessary. Please advise if there is anything else she can do for the gout other than Allopurinol

## 2012-09-10 ENCOUNTER — Other Ambulatory Visit: Payer: Self-pay | Admitting: Internal Medicine

## 2012-09-10 NOTE — Telephone Encounter (Signed)
Pt aware.

## 2012-09-10 NOTE — Telephone Encounter (Signed)
Left message for pt to call back  °

## 2012-09-11 ENCOUNTER — Other Ambulatory Visit: Payer: Self-pay | Admitting: Internal Medicine

## 2012-09-11 MED ORDER — COLCHICINE 0.6 MG PO TABS: 0.6000 mg | ORAL_TABLET | Freq: Three times a day (TID) | ORAL | Status: DC

## 2012-09-11 NOTE — Telephone Encounter (Signed)
rx sent in electronically, pt aware 

## 2012-09-11 NOTE — Telephone Encounter (Addendum)
Pt needs new rx cochicine 0.6mg  po tid 3 days [facility] [address] [employer] [id number]

## 2012-09-17 ENCOUNTER — Ambulatory Visit: Payer: 59 | Admitting: Gastroenterology

## 2012-09-27 ENCOUNTER — Telehealth: Payer: Self-pay | Admitting: Internal Medicine

## 2012-09-27 NOTE — Telephone Encounter (Signed)
Is this ok?

## 2012-09-27 NOTE — Telephone Encounter (Signed)
Pt called and said that she needs to get a handicap placard. Pls call when ready for pick up.

## 2012-09-28 NOTE — Telephone Encounter (Signed)
Ok to complelte-- has lupus

## 2012-09-30 ENCOUNTER — Ambulatory Visit: Payer: 59 | Admitting: Internal Medicine

## 2012-09-30 NOTE — Telephone Encounter (Signed)
Up front ready for p/u, pt aware 

## 2012-10-01 ENCOUNTER — Encounter: Payer: Self-pay | Admitting: Internal Medicine

## 2012-10-24 ENCOUNTER — Telehealth: Payer: Self-pay | Admitting: Internal Medicine

## 2012-10-24 MED ORDER — COLCHICINE 0.6 MG PO TABS: 0.6000 mg | ORAL_TABLET | Freq: Two times a day (BID) | ORAL | Status: DC

## 2012-10-24 NOTE — Telephone Encounter (Signed)
Per Dr Arnoldo Morale stop the allopurinol and call in colchicine 0.6 mg bid x2 wks.  Pt aware, rx sent in electronically

## 2012-10-24 NOTE — Telephone Encounter (Signed)
Patient calling, on daily Allopurinal but has a flair up of gout in her left great toe. Same started on 10/23/12.  Asking if there is additional medication that she should take or just continue with her maintenance medication? [facility]

## 2012-11-25 ENCOUNTER — Other Ambulatory Visit: Payer: Self-pay | Admitting: Internal Medicine

## 2012-11-29 ENCOUNTER — Ambulatory Visit (INDEPENDENT_AMBULATORY_CARE_PROVIDER_SITE_OTHER): Payer: BC Managed Care – PPO | Admitting: Family Medicine

## 2012-11-29 ENCOUNTER — Encounter: Payer: Self-pay | Admitting: Family Medicine

## 2012-11-29 ENCOUNTER — Telehealth: Payer: Self-pay | Admitting: Internal Medicine

## 2012-11-29 VITALS — BP 150/90 | Temp 99.1°F | Wt 258.0 lb

## 2012-11-29 DIAGNOSIS — I10 Essential (primary) hypertension: Secondary | ICD-10-CM

## 2012-11-29 DIAGNOSIS — G43909 Migraine, unspecified, not intractable, without status migrainosus: Secondary | ICD-10-CM

## 2012-11-29 DIAGNOSIS — M109 Gout, unspecified: Secondary | ICD-10-CM

## 2012-11-29 DIAGNOSIS — K529 Noninfective gastroenteritis and colitis, unspecified: Secondary | ICD-10-CM

## 2012-11-29 DIAGNOSIS — K5289 Other specified noninfective gastroenteritis and colitis: Secondary | ICD-10-CM

## 2012-11-29 MED ORDER — SUMATRIPTAN SUCCINATE 50 MG PO TABS: 50.0000 mg | ORAL_TABLET | ORAL | Status: DC | PRN

## 2012-11-29 MED ORDER — ALLOPURINOL 300 MG PO TABS: 300.0000 mg | ORAL_TABLET | Freq: Every day | ORAL | Status: DC

## 2012-11-29 NOTE — Addendum Note (Signed)
Addended by: Colleen Can on: 11/29/2012 04:36 PM   Modules accepted: Orders

## 2012-11-29 NOTE — Telephone Encounter (Signed)
Patient Information:  Caller Name: Junko  Phone: [phone]  Patient: [name] [name], [name] [name]  Gender: Female  DOB: 04/06/1981  Age: 32 Years  PCP: Phoebe Sharps (Adults only)  Pregnant: No  Office Follow Up:  Does the office need to follow up with this patient?: No  Instructions For The Office: N/A   Symptoms  Reason For Call & Symptoms: Patient calling with h/a and dizziness.  Has had same since 1/22.  Also having nausea.  The h/a pain is not consistent in one area.  Has had intermittent eye pain but none today. The Motrin Migraine will relieve the pain but then the pain returns.  Denies any injury.  Reviewed Health History In EMR: Yes  Reviewed Medications In EMR: Yes  Reviewed Allergies In EMR: Yes  Reviewed Surgeries / Procedures: Yes  Date of Onset of Symptoms: 11/13/2012  Treatments Tried: Motrin Migraine  Treatments Tried Worked: Yes OB / GYN:  LMP: 11/29/2012  Guideline(s) Used:  Headache  Disposition Per Guideline:   See Today in Office  Reason For Disposition Reached:   New headache and weak immune system (e.g., HIV positive, cancer chemotherapy, chronic steroid treatment)  Advice Given:  N/A  Appointment Scheduled:  11/29/2012 15:00:00 Appointment Scheduled Provider:  Colin Benton (Family Practice)

## 2012-11-29 NOTE — Patient Instructions (Addendum)
-  take the metoprolol twice daily  -drink plenty of water  -try the new headache medicine if you start to get a migraine - don't use more then 1-2 times per week  -follow up with your doctor in 1 moth

## 2012-11-29 NOTE — Progress Notes (Signed)
Chief Complaint  Patient presents with  . Headache    vertigo    HPI:  Acute visit for Headaches: -has had headaches more frequently over last 2 weeks, comes and goes, none today and actually feeling better the last few days -symptoms: BP has been up, felt dizzy and had nausea about 1 week ago with a headache, has had this in the past with her headaches, she was worried though as BP has bee elevated at home 150/100, also had some naasea, vomiting and diarrhea that started about a week ago - this has resolved -denies: fevers, chills, vision changes, weakness -hx of: migraines, but this migraine has lasted longer, her usual migraines are one sided or both sides and are accompanied by nausea and sometimes dizziness  -in the past take otc advil migraine for her headaches -pt has only been taking her metoprolol once daily   ROS: See pertinent positives and negatives per HPI.  Past Medical History  Diagnosis Date  . Unspecified essential hypertension   . Unspecified deficiency anemia   . Systemic lupus erythematosus   . Secondary cardiomyopathy, unspecified   . ERYTHEMATOSUS, LUPUS 08/07/2006  . CHF (congestive heart failure) 5-6 yrs ago  hosp  . Chronic kidney disease     sees specialist [place]  f/u lupus  . Intestinal infection due to Clostridium difficile     Family History  Problem Relation Age of Onset  . Lupus Mother   . Arthritis Maternal Grandmother   . Prostate cancer Paternal Grandfather   . Diabetes Cousin     History   Social History  . Marital Status: Single    Spouse Name: N/A    Number of Children: 0  . 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 Smoking status: Never Smoker   . Smokeless tobacco: Never Used  . Alcohol Use: Yes     Comment: socially  . Drug Use: No  . Sexually Active: No   Other Topics Concern  . None   Social History Narrative  . None    Current outpatient  prescriptions:allopurinol (ZYLOPRIM) 300 MG tablet, Take 1 tablet (300 mg total) by mouth daily., Disp: 30 tablet, Rfl: 6;  amLODipine (NORVASC) 5 MG tablet, Take 5 mg by mouth daily.  , Disp: , Rfl: ;  budesonide-formoterol (SYMBICORT) 160-4.5 MCG/ACT inhaler, Inhale 2 puffs into the lungs 2 (two) times daily., Disp: 1 Inhaler, Rfl: 3 hydroxychloroquine (PLAQUENIL) 200 MG tablet, TAKE 1 TABLET BY MOUTH TWICE A DAY, Disp: 60 tablet, Rfl: 2;  lisinopril (PRINIVIL,ZESTRIL) 40 MG tablet, Take 1 tablet (40 mg total) by mouth daily., Disp: 90 tablet, Rfl: 3;  metoprolol (LOPRESSOR) 100 MG tablet, Take 1 tablet (100 mg total) by mouth 2 (two) times daily., Disp: 60 tablet, Rfl: 11;  Multiple Vitamin (MULTIVITAMIN) capsule, Take 1 capsule by mouth daily., Disp: , Rfl:  predniSONE (DELTASONE) 20 MG tablet, Take 1 tablet (20 mg total) by mouth daily., Disp: 5 tablet, Rfl: 0;  bifidobacterium infantis (ALIGN) capsule, Take 1 capsule by mouth daily., Disp: 14 capsule, Rfl: 0;  cholestyramine (QUESTRAN) 4 GM/DOSE powder, Take 1 packet (4 g total) by mouth as needed., Disp: 378 g, Rfl: 12;  colchicine 0.6 MG tablet, Take 1 tablet (0.6 mg total) by mouth 2 (two) times daily., Disp: 28 tablet, Rfl: 0 furosemide (LASIX) 40 MG tablet, TAKE 1 TABLET BY MOUTH EVERY  DAY, Disp: 30 tablet, Rfl: 0  EXAM:  Filed Vitals:   11/29/12 1518  BP: 150/90  Temp: 99.1 F (37.3 C)    There is no height on file to calculate BMI.  GENERAL: vitals reviewed and listed above, alert, oriented, appears well hydrated and in no acute distress  HEENT: atraumatic, conjunttiva clear, no obvious abnormalities on inspection of external nose and ears  NECK: no obvious masses on inspection  LUNGS: clear to auscultation bilaterally, no wheezes, rales or rhonchi, good air movement  CV: HRRR, no peripheral edema  ABD: soft, BS+, NTTP  MS: moves all extremities without noticeable abnormality  NEURO: PERRLA, CN II-XII grossly intact, finger  to nose normal, normal gait  PSYCH: pleasant and cooperative, no obvious depression or anxiety  ASSESSMENT AND PLAN:  Discussed the following assessment and plan:  1. Migraine   2. Unspecified essential hypertension   3. Gastroenteritis    -migraines similar to the past - a little more frequent, likely triggered by recent gastroenteritis and possible elevated BP, discussed options - no headache today and normal neuro exam. Will do trial of sumatriptan - discussed risks. -for recent gastroenteritis, symptoms have resolved, advised plenty of fluids -for HTN - advised taking prescribed dose of her metoprolol and continuing other medications -refilled her allopurinol -she is to follow up with her doctor in 1 month  -Patient advised to return or notify a doctor immediately if symptoms worsen or persist or new concerns arise.  There are no Patient Instructions on file for this visit.   Colin Benton R.

## 2013-02-01 ENCOUNTER — Ambulatory Visit: Payer: BC Managed Care – PPO | Admitting: Emergency Medicine

## 2013-02-01 VITALS — BP 100/70 | HR 96 | Temp 98.8°F | Resp 16 | Ht 67.0 in | Wt 250.0 lb

## 2013-02-01 DIAGNOSIS — M109 Gout, unspecified: Secondary | ICD-10-CM

## 2013-02-01 DIAGNOSIS — N289 Disorder of kidney and ureter, unspecified: Secondary | ICD-10-CM

## 2013-02-01 DIAGNOSIS — M329 Systemic lupus erythematosus, unspecified: Secondary | ICD-10-CM

## 2013-02-01 LAB — POCT CBC
Granulocyte percent: 73.5 %G (ref 37–80)
HCT, POC: 33.9 % — AB (ref 37.7–47.9)
Hemoglobin: 10.5 g/dL — AB (ref 12.2–16.2)
Lymph, poc: 1 (ref 0.6–3.4)
MCH, POC: 28.2 pg (ref 27–31.2)
MCHC: 31 g/dL — AB (ref 31.8–35.4)
MCV: 90.8 fL (ref 80–97)
MID (cbc): 0.5 (ref 0–0.9)
MPV: 9.1 fL (ref 0–99.8)
POC Granulocyte: 4.1 (ref 2–6.9)
POC LYMPH PERCENT: 17.1 %L (ref 10–50)
POC MID %: 9.4 %M (ref 0–12)
Platelet Count, POC: 252 10*3/uL (ref 142–424)
RBC: 3.73 M/uL — AB (ref 4.04–5.48)
RDW, POC: 15.1 %
WBC: 5.6 10*3/uL (ref 4.6–10.2)

## 2013-02-01 LAB — URIC ACID: Uric Acid, Serum: 6.8 mg/dL (ref 2.4–7.0)

## 2013-02-01 LAB — COMPREHENSIVE METABOLIC PANEL
Albumin: 3.3 g/dL — ABNORMAL LOW (ref 3.5–5.2)
CO2: 22 mEq/L (ref 19–32)
Calcium: 8.6 mg/dL (ref 8.4–10.5)
Glucose, Bld: 76 mg/dL (ref 70–99)
Potassium: 4.5 mEq/L (ref 3.5–5.3)
Sodium: 138 mEq/L (ref 135–145)
Total Protein: 6.3 g/dL (ref 6.0–8.3)

## 2013-02-01 LAB — POCT SEDIMENTATION RATE: POCT SED RATE: 48 mm/hr — AB (ref 0–22)

## 2013-02-01 MED ORDER — HYDROCODONE-ACETAMINOPHEN 5-325 MG PO TABS: 1.0000 | ORAL_TABLET | Freq: Four times a day (QID) | ORAL | Status: DC | PRN

## 2013-02-01 MED ORDER — PREDNISONE 10 MG PO TABS: ORAL_TABLET | ORAL | Status: DC

## 2013-02-01 NOTE — Patient Instructions (Addendum)
Gout Gout is an inflammatory condition (arthritis) caused by a buildup of uric acid crystals in the joints. Uric acid is a chemical that is normally present in the blood. Under some circumstances, uric acid can form into crystals in your joints. This causes joint redness, soreness, and swelling (inflammation). Repeat attacks are common. Over time, uric acid crystals can form into masses (tophi) near a joint, causing disfigurement. Gout is treatable and often preventable. CAUSES  The disease begins with elevated levels of uric acid in the blood. Uric acid is produced by your body when it breaks down a naturally found substance called purines. This also happens when you eat certain foods such as meats and fish. Causes of an elevated uric acid level include:  Being passed down from parent to child (heredity).  Diseases that cause increased uric acid production (obesity, psoriasis, some cancers).  Excessive alcohol use.  Diet, especially diets rich in meat and seafood.  Medicines, including certain cancer-fighting drugs (chemotherapy), diuretics, and aspirin.  Chronic kidney disease. The kidneys are no longer able to remove uric acid well.  Problems with metabolism. Conditions strongly associated with gout include:  Obesity.  High blood pressure.  High cholesterol.  Diabetes. Not everyone with elevated uric acid levels gets gout. It is not understood why some people get gout and others do not. Surgery, joint injury, and eating too much of certain foods are some of the factors that can lead to gout. SYMPTOMS   An attack of gout comes on quickly. It causes intense pain with redness, swelling, and warmth in a joint.  Fever can occur.  Often, only one joint is involved. Certain joints are more commonly involved:  Base of the big toe.  Knee.  Ankle.  Wrist.  Finger. Without treatment, an attack usually goes away in a few days to weeks. Between attacks, you usually will not have  symptoms, which is different from many other forms of arthritis. DIAGNOSIS  Your caregiver will suspect gout based on your symptoms and exam. Removal of fluid from the joint (arthrocentesis) is done to check for uric acid crystals. Your caregiver will give you a medicine that numbs the area (local anesthetic) and use a needle to remove joint fluid for exam. Gout is confirmed when uric acid crystals are seen in joint fluid, using a special microscope. Sometimes, blood, urine, and X-ray tests are also used. TREATMENT  There are 2 phases to gout treatment: treating the sudden onset (acute) attack and preventing attacks (prophylaxis). Treatment of an Acute Attack  Medicines are used. These include anti-inflammatory medicines or steroid medicines.  An injection of steroid medicine into the affected joint is sometimes necessary.  The painful joint is rested. Movement can worsen the arthritis.  You may use warm or cold treatments on painful joints, depending which works best for you.  Discuss the use of coffee, vitamin C, or cherries with your caregiver. These may be helpful treatment options. Treatment to Prevent Attacks After the acute attack subsides, your caregiver may advise prophylactic medicine. These medicines either help your kidneys eliminate uric acid from your body or decrease your uric acid production. You may need to stay on these medicines for a very long time. The early phase of treatment with prophylactic medicine can be associated with an increase in acute gout attacks. For this reason, during the first few months of treatment, your caregiver may also advise you to take medicines usually used for acute gout treatment. Be sure you understand your caregiver's directions.   You should also discuss dietary treatment with your caregiver. Certain foods such as meats and fish can increase uric acid levels. Other foods such as dairy can decrease levels. Your caregiver can give you a list of foods  to avoid. HOME CARE INSTRUCTIONS   Do not take aspirin to relieve pain. This raises uric acid levels.  Only take over-the-counter or prescription medicines for pain, discomfort, or fever as directed by your caregiver.  Rest the joint as much as possible. When in bed, keep sheets and blankets off painful areas.  Keep the affected joint raised (elevated).  Use crutches if the painful joint is in your leg.  Drink enough water and fluids to keep your urine clear or pale yellow. This helps your body get rid of uric acid. Do not drink alcoholic beverages. They slow the passage of uric acid.  Follow your caregiver's dietary instructions. Pay careful attention to the amount of protein you eat. Your daily diet should emphasize fruits, vegetables, whole grains, and fat-free or low-fat milk products.  Maintain a healthy body weight. SEEK MEDICAL CARE IF:   You have an oral temperature above 102 F (38.9 C).  You develop diarrhea, vomiting, or any side effects from medicines.  You do not feel better in 24 hours, or you are getting worse. SEEK IMMEDIATE MEDICAL CARE IF:   Your joint becomes suddenly more tender and you have:  Chills.  An oral temperature above 102 F (38.9 C), not controlled by medicine. MAKE SURE YOU:   Understand these instructions.  Will watch your condition.  Will get help right away if you are not doing well or get worse. Document Released: 10/06/2000 Document Revised: 01/01/2012 Document Reviewed: 01/17/2010 [facility] Patient Information 2013 [place].     Please call make an appointment to see her primary care doctor. Please call and make an appointment to see your kidney specialist. We will be making an appointment for you to see the rheumatologist

## 2013-02-01 NOTE — Progress Notes (Signed)
  Subjective:    Patient ID: [name] [name], female    DOB: 1981-05-06, 32 y.o.   MRN: [id number]  HPI patient states that yesterday she started having severe discomfort in her left ankle. This has progressed to where she has difficulty with walking. She has a history of gout. She has a history of lupus and has had cardiac issues as well as lupus nephritis. Her previous rheumatologist dismissed her from her practice because she would not keep her appointments. She currently has no rheumatologist to see her. She is known to have chronic renal disease. From what I can tell from the chart she has not had blood work done since last year    Review of Systems     Objective:   Physical Exam HEENT exam is unremarkable. Her chest was clear. Cardiac regular rate and rhythm with a loud S3- gallop.. Examination of the left ankle reveals slight increased warmth. There is pain with any movement of the ankle.  Results for orders placed in visit on 02/01/13  POCT CBC      Result Value Range   WBC 5.6  4.6 - 10.2 K/uL   Lymph, poc 1.0  0.6 - 3.4   POC LYMPH PERCENT 17.1  10 - 50 %L   MID (cbc) 0.5  0 - 0.9   POC MID % 9.4  0 - 12 %M   POC Granulocyte 4.1  2 - 6.9   Granulocyte percent 73.5  37 - 80 %G   RBC 3.73 (*) 4.04 - 5.48 M/uL   Hemoglobin 10.5 (*) 12.2 - 16.2 g/dL   HCT, POC 33.9 (*) 37.7 - 47.9 %   MCV 90.8  80 - 97 fL   MCH, POC 28.2  27 - 31.2 pg   MCHC 31.0 (*) 31.8 - 35.4 g/dL   RDW, POC 15.1     Platelet Count, POC 252  142 - 424 K/uL   MPV 9.1  0 - 99.8 fL        Assessment & Plan:  Patient most likely has an acute flare of her gout. This would be related to her renal disease from her lupus. Ankle and make a referral to Dr. Amil Amen or Ouida Sills per rheumatology. She also needs referral to nephrology because of her renal disease. She states she will make her own appointment to renal. We'll treat with prednisone as that would be the safest considering her lupus. Routine labs were  done today

## 2013-02-03 LAB — ANTI-NUCLEAR AB-TITER (ANA TITER): ANA Titer 1: >1:2560 {titer} — ABNORMAL HIGH

## 2013-02-03 LAB — ANA: Anti Nuclear Antibody(ANA): POSITIVE — AB

## 2013-02-26 ENCOUNTER — Ambulatory Visit: Payer: BC Managed Care – PPO | Admitting: Physician Assistant

## 2013-02-26 VITALS — BP 130/92 | HR 88 | Temp 97.9°F | Resp 16 | Ht 67.8 in | Wt 257.8 lb

## 2013-02-26 DIAGNOSIS — L600 Ingrowing nail: Secondary | ICD-10-CM

## 2013-02-26 DIAGNOSIS — M79675 Pain in left toe(s): Secondary | ICD-10-CM

## 2013-02-26 DIAGNOSIS — M79609 Pain in unspecified limb: Secondary | ICD-10-CM

## 2013-02-26 NOTE — Progress Notes (Signed)
  Subjective:    Patient ID: [name] [name], female    DOB: 22-Oct-1981, 32 y.o.   MRN: [id number]  HPI  This 32 y.o. female presents for evaluation of toe pain and ingrowing nail.  She's been trimming the medial corner of the LEFT great toenail for some time, but just developed pain yesterday.  Having had a similar issue on the lateral aspect of this same nail about a year ago, she elected to seek definitive treatment earlier this time.  Past medical history, surgical history, family history, social history and problem list reviewed.  Review of Systems No fever, chills, GI/GU symptoms.    Objective:   Physical Exam  Vitals reviewed. Constitutional: She is oriented to person, place, and time. She appears well-developed and well-nourished. No distress.  Eyes: Conjunctivae are normal.  Cardiovascular: Normal rate.   Pulmonary/Chest: Effort normal.  Neurological: She is alert and oriented to person, place, and time.  Skin: Skin is warm and dry. There is erythema (mild; medial nail fold of the LEFT great toe; Ingrowing nail.  Very tender.  No purulence.).   Verbal consent obtained. Digital block with 4 cc 2% lidocaine plain.  SP&D.  Medial nail lifted and removed.  Xeroform placed.  Cleansed and dressed.        Assessment & Plan:  Pain of great toe, left  Ingrown left greater toenail  Local wound care.  RTC PRN.  Counseled on ways to prevent recurrence.  Fara Chute, PA-C Physician Assistant-Certified [facility]

## 2013-02-26 NOTE — Patient Instructions (Signed)
INGROWN TOENAIL . Keep area clean, dry and bandaged for 24 hours. . After 24 hours, remove outer bandage and leave yellow gauze in place. . Soak toe/foot in warm soapy water for 5-10 minutes, once daily for 5 days. Rebandage toe after each cleaning. . Continue soaks until yellow gauze falls off. . Notify the office if you experience any of the following signs of infection: Swelling, redness, pus drainage, streaking, fever > 101.0 F   

## 2013-02-28 ENCOUNTER — Other Ambulatory Visit: Payer: Self-pay | Admitting: Emergency Medicine

## 2013-03-01 ENCOUNTER — Telehealth: Payer: Self-pay

## 2013-03-01 NOTE — Telephone Encounter (Signed)
PT CALLED TO SEE IF WE CAN CALL IN A REFILL FOR THE PREDNISONE THAT WE PRESCRIBED HER A FEW WEEKS AGO. PT CAN BE REACHED AT [phone].

## 2013-03-03 MED ORDER — COLCHICINE 0.6 MG PO TABS: 0.6000 mg | ORAL_TABLET | Freq: Two times a day (BID) | ORAL | Status: DC

## 2013-03-03 NOTE — Telephone Encounter (Signed)
Called patient, she states having a gout flare, she does not have colchicine Rx, advised this may be better than repeat of the prednisone, please advise. Also asked Referrals dept. To check on the appt with Rheumatology.

## 2013-03-03 NOTE — Telephone Encounter (Signed)
I can refill her colchicine but not her prednisone without an ov.  If this is her 2nd flair in a month she might want to RTC to discuss preventative options.

## 2013-03-04 NOTE — Telephone Encounter (Signed)
Called her to advise.  

## 2013-03-19 ENCOUNTER — Telehealth: Payer: Self-pay

## 2013-03-19 NOTE — Telephone Encounter (Signed)
Kelly from [facility] called to inform us that patient will have to be seen by a rheumatologist. Please call Claiborne Billings at [phone] ext.Geronimo

## 2013-03-19 NOTE — Telephone Encounter (Signed)
Can you check on this?

## 2013-04-04 ENCOUNTER — Other Ambulatory Visit: Payer: Self-pay | Admitting: Cardiology

## 2013-04-15 ENCOUNTER — Telehealth: Payer: Self-pay | Admitting: Cardiology

## 2013-04-15 NOTE — Telephone Encounter (Signed)
LMTCB

## 2013-04-15 NOTE — Telephone Encounter (Signed)
New problem    Pt is needing refills but was told she needs to schedule an appt-pt has not been assigned to another provider

## 2013-04-16 NOTE — Telephone Encounter (Signed)
lmtcb Debbie Ozzie Knobel RN  

## 2013-04-16 NOTE — Telephone Encounter (Signed)
Will forward to [employer]

## 2013-04-21 ENCOUNTER — Other Ambulatory Visit: Payer: Self-pay | Admitting: *Deleted

## 2013-04-21 MED ORDER — LISINOPRIL 40 MG PO TABS: ORAL_TABLET | ORAL | Status: DC

## 2013-05-25 ENCOUNTER — Ambulatory Visit: Payer: BC Managed Care – PPO | Admitting: Family Medicine

## 2013-05-25 VITALS — BP 102/76 | HR 100 | Temp 98.2°F | Resp 18 | Wt 259.0 lb

## 2013-05-25 DIAGNOSIS — IMO0002 Reserved for concepts with insufficient information to code with codable children: Secondary | ICD-10-CM

## 2013-05-25 DIAGNOSIS — I10 Essential (primary) hypertension: Secondary | ICD-10-CM

## 2013-05-25 DIAGNOSIS — M329 Systemic lupus erythematosus, unspecified: Secondary | ICD-10-CM

## 2013-05-25 DIAGNOSIS — T887XXA Unspecified adverse effect of drug or medicament, initial encounter: Secondary | ICD-10-CM

## 2013-05-25 DIAGNOSIS — T50905A Adverse effect of unspecified drugs, medicaments and biological substances, initial encounter: Secondary | ICD-10-CM

## 2013-05-25 DIAGNOSIS — R220 Localized swelling, mass and lump, head: Secondary | ICD-10-CM

## 2013-05-25 MED ORDER — EPINEPHRINE 0.3 MG/0.3ML IJ SOAJ: 0.3000 mg | Freq: Once | INTRAMUSCULAR | Status: DC

## 2013-05-25 MED ORDER — METHYLPREDNISOLONE ACETATE 80 MG/ML IJ SUSP
80.0000 mg | Freq: Once | INTRAMUSCULAR | Status: AC
  Administered 2013-05-25: 80 mg via INTRAMUSCULAR

## 2013-05-25 NOTE — Progress Notes (Signed)
[facility]:  Office Visit  Chief Complaint:  Chief Complaint  Patient presents with  . Oral Swelling    lip    HPI: [name] [name] is a 32 y.o. female who complains of  Lip swelling, started this AM, she has had this before and is on Kisnopril. No instigating factors. She has no new meds, no new lipsticks, no new foods, no new travesl. Thsi ahs happened 3-4 times, last episode was about 6 mos-1 year ago. She denies SOB, CP, palpitations. Has been lisinopril for a long time, she has had lupus for 14 years. She cannot take HCTZ or Lasix due to gout.   Has had Lupus, wants Rheum referral , was seen by [facility] and [facility] and does not want to go back to them. She would like a new referral.   Past Medical History  Diagnosis Date  . Unspecified essential hypertension   . Unspecified deficiency anemia   . Systemic lupus erythematosus   . Secondary cardiomyopathy, unspecified   . ERYTHEMATOSUS, LUPUS 08/07/2006  . CHF (congestive heart failure) 5-6 yrs ago  hosp  . Chronic kidney disease     sees specialist [place]  f/u lupus  . Intestinal infection due to Clostridium difficile    History reviewed. No pertinent past surgical history. History   Social History  . Marital Status: Single    Spouse Name: N/A    Number of Children: 0  . 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 Smoking status: Never Smoker   . Smokeless tobacco: Never Used  . Alcohol Use: Yes     Comment: socially  . Drug Use: No  . Sexually Active: No   Other Topics Concern  . None   Social History Narrative  . None   Family History  Problem Relation Age of Onset  . Lupus Mother   . Arthritis Maternal Grandmother   . Cancer Maternal Grandmother   . Prostate cancer Paternal Grandfather   . Cancer Paternal Grandfather   . Diabetes Cousin    Allergies  Allergen Reactions  . Nsaids     She should avoid all  due to renal insufficiency   Prior to Admission medications   Medication Sig Start Date End Date Taking? Authorizing Provider  allopurinol (ZYLOPRIM) 300 MG tablet Take 1 tablet (300 mg total) by mouth daily. 11/29/12  Yes Lucretia Kern, DO  amLODipine (NORVASC) 5 MG tablet Take 5 mg by mouth daily.     Yes Historical Provider, MD  hydroxychloroquine (PLAQUENIL) 200 MG tablet TAKE 1 TABLET BY MOUTH TWICE A DAY 11/25/12  Yes Bruce Lemmie Evens Swords, MD  lisinopril (PRINIVIL,ZESTRIL) 40 MG tablet TAKE 1 TABLET (40 MG TOTAL) BY MOUTH DAILY. 04/21/13  Yes Renella Cunas, MD  metoprolol (LOPRESSOR) 100 MG tablet Take 1 tablet (100 mg total) by mouth 2 (two) times daily. 11/23/11 03/31/16 Yes Renella Cunas, MD  Multiple Vitamin (MULTIVITAMIN) capsule Take 1 capsule by mouth daily.   Yes Historical Provider, MD  budesonide-formoterol (SYMBICORT) 160-4.5 MCG/ACT inhaler Inhale 2 puffs into the lungs 2 (two) times daily. 10/05/11 12/30/14  Brand Males, MD  cholestyramine Lucrezia Starch) 4 GM/DOSE powder Take 1 packet (4 g total) by mouth as needed. 09/05/12   Sable Feil, MD  colchicine 0.6 MG tablet Take 1 tablet (0.6 mg total) by mouth 2 (two) times daily.  03/03/13   Mancel Bale, PA-C  furosemide (LASIX) 40 MG tablet TAKE 1 TABLET BY MOUTH EVERY DAY 07/17/12   Renella Cunas, MD  HYDROcodone-acetaminophen (NORCO) 5-325 MG per tablet Take 1 tablet by mouth every 6 (six) hours as needed for pain. 02/01/13   Darlyne Russian, MD  predniSONE (DELTASONE) 10 MG tablet Take 4 a  day for 3 days 3 a day for 3 days 2 a day for 3 days one a day for 3 days 02/01/13   Darlyne Russian, MD  SUMAtriptan (IMITREX) 50 MG tablet Take 1 tablet (50 mg total) by mouth every 2 (two) hours as needed for migraine. No more then 2 tablets in 24 hours 11/29/12   Lucretia Kern, DO     ROS: The patient denies fevers, chills, night sweats, unintentional weight loss, chest pain, palpitations, wheezing, dyspnea on exertion, nausea, vomiting, abdominal pain,  dysuria, hematuria, melena, numbness, weakness, or tingling.   All other systems have been reviewed and were otherwise negative with the exception of those mentioned in the HPI and as above.    PHYSICAL EXAM: Filed Vitals:   05/25/13 0924  BP: 102/76  Pulse: 100  Temp: 98.2 F (36.8 C)  Resp: 18   Filed Vitals:   05/25/13 0924  Weight: 259 lb (117.482 kg)   Body mass index is 39.62 kg/(m^2).  General: Alert, no acute distress HEENT:  Normocephalic, atraumatic, oropharynx patent. NO exudates, no throat swelling. She does have swelling of the upper lip only. No voice changes.  Cardiovascular:  Regular rate and rhythm, no rubs murmurs or gallops.  No Carotid bruits, radial pulse intact. No pedal edema.  Respiratory: Clear to auscultation bilaterally.  No wheezes, rales, or rhonchi.  No cyanosis, no use of accessory musculature GI: No organomegaly, abdomen is soft and non-tender, positive bowel sounds.  No masses. Skin: No rashes. Neurologic: Facial musculature symmetric. Psychiatric: Patient is appropriate throughout our interaction. Lymphatic: No cervical lymphadenopathy Musculoskeletal: Gait intact.   LABS: Results for orders placed in visit on 02/01/13  COMPREHENSIVE METABOLIC PANEL      Result Value Range   Sodium 138  135 - 145 mEq/L   Potassium 4.5  3.5 - 5.3 mEq/L   Chloride 108  96 - 112 mEq/L   CO2 22  19 - 32 mEq/L   Glucose, Bld 76  70 - 99 mg/dL   BUN 26 (*) 6 - 23 mg/dL   Creat 1.39 (*) 0.50 - 1.10 mg/dL   Total Bilirubin 0.6  0.3 - 1.2 mg/dL   Alkaline Phosphatase 84  39 - 117 U/L   AST 20  0 - 37 U/L   ALT 17  0 - 35 U/L   Total Protein 6.3  6.0 - 8.3 g/dL   Albumin 3.3 (*) 3.5 - 5.2 g/dL   Calcium 8.6  8.4 - 10.5 mg/dL  URIC ACID      Result Value Range   Uric Acid, Serum 6.8  2.4 - 7.0 mg/dL  ANA      Result Value Range   ANA POS (*) NEGATIVE  ANTI-NUCLEAR AB-TITER (ANA TITER)      Result Value Range   ANA Titer 1 >1:2560 (*) <1:40     ANA  Pattern 1 SPECKLED (*)   POCT CBC      Result Value Range   WBC 5.6  4.6 - 10.2 K/uL   Lymph, poc 1.0  0.6 - 3.4   POC LYMPH PERCENT 17.1  10 - 50 %L   MID (cbc) 0.5  0 - 0.9   POC MID % 9.4  0 - 12 %M   POC Granulocyte 4.1  2 - 6.9   Granulocyte percent 73.5  37 - 80 %G   RBC 3.73 (*) 4.04 - 5.48 M/uL   Hemoglobin 10.5 (*) 12.2 - 16.2 g/dL   HCT, POC 33.9 (*) 37.7 - 47.9 %   MCV 90.8  80 - 97 fL   MCH, POC 28.2  27 - 31.2 pg   MCHC 31.0 (*) 31.8 - 35.4 g/dL   RDW, POC 15.1     Platelet Count, POC 252  142 - 424 K/uL   MPV 9.1  0 - 99.8 fL  POCT SEDIMENTATION RATE      Result Value Range   POCT SED RATE 48 (*) 0 - 22 mm/hr     EKG/XRAY:   Primary read interpreted by Dr. Marin Comment at [facility].   ASSESSMENT/PLAN: Encounter Diagnoses  Name Primary?  . Medication reaction, initial encounter Yes  . HTN (hypertension)   . Lupus   . Swelling of upper lip    She received Depomedrol 80 mg IM x 1  In office Precautions given about angioedema sxs with Lisinopril Discontinue Lisinopril, she already took one today We will increase her Norvasc to 10 mg daily She will take BP and return to office with BP logs in 2 weeks, BP goals discussed due to Lupus induced cardiomyopathy  I have given her an epi pen incase she has worsening sxs She declines going to ER.  Again precautions given to go to ER prn, pt understands    Eriyana Sweeten, [place], DO 05/25/2013 10:26 AM

## 2013-05-25 NOTE — Patient Instructions (Signed)
STOP LISINOPRIL INCREASE NORVASC TO 10 MG   Angioedema Angioedema (AE) is sudden puffiness (swelling) of the skin. It can happen to the face, genitals, and other body parts. You may be reacting to something you are sensitive to (allergic reaction). It may have been passed to you from your parents (hereditary), or it may develop by itself (acquired). You may also get red itchy patches of skin (hives). Attacks may be mild. Some attacks are life-threatening. Most often, the puffiness happens fast. It often gets better in 24 to 48 hours.  Hill your emergency allergy medicines with you.  Wear a medical bracelet.  Avoid things that you know will cause this reaction (triggers). GET HELP RIGHT AWAY IF:   You have trouble breathing.  You have trouble swallowing.  You pass out (faint).  You have another attack.  Your attacks happen more often or get worse.  AE was passed to you by your parents and you want to start having children. MAKE SURE YOU:   Understand these instructions.  Will watch your condition.  Will get help right away if you are not doing well or get worse. Document Released: 09/27/2009 Document Revised: 01/01/2012 Document Reviewed: 09/27/2009 [facility] Patient Information 2014 [place], [place].

## 2013-06-13 ENCOUNTER — Ambulatory Visit: Payer: BC Managed Care – PPO | Admitting: Cardiology

## 2013-07-26 ENCOUNTER — Other Ambulatory Visit: Payer: Self-pay | Admitting: Family Medicine

## 2013-10-20 ENCOUNTER — Other Ambulatory Visit: Payer: Self-pay | Admitting: Internal Medicine

## 2013-10-23 ENCOUNTER — Ambulatory Visit (INDEPENDENT_AMBULATORY_CARE_PROVIDER_SITE_OTHER): Payer: BC Managed Care – PPO | Admitting: Family Medicine

## 2013-10-23 VITALS — BP 116/84 | HR 88 | Temp 99.4°F | Resp 18 | Ht 67.0 in | Wt 264.0 lb

## 2013-10-23 DIAGNOSIS — B3731 Acute candidiasis of vulva and vagina: Secondary | ICD-10-CM

## 2013-10-23 DIAGNOSIS — L930 Discoid lupus erythematosus: Secondary | ICD-10-CM

## 2013-10-23 DIAGNOSIS — N899 Noninflammatory disorder of vagina, unspecified: Secondary | ICD-10-CM

## 2013-10-23 DIAGNOSIS — N898 Other specified noninflammatory disorders of vagina: Secondary | ICD-10-CM

## 2013-10-23 DIAGNOSIS — B373 Candidiasis of vulva and vagina: Secondary | ICD-10-CM

## 2013-10-23 LAB — POCT WET PREP WITH KOH
KOH Prep POC: NEGATIVE
TRICHOMONAS UA: NEGATIVE
YEAST WET PREP PER HPF POC: NEGATIVE

## 2013-10-23 MED ORDER — FLUCONAZOLE 100 MG PO TABS: ORAL_TABLET | ORAL | Status: DC

## 2013-10-23 MED ORDER — NYSTATIN 100000 UNIT/GM EX CREA: 1.0000 "application " | TOPICAL_CREAM | Freq: Two times a day (BID) | CUTANEOUS | Status: DC

## 2013-10-23 NOTE — Progress Notes (Signed)
Subjective:    Patient ID: [name] [name], female    DOB: 16-Nov-1980, 33 y.o.   MRN: [id number]  HPI Scribed for Reginia Forts MD, the patient was seen in room 14. This chart was scribed by Denice Bors, ED scribe. Patient's care was started at 12:02 PM  HPI Comments: Hx was provided by the pt.  [name] [name] is a 33 y.o. female who presents to the [facility] with PMHx of genital herpes simplex virus and lupus complaining of genital rash onset 2 weeks. Describes rash as pruritic. Reports associated external vaginal lesions and constant moderate pain to site. Reports symptoms are exacerbated when voiding. Reports trying hydrocortisone and Valtrex with no relief of symptoms. Denies associated dysuria, abdominal pain, known vaginal discharge, fever, vaginal bleeding, change in soaps, change in detergents, and change in lotions. Reports last sexual partner was in September 2014 (initial dx of HSV) and has not been sexually active since. Reports STD panel was completed since initial exposure, which was negative.  No new soaps, detergents, shampoos, medications.  Had recurrent herpes infection with onset of rash; herpes rash has resolved on R with persistent L sided labial itching and rash.    LMP 10/11/13   Past Medical History  Diagnosis Date  . Unspecified essential hypertension   . Unspecified deficiency anemia   . Systemic lupus erythematosus   . Secondary cardiomyopathy, unspecified   . ERYTHEMATOSUS, LUPUS 08/07/2006  . CHF (congestive heart failure) 5-6 yrs ago  hosp  . Chronic kidney disease     sees specialist [facility]  f/u lupus  . Intestinal infection due to Clostridium difficile     No past surgical history on file.  Family History  Problem Relation Age of Onset  . Lupus Mother   . Arthritis Maternal Grandmother   . Cancer Maternal Grandmother   . Prostate cancer Paternal Grandfather   . Cancer Paternal Grandfather   . Diabetes Cousin      History   Social History  . Marital Status: Single    Spouse Name: N/A    Number of Children: 0  . 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 Smoking status: Never Smoker   . Smokeless tobacco: Never Used  . Alcohol Use: Yes     Comment: socially  . Drug Use: No  . Sexual Activity: No   Other Topics Concern  . Not on file   Social History Narrative  . No narrative on file    Allergies  Allergen Reactions  . Lisinopril Swelling  . Nsaids     She should avoid all due to renal insufficiency    Patient Active Problem List   Diagnosis Date Noted  . Gout 08/14/2012  . UNSPECIFIED ESSENTIAL HYPERTENSION 09/22/2010  . PULMONARY HYPERTENSION, SECONDARY 07/28/2010  . CARDIOMYOPATHY, SECONDARY 07/14/2009  . LUPUS NEPHRITIS 07/14/2009  . TRANSIENT ISCHEMIC ATTACKS, HX OF 07/14/2009  . ANEMIA, CHRONIC 08/07/2006  . ERYTHEMATOSUS, LUPUS 08/07/2006    Review of Systems  Constitutional: Negative for fever.  Genitourinary: Positive for genital sores and vaginal pain. Negative for urgency, vaginal bleeding, vaginal discharge and pelvic pain.  Skin: Positive for rash.  Psychiatric/Behavioral: Negative for confusion.   A complete 10 system review of systems was obtained and all systems are negative except as noted in the HPI and PMHx.      Objective:  Physical Exam  Nursing note and vitals reviewed. Constitutional: She is oriented to person, place, and time. She appears well-developed and well-nourished. No distress.  HENT:  Head: Normocephalic and atraumatic.  Eyes: EOM are normal.  Neck: Neck supple. No tracheal deviation present.  Cardiovascular: Normal rate.   Pulmonary/Chest: Effort normal. No respiratory distress.  Abdominal: Soft. Bowel sounds are normal. She exhibits no distension and no mass. There is no tenderness. There is no guarding.  Genitourinary: Uterus normal.    No labial  fusion. There is rash on the right labia. There is no tenderness on the right labia. There is rash and tenderness on the left labia. Cervix exhibits discharge (white scant discharge ). Cervix exhibits no motion tenderness. Right adnexum displays no mass and no tenderness. Left adnexum displays no mass and no tenderness. No erythema, tenderness or bleeding around the vagina. No foreign body around the vagina. Vaginal discharge found.  TTP Left labial fold diffuse erythema with skin breakdown. No pustules, induration, active drainage, or vesicles noted.   Right labial fold has a healing rash similar to the left  Musculoskeletal: Normal range of motion.  Neurological: She is alert and oriented to person, place, and time.  Skin: Skin is warm and dry.  Psychiatric: She has a normal mood and affect. Her behavior is normal.    Results for orders placed in visit on 10/23/13  POCT WET PREP WITH KOH      Result Value Range   Trichomonas, UA Negative     Clue Cells Wet Prep HPF POC 1-8     Epithelial Wet Prep HPF POC 6-18     Yeast Wet Prep HPF POC neg     Bacteria Wet Prep HPF POC 4+     RBC Wet Prep HPF POC 0-2     WBC Wet Prep HPF POC 2-4     KOH Prep POC Negative         Assessment & Plan:  Vaginal irritation - Plan: POCT Wet Prep with KOH  Candidiasis of vulva and vagina  ERYTHEMATOSUS, LUPUS  1.  Candidiasis vulvovaginal:  New.  Rx for Diflucan and Nystatin cream provided.  If no improvement in one week, call office.  2.  Lupus: stable.   3.  Genital Herpes: stable; no suggestion of HSV outbreak with current symptoms.  Meds ordered this encounter  Medications  . valACYclovir (VALTREX) 500 MG tablet    Sig: Take 500 mg by mouth 2 (two) times daily.  Marland Kitchen nystatin cream (MYCOSTATIN)    Sig: Apply 1 application topically 2 (two) times daily.    Dispense:  30 g    Refill:  1  . fluconazole (DIFLUCAN) 100 MG tablet    Sig: Two tablets daily x 1 day then one tablet daily x 6 days     Dispense:  8 tablet    Refill:  0    I personally performed the services described in this documentation, which was scribed in my presence.  The recorded information has been reviewed and is accurate.  Reginia Forts, M.D.  [facility] [address] [place], [place]  [phone] phone [phone] fax

## 2013-10-23 NOTE — Patient Instructions (Signed)
1. Please call in one week if rash has not significantly improved. 2.  Keep area clean and dry.

## 2013-10-27 ENCOUNTER — Other Ambulatory Visit: Payer: Self-pay | Admitting: Internal Medicine

## 2013-10-28 ENCOUNTER — Ambulatory Visit: Payer: BC Managed Care – PPO | Admitting: Family Medicine

## 2013-10-28 ENCOUNTER — Encounter: Payer: Self-pay | Admitting: Family Medicine

## 2013-10-28 VITALS — BP 118/80 | HR 81 | Temp 99.5°F | Resp 16 | Ht 67.0 in | Wt 262.0 lb

## 2013-10-28 DIAGNOSIS — R21 Rash and other nonspecific skin eruption: Secondary | ICD-10-CM

## 2013-10-28 DIAGNOSIS — M329 Systemic lupus erythematosus, unspecified: Secondary | ICD-10-CM

## 2013-10-28 DIAGNOSIS — L930 Discoid lupus erythematosus: Secondary | ICD-10-CM

## 2013-10-28 LAB — POCT CBC
GRANULOCYTE PERCENT: 54.9 % (ref 37–80)
HCT, POC: 43.3 % (ref 37.7–47.9)
HEMOGLOBIN: 13.1 g/dL (ref 12.2–16.2)
Lymph, poc: 1.1 (ref 0.6–3.4)
MCH, POC: 28.5 pg (ref 27–31.2)
MCHC: 30.3 g/dL — AB (ref 31.8–35.4)
MCV: 94.2 fL (ref 80–97)
MID (CBC): 0.4 (ref 0–0.9)
MPV: 8.9 fL (ref 0–99.8)
PLATELET COUNT, POC: 233 10*3/uL (ref 142–424)
POC GRANULOCYTE: 1.9 — AB (ref 2–6.9)
POC LYMPH PERCENT: 33.8 %L (ref 10–50)
POC MID %: 11.3 % (ref 0–12)
RBC: 4.6 M/uL (ref 4.04–5.48)
RDW, POC: 14.8 %
WBC: 3.4 10*3/uL — AB (ref 4.6–10.2)

## 2013-10-28 LAB — POCT SEDIMENTATION RATE: POCT SED RATE: 73 mm/h — AB (ref 0–22)

## 2013-10-28 LAB — POCT SKIN KOH: Skin KOH, POC: NEGATIVE

## 2013-10-28 MED ORDER — NYSTATIN 100000 UNIT/GM EX POWD: 1.0000 g | Freq: Three times a day (TID) | CUTANEOUS | Status: DC

## 2013-10-28 MED ORDER — FLUCONAZOLE 200 MG PO TABS: ORAL_TABLET | ORAL | Status: DC

## 2013-10-28 MED ORDER — TRIAMCINOLONE ACETONIDE 0.1 % EX CREA: 1.0000 "application " | TOPICAL_CREAM | Freq: Two times a day (BID) | CUTANEOUS | Status: DC

## 2013-10-28 MED ORDER — PREDNISONE 10 MG PO TABS: ORAL_TABLET | ORAL | Status: DC

## 2013-10-28 NOTE — Progress Notes (Addendum)
This chart was scribed for [name] [name]. Brigitte Pulse, MD by Einar Pheasant, ED Scribe. This patient was seen in room 4 and the patient's care was started at 3:14 PM. Subjective:    Patient ID: [name] [name], female    DOB: 1981/02/11, 33 y.o.   MRN: [id number] Chief Complaint  Patient presents with  . Vaginitis  . Rash    HPI HPI Comments: [name] [name] is a 33 y.o. female who presents to the [facility] with PMHx of genital herpes simplex virus and lupus with skin, kidneys, and cardiac involvement complaining of genital rash onset 2 weeks.  She was seen 1 wk prior and treated w/ diflucan after which she states she is doing much better. However, she is still having a little external irritation/discomfort/itching and so would like to be rechecked to ensure improving.  She states she is not having any sig vaginal d/c and was told on pelvic exam last wk that she also had minimal discharge.  She is also complaining of a rash around her hands and the bridge of her nose. She suspects that the rash is due to a flare up of her lupus. She states she is not currently being followed by a rheumatologist for her lupus - she previously saw Dr. Estanislado Pandy but has not seen her in yrs.  She denies taking any new soaps, detergents, or shampoos.   She has not been sexually active for a while so is not needing birth control. Her LNMP was 10/16/13.    Past Medical History  Diagnosis Date  . Unspecified essential hypertension   . Unspecified deficiency anemia   . Systemic lupus erythematosus   . Secondary cardiomyopathy, unspecified   . ERYTHEMATOSUS, LUPUS 08/07/2006  . CHF (congestive heart failure) 5-6 yrs ago  hosp  . Chronic kidney disease     sees specialist [place]  f/u lupus  . Intestinal infection due to Clostridium difficile    Allergies  Allergen Reactions  . Lisinopril Swelling  . Nsaids     She should avoid all due to renal insufficiency   Current Outpatient Prescriptions  on File Prior to Visit  Medication Sig Dispense Refill  . allopurinol (ZYLOPRIM) 300 MG tablet TAKE 1 TABLET BY MOUTH DAILY  90 tablet  0  . amLODipine (NORVASC) 5 MG tablet Take 5 mg by mouth daily.        Marland Kitchen EPINEPHrine (EPIPEN) 0.3 mg/0.3 mL SOAJ Inject 0.3 mLs (0.3 mg total) into the muscle once.  1 Device  1  . fluconazole (DIFLUCAN) 100 MG tablet Two tablets daily x 1 day then one tablet daily x 6 days  8 tablet  0  . hydroxychloroquine (PLAQUENIL) 200 MG tablet TAKE 1 TABLET BY MOUTH TWICE A DAY  60 tablet  0  . metoprolol (LOPRESSOR) 100 MG tablet Take 1 tablet (100 mg total) by mouth 2 (two) times daily.  60 tablet  11  . nystatin cream (MYCOSTATIN) Apply 1 application topically 2 (two) times daily.  30 g  1  . valACYclovir (VALTREX) 500 MG tablet Take 500 mg by mouth 2 (two) times daily.       No current facility-administered medications on file prior to visit.    Review of Systems  Constitutional: Positive for fatigue. Negative for fever, chills and diaphoresis.  Cardiovascular: Positive for leg swelling.  Genitourinary: Negative for vaginal bleeding, vaginal discharge and pelvic pain.  Skin: Positive for color change, rash and wound. Negative for pallor.  Hematological: Positive for adenopathy. Does not bruise/bleed easily.  Psychiatric/Behavioral: Positive for sleep disturbance.      Triage vitals: BP 118/80  Pulse 81  Temp(Src) 99.5 F (37.5 C) (Oral)  Resp 16  Ht $R'5\' 7"'gS$  (1.702 m)  Wt 262 lb (118.842 kg)  BMI 41.03 kg/m2  SpO2 100%  LMP 10/16/2013 Objective:   Physical Exam  Nursing note and vitals reviewed. Constitutional: She is oriented to person, place, and time. She appears well-developed and well-nourished. No distress.  HENT:  Head: Normocephalic and atraumatic.  Cardiovascular: Normal rate, regular rhythm and normal heart sounds.   Pulmonary/Chest: Effort normal and breath sounds normal. No respiratory distress. She has no wheezes. She has no rales.    Genitourinary:    There is breast tenderness. No breast bleeding. No labial fusion. There is rash, tenderness and lesion on the right labia. There is no injury on the right labia. There is rash, tenderness and lesion on the left labia. There is no injury on the left labia. No vaginal discharge found.  In perineal skin folds between upper inner thighs and vulva/labia majora bilaterally is moist raw macular type rash with mild ulcerations. Rash w/ well defined rolled pink edge and sevarall satellites papules and ulcerations extending over lower labia majora. Tenderness to palpation.   Under left breast poorly defined pink 2-3 cm macule with small amout of serous drainage from mult 2-75mm ulcerations in macule. No rash under right breast.  Neurological: She is alert and oriented to person, place, and time.  Skin: Skin is warm and dry. Rash noted. Rash is macular and maculopapular. She is not diaphoretic.  Plaque like erythematous rash, mildly warm, nttp, poorly defined,  Scattered around left cheek, bridge of noseh, and bilateral (but Rt>Lt) forearms and hands.  Psychiatric: She has a normal mood and affect.   Results for orders placed in visit on 10/28/13  WOUND CULTURE      Result Value Range   Gram Stain Rare     Gram Stain WBC present-predominately PMN     Gram Stain No Squamous Epithelial Cells Seen     Gram Stain Abundant Gram Positive Cocci In Pairs In Chains     Preliminary Report Culture reincubated for better growth    POCT CBC      Result Value Range   WBC 3.4 (*) 4.6 - 10.2 K/uL   Lymph, poc 1.1  0.6 - 3.4   POC LYMPH PERCENT 33.8  10 - 50 %L   MID (cbc) 0.4  0 - 0.9   POC MID % 11.3  0 - 12 %M   POC Granulocyte 1.9 (*) 2 - 6.9   Granulocyte percent 54.9  37 - 80 %G   RBC 4.60  4.04 - 5.48 M/uL   Hemoglobin 13.1  12.2 - 16.2 g/dL   HCT, POC 43.3  37.7 - 47.9 %   MCV 94.2  80 - 97 fL   MCH, POC 28.5  27 - 31.2 pg   MCHC 30.3 (*) 31.8 - 35.4 g/dL   RDW, POC 14.8      Platelet Count, POC 233  142 - 424 K/uL   MPV 8.9  0 - 99.8 fL  POCT SEDIMENTATION RATE      Result Value Range   POCT SED RATE 73 (*) 0 - 22 mm/hr  POCT SKIN KOH      Result Value Range   Skin KOH, POC Negative        Assessment &  Plan:  Rash and nonspecific skin eruption - Plan: POCT CBC, POCT SEDIMENTATION RATE, POCT Skin KOH, Wound culture - Pt has vulvovaginal candidiasis - repeat oral fluconazole 2x/wk x 1 mo (treated last wk to $Rem'100mg'DMee$  qd x 1 wk) and start topical nystatin powder as skin folds where rash is located are VERY moist on exam today.  There is the possibility that pt is developing HSV outbreak on top of candidiasis though I think small ulcers look more like sattelite lesions of yeast - however, she should go ahead and cover w/ antiviral since we are starting her on prednisone.  Rash under left breast looks very similar to vulvovaginal w/ raw erythematous moist plaque with small scattered ulcerations within - but could be poss imepetigo with serous drainage so wound clx pending but recommend top nystatin to left lower breast for now.  Lupus - Plan: POCT CBC, POCT SEDIMENTATION RATE, POCT Skin KOH, Wound culture - burst of oral and topical prednisone for erythematous plaques on forearm, face, hands due to cutaneous lupus outbreak - check esr to confirm.  Pt previously followed by Dr. Estanislado Pandy - seems like she may need to re-establish since clearly uncontrolled and pt reports h/o cardiac and kidney involvement.  LUPUS NEPHRITIS  Meds ordered this encounter  Medications  . fluconazole (DIFLUCAN) 200 MG tablet    Sig: Take 1 tab po twice a week    Dispense:  8 tablet    Refill:  0  . predniSONE (DELTASONE) 10 MG tablet    Sig: Take 6 tabs po d1, 5 tabs po d2, 4 tabs po d3, 3 tabs po d4, 2 tabs po d5, 1 tab po d6    Dispense:  21 tablet    Refill:  0  . nystatin (MYCOSTATIN/NYSTOP) 100000 UNIT/GM POWD    Sig: Apply 1 g topically 3 (three) times daily.    Dispense:  60 g     Refill:  2  . triamcinolone cream (KENALOG) 0.1 %    Sig: Apply 1 application topically 2 (two) times daily.    Dispense:  85.2 g    Refill:  0    I personally performed the services described in this documentation, which was scribed in my presence. The recorded information has been reviewed and considered, and addended by me as needed.  Delman Cheadle, MD MPH

## 2013-10-28 NOTE — Patient Instructions (Signed)
Apply the powder topically vaginally and under your left breast.   Use the topical steroid, triamcinolone on other lesions but sparingly on your face.  Candidal Vulvovaginitis Candidal vulvovaginitis is an infection of the vagina and vulva. The vulva is the skin around the opening of the vagina. This may cause itching and discomfort in and around the vagina.  HOME CARE  Only take medicine as told by your doctor.  Do not have sex (intercourse) until the infection is healed or as told by your doctor.  Practice safe sex.  Tell your sex partner about your infection.  Do not douche or use tampons.  Wear cotton underwear. Do not wear tight pants or panty hose.  Eat yogurt. This may help treat and prevent yeast infections. GET HELP RIGHT AWAY IF:   You have a fever.  Your problems get worse during treatment or do not get better in 3 days.  You have discomfort, irritation, or itching in your vagina or vulva area.  You have pain after sex.  You start to get belly (abdominal) pain. MAKE SURE YOU:  Understand these instructions.  Will watch your condition.  Will get help right away if you are not doing well or get worse. Document Released: 01/05/2009 Document Revised: 01/01/2012 Document Reviewed: 01/05/2009 [facility] Patient Information 2014 [place], [place]. Lupus Lupus (also called systemic lupus erythematosus, SLE) is a disorder of the body's natural defense system (immune system). In lupus, the immune system attacks various areas of the body (autoimmune disease). CAUSES The cause is unknown. However, lupus runs in families. Certain genes can make you more likely to develop lupus. It is 10 times more common in women than in men. Lupus is also more common in African Americans and Asians. Other factors also play a role, such as viruses (Epstein-Barr virus, EBV), stress, hormones, cigarette smoke, and certain drugs. SYMPTOMS Lupus can affect many parts of the body, including the  joints, skin, kidneys, lungs, heart, nervous system, and blood vessels. The signs and symptoms of lupus differ from person to person. The disease can range from mild to life-threatening. Typical features of lupus include:  Butterfly-shaped rash over the face.  Arthritis involving one or more joints.  Kidney disease.  Fever, weight loss, hair loss, fatigue.  Poor circulation in the fingers and toes (Raynaud's disease).  Chest pain when taking deep breaths. Abdominal pain may also occur.  Skin rash in areas exposed to the sun.  Sores in the mouth and nose. DIAGNOSIS Diagnosing lupus can take a long time and is often difficult. An exam and an accurate account of your symptoms and health problems is very important. Blood tests are necessary, though no single test can confirm or rule out lupus. Most people with lupus test positive for antinuclear antibodies (ANA) on a blood test. Additional blood tests, a urine test (urinalysis), and sometimes a kidney or skin tissue sample (biopsy) can help to confirm or rule out lupus. TREATMENT There is no cure for lupus. Your caregiver will develop a treatment plan based on your age, sex, health, symptoms, and lifestyle. The goals are to prevent flares, to treat them when they do occur, and to minimize organ damage and complications. How the disease may affect each person varies widely. Most people with lupus can live normal lives, but this disorder must be carefully monitored. Treatment must be adjusted as necessary to prevent serious complications. Medicines used for treatment:  Nonsteroidal anti-inflammatory drugs (NSAIDs) decrease inflammation and can help with chest pain, joint pain,  and fevers. Examples include ibuprofen and naproxen.  Antimalarial drugs were designed to treat malaria. They also treat fatigue, joint pain, skin rashes, and inflammation of the lungs in patients with lupus.  Corticosteroids are powerful hormones that rapidly suppress  inflammation. The lowest dose with the highest benefit will be chosen. They can be given by cream, pills, injections, and through the vein (intravenously).  Immunosuppressive drugs block the making of immune cells. They may be used for kidney or nerve disease. HOME CARE INSTRUCTIONS  Exercise. Low-impact activities can usually help keep joints flexible without being too strenuous.  Rest after periods of exercise.  Avoid excessive sun exposure.  Follow proper nutrition and take supplements as recommended by your caregiver.  Stress management can be helpful. SEEK MEDICAL CARE IF:  You have increased fatigue.  You develop pain.  You develop a rash.  You have an oral temperature above 102 F (38.9 C).  You develop abdominal discomfort.  You develop a headache.  You experience dizziness. [place] of Neurological Disorders and Stroke: [employer] of Rheumatology: www.rheumatology.[facility] of Arthritis and Musculoskeletal and Skin Diseases: www.niams.[employer] Document Released: 09/29/2002 Document Revised: 01/01/2012 Document Reviewed: 01/20/2010 [facility] Patient Information 2014 [place], [place].

## 2013-10-29 MED ORDER — VALACYCLOVIR HCL 1 G PO TABS: 500.0000 mg | ORAL_TABLET | Freq: Two times a day (BID) | ORAL | Status: DC

## 2013-10-29 MED ORDER — MUPIROCIN 2 % EX OINT: 1.0000 "application " | TOPICAL_OINTMENT | Freq: Three times a day (TID) | CUTANEOUS | Status: DC

## 2013-10-30 LAB — WOUND CULTURE: Gram Stain: NONE SEEN

## 2013-11-05 ENCOUNTER — Other Ambulatory Visit (HOSPITAL_COMMUNITY): Payer: Self-pay | Admitting: Nephrology

## 2013-11-05 DIAGNOSIS — R809 Proteinuria, unspecified: Secondary | ICD-10-CM

## 2013-11-07 ENCOUNTER — Ambulatory Visit: Payer: BC Managed Care – PPO

## 2013-11-12 ENCOUNTER — Other Ambulatory Visit (HOSPITAL_COMMUNITY): Payer: Self-pay | Admitting: Radiology

## 2013-11-14 ENCOUNTER — Encounter (HOSPITAL_COMMUNITY): Payer: Self-pay | Admitting: Pharmacy Technician

## 2013-11-14 ENCOUNTER — Other Ambulatory Visit (HOSPITAL_COMMUNITY): Payer: Self-pay | Admitting: Radiology

## 2013-11-17 ENCOUNTER — Encounter (HOSPITAL_COMMUNITY): Payer: Self-pay

## 2013-11-17 ENCOUNTER — Ambulatory Visit (HOSPITAL_COMMUNITY)
Admission: RE | Admit: 2013-11-17 | Discharge: 2013-11-17 | Disposition: A | Discharge status: Home / Self Care | Payer: BC Managed Care – PPO | Source: Ambulatory Visit | Attending: Nephrology | Admitting: Nephrology

## 2013-11-17 DIAGNOSIS — R809 Proteinuria, unspecified: Secondary | ICD-10-CM

## 2013-11-17 DIAGNOSIS — M329 Systemic lupus erythematosus, unspecified: Secondary | ICD-10-CM | POA: Insufficient documentation

## 2013-11-17 DIAGNOSIS — N058 Unspecified nephritic syndrome with other morphologic changes: Secondary | ICD-10-CM | POA: Insufficient documentation

## 2013-11-17 LAB — PROTIME-INR
INR: 0.98 (ref 0.00–1.49)
Prothrombin Time: 12.8 seconds (ref 11.6–15.2)

## 2013-11-17 LAB — CBC
HCT: 35.4 % — ABNORMAL LOW (ref 36.0–46.0)
HEMOGLOBIN: 11.7 g/dL — AB (ref 12.0–15.0)
MCH: 28.9 pg (ref 26.0–34.0)
MCHC: 33.1 g/dL (ref 30.0–36.0)
MCV: 87.4 fL (ref 78.0–100.0)
Platelets: 226 10*3/uL (ref 150–400)
RBC: 4.05 MIL/uL (ref 3.87–5.11)
RDW: 15 % (ref 11.5–15.5)
WBC: 3.7 10*3/uL — ABNORMAL LOW (ref 4.0–10.5)

## 2013-11-17 LAB — APTT: aPTT: 29 seconds (ref 24–37)

## 2013-11-17 MED ORDER — MIDAZOLAM HCL 2 MG/2ML IJ SOLN
INTRAMUSCULAR | Status: AC | PRN
  Administered 2013-11-17 (×2): 1 mg via INTRAVENOUS

## 2013-11-17 MED ORDER — FENTANYL CITRATE 0.05 MG/ML IJ SOLN
INTRAMUSCULAR | Status: AC
  Filled 2013-11-17: qty 2

## 2013-11-17 MED ORDER — SODIUM CHLORIDE 0.9 % IV SOLN: Freq: Once | INTRAVENOUS | Status: DC

## 2013-11-17 MED ORDER — MIDAZOLAM HCL 2 MG/2ML IJ SOLN
INTRAMUSCULAR | Status: AC
  Filled 2013-11-17: qty 2

## 2013-11-17 MED ORDER — FENTANYL CITRATE 0.05 MG/ML IJ SOLN
INTRAMUSCULAR | Status: AC | PRN
  Administered 2013-11-17 (×2): 50 ug via INTRAVENOUS

## 2013-11-17 MED ORDER — HYDROCODONE-ACETAMINOPHEN 5-325 MG PO TABS: 1.0000 | ORAL_TABLET | ORAL | Status: DC | PRN

## 2013-11-17 NOTE — Discharge Instructions (Signed)
Kidney Biopsy, Care After °Refer to this sheet in the next few weeks. These instructions provide you with information on caring for yourself after your procedure. Your health care provider may also give you more specific instructions. Your treatment has been planned according to current medical practices, but problems sometimes occur. Call your health care provider if you have any problems or questions after your procedure.  °WHAT TO EXPECT AFTER THE PROCEDURE  °· You may notice blood in the urine for the first 24 hours after the biopsy. °· You may feel some pain at the biopsy site for 1 2 weeks after the biopsy. °HOME CARE INSTRUCTIONS °· Do not lift anything heavier than 10 lb (4.5 kg) for 2 weeks. °· Do not take any non-steroidal anti-inflammatory drugs (NSAIDs) or any blood thinners for a week after the biopsy unless instructed to do so by your health care provider. °· Only take medicines for pain, fever, or discomfort as directed by your health care provider. °SEEK MEDICAL CARE IF: °· You have bloody urine more than 24 hours after the biopsy.   °· You develop a fever.   °· You cannot urinate.   °· You have increasing pain at the biopsy site.   °SEEK IMMEDIATE MEDICAL CARE IF: °You feel faint or dizzy.  °Document Released: 06/11/2013 Document Reviewed: 06/11/2013 °[employer]® Patient Information ©2014 [employer]. ° °

## 2013-11-17 NOTE — Procedures (Signed)
[place] core L renal biopsy [id number] x3 LLP No complication No blood loss. See complete dictation in [facility].

## 2013-11-17 NOTE — H&P (Signed)
[name] [name] is an 33 y.o. female.   Chief Complaint: pt with known proteinuria followed over 5 yrs in [place] Now followed  in [place] and MD has noted increase in proteinuria 2 weeks ago Hx: Lupus Class V; increasing Cr Scheduled now for random renal biopsy  HPI: HTN; SLE; anemia; CHF; CKD  Past Medical History  Diagnosis Date  . Unspecified essential hypertension   . Unspecified deficiency anemia   . Systemic lupus erythematosus   . Secondary cardiomyopathy, unspecified   . ERYTHEMATOSUS, LUPUS 08/07/2006  . CHF (congestive heart failure) 5-6 yrs ago  hosp  . Chronic kidney disease     sees specialist [place]  f/u lupus  . Intestinal infection due to Clostridium difficile     History reviewed. No pertinent past surgical history.  Family History  Problem Relation Age of Onset  . Lupus Mother   . Arthritis Maternal Grandmother   . Cancer Maternal Grandmother   . Prostate cancer Paternal Grandfather   . Cancer Paternal Grandfather   . Diabetes Cousin    Social History:  reports that she has never smoked. She has never used smokeless tobacco. She reports that she drinks alcohol. She reports that she does not use illicit drugs.  Allergies:  Allergies  Allergen Reactions  . Lisinopril Swelling  . Nsaids     She should avoid all due to renal insufficiency     (Not in a hospital admission)  No results found for this or any previous visit (from the past 48 hour(s)). No results found.  Review of Systems  Constitutional: Negative for fever and weight loss.  Respiratory: Negative for cough and shortness of breath.   Cardiovascular: Negative for chest pain.  Gastrointestinal: Negative for nausea, vomiting and abdominal pain.  Genitourinary:       Proteinuria  Musculoskeletal: Negative for back pain.  Neurological: Negative for dizziness, weakness and headaches.  Psychiatric/Behavioral: Negative for substance abuse.    Blood pressure 138/97, pulse 80, temperature  98.1 F (36.7 C), temperature source Oral, resp. rate 20, height 5\' 7"  (1.702 m), weight 250 lb (113.399 kg), last menstrual period 10/16/2013, SpO2 100.00%. Physical Exam  Constitutional: She is oriented to person, place, and time. She appears well-developed and well-nourished.  Cardiovascular: Normal rate and regular rhythm.   No murmur heard. Respiratory: Effort normal and breath sounds normal. She has no wheezes.  GI: Soft. Bowel sounds are normal. There is no tenderness.  Musculoskeletal: Normal range of motion.  Neurological: She is alert and oriented to person, place, and time.  Skin: Skin is warm and dry.  Psychiatric: She has a normal mood and affect. Her behavior is normal. Judgment and thought content normal.     Assessment/Plan SLE; CKD Recent increase proteinuria and creatinine Now scheduled for random renal bx Pt aware of procedure benefits and risks and agreeable to proceed Consent signed and in chart  [place] A 11/17/2013, 9:28 AM

## 2013-11-25 ENCOUNTER — Encounter (HOSPITAL_COMMUNITY): Payer: Self-pay

## 2013-11-26 ENCOUNTER — Encounter: Payer: Self-pay | Admitting: Internal Medicine

## 2013-11-26 ENCOUNTER — Ambulatory Visit (INDEPENDENT_AMBULATORY_CARE_PROVIDER_SITE_OTHER): Payer: BC Managed Care – PPO | Admitting: Internal Medicine

## 2013-11-26 VITALS — BP 122/84 | HR 80 | Temp 98.0°F | Ht 68.0 in | Wt 266.0 lb

## 2013-11-26 DIAGNOSIS — Z23 Encounter for immunization: Secondary | ICD-10-CM

## 2013-11-26 NOTE — Progress Notes (Signed)
Patient with complicated SLE with known cardiomyopathy, secondary pulmonary hypertension.  She comes in complainting of a "lupus flare"- butterfly rash, "pleurisy" for 3 weeks.   Patient is no longer under the care of rheumatologist (there were apparently problems with medication compliance and f/u). She is taking plaquenil  She is seeing nephrology- Dr. Joelyn Oms. Reviewed recent biopsy  i called dr sanford and discussed He will contact patient for more aggressive care- Total time with patient 20 minutes- all coordinating care, discussing with patient

## 2013-11-26 NOTE — Progress Notes (Signed)
Pre visit review using our clinic review tool, if applicable. No additional management support is needed unless otherwise documented below in the visit note. 

## 2013-11-29 ENCOUNTER — Other Ambulatory Visit: Payer: Self-pay | Admitting: Internal Medicine

## 2013-12-02 ENCOUNTER — Ambulatory Visit: Payer: BC Managed Care – PPO | Admitting: Cardiology

## 2013-12-11 ENCOUNTER — Encounter: Payer: Self-pay | Admitting: Cardiology

## 2013-12-11 ENCOUNTER — Ambulatory Visit (INDEPENDENT_AMBULATORY_CARE_PROVIDER_SITE_OTHER): Payer: BC Managed Care – PPO | Admitting: Cardiology

## 2013-12-11 VITALS — BP 142/100 | HR 65 | Ht 68.0 in | Wt 265.0 lb

## 2013-12-11 DIAGNOSIS — I422 Other hypertrophic cardiomyopathy: Secondary | ICD-10-CM

## 2013-12-11 NOTE — Patient Instructions (Signed)
The current medical regimen is effective;  continue present plan and medications.  Your physician has requested that you have an echocardiogram. Echocardiography is a painless test that uses sound waves to create images of your heart. It provides your doctor with information about the size and shape of your heart and how well your heart's chambers and valves are working. This procedure takes approximately one hour. There are no restrictions for this procedure.  Follow up in 2 months with Dr Percival Spanish

## 2013-12-11 NOTE — Progress Notes (Signed)
HPI The patient presents as a new patient to me.  She had a history of  Nonischemic cardiomyopathy. I was able to find a  Cardiac cath done in 2007. Her ejection fraction at that time was 25%. However, followup echo with the most recent being in 2010 demonstrated the EF to be 55%. She has been managed for lupus with some renal insufficiency. Recently she was started on prednisone because of a flare of her lupus. She does report a chronic history of chest pain. She says this is unchanged from previous. She does not get this routinely but it happens sporadically. If she gets it she has to slow down what she is doing. There are days when she can be active and it does not bother her. She does not have any PND or orthopnea. She doesn't have any palpitations, presyncope or syncope. She has had increasing lower extremity swelling.  Allergies  Allergen Reactions  . Lisinopril Swelling  . Nsaids     She should avoid all due to renal insufficiency    Current Outpatient Prescriptions  Medication Sig Dispense Refill  . allopurinol (ZYLOPRIM) 300 MG tablet TAKE 1 TABLET BY MOUTH DAILY  90 tablet  0  . amLODipine (NORVASC) 10 MG tablet Take 10 mg by mouth daily.      Marland Kitchen EPINEPHrine (EPIPEN) 0.3 mg/0.3 mL SOAJ Inject 0.3 mLs (0.3 mg total) into the muscle once.  1 Device  1  . furosemide (LASIX) 20 MG tablet Take 20 mg by mouth daily.       . hydroxychloroquine (PLAQUENIL) 200 MG tablet Take 200 mg by mouth 2 (two) times daily.      . metoprolol (LOPRESSOR) 100 MG tablet Take 200 mg by mouth 2 (two) times daily.      . predniSONE (DELTASONE) 20 MG tablet Take 20 mg by mouth daily with breakfast.       . valACYclovir (VALTREX) 1000 MG tablet Take 1,000 mg by mouth daily.        No current facility-administered medications for this visit.    Past Medical History  Diagnosis Date  . Unspecified essential hypertension   . Unspecified deficiency anemia   . Systemic lupus erythematosus   . Secondary  cardiomyopathy, unspecified   . ERYTHEMATOSUS, LUPUS 08/07/2006  . CHF (congestive heart failure) 5-6 yrs ago  hosp  . Chronic kidney disease     sees specialist [place]  f/u lupus  . Intestinal infection due to Clostridium difficile     No past surgical history on file.  ROS:  As stated in the HPI and negative for all other systems.  PHYSICAL EXAM BP 142/100  Pulse 65  Ht 5\' 8"  (1.727 m)  Wt 265 lb (120.203 kg)  BMI 40.30 kg/m2 GENERAL:  Well appearing HEENT:  Pupils equal round and reactive, fundi not visualized, oral mucosa unremarkable NECK:  Mild  7 cm jugular venous distention at 45 degrees, waveform within normal limits, carotid upstroke brisk and symmetric, no bruits, no thyromegaly LYMPHATICS:  No cervical, inguinal adenopathy LUNGS:  Clear to auscultation bilaterally BACK:  No CVA tenderness CHEST:  Unremarkable HEART:  PMI not displaced or sustained,S1 and S2 within normal limits, no S3, no S4, no clicks, no rubs, no  murmurs ABD:  Flat, positive bowel sounds normal in frequency in pitch, no bruits, no rebound, no guarding, no midline pulsatile mass, no hepatomegaly, no splenomegaly EXT:  2 plus pulses throughout, moderate ankle edema, no cyanosis no clubbing SKIN:  No rashes no nodules NEURO:  Cranial nerves II through XII grossly intact, motor grossly intact throughout PSYCH:  Cognitively intact, oriented to person place and time   EKG:  Sinus rhythm, rate 65  and, axis within normal limits, intervals within normal limits, no acute ST-T wave changes.]  ASSESSMENT AND PLAN  CARDIOMYOPATHY:   Will reassess her with an echocardiogram. I suspect some diastolic dysfunction. We did talk about salt and fluid restriction.  HTN:   Her blood pressure is elevated.   I have instructed the patient to record a blood pressure diary and recording this. This will be presented for my review and pending these results I will make further suggestions about changes in therapy for optimal  blood pressure control.  For now she will continue the meds as listed.   I supsect some of her edema and HTN are related to the resent restarting of prednisone.    EDEMA:  We discussed compression stockings and conservative treatment.   CHEST PAIN:  Her pain is atypical.  I think that there is a low pretest probability of obstructive CAD.  At this point I am not considering further cardiovascular studies.

## 2013-12-15 ENCOUNTER — Other Ambulatory Visit: Payer: Self-pay | Admitting: Internal Medicine

## 2013-12-22 ENCOUNTER — Ambulatory Visit (INDEPENDENT_AMBULATORY_CARE_PROVIDER_SITE_OTHER): Payer: BC Managed Care – PPO | Admitting: Nurse Practitioner

## 2013-12-22 ENCOUNTER — Encounter: Payer: Self-pay | Admitting: Nurse Practitioner

## 2013-12-22 VITALS — BP 120/88 | HR 90 | Temp 99.2°F | Ht 68.0 in | Wt 264.0 lb

## 2013-12-22 DIAGNOSIS — M329 Systemic lupus erythematosus, unspecified: Secondary | ICD-10-CM

## 2013-12-22 DIAGNOSIS — L600 Ingrowing nail: Secondary | ICD-10-CM

## 2013-12-22 DIAGNOSIS — L930 Discoid lupus erythematosus: Secondary | ICD-10-CM

## 2013-12-22 LAB — COMPREHENSIVE METABOLIC PANEL
ALT: 21 U/L (ref 0–35)
AST: 26 U/L (ref 0–37)
Albumin: 2.4 g/dL — ABNORMAL LOW (ref 3.5–5.2)
Alkaline Phosphatase: 68 U/L (ref 39–117)
BUN: 31 mg/dL — ABNORMAL HIGH (ref 6–23)
CO2: 26 meq/L (ref 19–32)
Calcium: 8.3 mg/dL — ABNORMAL LOW (ref 8.4–10.5)
Chloride: 107 mEq/L (ref 96–112)
Creatinine, Ser: 1.7 mg/dL — ABNORMAL HIGH (ref 0.4–1.2)
GFR: 43.32 mL/min — AB (ref >60.00)
Glucose, Bld: 82 mg/dL (ref 70–99)
Potassium: 4.2 mEq/L (ref 3.5–5.1)
SODIUM: 140 meq/L (ref 135–145)
TOTAL PROTEIN: 5.5 g/dL — AB (ref 6.0–8.3)
Total Bilirubin: 0.4 mg/dL (ref 0.3–1.2)

## 2013-12-22 LAB — CBC WITH DIFFERENTIAL/PLATELET
Basophils Absolute: 0 10*3/uL (ref 0.0–0.1)
Basophils Relative: 0.2 % (ref 0.0–3.0)
EOS PCT: 6.5 % — AB (ref 0.0–5.0)
Eosinophils Absolute: 0.2 10*3/uL (ref 0.0–0.7)
HCT: 37.3 % (ref 36.0–46.0)
Hemoglobin: 11.8 g/dL — ABNORMAL LOW (ref 12.0–15.0)
Lymphocytes Relative: 19.6 % (ref 12.0–46.0)
Lymphs Abs: 0.7 10*3/uL (ref 0.7–4.0)
MCHC: 31.8 g/dL (ref 30.0–36.0)
MCV: 91.6 fl (ref 78.0–100.0)
MONO ABS: 0.4 10*3/uL (ref 0.1–1.0)
Monocytes Relative: 10.7 % (ref 3.0–12.0)
Neutro Abs: 2.3 10*3/uL (ref 1.4–7.7)
Neutrophils Relative %: 63 % (ref 43.0–77.0)
PLATELETS: 264 10*3/uL (ref 150.0–400.0)
RBC: 4.07 Mil/uL (ref 3.87–5.11)
RDW: 17 % — ABNORMAL HIGH (ref 11.5–14.6)
WBC: 3.6 10*3/uL — ABNORMAL LOW (ref 4.5–10.5)

## 2013-12-22 LAB — SEDIMENTATION RATE: Sed Rate: 61 mm/hr — ABNORMAL HIGH (ref 0–22)

## 2013-12-22 MED ORDER — DOXYCYCLINE HYCLATE 100 MG PO TABS: 100.0000 mg | ORAL_TABLET | Freq: Two times a day (BID) | ORAL | Status: DC

## 2013-12-22 MED ORDER — PREDNISONE 10 MG PO TABS: ORAL_TABLET | ORAL | Status: DC

## 2013-12-22 NOTE — Progress Notes (Signed)
Pre visit review using our clinic review tool, if applicable. No additional management support is needed unless otherwise documented below in the visit note. 

## 2013-12-22 NOTE — Patient Instructions (Signed)
I think you are having lupus flare. Start prednisone. See rheumatology.  Keep vaseline on toe. Keep covered. Wash once daily with mild soap like DOVE. Wear loose shoes. Nice to meet you!

## 2013-12-22 NOTE — Progress Notes (Signed)
Subjective:    [name] [name] is a 33 y.o. female who presents with recurrent facial swelling, LE extremity swelling and "infected toe nail". She has SLE with skin, joint, lung, heart, and kidney involvement. She lost f/u w/her rheumatologist 18 mos to 2 years ago and has not seen pulmonary specialist in just under 2 years. Historical meds include cellcept, imuran, plaquenil, prednisone, and letairis (pulmonary HTN). Current meds include PLQ and prednisone for flare ups. She recently had kidney biopsy-results unknown; saw cardiology a few weeks ago, has echo pending.  Today she presents with butterfly rash, facial edema, and chronic swelling of LE. She denies SOB, palpitations, pain. Facial swelling has gotten worse in last few days since weaning off prednisone. (She was started on a 40 mg taper about 1 mo. ago at urgent care). Regarding "infected toenail", she c/o sore L great toe at lateral side of toe with purulent drainage. Swelling and drainage have been present for several weeks, but have gotten worse in last week.  Previous Report(s) Reviewed: previous primary care, urgent care, pulmonary, and cardiac ov notes.   The following portions of the patient's history were reviewed and updated as appropriate: allergies, current medications, past medical history, past surgical history and problem list.  Review of Systems Constitutional: negative for fevers Respiratory: negative for cough and dyspnea on exertion Cardiovascular: negative for chest pain and palpitations Integument/breast: positive for butterfly rash, facial swelling, "infected toenail" Musculoskeletal:negative for arthralgias     Objective:    BP 120/88  Pulse 90  Temp(Src) 99.2 F (37.3 C) (Oral)  Ht $R'5\' 8"'Yn$  (1.727 m)  Wt 264 lb (119.75 kg)  BMI 40.15 kg/m2  SpO2 98%  LMP 12/18/2013 General appearance: alert, cooperative and appears stated age Head: Normocephalic, without obvious abnormality, atraumatic Eyes: negative  findings: lids and lashes normal and conjunctivae and sclerae normal Lungs: clear to auscultation bilaterally Heart: regular rate and rhythm, S1, S2 normal, no murmur, click, rub or gallop and bounding apical pulse Extremities: edema +2 pitting from feet to knees Pulses: 2+ and symmetric Skin: erythema - face, across cheeks & nose and swollen lateral edge L great toenail, serous drainage, tender.   Lab Review ESR 73, WBC 3.7, Hgb 11.7 11/17/2013.   Procedure Note: L great toenail removal, performed by Dr Crissie Sickles. Toe & nail cleansed with 3 betadine swabs. Block achieved with 1% lidocaine using 25 ga 1 1/2 needle, approximately 60ml lidocaine administered to achieve anesthesia. Nail was split with nail splitter, the Lateral edge removed, then the medial edge removed. Minimal bleeding, Pt. tolerated well. Toe cleaned with saline, betadine removed, bacitracin applied to bare nailbed, nonadherent dressing applied. Wound care instructions given.     Assessment:    SLE, Flare-facial swelling, butterfly rash, chronic LE edema. L great toenail ingrown, infected.     Plan:  Labs: C3, C4, CBC, ESR, CMET. Prednisone taper. Ref to rheum, Gavin Pound.  Wound care instructions given.

## 2013-12-23 ENCOUNTER — Telehealth: Payer: Self-pay | Admitting: Nurse Practitioner

## 2013-12-23 LAB — C4 COMPLEMENT: C4 Complement: 21 mg/dL (ref 10–40)

## 2013-12-23 LAB — C3 COMPLEMENT: C3 Complement: 79 mg/dL — ABNORMAL LOW (ref 90–180)

## 2013-12-23 NOTE — Telephone Encounter (Signed)
Pt is in SLE flare w/ possible lupus nephritis: C3 low. ESR high, WBC low. Hgb slightly low. BUN & creat elevated, Ca slightly low. Appears pt owes balance to [facility] and was dismissed by [employer]. Will refer to [facility] Rheum. Discussed seriousness of disease condition w/pt & importance of follow-up w/rheum.

## 2013-12-24 NOTE — Telephone Encounter (Signed)
Appointment has been made, patient aware. 

## 2013-12-31 ENCOUNTER — Ambulatory Visit (HOSPITAL_COMMUNITY): Payer: BC Managed Care – PPO | Attending: Cardiovascular Disease | Admitting: Radiology

## 2013-12-31 DIAGNOSIS — I422 Other hypertrophic cardiomyopathy: Secondary | ICD-10-CM | POA: Insufficient documentation

## 2013-12-31 DIAGNOSIS — I428 Other cardiomyopathies: Secondary | ICD-10-CM

## 2013-12-31 NOTE — Progress Notes (Signed)
Echocardiogram performed.  

## 2014-02-19 ENCOUNTER — Ambulatory Visit: Payer: BC Managed Care – PPO | Admitting: Cardiology

## 2014-02-26 ENCOUNTER — Other Ambulatory Visit: Payer: Self-pay | Admitting: Internal Medicine

## 2014-03-06 ENCOUNTER — Other Ambulatory Visit: Payer: Self-pay | Admitting: Internal Medicine

## 2014-04-10 ENCOUNTER — Other Ambulatory Visit: Payer: Self-pay | Admitting: Internal Medicine

## 2014-05-28 ENCOUNTER — Encounter (HOSPITAL_COMMUNITY): Payer: Self-pay | Admitting: Emergency Medicine

## 2014-05-28 ENCOUNTER — Emergency Department (INDEPENDENT_AMBULATORY_CARE_PROVIDER_SITE_OTHER)
Admission: EM | Admit: 2014-05-28 | Discharge: 2014-05-28 | Disposition: A | Discharge status: Home / Self Care | Payer: BC Managed Care – PPO | Source: Home / Self Care

## 2014-05-28 DIAGNOSIS — B372 Candidiasis of skin and nail: Secondary | ICD-10-CM

## 2014-05-28 DIAGNOSIS — K130 Diseases of lips: Secondary | ICD-10-CM

## 2014-05-28 MED ORDER — CLOTRIMAZOLE 1 % EX CREA: TOPICAL_CREAM | CUTANEOUS | Status: DC

## 2014-05-28 MED ORDER — NYSTATIN 100000 UNIT/GM EX POWD: CUTANEOUS | Status: DC

## 2014-05-28 NOTE — ED Notes (Signed)
Pt reports rash under left breast onset 01/15 Also c/o intermittent yeast inf since 01/15 Has been to [facility] w/no signs of acute distress.

## 2014-05-28 NOTE — ED Provider Notes (Signed)
Medical screening examination/treatment/procedure(s) were performed by non-physician practitioner and as supervising physician I was immediately available for consultation/collaboration.  Philipp Deputy, M.D.  Harden Mo, MD 05/28/14 [id number]

## 2014-05-28 NOTE — ED Provider Notes (Signed)
CSN: [id number]     Arrival date & time 05/28/14  1449 History   First MD Initiated Contact with Patient 05/28/14 1456     Chief Complaint  Patient presents with  . Rash  . Vaginitis   (Consider location/radiation/quality/duration/timing/severity/associated sxs/prior Treatment) HPI Comments: 33 year old morbidly obese female complaining of a recurrent "yeast" rash beneath the breast and in the inguinal folds. She has been seen several times for this by various urgent cares. They will give her creams and feels that will temporarily abate the rash but he continues to return. She states it is itchy and sometimes slimy. She has a history of hypertension, SLE, cardiomyopathy secondary to #2, CHF, pulmonary hypertension and CK D.   Past Medical History  Diagnosis Date  . Unspecified essential hypertension   . Unspecified deficiency anemia   . Systemic lupus erythematosus   . Secondary cardiomyopathy, unspecified   . ERYTHEMATOSUS, LUPUS 08/07/2006  . CHF (congestive heart failure) 5-6 yrs ago  hosp  . Chronic kidney disease   . Intestinal infection due to Clostridium difficile    History reviewed. No pertinent past surgical history. Family History  Problem Relation Age of Onset  . Lupus Mother   . Arthritis Maternal Grandmother   . Cancer Maternal Grandmother   . Prostate cancer Paternal Grandfather   . Cancer Paternal Grandfather   . Diabetes Cousin    History  Substance Use Topics  . Smoking status: Never Smoker   . Smokeless tobacco: Never Used  . Alcohol Use: Yes     Comment: socially   OB History   Grav Para Term Preterm Abortions TAB SAB Ect Mult Living                 Review of Systems  Constitutional: Negative.   Skin: Positive for rash.  All other systems reviewed and are negative.   Allergies  Lisinopril and Nsaids  Home Medications   Prior to Admission medications   Medication Sig Start Date End Date Taking? Authorizing Provider  allopurinol (ZYLOPRIM)  300 MG tablet TAKE 1 TABLET BY MOUTH DAILY    Bruce Lemmie Evens Swords, MD  amLODipine (NORVASC) 10 MG tablet Take 10 mg by mouth daily.    Historical Provider, MD  clotrimazole (LOTRIMIN) 1 % cream Apply to groin rash 2 times daily 05/28/14   Janne Napoleon, NP  EPINEPHrine (EPIPEN) 0.3 mg/0.3 mL SOAJ Inject 0.3 mLs (0.3 mg total) into the muscle once. 05/25/13   Thao P Le, DO  furosemide (LASIX) 20 MG tablet Take 20 mg by mouth daily.  11/17/13   Historical Provider, MD  hydroxychloroquine (PLAQUENIL) 200 MG tablet TAKE 1 TABLET BY MOUTH TWICE A DAY    Darrick Penna Swords, MD  metoprolol (LOPRESSOR) 100 MG tablet Take 200 mg by mouth 2 (two) times daily.    Historical Provider, MD  nystatin (MYCOSTATIN/NYSTOP) 100000 UNIT/GM POWD Apply powder to areas of rash tid. 05/28/14   Janne Napoleon, NP  predniSONE (DELTASONE) 10 MG tablet Take 4t po qd X 1wk, then 3T po qd X 1wk, then 2T po qd X 1wk, then 1T po qd X 1 wk, then 1/2 T po qd X 1 wk. 12/22/13   Irene Pap, NP  valACYclovir (VALTREX) 1000 MG tablet Take 1,000 mg by mouth daily.  10/29/13   Historical Provider, MD   BP 153/98  Pulse 85  Temp(Src) 98.9 F (37.2 C) (Oral)  Resp 16  SpO2 99%  LMP 05/06/2014 Physical Exam  Nursing note  and vitals reviewed. Constitutional: She is oriented to person, place, and time. She appears well-developed and well-nourished. No distress.  Neck: Normal range of motion. Neck supple.  Cardiovascular: Normal rate.   Pulmonary/Chest: Effort normal. No respiratory distress.  Neurological: She is alert and oriented to person, place, and time.  Skin: Skin is warm and dry.  Well marginated area of moist erythema within the skin fold beneath the left breast and between the left and right breast. There is a similar type rash to the bilateral inguinal folds. There is no evidence of vaginal discharge.  Psychiatric: She has a normal mood and affect.    ED Course  Procedures (including critical care time) Labs Review Labs Reviewed - No data  to display  Imaging Review No results found.   MDM   1. Intertrigo labialis   2. Candida infection of flexural skin     Lamisil to breast intertrigo Mycelex for genital fungal rash Nystatin ppowder. Dry.      Janne Napoleon, NP 05/28/14 1538  Janne Napoleon, NP 05/28/14 [phone]

## 2014-05-28 NOTE — Discharge Instructions (Signed)
Candida Infection Use lamisil cream to areas under breast bid for 2 weeks, then periodically. Use clotrimazole for genital rash.  A Candida infection (also called yeast, fungus, and Monilia infection) is an overgrowth of yeast that can occur anywhere on the body. A yeast infection commonly occurs in warm, moist body areas. Usually, the infection remains localized but can spread to become a systemic infection. A yeast infection may be a sign of a more severe disease such as diabetes, leukemia, or AIDS. A yeast infection can occur in both men and women. In women, Candida vaginitis is a vaginal infection. It is one of the most common causes of vaginitis. Men usually do not have symptoms or know they have an infection until other problems develop. Men may find out they have a yeast infection because their sex partner has a yeast infection. Uncircumcised men are more likely to get a yeast infection than circumcised men. This is because the uncircumcised glans is not exposed to air and does not remain as dry as that of a circumcised glans. Older adults may develop yeast infections around dentures. CAUSES  Women  Antibiotics.  Steroid medication taken for a long time.  Being overweight (obese).  Diabetes.  Poor immune condition.  Certain serious medical conditions.  Immune suppressive medications for organ transplant patients.  Chemotherapy.  Pregnancy.  Menstruation.  Stress and fatigue.  Intravenous drug use.  Oral contraceptives.  Wearing tight-fitting clothes in the crotch area.  Catching it from a sex partner who has a yeast infection.  Spermicide.  Intravenous, urinary, or other catheters. Men  Catching it from a sex partner who has a yeast infection.  Having oral or anal sex with a person who has the infection.  Spermicide.  Diabetes.  Antibiotics.  Poor immune system.  Medications that suppress the immune system.  Intravenous drug use.  Intravenous,  urinary, or other catheters. SYMPTOMS  Women  Thick, white vaginal discharge.  Vaginal itching.  Redness and swelling in and around the vagina.  Irritation of the lips of the vagina and perineum.  Blisters on the vaginal lips and perineum.  Painful sexual intercourse.  Low blood sugar (hypoglycemia).  Painful urination.  Bladder infections.  Intestinal problems such as constipation, indigestion, bad breath, bloating, increase in gas, diarrhea, or loose stools. Men  Men may develop intestinal problems such as constipation, indigestion, bad breath, bloating, increase in gas, diarrhea, or loose stools.  Dry, cracked skin on the penis with itching or discomfort.  Jock itch.  Dry, flaky skin.  Athlete's foot.  Hypoglycemia. DIAGNOSIS  Women  A history and an exam are performed.  The discharge may be examined under a microscope.  A culture may be taken of the discharge. Men  A history and an exam are performed.  Any discharge from the penis or areas of cracked skin will be looked at under the microscope and cultured.  Stool samples may be cultured. TREATMENT  Women  Vaginal antifungal suppositories and creams.  Medicated creams to decrease irritation and itching on the outside of the vagina.  Warm compresses to the perineal area to decrease swelling and discomfort.  Oral antifungal medications.  Medicated vaginal suppositories or cream for repeated or recurrent infections.  Wash and dry the irritation areas before applying the cream.  Eating yogurt with Lactobacillus may help with prevention and treatment.  Sometimes painting the vagina with gentian violet solution may help if creams and suppositories do not work. Men  Antifungal creams and oral antifungal medications.  Sometimes treatment must continue for 30 days after the symptoms go away to prevent recurrence. HOME CARE INSTRUCTIONS  Women  Use cotton underwear and avoid tight-fitting  clothing.  Avoid colored, scented toilet paper and deodorant tampons or pads.  Do not douche.  Keep your diabetes under control.  Finish all the prescribed medications.  Keep your skin clean and dry.  Consume milk or yogurt with Lactobacillus-active culture regularly. If you get frequent yeast infections and think that is what the infection is, there are over-the-counter medications that you can get. If the infection does not show healing in 3 days, talk to your caregiver.  Tell your sex partner you have a yeast infection. Your partner may need treatment also, especially if your infection does not clear up or recurs. Men  Keep your skin clean and dry.  Keep your diabetes under control.  Finish all prescribed medications.  Tell your sex partner that you have a yeast infection so he or she can be treated if necessary. SEEK MEDICAL CARE IF:   Your symptoms do not clear up or worsen in one week after treatment.  You have an oral temperature above 102 F (38.9 C).  You have trouble swallowing or eating for a prolonged time.  You develop blisters on and around your vagina.  You develop vaginal bleeding and it is not your menstrual period.  You develop abdominal pain.  You develop intestinal problems as mentioned above.  You get weak or light-headed.  You have painful or increased urination.  You have pain during sexual intercourse. MAKE SURE YOU:   Understand these instructions.  Will watch your condition.  Will get help right away if you are not doing well or get worse. Document Released: 11/16/2004 Document Revised: 02/23/2014 Document Reviewed: 02/28/2010 [facility] Patient Information 2015 [place], [place]. This information is not intended to replace advice given to you by your health care provider. Make sure you discuss any questions you have with your health care provider.  Intertrigo Intertrigo is a skin condition that occurs in between folds of skin in places  on the body that rub together a lot and do not get much ventilation. It is caused by heat, moisture, friction, sweat retention, and lack of air circulation, which produces red, irritated patches and, sometimes, scaling or drainage. People who have diabetes, who are obese, or who have treatment with antibiotics are at increased risk for intertrigo. The most common sites for intertrigo to occur include:  The groin.  The breasts.  The armpits.  Folds of abdominal skin.  Webbed spaces between the fingers or toes. Intertrigo may be aggravated by:  Sweat.  Feces.  Yeast or bacteria that are present near skin folds.  Urine.  Vaginal discharge. HOME CARE INSTRUCTIONS  The following steps can be taken to reduce friction and keep the affected area cool and dry:  Expose skin folds to the air.  Keep deep skin folds separated with cotton or linen cloth. Avoid tight fitting clothing that could cause chafing.  Wear open-toed shoes or sandals to help reduce moisture between the toes.  Apply absorbent powders to affected areas as directed by your caregiver.  Apply over-the-counter barrier pastes, such as zinc oxide, as directed by your caregiver.  If you develop a fungal infection in the affected area, your caregiver may have you use antifungal creams. SEEK MEDICAL CARE IF:   The rash is not improving after 1 week of treatment.  The rash is getting worse (more red, more swollen,  more painful, or spreading).  You have a fever or chills. MAKE SURE YOU:   Understand these instructions.  Will watch your condition.  Will get help right away if you are not doing well or get worse. Document Released: 10/09/2005 Document Revised: 01/01/2012 Document Reviewed: 03/24/2010 [facility] Patient Information 2015 [place], [place]. This information is not intended to replace advice given to you by your health care provider. Make sure you discuss any questions you have with your health care  provider.  Yeast Infection of the Skin Some yeast on the skin is normal, but sometimes it causes an infection. If you have a yeast infection, it shows up as white or light brown patches on brown skin. You can see it better in the summer on tan skin. It causes light-colored holes in your suntan. It can happen on any area of the body. This cannot be passed from person to person. HOME CARE  Scrub your skin daily with a dandruff shampoo. Your rash may take a couple weeks to get well.  Do not scratch or itch the rash. GET HELP RIGHT AWAY IF:   You get another infection from scratching. The skin may get warm, red, and may ooze fluid.  The infection does not seem to be getting better. MAKE SURE YOU:  Understand these instructions.  Will watch your condition.  Will get help right away if you are not doing well or get worse. Document Released: 09/21/2008 Document Revised: 01/01/2012 Document Reviewed: 09/21/2008 [facility] Patient Information 2015 [place], [place]. This information is not intended to replace advice given to you by your health care provider. Make sure you discuss any questions you have with your health care provider.

## 2014-06-11 ENCOUNTER — Encounter: Payer: BC Managed Care – PPO | Admitting: Family Medicine

## 2014-06-11 NOTE — Progress Notes (Signed)
Error   This encounter was created in error - please disregard. 

## 2014-06-12 ENCOUNTER — Encounter: Payer: Self-pay | Admitting: Family Medicine

## 2014-06-12 ENCOUNTER — Ambulatory Visit (INDEPENDENT_AMBULATORY_CARE_PROVIDER_SITE_OTHER): Payer: BC Managed Care – PPO | Admitting: Family Medicine

## 2014-06-12 VITALS — BP 142/98 | HR 88 | Temp 97.6°F | Ht 68.0 in | Wt 261.0 lb

## 2014-06-12 DIAGNOSIS — R21 Rash and other nonspecific skin eruption: Secondary | ICD-10-CM

## 2014-06-12 MED ORDER — FLUCONAZOLE 150 MG PO TABS: 150.0000 mg | ORAL_TABLET | Freq: Every day | ORAL | Status: DC

## 2014-06-12 NOTE — Patient Instructions (Signed)
-  We have ordered labs or studies at this visit. It can take up to 1-2 weeks for results and processing. We will contact you with instructions IF your results are abnormal. Normal results will be released to your [facility]. If you have not heard from [place] or can not find your results in [facility] in 2 weeks please contact our office.  -As we discussed, we have prescribed a new medication for you at this appointment. We discussed the common and serious potential adverse effects of this medication and you can review these and more with the pharmacist when you pick up your medication.  Please follow the instructions for use carefully and notify us immediately if you have any problems taking this medication.  -call today to schedule appointment with your rheumatolgist  -call your dermatologist if persists or worsens

## 2014-06-12 NOTE — Progress Notes (Signed)
No chief complaint on file.   HPI:  Acute visit for:  1) "Yeast Infection": -note: cancelled last minute yesterday, she report miscommunication -reports intermittent yeast rash issues for some time -treated with cream 2 weeks ago at urgent care - did not help much -itchy rash under L breast and in groin area -denies: fevers, vaginal discharge, malaise -has sores in labial area and in mouth last week, hx herpes simplex -has lupus and CKD, sees nephrologist, dermatologist, cardiologist, pulmonologist and has referral to rheum at [facility] per her report  ROS: See pertinent positives and negatives per HPI.  Past Medical History  Diagnosis Date  . Unspecified essential hypertension   . Unspecified deficiency anemia   . Systemic lupus erythematosus   . Secondary cardiomyopathy, unspecified   . ERYTHEMATOSUS, LUPUS 08/07/2006  . CHF (congestive heart failure) 5-6 yrs ago  hosp  . Chronic kidney disease   . Intestinal infection due to Clostridium difficile     No past surgical history on file.  Family History  Problem Relation Age of Onset  . Lupus Mother   . Arthritis Maternal Grandmother   . Cancer Maternal Grandmother   . Prostate cancer Paternal Grandfather   . Cancer Paternal Grandfather   . Diabetes Cousin     History   Social History  . Marital Status: Single    Spouse Name: N/A    Number of Children: 0  . 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 Smoking status: Never Smoker   . Smokeless tobacco: Never Used  . Alcohol Use: Yes     Comment: socially  . Drug Use: No  . Sexual Activity: No   Other Topics Concern  . None   Social History Narrative  . None    Current outpatient prescriptions:allopurinol (ZYLOPRIM) 300 MG tablet, TAKE 1 TABLET BY MOUTH DAILY, Disp: 90 tablet, Rfl: 3;  amLODipine (NORVASC) 10 MG tablet, Take 10 mg by mouth daily., Disp: , Rfl: ;  clotrimazole (LOTRIMIN)  1 % cream, Apply to groin rash 2 times daily, Disp: 45 g, Rfl: 0;  EPINEPHrine (EPIPEN) 0.3 mg/0.3 mL SOAJ, Inject 0.3 mLs (0.3 mg total) into the muscle once., Disp: 1 Device, Rfl: 1 furosemide (LASIX) 20 MG tablet, Take 20 mg by mouth daily. , Disp: , Rfl: ;  hydroxychloroquine (PLAQUENIL) 200 MG tablet, TAKE 1 TABLET BY MOUTH TWICE A DAY, Disp: 60 tablet, Rfl: 5;  metoprolol (LOPRESSOR) 100 MG tablet, Take 200 mg by mouth 2 (two) times daily., Disp: , Rfl: ;  mycophenolate (CELLCEPT) 500 MG tablet, Take 1,000 mg by mouth., Disp: , Rfl:  nystatin (MYCOSTATIN/NYSTOP) 100000 UNIT/GM POWD, Apply powder to areas of rash tid., Disp: 60 g, Rfl: 1;  predniSONE (DELTASONE) 10 MG tablet, Take 4t po qd X 1wk, then 3T po qd X 1wk, then 2T po qd X 1wk, then 1T po qd X 1 wk, then 1/2 T po qd X 1 wk., Disp: 75 tablet, Rfl: 0;  valACYclovir (VALTREX) 1000 MG tablet, Take 1,000 mg by mouth daily. , Disp: , Rfl:  fluconazole (DIFLUCAN) 150 MG tablet, Take 1 tablet (150 mg total) by mouth daily., Disp: 7 tablet, Rfl: 0  EXAM:  Filed Vitals:   06/12/14 1259  BP: 142/98  Pulse: 88  Temp: 97.6 F (36.4 C)    Body mass index is 39.69 kg/(m^2).  GENERAL: vitals reviewed and  listed above, alert, oriented, appears well hydrated and in no acute distress  HEENT: atraumatic, conjunttiva clear, no obvious abnormalities on inspection of external nose and ears  NECK: no obvious masses on inspection  LUNGS: clear to auscultation bilaterally, no wheezes, rales or rhonchi, good air movement  CV: HRRR, no peripheral edema  SKIN: multiple areas of patches of erythematous, scaly skin throughout on arms, neck, legs; erythematous well demarcated rash in groin area, labia bilat and under L breast with small superficial erosion  MS: moves all extremities without noticeable abnormality  PSYCH: pleasant and cooperative, no obvious depression or anxiety  ASSESSMENT AND PLAN:  Discussed the following assessment and  plan:  Rash and nonspecific skin eruption - Plan: Viral culture, fluconazole (DIFLUCAN) 150 MG tablet, RPR  -in process of transferring care to Dr. Yong Channel and has appt set up -suspect some skin issues related to her lupus, intertrigo, ? Herpes labialis - viral culture labial erosions, RPR -tx with: diflucan oral for 1 week, avoid carbs and sugars, probiotic -f/u or see dermatologist if not improving - numbers given to call -Patient advised to return or notify a doctor immediately if symptoms worsen or persist or new concerns arise.  Patient Instructions  -We have ordered labs or studies at this visit. It can take up to 1-2 weeks for results and processing. We will contact you with instructions IF your results are abnormal. Normal results will be released to your [facility]. If you have not heard from [place] or can not find your results in [facility] in 2 weeks please contact our office.  -As we discussed, we have prescribed a new medication for you at this appointment. We discussed the common and serious potential adverse effects of this medication and you can review these and more with the pharmacist when you pick up your medication.  Please follow the instructions for use carefully and notify us immediately if you have any problems taking this medication.  -call today to schedule appointment with your rheumatolgist  -call your dermatologist if persists or worsens          Erhard Senske R.

## 2014-06-12 NOTE — Progress Notes (Signed)
Pre visit review using our clinic review tool, if applicable. No additional management support is needed unless otherwise documented below in the visit note. 

## 2014-06-13 LAB — RPR

## 2014-06-16 ENCOUNTER — Telehealth: Payer: Self-pay | Admitting: Internal Medicine

## 2014-06-16 NOTE — Telephone Encounter (Signed)
Pt was seen on 06-12-14 with dr Maudie Mercury and pt would like blood work results

## 2014-06-17 LAB — VIRAL CULTURE VIRC: ORGANISM ID, BACTERIA: NEGATIVE

## 2014-06-17 NOTE — Telephone Encounter (Signed)
Patient informed of RPR results and advised the viral culture could take a few more days before the results are back.

## 2014-07-09 LAB — HM PAP SMEAR: HM PAP: NEGATIVE

## 2014-07-13 LAB — HIV 1/O/2 ABS, QUAL: HIV 1/O/2 Abs, Qual: NONREACTIVE

## 2014-07-16 ENCOUNTER — Ambulatory Visit: Payer: BC Managed Care – PPO | Admitting: Cardiology

## 2014-08-04 ENCOUNTER — Ambulatory Visit (INDEPENDENT_AMBULATORY_CARE_PROVIDER_SITE_OTHER): Payer: BC Managed Care – PPO | Admitting: Cardiology

## 2014-08-04 ENCOUNTER — Encounter: Payer: Self-pay | Admitting: Cardiology

## 2014-08-04 VITALS — BP 158/100 | HR 78 | Ht 67.0 in | Wt 270.0 lb

## 2014-08-04 DIAGNOSIS — I158 Other secondary hypertension: Secondary | ICD-10-CM

## 2014-08-04 DIAGNOSIS — I429 Cardiomyopathy, unspecified: Secondary | ICD-10-CM

## 2014-08-04 MED ORDER — CLONIDINE HCL 0.1 MG/24HR TD PTWK: 0.1000 mg | MEDICATED_PATCH | TRANSDERMAL | Status: DC

## 2014-08-04 NOTE — Progress Notes (Signed)
HPI The patient presents for follow up of a cardiomyopathy.  She had a history of nonischemic cardiomyopathy. Cardiac cath done in 2007, at the time her ejection fraction was 25% demonstrated no CAD. Followup echo in 2010 demonstrated the EF to be 55%. She has been managed for lupus with some renal insufficiency. At her first appointment with me she had increased lower extremity swelling.  I sent her for an echo.  This demonstrated a preserved EF.  However, she had severe LVH.    She presents for followup. She does say that her blood pressure has been more difficult to control. She says her renal insufficiency getting slightly worse though I don't have the most recent labs. Her blood pressure at home she thinks is inaccurate because her blood pressure cuff is too small. However, he typically runs she says with pressures are 180/110.   She does not report increased dyspnea.  She is not having chest pain.  Her weight is up.    Allergies  Allergen Reactions  . Lisinopril Swelling  . Nsaids     She should avoid all due to renal insufficiency    Current Outpatient Prescriptions  Medication Sig Dispense Refill  . allopurinol (ZYLOPRIM) 300 MG tablet TAKE 1 TABLET BY MOUTH DAILY  90 tablet  3  . amLODipine (NORVASC) 10 MG tablet Take 10 mg by mouth daily.      . clotrimazole (LOTRIMIN) 1 % cream Apply to groin rash 2 times daily  45 g  0  . EPINEPHrine (EPIPEN) 0.3 mg/0.3 mL SOAJ Inject 0.3 mLs (0.3 mg total) into the muscle once.  1 Device  1  . furosemide (LASIX) 20 MG tablet Take 20 mg by mouth daily.       . hydroxychloroquine (PLAQUENIL) 200 MG tablet TAKE 1 TABLET BY MOUTH TWICE A DAY  60 tablet  5  . metoprolol (LOPRESSOR) 100 MG tablet Take 200 mg by mouth 2 (two) times daily.      . mycophenolate (CELLCEPT) 500 MG tablet Take 1,000 mg by mouth.      . nystatin (MYCOSTATIN/NYSTOP) 100000 UNIT/GM POWD Apply powder to areas of rash tid.  60 g  1  . predniSONE (DELTASONE) 10 MG tablet Take  4t po qd X 1wk, then 3T po qd X 1wk, then 2T po qd X 1wk, then 1T po qd X 1 wk, then 1/2 T po qd X 1 wk.  75 tablet  0  . valACYclovir (VALTREX) 1000 MG tablet Take 1,000 mg by mouth daily.       . fluconazole (DIFLUCAN) 150 MG tablet Take 1 tablet (150 mg total) by mouth daily.  7 tablet  0   No current facility-administered medications for this visit.    Past Medical History  Diagnosis Date  . Unspecified essential hypertension   . Unspecified deficiency anemia   . Systemic lupus erythematosus   . Secondary cardiomyopathy, unspecified   . ERYTHEMATOSUS, LUPUS 08/07/2006  . CHF (congestive heart failure) 5-6 yrs ago  hosp  . Chronic kidney disease   . Intestinal infection due to Clostridium difficile     No past surgical history on file.  ROS:  As stated in the HPI and negative for all other systems.  PHYSICAL EXAM BP 158/100  Pulse 78  Ht 5\' 7"  (1.702 m)  Wt 270 lb (122.471 kg)  BMI 42.28 kg/m2 GENERAL:  Well appearing HEENT:  Pupils equal round and reactive, fundi not visualized, oral mucosa unremarkable NECK:  Mild  7 cm jugular venous distention at 45 degrees, waveform within normal limits, carotid upstroke brisk and symmetric, no bruits, no thyromegaly LYMPHATICS:  No cervical, inguinal adenopathy LUNGS:  Clear to auscultation bilaterally BACK:  No CVA tenderness CHEST:  Unremarkable HEART:  PMI not displaced or sustained,S1 and S2 within normal limits, no S3, no S4, no clicks, no rubs, no  murmurs ABD:  Flat, positive bowel sounds normal in frequency in pitch, no bruits, no rebound, no guarding, no midline pulsatile mass, no hepatomegaly, no splenomegaly EXT:  2 plus pulses throughout, moderate ankle edema, no cyanosis no clubbing SKIN:  No rashes no nodules NEURO:  Cranial nerves II through XII grossly intact, motor grossly intact throughout PSYCH:  Cognitively intact, oriented to person place and time   EKG:  Sinus rhythm, rate 78  and, axis within normal limits,  intervals within normal limits, no acute ST-T wave changes. 08/04/2014   ASSESSMENT AND PLAN  LVH:  She has severe left ventricular hypertrophy.  I suspect that this is likely related to difficult to control hypertension which will be addressed below. I will however have a low threshold for future MRI to consider any infiltrative process.  HTN:   I am going to add a clonidine patch.  I would not want to add hydralazine. With her renal insufficiency and avoiding ARB.  She did not tolerate ACE inhibitor in the past either. She would not remember to take it 3 times a day beta blocker such as labetalol.  I will be checking a renal artery Doppler.  EDEMA:  We discussed compression stockings and conservative treatment.   CHEST PAIN:  She has had a chronic atypical chest pain pattern.  I think that there is a low pretest probability of obstructive CAD.  At this point I am not considering further cardiovascular studies.

## 2014-08-04 NOTE — Patient Instructions (Addendum)
Your physician recommends that you schedule a follow-up appointment in: 6 weeks with Dr. Percival Spanish  We are scheduling a renal doppler  Maintain a 4 gm Sodium Diet  Bring you BP cuff here so we can correlate it for youLow-Sodium Eating Plan Sodium raises blood pressure and causes water to be held in the body. Getting less sodium from food will help lower your blood pressure, reduce any swelling, and protect your heart, liver, and kidneys. We get sodium by adding salt (sodium chloride) to food. Most of our sodium comes from canned, boxed, and frozen foods. Restaurant foods, fast foods, and pizza are also very high in sodium. Even if you take medicine to lower your blood pressure or to reduce fluid in your body, getting less sodium from your food is important. WHAT IS MY PLAN? Most people should limit their sodium intake to 2,300 mg a day. Your health care provider recommends that you limit your sodium intake to  4 grams_ a day.  WHAT DO I NEED TO KNOW ABOUT THIS EATING PLAN? For the low-sodium eating plan, you will follow these general guidelines:  Choose foods with a % Daily Value for sodium of less than 5% (as listed on the food label).   Use salt-free seasonings or herbs instead of table salt or sea salt.   Check with your health care provider or pharmacist before using salt substitutes.   Eat fresh foods.  Eat more vegetables and fruits.  Limit canned vegetables. If you do use them, rinse them well to decrease the sodium.   Limit cheese to 1 oz (28 g) per day.   Eat lower-sodium products, often labeled as "lower sodium" or "no salt added."  Avoid foods that contain monosodium glutamate (MSG). MSG is sometimes added to [place] food and some canned foods.  Check food labels (Nutrition Facts labels) on foods to learn how much sodium is in one serving.  Eat more home-cooked food and less restaurant, buffet, and fast food.  When eating at a restaurant, ask that your food be  prepared with less salt or none, if possible.  HOW DO I READ FOOD LABELS FOR SODIUM INFORMATION? The Nutrition Facts label lists the amount of sodium in one serving of the food. If you eat more than one serving, you must multiply the listed amount of sodium by the number of servings. Food labels may also identify foods as:  Sodium free--Less than 5 mg in a serving.  Very low sodium--35 mg or less in a serving.  Low sodium--140 mg or less in a serving.  Light in sodium--50% less sodium in a serving. For example, if a food that usually has 300 mg of sodium is changed to become light in sodium, it will have 150 mg of sodium.  Reduced sodium--25% less sodium in a serving. For example, if a food that usually has 400 mg of sodium is changed to reduced sodium, it will have 300 mg of sodium. WHAT FOODS CAN I EAT? Grains Low-sodium cereals, including oats, puffed wheat and rice, and shredded wheat cereals. Low-sodium crackers. Unsalted rice and pasta. Lower-sodium bread.  Vegetables Frozen or fresh vegetables. Low-sodium or reduced-sodium canned vegetables. Low-sodium or reduced-sodium tomato sauce and paste. Low-sodium or reduced-sodium tomato and vegetable juices.  Fruits Fresh, frozen, and canned fruit. Fruit juice.  Meat and Other Protein Products Low-sodium canned tuna and salmon. Fresh or frozen meat, poultry, seafood, and fish. Lamb. Unsalted nuts. Dried beans, peas, and lentils without added salt. Unsalted canned beans.  Homemade soups without salt. Eggs.  Dairy Milk. Soy milk. Ricotta cheese. Low-sodium or reduced-sodium cheeses. Yogurt.  Condiments Fresh and dried herbs and spices. Salt-free seasonings. Onion and garlic powders. Low-sodium varieties of mustard and ketchup. Lemon juice.  Fats and Oils Reduced-sodium salad dressings. Unsalted butter.  Other Unsalted popcorn and pretzels.  The items listed above may not be a complete list of recommended foods or beverages.  Contact your dietitian for more options. WHAT FOODS ARE NOT RECOMMENDED? Grains Instant hot cereals. Bread stuffing, pancake, and biscuit mixes. Croutons. Seasoned rice or pasta mixes. Noodle soup cups. Boxed or frozen macaroni and cheese. Self-rising flour. Regular salted crackers. Vegetables Regular canned vegetables. Regular canned tomato sauce and paste. Regular tomato and vegetable juices. Frozen vegetables in sauces. Salted french fries. Olives. Angie Fava. Relishes. Sauerkraut. Salsa. Meat and Other Protein Products Salted, canned, smoked, spiced, or pickled meats, seafood, or fish. Bacon, ham, sausage, hot dogs, corned beef, chipped beef, and packaged luncheon meats. Salt pork. Jerky. Pickled herring. Anchovies, regular canned tuna, and sardines. Salted nuts. Dairy Processed cheese and cheese spreads. Cheese curds. Blue cheese and cottage cheese. Buttermilk.  Condiments Onion and garlic salt, seasoned salt, table salt, and sea salt. Canned and packaged gravies. Worcestershire sauce. Tartar sauce. Barbecue sauce. Teriyaki sauce. Soy sauce, including reduced sodium. Steak sauce. Fish sauce. Oyster sauce. Cocktail sauce. Horseradish. Regular ketchup and mustard. Meat flavorings and tenderizers. Bouillon cubes. Hot sauce. Tabasco sauce. Marinades. Taco seasonings. Relishes. Fats and Oils Regular salad dressings. Salted butter. Margarine. Ghee. Bacon fat.  Other Potato and tortilla chips. Corn chips and puffs. Salted popcorn and pretzels. Canned or dried soups. Pizza. Frozen entrees and pot pies.  The items listed above may not be a complete list of foods and beverages to avoid. Contact your dietitian for more information. Document Released: 03/31/2002 Document Revised: 10/14/2013 Document Reviewed: 08/13/2013 [facility] Patient Information 2015 [place], [place]. This information is not intended to replace advice given to you by your health care provider. Make sure you discuss any questions  you have with your health care provider.

## 2014-08-10 ENCOUNTER — Ambulatory Visit (HOSPITAL_COMMUNITY)
Admission: RE | Admit: 2014-08-10 | Discharge: 2014-08-10 | Disposition: A | Discharge status: Home / Self Care | Payer: BC Managed Care – PPO | Source: Ambulatory Visit | Attending: Cardiology | Admitting: Cardiology

## 2014-08-10 DIAGNOSIS — I429 Cardiomyopathy, unspecified: Secondary | ICD-10-CM

## 2014-08-10 DIAGNOSIS — I158 Other secondary hypertension: Secondary | ICD-10-CM | POA: Diagnosis not present

## 2014-08-10 DIAGNOSIS — I10 Essential (primary) hypertension: Secondary | ICD-10-CM

## 2014-08-10 NOTE — Progress Notes (Signed)
Renal Artery Duplex Completed. °Brianna L Mazza,RVT °

## 2014-08-18 ENCOUNTER — Ambulatory Visit: Payer: BC Managed Care – PPO | Admitting: Cardiology

## 2014-09-29 ENCOUNTER — Ambulatory Visit (INDEPENDENT_AMBULATORY_CARE_PROVIDER_SITE_OTHER): Payer: BC Managed Care – PPO | Admitting: Cardiology

## 2014-09-29 VITALS — BP 228/148 | HR 98 | Ht 67.0 in | Wt 273.9 lb

## 2014-09-29 DIAGNOSIS — I10 Essential (primary) hypertension: Secondary | ICD-10-CM

## 2014-09-29 DIAGNOSIS — R0609 Other forms of dyspnea: Secondary | ICD-10-CM | POA: Insufficient documentation

## 2014-09-29 DIAGNOSIS — R0600 Dyspnea, unspecified: Secondary | ICD-10-CM

## 2014-09-29 MED ORDER — CLONIDINE HCL 0.2 MG/24HR TD PTWK: 0.2000 mg | MEDICATED_PATCH | TRANSDERMAL | Status: DC

## 2014-09-29 NOTE — Patient Instructions (Signed)
Your physician recommends that you schedule a follow-up appointment in: 2 weeks with Dr. Percival Spanish  We are increasing your clonidine patch to 0.2 mg/weekly  We are ordering a sleep study for you to get done

## 2014-09-29 NOTE — Progress Notes (Signed)
HPI The patient presents for follow up of a cardiomyopathy.  She had a history of nonischemic cardiomyopathy. Cardiac cath done in 2007, at the time her ejection fraction was 25% demonstrated no CAD. Followup echo in 2010 demonstrated the EF to be 55%. She has been managed for lupus with some renal insufficiency. She has severe LVH.    Her blood pressure has been difficult to control. However, renal Doppler at the last visit demonstrated no evidence of renal artery stenosis.   I did add a clonidine patch to her meds at the last visit.   She said she puts on the clonidine patch at night  because it makes her tired. Her blood pressures typically 150 over100 or so at home.   She is tired during the day.  She continues to have dyspnea with moderate exertion. She's not describing any PND or orthopnea. She has had no new palpitations, presyncope or syncope.     Allergies  Allergen Reactions  . Lisinopril Swelling  . Nsaids     She should avoid all due to renal insufficiency    Current Outpatient Prescriptions  Medication Sig Dispense Refill  . allopurinol (ZYLOPRIM) 300 MG tablet TAKE 1 TABLET BY MOUTH DAILY 90 tablet 3  . cloNIDine (CATAPRES - DOSED IN MG/24 HR) 0.1 mg/24hr patch Place 1 patch (0.1 mg total) onto the skin once a week. 4 patch 12  . clotrimazole (LOTRIMIN) 1 % cream Apply to groin rash 2 times daily 45 g 0  . desonide (DESOWEN) 0.05 % ointment Apply 1 application topically once a week.  1  . diltiazem (TIAZAC) 360 MG 24 hr capsule Take 1 capsule by mouth daily.    Marland Kitchen EPINEPHrine (EPIPEN) 0.3 mg/0.3 mL SOAJ Inject 0.3 mLs (0.3 mg total) into the muscle once. 1 Device 1  . furosemide (LASIX) 20 MG tablet Take 20 mg by mouth daily.     . hydroxychloroquine (PLAQUENIL) 200 MG tablet TAKE 1 TABLET BY MOUTH TWICE A DAY 60 tablet 5  . metoprolol (LOPRESSOR) 100 MG tablet Take 200 mg by mouth 2 (two) times daily.    . mycophenolate (CELLCEPT) 500 MG tablet Take 1,000 mg by mouth.    .  predniSONE (DELTASONE) 10 MG tablet Take 4t po qd X 1wk, then 3T po qd X 1wk, then 2T po qd X 1wk, then 1T po qd X 1 wk, then 1/2 T po qd X 1 wk. 75 tablet 0  . valACYclovir (VALTREX) 1000 MG tablet Take 1,000 mg by mouth daily.      No current facility-administered medications for this visit.    Past Medical History  Diagnosis Date  . Unspecified essential hypertension   . Unspecified deficiency anemia   . Systemic lupus erythematosus   . Secondary cardiomyopathy, unspecified   . ERYTHEMATOSUS, LUPUS 08/07/2006  . CHF (congestive heart failure) 5-6 yrs ago  hosp  . Chronic kidney disease   . Intestinal infection due to Clostridium difficile     No past surgical history on file.  ROS:  As stated in the HPI and negative for all other systems.  PHYSICAL EXAM Ht 5\' 7"  (1.702 m)  Wt 273 lb 14.4 oz (124.24 kg)  BMI 42.89 kg/m2 GENERAL:  Well appearing HEENT:  Pupils equal round and reactive, fundi not visualized, oral mucosa unremarkable NECK:  Mild  7 cm jugular venous distention at 45 degrees, waveform within normal limits, carotid upstroke brisk and symmetric, no bruits, no thyromegaly LYMPHATICS:  No  cervical, inguinal adenopathy LUNGS:  Clear to auscultation bilaterally BACK:  No CVA tenderness CHEST:  Unremarkable HEART:  PMI not displaced or sustained,S1 and S2 within normal limits, no S3, positive  S4, no clicks, no rubs, no  murmurs ABD:  Flat, positive bowel sounds normal in frequency in pitch, no bruits, no rebound, no guarding, no midline pulsatile mass, no hepatomegaly, no splenomegaly EXT:  2 plus pulses throughout, moderate ankle edema, no cyanosis no clubbing SKIN:  No rashes no nodules NEURO:  Cranial nerves II through XII grossly intact, motor grossly intact throughout PSYCH:  Cognitively intact, oriented to person place and time   ASSESSMENT AND PLAN  LVH:  She has severe left ventricular hypertrophy.  I suspect that this is likely related to difficult to  control hypertension which will be addressed below. I will however have a low threshold for future MRI to consider any infiltrative process in the future.   HTN:   I will increase the Catapress patch to number 2.  I would not want to add hydralazine with her history of lupus. With her renal insufficiency I am trying to avoid ARB.  She did not tolerate ACE inhibitor in the past either. She would not remember to take it 3 times a day beta blocker such as labetalol.  I will be checking a renal artery Doppler.  I will also order a sleep study.   She has snoring difficult to control hypertension daytime somnolence and headaches. All  EDEMA:  We discussed compression stockings and conservative treatment.   CHEST PAIN:  She has had a chronic atypical chest pain pattern.  I think that there is a low pretest probability of obstructive CAD.  At this point I am not considering further cardiovascular studies.

## 2014-09-30 ENCOUNTER — Encounter: Payer: Self-pay | Admitting: Cardiology

## 2014-10-14 ENCOUNTER — Ambulatory Visit (INDEPENDENT_AMBULATORY_CARE_PROVIDER_SITE_OTHER): Payer: BC Managed Care – PPO | Admitting: Cardiology

## 2014-10-14 ENCOUNTER — Encounter: Payer: Self-pay | Admitting: Cardiology

## 2014-10-14 VITALS — BP 213/149 | HR 102 | Ht 67.0 in | Wt 268.1 lb

## 2014-10-14 DIAGNOSIS — I10 Essential (primary) hypertension: Secondary | ICD-10-CM

## 2014-10-14 MED ORDER — LABETALOL HCL 100 MG PO TABS: 200.0000 mg | ORAL_TABLET | Freq: Three times a day (TID) | ORAL | Status: DC

## 2014-10-14 NOTE — Patient Instructions (Addendum)
Your physician recommends that you schedule a follow-up appointment in: 6 months with Dr. Percival Spanish  We are adding Labetalol 200 mg to be taken three times a day  Stop taking your metoprolol

## 2014-10-14 NOTE — Progress Notes (Signed)
HPI The patient presents for follow up of a cardiomyopathy.  She had a history of nonischemic cardiomyopathy. Cardiac cath done in 2007, at the time her ejection fraction was 25% demonstrated no CAD. Followup echo in 2010 demonstrated the EF to be 55%. She has been managed for lupus with some renal insufficiency. She has severe LVH.      Most recently I have been seeing her for very difficult to control blood pressure has been difficult to control. Renal Doppler ademonstrated no evidence of renal artery stenosis.   I did increase the clonidine patch  at the last visit.   She has not been taking her BP at home.  The patient denies any new symptoms such as chest discomfort, neck or arm discomfort. There has been no new shortness of breath, PND or orthopnea. There have been no reported palpitations, presyncope or syncope    Allergies  Allergen Reactions  . Lisinopril Swelling  . Nsaids     She should avoid all due to renal insufficiency    Current Outpatient Prescriptions  Medication Sig Dispense Refill  . allopurinol (ZYLOPRIM) 300 MG tablet TAKE 1 TABLET BY MOUTH DAILY 90 tablet 3  . cloNIDine (CATAPRES - DOSED IN MG/24 HR) 0.2 mg/24hr patch Place 1 patch (0.2 mg total) onto the skin once a week. 4 patch 12  . desonide (DESOWEN) 0.05 % ointment Apply 1 application topically once a week.  1  . diltiazem (TIAZAC) 360 MG 24 hr capsule Take 1 capsule by mouth daily.    Marland Kitchen EPINEPHrine (EPIPEN) 0.3 mg/0.3 mL SOAJ Inject 0.3 mLs (0.3 mg total) into the muscle once. 1 Device 1  . furosemide (LASIX) 20 MG tablet Take 20 mg by mouth daily.     . hydroxychloroquine (PLAQUENIL) 200 MG tablet TAKE 1 TABLET BY MOUTH TWICE A DAY 60 tablet 5  . metoprolol (LOPRESSOR) 100 MG tablet Take 200 mg by mouth 2 (two) times daily.    . mycophenolate (CELLCEPT) 500 MG tablet Take 1,000 mg by mouth.    . predniSONE (DELTASONE) 10 MG tablet Take 4t po qd X 1wk, then 3T po qd X 1wk, then 2T po qd X 1wk, then 1T po qd  X 1 wk, then 1/2 T po qd X 1 wk. 75 tablet 0  . valACYclovir (VALTREX) 1000 MG tablet Take 1,000 mg by mouth daily.      No current facility-administered medications for this visit.    Past Medical History  Diagnosis Date  . Unspecified essential hypertension   . Unspecified deficiency anemia   . Systemic lupus erythematosus   . Secondary cardiomyopathy, unspecified   . ERYTHEMATOSUS, LUPUS 08/07/2006  . CHF (congestive heart failure) 5-6 yrs ago  hosp  . Chronic kidney disease   . Intestinal infection due to Clostridium difficile     No past surgical history on file.  ROS:  As stated in the HPI and negative for all other systems.  PHYSICAL EXAM BP 213/149 mmHg  Pulse 102  Ht 5\' 7"  (1.702 m)  Wt 268 lb 1.6 oz (121.609 kg)  BMI 41.98 kg/m2 GENERAL:  Well appearing HEENT:  Pupils equal round and reactive, fundi not visualized, oral mucosa unremarkable NECK:  Mild  7 cm jugular venous distention at 45 degrees, waveform within normal limits, carotid upstroke brisk and symmetric, no bruits, no thyromegaly LYMPHATICS:  No cervical, inguinal adenopathy LUNGS:  Clear to auscultation bilaterally BACK:  No CVA tenderness CHEST:  Unremarkable HEART:  PMI  not displaced or sustained,S1 and S2 within normal limits, no S3, positive  S4, no clicks, no rubs, no  murmurs ABD:  Flat, positive bowel sounds normal in frequency in pitch, no bruits, no rebound, no guarding, no midline pulsatile mass, no hepatomegaly, no splenomegaly EXT:  2 plus pulses throughout, moderate ankle edema, no cyanosis no clubbing SKIN:  No rashes no nodules NEURO:  Cranial nerves II through XII grossly intact, motor grossly intact throughout PSYCH:  Cognitively intact, oriented to person place and time   ASSESSMENT AND PLAN  LVH:  She has severe left ventricular hypertrophy.  I suspect that this is likely related to difficult to control hypertension which will be addressed below. I will however have a low threshold  for future MRI to consider any infiltrative process in the future.   HTN:  I would not want to add hydralazine with her history of lupus. With her renal insufficiency I am trying to avoid ARB.  She did not tolerate ACE inhibitor in the past either. Although she thinks she would have some difficulty taking it 3 times a day drug from going to refer to look a lot 200 mg 3 times daily and continue the other meds as listed. She is to get a sleep study in mid January.  Of note we kept her in the office today to getting her by mouth clonidine to bring her blood pressure down. It did come down as recorded.  She was able to leave the office.  EDEMA:  We discussed compression stockings and conservative treatment.   CHEST PAIN:  She has had a chronic atypical chest pain pattern.  I think that there is a low pretest probability of obstructive CAD.  At this point I am not considering further cardiovascular studies.

## 2014-10-24 ENCOUNTER — Other Ambulatory Visit: Payer: Self-pay | Admitting: Internal Medicine

## 2014-11-03 ENCOUNTER — Encounter (HOSPITAL_BASED_OUTPATIENT_CLINIC_OR_DEPARTMENT_OTHER): Payer: BC Managed Care – PPO

## 2014-12-02 ENCOUNTER — Encounter: Payer: Self-pay | Admitting: Family Medicine

## 2014-12-02 ENCOUNTER — Ambulatory Visit (INDEPENDENT_AMBULATORY_CARE_PROVIDER_SITE_OTHER): Payer: BLUE CROSS/BLUE SHIELD | Admitting: Family Medicine

## 2014-12-02 VITALS — BP 150/90 | Temp 98.5°F | Wt 270.0 lb

## 2014-12-02 DIAGNOSIS — B351 Tinea unguium: Secondary | ICD-10-CM

## 2014-12-02 DIAGNOSIS — I10 Essential (primary) hypertension: Secondary | ICD-10-CM

## 2014-12-02 NOTE — Progress Notes (Signed)
Garret Reddish, MD Phone: [phone]  Subjective:  Patient presents today to establish care with me as their new primary care provider. Patient was formerly a patient of Dr. Leanne Chang. Chief complaint-noted.   Onychomycosis/thickened nail- new issue -Had toenail removed about a year ago, a few months after it started growing back in, the end of the nail became thickened and would crumble. No pain. She is worried about ingrown toenail occuring.  ROS- no redness/erythema/pain at nailbed.   Hypertension-much improved control now on clonidine, diltiazem, lasix, labetalol, hydralazine  BP Readings from Last 3 Encounters:  12/02/14 150/90  10/14/14 213/149  09/29/14 228/148  Home BP monitoring-no Compliant with medications-yes without side effects, but misses one dose of labetalol per day ROS-Denies any CP, HA.   The following were reviewed and entered/updated in epic: Past Medical History  Diagnosis Date  . Unspecified essential hypertension   . Unspecified deficiency anemia   . Systemic lupus erythematosus   . Secondary cardiomyopathy, unspecified   . ERYTHEMATOSUS, LUPUS 08/07/2006  . CHF (congestive heart failure) 5-6 yrs ago  hosp  . Chronic kidney disease   . Intestinal infection due to Clostridium difficile    Patient Active Problem List   Diagnosis Date Noted  . Essential hypertension 09/22/2010    Priority: High  . Secondary cardiomyopathy 07/14/2009    Priority: High  . LUPUS NEPHRITIS 07/14/2009    Priority: High  . ERYTHEMATOSUS, LUPUS 08/07/2006    Priority: High  . Gout 08/14/2012    Priority: Medium  . Pulmonary hypertension, secondary 07/28/2010    Priority: Medium  . History of TIA (transient ischemic attack) 07/14/2009    Priority: Medium  . Onychomycosis 12/02/2014    Priority: Low  . Anemia 08/07/2006    Priority: Low   Past Surgical History  Procedure Laterality Date  . Kidney biopsies      to determine lupus nephritis    Family History    Problem Relation Age of Onset  . Lupus Mother   . Cancer Maternal Grandmother     unknown  . Prostate cancer Paternal Grandfather   . Diabetes Brother     cousin    Medications- reviewed and updated Current Outpatient Prescriptions  Medication Sig Dispense Refill  . allopurinol (ZYLOPRIM) 300 MG tablet TAKE 1 TABLET BY MOUTH DAILY 90 tablet 3  . calcitRIOL (ROCALTROL) 0.25 MCG capsule Take 0.25 mcg by mouth 3 (three) times a week.    . cloNIDine (CATAPRES - DOSED IN MG/24 HR) 0.2 mg/24hr patch Place 1 patch (0.2 mg total) onto the skin once a week. 4 patch 12  . diltiazem (TIAZAC) 360 MG 24 hr capsule Take 1 capsule by mouth daily.    . furosemide (LASIX) 20 MG tablet Take 20 mg by mouth daily.     . hydrALAZINE (APRESOLINE) 50 MG tablet Take 50 mg by mouth 3 (three) times daily.    . hydroxychloroquine (PLAQUENIL) 200 MG tablet TAKE 1 TABLET BY MOUTH TWICE A DAY 60 tablet 1  . labetalol (NORMODYNE) 100 MG tablet Take 2 tablets (200 mg total) by mouth 3 (three) times daily. 270 tablet 3  . mycophenolate (CELLCEPT) 500 MG tablet Take 1,000 mg by mouth.    . predniSONE (DELTASONE) 10 MG tablet Take 4t po qd X 1wk, then 3T po qd X 1wk, then 2T po qd X 1wk, then 1T po qd X 1 wk, then 1/2 T po qd X 1 wk. 75 tablet 0  . desonide (DESOWEN) 0.05 %  ointment Apply 1 application topically once a week.  1  . EPINEPHrine (EPIPEN) 0.3 mg/0.3 mL SOAJ Inject 0.3 mLs (0.3 mg total) into the muscle once. (Patient not taking: Reported on 12/02/2014) 1 Device 1  . valACYclovir (VALTREX) 1000 MG tablet Take 1,000 mg by mouth daily.      No current facility-administered medications for this visit.    Allergies-reviewed and updated Allergies  Allergen Reactions  . Lisinopril Swelling  . Nsaids     She should avoid all due to renal insufficiency    History   Social History  . Marital Status: Single    Spouse Name: N/A  . Number of Children: 0  . Years of Education: N/A   Occupational History   . Fish farm manager   . SALES    Social History Main Topics  . Smoking status: Never Smoker   . Smokeless tobacco: Never Used  . Alcohol Use: 0.0 oz/week    0 Standard drinks or equivalent per week     Comment: socially 2x a month  . Drug Use: No  . Sexual Activity: No   Other Topics Concern  . Not on file   Social History Narrative   Lives with sister.       Works at [employer]: cooking, watch movies, time with friends    ROS--See HPI   Objective: BP 150/90 mmHg  Temp(Src) 98.5 F (36.9 C)  Wt 270 lb (122.471 kg) Gen: NAD, resting comfortably in chair CV: RRR no murmurs rubs or gallops Lungs: CTAB no crackles, wheeze, rhonchi Abdomen: soft/nontender/nondistended/normal bowel sounds.  Ext: no edema Skin: warm, dry L Great toe raised, thickened, crumbling.  Neuro: grossly normal, moves all extremities, PERRLA, normal gait   Assessment/Plan:  Onychomycosis Last creatinine at [facility] was >3. With likely very low GFR  would not suggest terbinafine. Griseofulvin with hematologic effects questionable with her lupus. use in this GFR range also not clearly beneficial. We discussed topicals but low success rate. Discussed considering removing toenail, she would like to discuss this and other  with podiatry and have referred at this time.    Essential hypertension Continue clonidine, diltiazem, lasix, laetalol, hydralazine. Managed primarily by renal. BPs much better than before. ARB not suggested by renal, we will continue at current doses given improved control and follow up with renal.     Return precautions advised. Patient will primarily follow prn with at least yearly visits here. She is managed by Dr. Joelyn Oms Renal Dr. Percival Spanish Cards Dr. [facility]  Orders Placed This Encounter  Procedures  . Ambulatory referral to Podiatry    Referral Priority:  Routine    Referral Type:  Consultation    Referral  Reason:  Specialty Services Required    Requested Specialty:  Podiatry    Number of Visits Requested:  1

## 2014-12-02 NOTE — Assessment & Plan Note (Signed)
Last creatinine at [facility] was >3. With likely very low GFR  would not suggest terbinafine. Griseofulvin with hematologic effects questionable with her lupus. use in this GFR range also not clearly beneficial. We discussed topicals but low success rate. Discussed considering removing toenail, she would like to discuss this and other  with podiatry and have referred at this time.

## 2014-12-02 NOTE — Patient Instructions (Signed)
Really nice to meet you  Glad blood pressure is better  Referred to podiatry. I am going to ask Keba to send a copy of my note so they can be aware of my concerns with some of the more common agents used.

## 2014-12-02 NOTE — Assessment & Plan Note (Signed)
Continue clonidine, diltiazem, lasix, laetalol, hydralazine. Managed primarily by renal. BPs much better than before. ARB not suggested by renal, we will continue at current doses given improved control and follow up with renal.

## 2014-12-23 ENCOUNTER — Encounter: Payer: Self-pay | Admitting: Podiatry

## 2014-12-23 ENCOUNTER — Ambulatory Visit (INDEPENDENT_AMBULATORY_CARE_PROVIDER_SITE_OTHER): Payer: BLUE CROSS/BLUE SHIELD | Admitting: Podiatry

## 2014-12-23 VITALS — BP 146/90 | HR 95 | Resp 18

## 2014-12-23 DIAGNOSIS — L608 Other nail disorders: Secondary | ICD-10-CM | POA: Diagnosis not present

## 2014-12-23 DIAGNOSIS — B351 Tinea unguium: Secondary | ICD-10-CM

## 2014-12-23 NOTE — Patient Instructions (Signed)

## 2014-12-23 NOTE — Progress Notes (Signed)
   Subjective:    Patient ID: [name] [name], female    DOB: 29-Mar-1981, 34 y.o.   MRN: [id number]  HPI  34 year old female presents the office today with complaints of nail discoloration and thickening. She states that his been ongoing for approximately 3 months. She said no prior treatment. She denies any redness or drainage along the nail sites she denies any pain associated with this time. She denies any history of injury or trauma to the area or any tingling or numbness. No other complaints at this time.   Review of Systems  Skin:       COLOR CHANGE IN NAILS  Neurological: Positive for headaches.  Hematological: Bruises/bleeds easily.  All other systems reviewed and are negative.      Objective:   Physical Exam AAO 3, NAD DP/PT pulses palpable, CRT less than 3 seconds Protective sensation intact with Simms Weinstein monofilament, vibratory sensation intact, Achilles tendon reflex intact. Nails are hypertrophic, dystrophic, discolored 10. There is no surrounding erythema or drainage on the nail sites. There is no tenderness to palpation upon the nails at this time. No open lesions or pre-ulcer lesions identified bilaterally. No interdigital maceration. No other areas of tenderness bilateral lower extremity is. No overlying erythema, edema, increase in warmth. No pain with calf compression, swelling, warmth, erythema.      Assessment & Plan:  34 year old female with likely onychomycosis Treatment options were discussed the patient include alternatives, risks, complications. At today's appointment the nails were biopsied and sent to [facility] last for evaluation of onychomycosis. I discussed the patient various treatment options for this however will await the results the biopsy before proceeding. Will call her once the results of the biopsy are obtained.

## 2014-12-24 ENCOUNTER — Ambulatory Visit (HOSPITAL_BASED_OUTPATIENT_CLINIC_OR_DEPARTMENT_OTHER): Payer: BLUE CROSS/BLUE SHIELD | Attending: Cardiology | Admitting: *Deleted

## 2014-12-24 VITALS — Ht 67.0 in | Wt 275.0 lb

## 2014-12-24 DIAGNOSIS — G4730 Sleep apnea, unspecified: Secondary | ICD-10-CM | POA: Diagnosis present

## 2014-12-24 DIAGNOSIS — R0600 Dyspnea, unspecified: Secondary | ICD-10-CM | POA: Diagnosis not present

## 2014-12-24 DIAGNOSIS — I10 Essential (primary) hypertension: Secondary | ICD-10-CM | POA: Insufficient documentation

## 2014-12-24 DIAGNOSIS — G4733 Obstructive sleep apnea (adult) (pediatric): Secondary | ICD-10-CM

## 2014-12-25 NOTE — Sleep Study (Signed)
The patient wears a wig. It is easily removable. She did not realize she would have to remove it for the study and was visibly uncomfortable having to be exposed. It may be better to have a female tech again if she does need to return.

## 2014-12-31 ENCOUNTER — Ambulatory Visit (INDEPENDENT_AMBULATORY_CARE_PROVIDER_SITE_OTHER): Payer: BLUE CROSS/BLUE SHIELD | Admitting: Family Medicine

## 2014-12-31 ENCOUNTER — Encounter: Payer: Self-pay | Admitting: Family Medicine

## 2014-12-31 VITALS — BP 142/96 | HR 75 | Temp 98.8°F | Ht 67.0 in | Wt 258.6 lb

## 2014-12-31 DIAGNOSIS — IMO0002 Reserved for concepts with insufficient information to code with codable children: Secondary | ICD-10-CM

## 2014-12-31 DIAGNOSIS — J209 Acute bronchitis, unspecified: Secondary | ICD-10-CM

## 2014-12-31 DIAGNOSIS — I10 Essential (primary) hypertension: Secondary | ICD-10-CM

## 2014-12-31 DIAGNOSIS — L930 Discoid lupus erythematosus: Secondary | ICD-10-CM

## 2014-12-31 DIAGNOSIS — I272 Other secondary pulmonary hypertension: Secondary | ICD-10-CM

## 2014-12-31 DIAGNOSIS — J069 Acute upper respiratory infection, unspecified: Secondary | ICD-10-CM

## 2014-12-31 MED ORDER — AZITHROMYCIN 250 MG PO TABS: ORAL_TABLET | ORAL | Status: DC

## 2014-12-31 NOTE — Progress Notes (Signed)
HPI:  URI: -started: 4 days ago -symptoms:nasal congestion, sore throat, cough, mild SOB, fevers initially - now better -denies:fever for the last few days, SOB today, NVD, tooth pain, sinus pain, hemoptysis, CP -has tried: nothing -sick contacts/travel/risks: denies flu exposure, tick exposure or or Ebola risks -note: assistant had difficulty with pulse ox and had multiple error readings on check in  ROS: See pertinent positives and negatives per HPI.  Past Medical History  Diagnosis Date  . Unspecified essential hypertension   . Unspecified deficiency anemia   . Systemic lupus erythematosus   . Secondary cardiomyopathy, unspecified   . ERYTHEMATOSUS, LUPUS 08/07/2006  . CHF (congestive heart failure) 5-6 yrs ago  hosp  . Chronic kidney disease   . Intestinal infection due to Clostridium difficile     Past Surgical History  Procedure Laterality Date  . Kidney biopsies      to determine lupus nephritis    Family History  Problem Relation Age of Onset  . Lupus Mother   . Cancer Maternal Grandmother     unknown  . Prostate cancer Paternal Grandfather   . Diabetes Brother     cousin    History   Social History  . Marital Status: Single    Spouse Name: N/A  . Number of Children: 0  . 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 Smoking status: Never Smoker   . Smokeless tobacco: Never Used  . Alcohol Use: 0.0 oz/week    0 Standard drinks or equivalent per week     Comment: socially 2x a month  . Drug Use: No  . Sexual Activity: No   Other Topics Concern  . None   Social History Narrative   Lives with sister.       Works at [employer]: cooking, watch movies, time with friends     Current outpatient prescriptions:  .  allopurinol (ZYLOPRIM) 300 MG tablet, TAKE 1 TABLET BY MOUTH DAILY, Disp: 90 tablet, Rfl: 3 .  calcitRIOL (ROCALTROL) 0.25 MCG capsule,  Take 0.25 mcg by mouth 3 (three) times a week., Disp: , Rfl:  .  cloNIDine (CATAPRES - DOSED IN MG/24 HR) 0.2 mg/24hr patch, Place 1 patch (0.2 mg total) onto the skin once a week., Disp: 4 patch, Rfl: 12 .  desonide (DESOWEN) 0.05 % ointment, Apply 1 application topically once a week., Disp: , Rfl: 1 .  diltiazem (TIAZAC) 360 MG 24 hr capsule, Take 1 capsule by mouth daily., Disp: , Rfl:  .  EPINEPHrine (EPIPEN) 0.3 mg/0.3 mL SOAJ, Inject 0.3 mLs (0.3 mg total) into the muscle once., Disp: 1 Device, Rfl: 1 .  furosemide (LASIX) 20 MG tablet, Take 20 mg by mouth daily. , Disp: , Rfl:  .  hydrALAZINE (APRESOLINE) 50 MG tablet, Take 50 mg by mouth 3 (three) times daily., Disp: , Rfl:  .  hydroxychloroquine (PLAQUENIL) 200 MG tablet, TAKE 1 TABLET BY MOUTH TWICE A DAY, Disp: 60 tablet, Rfl: 1 .  labetalol (NORMODYNE) 100 MG tablet, Take 2 tablets (200 mg total) by mouth 3 (three) times daily., Disp: 270 tablet, Rfl: 3 .  mycophenolate (CELLCEPT) 500 MG tablet, Take 1,000 mg by mouth., Disp: , Rfl:  .  predniSONE (DELTASONE) 10 MG tablet, Complete 15mg  daily for 2 weeks, then 10mg  daily, Disp: , Rfl:  .  valACYclovir (  VALTREX) 1000 MG tablet, Take 1,000 mg by mouth daily. , Disp: , Rfl:  .  azithromycin (ZITHROMAX) 250 MG tablet, 2 tabs first day then 1 tab daily, Disp: 6 tablet, Rfl: 0  EXAM:  Filed Vitals:   12/31/14 1415  BP: 142/96  Pulse: 75  Temp: 98.8 F (37.1 C)    Body mass index is 40.49 kg/(m^2).  GENERAL: vitals reviewed and listed above, alert, oriented, appears well hydrated and in no acute distress  HEENT: atraumatic, conjunttiva clear, no obvious abnormalities on inspection of external nose and ears, normal appearance of ear canals and TMs, clear nasal congestion, mild post oropharyngeal erythema with PND, no tonsillar edema or exudate, no sinus TTP  NECK: no obvious masses on inspection  LUNGS: few scattered wheezes, O2 98% on my exam with working pulse ox, no signs of  resp distress  CV: HRRR, no peripheral edema  MS: moves all extremities without noticeable abnormality  PSYCH: pleasant and cooperative, no obvious depression or anxiety  ASSESSMENT AND PLAN:  Discussed the following assessment and plan:  Acute upper respiratory infection  ERYTHEMATOSUS, LUPUS  Essential hypertension  Pulmonary hypertension, secondary  Acute bronchitis, unspecified organism  -given HPI and exam findings today, a serious infection or illness is unlikely. We discussed potential etiologies, with VURI with mild bronchitis being most likely, and advised prednisone burst, supportive care and monitoring. We discussed treatment side effects, likely course, antibiotic misuse, transmission, and signs of developing a serious illness. -no signs or symptoms of SBI today, however, given her chronic conditions and and wheezing we did send her home with and antibiotic to take if worseneing or not improving given increased risk SBI -follow up with PCP when well to recheck BP -of course, we advised to return or notify a doctor immediately if symptoms worsen or persist or new concerns arise.    Patient Instructions  30mg  prednisone daily for 4 days then back to normal dose  Start the zpack and complete if worsening or not improving over next few days  Follow up if trouble breathing, worsening new symptoms or concerns or not improving as expected     KIM, HANNAH R.

## 2014-12-31 NOTE — Progress Notes (Signed)
Pre visit review using our clinic review tool, if applicable. No additional management support is needed unless otherwise documented below in the visit note. 

## 2014-12-31 NOTE — Patient Instructions (Signed)
30mg  prednisone daily for 4 days then back to normal dose  Start the zpack and complete if worsening or not improving over next few days  Follow up if trouble breathing, worsening new symptoms or concerns or not improving as expected

## 2015-01-01 ENCOUNTER — Telehealth: Payer: Self-pay | Admitting: Family Medicine

## 2015-01-01 NOTE — Telephone Encounter (Signed)
emmi emailed °

## 2015-01-05 NOTE — Sleep Study (Signed)
NAME: [name] [name] DATE OF BIRTH:  1981/06/10 MEDICAL RECORD NUMBER [id number]  LOCATION: [facility]  PHYSICIAN: KELLY,THOMAS A  DATE OF STUDY: 12/24/2014  SLEEP STUDY TYPE: Nocturnal Polysomnogram               REFERRING PHYSICIAN: Minus Breeding, MD  INDICATION FOR STUDY:  [name] [name] is a 34 year old female with a history of hypertension, prior cardiomyopathy, severe left ventricular hypertrophy, who is referred for sleep study to evaluate for sleep apnea.  The patient admits to daytime sleepiness, nonrestorative sleep, and teeth grinding.  EPWORTH SLEEPINESS SCORE:  10 HEIGHT: 5\' 7"  (170.2 cm)  WEIGHT: 275 lb (124.739 kg)    Body mass index is 43.06 kg/(m^2).  NECK SIZE: 15.25 in.  MEDICATIONS:   valACYclovir (VALTREX) 1000 MG tablet 1,000 mg, Daily  Note: Received from: External Pharmacy Received Sig: (Written 11/26/2013 0842)  predniSONE (DELTASONE) 10 MG tablet  Note: Received from: [facility] (Written 12/23/2014 0825)  mycophenolate (CELLCEPT) 500 MG tablet 1,000 mg  Note: Received from: [facility] (Written 06/12/2014 1301)  labetalol (NORMODYNE) 100 MG tablet 200 mg, 3 times daily hydroxychloroquine (PLAQUENIL) 200 MG tablet hydrALAZINE (APRESOLINE) 50 MG tablet 50 mg, 3 times daily furosemide (LASIX) 20 MG tablet 20 mg, Daily  Note: Received from: External Pharmacy Received Sig: (Written 11/26/2013 0842)  EPINEPHrine (EPIPEN) 0.3 mg/0.3 mL SOAJ 0.3 mg, Once diltiazem (TIAZAC) 360 MG 24 hr capsule 1 capsule, Daily  Note: Received from: External Pharmacy Received Sig: (Written 09/29/2014 1539)  desonide (DESOWEN) 0.05 % ointment 1 application, Weekly  Note: Received from: External Pharmacy Received Sig: (Written 09/29/2014 1539)  cloNIDine (CATAPRES - DOSED IN MG/24 HR) 0.2 mg/24hr patch 0.2 mg, Weekly calcitRIOL (ROCALTROL) 0.25 MCG capsule 0.25 mcg, 3 times weekly azithromycin (ZITHROMAX) 250 MG tablet  allopurinol (ZYLOPRIM) 300 MG tablet   SLEEP ARCHITECTURE:  The patient slept for 324.5 minutes out of a sleep period of time of 346.5 minutes, giving a percent sleep efficiency at 86.6%.  Onset to sleep was normal at 26.5 minutes.  Latency to REM sleep was reduced at 55 minutes.  The patient slept for 15.5 minutes (4.8% in stage I, 219 minutes (67.5% in stage II, 0 minutes in stage III, and 90 (27.75) minutes in REM sleep.  Sleep architecture was abnormal with absence of slow-wave sleep.  The patient slept 211 minutes or 65% in supine sleep.  There are total of 77 arousals with an index of 14.2.  There were several episodes of probable bruxism.  RESPIRATORY DATA:  During the sleep period time, the patient had 0 apneas and 85, hypotony is.  The overall apnea plus hypotony index (AHI) is 15.7, and respiratory disturbance index (RDI) is 16.5, which places a category.  The patient in the category of moderate obstructive sleep apnea.  However, events were severe during REM sleep with an AHI of 54.0.  There was a positional component with events more pronounced with supine sleep.  OXYGEN DATA:  The baseline oxygen saturation was 98%.  The lowest oxygen saturation with non-REM sleep was 91% and with REM sleep was 82%.  CARDIAC DATA: Average heart rate was 70 bpm with sinus rhythm.  MOVEMENT/PARASOMNIA: There were 0 periodic limb movements.  IMPRESSION/ RECOMMENDATION:   Moderate obstructive sleep apnea/hypopnea syndrome with events worse both in the supine position, and severe during REM sleep. Respiratory events with oxygen desaturation to a nadir of 82% with REM sleep. Mild to  moderate snoring. Probable bruxism. The arousal index was abnormal.  CPAP titration is recommended in light of the severity of the patient's sleep apnea/I hypopnea syndrome in this patient with cardiovascular comorbidities.  Efforts should be made to optimize nasal and oral pharyngeal patency.   The patient should be  counseled in both good sleep hygiene as well as weight loss. The patient should be counseled to try avoiding sleep in the supine position. Consider mouth guard for possible bruxism.   [place], American Board of Sleep Medicine  ELECTRONICALLY SIGNED ON:  01/05/2015, 6:31 PM [place] PH: [phone]   FX: [phone] [place]

## 2015-01-05 NOTE — Addendum Note (Signed)
Addended by: Shelva Majestic A on: 01/05/2015 06:49 PM   Modules accepted: Level of Service

## 2015-01-11 ENCOUNTER — Encounter: Payer: Self-pay | Admitting: Podiatry

## 2015-01-12 ENCOUNTER — Telehealth: Payer: Self-pay | Admitting: *Deleted

## 2015-01-12 NOTE — Telephone Encounter (Signed)
Please make sure that the patient knows that the study was positive for sleep apnea. Call the primary and send results to PCP    ----- Message -----    From: Troy Sine, MD    Sent: 01/05/2015  6:46 PM     To: Minus Breeding, MD, Marin Olp, MD    pt aware of results  ? Schedule follow up new pt appt with dr Claiborne Billings?

## 2015-01-14 ENCOUNTER — Other Ambulatory Visit: Payer: Self-pay | Admitting: *Deleted

## 2015-01-14 ENCOUNTER — Telehealth: Payer: Self-pay | Admitting: *Deleted

## 2015-01-14 DIAGNOSIS — G4733 Obstructive sleep apnea (adult) (pediatric): Secondary | ICD-10-CM

## 2015-01-14 NOTE — Telephone Encounter (Signed)
I was calling to discuss her sleep study results.  Dr Percival Spanish would like to order a CPAP titration study.  Lm for patient to call

## 2015-01-15 ENCOUNTER — Other Ambulatory Visit: Payer: Self-pay

## 2015-01-15 MED ORDER — LABETALOL HCL 200 MG PO TABS: 200.0000 mg | ORAL_TABLET | Freq: Three times a day (TID) | ORAL | Status: DC

## 2015-01-18 ENCOUNTER — Telehealth: Payer: Self-pay | Admitting: Cardiology

## 2015-01-18 NOTE — Telephone Encounter (Signed)
Spoke with [name] [name] and informed him that Rx was sent in on 01/15/15 for 90 day supply.

## 2015-01-18 NOTE — Telephone Encounter (Signed)
Gwyndolyn Saxon called in wanting to get a verbal for a 90 day supply for the pt's Labatolol. Please call back  Thanks

## 2015-01-26 ENCOUNTER — Telehealth: Payer: Self-pay | Admitting: *Deleted

## 2015-01-26 NOTE — Telephone Encounter (Signed)
Left message to return a call to discuss sleep study results and recommendations. 

## 2015-01-29 ENCOUNTER — Other Ambulatory Visit: Payer: Self-pay | Admitting: *Deleted

## 2015-01-29 ENCOUNTER — Telehealth: Payer: Self-pay | Admitting: *Deleted

## 2015-01-29 DIAGNOSIS — G4733 Obstructive sleep apnea (adult) (pediatric): Secondary | ICD-10-CM

## 2015-01-29 NOTE — Telephone Encounter (Signed)
Informed patient of sleep study results and recommendations. CPAP titration study ordered.

## 2015-02-03 ENCOUNTER — Telehealth: Payer: Self-pay | Admitting: Podiatry

## 2015-02-03 NOTE — Telephone Encounter (Signed)
Needs RX called in to [facility] on [address].

## 2015-02-04 ENCOUNTER — Telehealth: Payer: Self-pay

## 2015-02-12 ENCOUNTER — Telehealth: Payer: Self-pay | Admitting: Podiatry

## 2015-02-12 NOTE — Telephone Encounter (Signed)
Patient called stating she called last week needing a refill of her medication to be sent to [facility] on [address]. She states she never received a call from our office.

## 2015-02-12 NOTE — Telephone Encounter (Signed)
I informed pt the toenail results were available and Dr. Jacqualyn Posey would like her to make an appt.  Pt states she will call later to schedule.

## 2015-03-12 ENCOUNTER — Encounter (HOSPITAL_BASED_OUTPATIENT_CLINIC_OR_DEPARTMENT_OTHER): Payer: BLUE CROSS/BLUE SHIELD

## 2015-05-03 ENCOUNTER — Ambulatory Visit (INDEPENDENT_AMBULATORY_CARE_PROVIDER_SITE_OTHER): Payer: BLUE CROSS/BLUE SHIELD | Admitting: Gastroenterology

## 2015-05-03 ENCOUNTER — Other Ambulatory Visit: Payer: BLUE CROSS/BLUE SHIELD

## 2015-05-03 ENCOUNTER — Encounter: Payer: Self-pay | Admitting: Gastroenterology

## 2015-05-03 VITALS — BP 140/96 | HR 84 | Ht 67.0 in | Wt 259.6 lb

## 2015-05-03 DIAGNOSIS — A0472 Enterocolitis due to Clostridium difficile, not specified as recurrent: Secondary | ICD-10-CM

## 2015-05-03 DIAGNOSIS — A047 Enterocolitis due to Clostridium difficile: Secondary | ICD-10-CM

## 2015-05-03 DIAGNOSIS — R197 Diarrhea, unspecified: Secondary | ICD-10-CM | POA: Insufficient documentation

## 2015-05-03 MED ORDER — DICYCLOMINE HCL 20 MG PO TABS: 20.0000 mg | ORAL_TABLET | Freq: Four times a day (QID) | ORAL | Status: DC

## 2015-05-03 NOTE — Progress Notes (Signed)
Reviewed and agree with management plan.  Dawn Convery T. Dominic Rhome, MD FACG 

## 2015-05-03 NOTE — Patient Instructions (Addendum)
We have sent the following medications to your pharmacy for you to pick up at your convenience: Bentyl You may use over the counter probiotic such as:  Florastor, align, or restora. Follow up with [name] [name] on 05/21/15 at 9:30 am.

## 2015-05-03 NOTE — Progress Notes (Signed)
05/03/2015 [name] [name] [id number] [birth year]   HISTORY OF PRESENT ILLNESS:  This is a 34 year old female who is previously known to Dr. Sharlett Iles.  She was seen by him in 08/2012 when she had Cdiff colitis.  She had failed treatment with flagyl at that time and was then treated with vancomycin.  She presents to our office today with complaints of diarrhea for the past 3 weeks.  Says that she was on a Z-pack about 3 or so months ago but no other antibiotics and no travel.  She says that the diarrhea occurs 3-4 times each morning and then again 3-4 times in the evenings.  She tries not to go during the day because she is at work but does get the sensation that she needs to move her bowels.  Has lower abdominal cramping.  She is on Cellcept and prednisone for her lupus nephritis; has been on cellcept for the past 2 years.  She admits to seeing some bright red blood on the toilet paper maybe once a month or so for the past 6 months when she was having normal bowel movements.  No bloody diarrhea or bleeding currently.   Past Medical History  Diagnosis Date  . Unspecified essential hypertension   . Unspecified deficiency anemia   . Systemic lupus erythematosus   . Secondary cardiomyopathy, unspecified   . ERYTHEMATOSUS, LUPUS 08/07/2006  . CHF (congestive heart failure) 5-6 yrs ago  hosp  . Chronic kidney disease   . Intestinal infection due to Clostridium difficile    Past Surgical History  Procedure Laterality Date  . Kidney biopsies      to determine lupus nephritis    reports that she has never smoked. She has never used smokeless tobacco. She reports that she drinks alcohol. She reports that she does not use illicit drugs. family history includes Cancer in her maternal grandmother; Diabetes in her brother and another family member; Lupus in her mother; Prostate cancer in her paternal grandfather. Allergies  Allergen Reactions  . Lisinopril Swelling  . Nsaids     She should  avoid all due to renal insufficiency      Outpatient Encounter Prescriptions as of 05/03/2015  Medication Sig  . allopurinol (ZYLOPRIM) 300 MG tablet TAKE 1 TABLET BY MOUTH DAILY  . calcitRIOL (ROCALTROL) 0.25 MCG capsule Take 0.25 mcg by mouth 3 (three) times a week.  . cloNIDine (CATAPRES - DOSED IN MG/24 HR) 0.2 mg/24hr patch Place 1 patch (0.2 mg total) onto the skin once a week.  . desonide (DESOWEN) 0.05 % ointment Apply 1 application topically once a week.  . diltiazem (TIAZAC) 360 MG 24 hr capsule Take 1 capsule by mouth daily.  Marland Kitchen EPINEPHrine (EPIPEN) 0.3 mg/0.3 mL SOAJ Inject 0.3 mLs (0.3 mg total) into the muscle once.  . furosemide (LASIX) 20 MG tablet Take 20 mg by mouth daily.   . hydrALAZINE (APRESOLINE) 50 MG tablet Take 50 mg by mouth 3 (three) times daily.  . hydroxychloroquine (PLAQUENIL) 200 MG tablet TAKE 1 TABLET BY MOUTH TWICE A DAY  . labetalol (NORMODYNE) 200 MG tablet Take 1 tablet (200 mg total) by mouth 3 (three) times daily.  . mycophenolate (CELLCEPT) 500 MG tablet Take 1,000 mg by mouth.  . predniSONE (DELTASONE) 10 MG tablet Complete 15mg  daily for 2 weeks, then 10mg  daily  . valACYclovir (VALTREX) 1000 MG tablet Take 1,000 mg by mouth daily.   . [DISCONTINUED] azithromycin (ZITHROMAX) 250 MG tablet 2 tabs  first day then 1 tab daily   No facility-administered encounter medications on file as of 05/03/2015.     REVIEW OF SYSTEMS  : All other systems reviewed and negative except where noted in the History of Present Illness.   PHYSICAL EXAM: BP 150/98 mmHg  Pulse 84  Ht 5\' 7"  (1.702 m)  Wt 259 lb 9.6 oz (117.754 kg)  BMI 40.65 kg/m2  LMP 04/30/2015 General: Well developed black female in no acute distress Head: Normocephalic and atraumatic Eyes:  Sclerae anicteric, conjunctiva pink. Ears: Normal auditory acuity Lungs: Clear throughout to auscultation Heart: Regular rate and rhythm Abdomen: Soft, obese, non-distended.  Normal bowel sounds.  Minimal  diffuse TTP without R/R/G. Musculoskeletal: Symmetrical with no gross deformities  Skin: No lesions on visible extremities Extremities: No edema  Neurological: Alert oriented x 4, grossly non-focal Psychological:  Alert and cooperative. Normal mood and affect  ASSESSMENT AND PLAN: -Diarrhea of 3 weeks duration in a patient with history of Cdiff colitis:  Rule out Cdiff vs other infectious gastroenteritis; will check stool GI pathogen panel.  Will try Bentyl 20 mg BID to help with the cramping and start a daily probiotic, preferably Florastor.  If stool studies negative and diarrhea persists then may need colonoscopy as she is on cellcept, which can cause medication induced colitis.  She will follow-up 2-3 weeks.  CC:  Marin Olp, MD

## 2015-05-05 ENCOUNTER — Other Ambulatory Visit: Payer: BLUE CROSS/BLUE SHIELD

## 2015-05-05 DIAGNOSIS — R197 Diarrhea, unspecified: Secondary | ICD-10-CM

## 2015-05-06 LAB — OVA AND PARASITE EXAMINATION: OP: NONE SEEN

## 2015-05-12 LAB — OTHER SOLSTAS TEST: Miscellaneous Test: 65008

## 2015-05-17 ENCOUNTER — Telehealth: Payer: Self-pay | Admitting: Gastroenterology

## 2015-05-17 NOTE — Telephone Encounter (Signed)
Please let her know that the stool studies were negative.  How is she feeling with the Bentyl and the probiotic?  She has a follow-up appointment this week at which time we can discuss and schedule colonoscopy if she is still having any significant diarrhea.  Thank you,  Jess

## 2015-05-17 NOTE — Telephone Encounter (Signed)
Spoke with pt and she is aware. States she never got the bentyl and will try it now. Pt states she will call back to reschedule appt.

## 2015-05-17 NOTE — Telephone Encounter (Signed)
Pt calling for lab results, looks like they are from 05/05/15. Please advise.

## 2015-05-21 ENCOUNTER — Ambulatory Visit: Payer: BLUE CROSS/BLUE SHIELD | Admitting: Gastroenterology

## 2015-06-07 ENCOUNTER — Other Ambulatory Visit: Payer: Self-pay | Admitting: Internal Medicine

## 2015-06-08 ENCOUNTER — Encounter (HOSPITAL_COMMUNITY)
Admission: RE | Admit: 2015-06-08 | Discharge: 2015-06-08 | Disposition: A | Discharge status: Home / Self Care | Payer: BLUE CROSS/BLUE SHIELD | Source: Ambulatory Visit | Attending: Nephrology | Admitting: Nephrology

## 2015-06-08 DIAGNOSIS — D631 Anemia in chronic kidney disease: Secondary | ICD-10-CM | POA: Insufficient documentation

## 2015-06-08 DIAGNOSIS — Z5181 Encounter for therapeutic drug level monitoring: Secondary | ICD-10-CM | POA: Insufficient documentation

## 2015-06-08 DIAGNOSIS — N184 Chronic kidney disease, stage 4 (severe): Secondary | ICD-10-CM | POA: Insufficient documentation

## 2015-06-08 DIAGNOSIS — Z79899 Other long term (current) drug therapy: Secondary | ICD-10-CM | POA: Insufficient documentation

## 2015-06-08 LAB — POCT HEMOGLOBIN-HEMACUE: Hemoglobin: 8 g/dL — ABNORMAL LOW (ref 12.0–15.0)

## 2015-06-08 MED ORDER — DARBEPOETIN ALFA 100 MCG/0.5ML IJ SOSY
100.0000 ug | PREFILLED_SYRINGE | INTRAMUSCULAR | Status: DC
  Administered 2015-06-08: 100 ug via SUBCUTANEOUS

## 2015-06-08 MED ORDER — DARBEPOETIN ALFA 100 MCG/0.5ML IJ SOSY
PREFILLED_SYRINGE | INTRAMUSCULAR | Status: AC
  Filled 2015-06-08: qty 0.5

## 2015-06-08 NOTE — Discharge Instructions (Signed)
Darbepoetin Alfa injection What is this medicine? DARBEPOETIN ALFA (dar be POE e tin AL fa) helps your body make more red blood cells. It is used to treat anemia caused by chronic kidney failure and chemotherapy. This medicine may be used for other purposes; ask your health care provider or pharmacist if you have questions. COMMON BRAND NAME(S): Aranesp What should I tell my health care provider before I take this medicine? They need to know if you have any of these conditions: -blood clotting disorders or history of blood clots -cancer patient not on chemotherapy -cystic fibrosis -heart disease, such as angina, heart failure, or a history of a heart attack -hemoglobin level of 12 g/dL or greater -high blood pressure -low levels of folate, iron, or vitamin B12 -seizures -an unusual or allergic reaction to darbepoetin, erythropoietin, albumin, hamster proteins, latex, other medicines, foods, dyes, or preservatives -pregnant or trying to get pregnant -breast-feeding How should I use this medicine? This medicine is for injection into a vein or under the skin. It is usually given by a health care professional in a hospital or clinic setting. If you get this medicine at home, you will be taught how to prepare and give this medicine. Do not shake the solution before you withdraw a dose. Use exactly as directed. Take your medicine at regular intervals. Do not take your medicine more often than directed. It is important that you put your used needles and syringes in a special sharps container. Do not put them in a trash can. If you do not have a sharps container, call your pharmacist or healthcare provider to get one. Talk to your pediatrician regarding the use of this medicine in children. While this medicine may be used in children as young as 1 year for selected conditions, precautions do apply. Overdosage: If you think you have taken too much of this medicine contact a poison control center or  emergency room at once. NOTE: This medicine is only for you. Do not share this medicine with others. What if I miss a dose? If you miss a dose, take it as soon as you can. If it is almost time for your next dose, take only that dose. Do not take double or extra doses. What may interact with this medicine? Do not take this medicine with any of the following medications: -epoetin alfa This list may not describe all possible interactions. Give your health care provider a list of all the medicines, herbs, non-prescription drugs, or dietary supplements you use. Also tell them if you smoke, drink alcohol, or use illegal drugs. Some items may interact with your medicine. What should I watch for while using this medicine? Visit your prescriber or health care professional for regular checks on your progress and for the needed blood tests and blood pressure measurements. It is especially important for the doctor to make sure your hemoglobin level is in the desired range, to limit the risk of potential side effects and to give you the best benefit. Keep all appointments for any recommended tests. Check your blood pressure as directed. Ask your doctor what your blood pressure should be and when you should contact him or her. As your body makes more red blood cells, you may need to take iron, folic acid, or vitamin B supplements. Ask your doctor or health care provider which products are right for you. If you have kidney disease continue dietary restrictions, even though this medication can make you feel better. Talk with your doctor or health   care professional about the foods you eat and the vitamins that you take. What side effects may I notice from receiving this medicine? Side effects that you should report to your doctor or health care professional as soon as possible: -allergic reactions like skin rash, itching or hives, swelling of the face, lips, or tongue -breathing problems -changes in vision -chest  pain -confusion, trouble speaking or understanding -feeling faint or lightheaded, falls -high blood pressure -muscle aches or pains -pain, swelling, warmth in the leg -rapid weight gain -severe headaches -sudden numbness or weakness of the face, arm or leg -trouble walking, dizziness, loss of balance or coordination -seizures (convulsions) -swelling of the ankles, feet, hands -unusually weak or tired Side effects that usually do not require medical attention (report to your doctor or health care professional if they continue or are bothersome): -diarrhea -fever, chills (flu-like symptoms) -headaches -nausea, vomiting -redness, stinging, or swelling at site where injected This list may not describe all possible side effects. Call your doctor for medical advice about side effects. You may report side effects to FDA at 1-800-FDA-1088. Where should I keep my medicine? Keep out of the reach of children. Store in a refrigerator between 2 and 8 degrees C (36 and 46 degrees F). Do not freeze. Do not shake. Throw away any unused portion if using a single-dose vial. Throw away any unused medicine after the expiration date. NOTE: This sheet is a summary. It may not cover all possible information. If you have questions about this medicine, talk to your doctor, pharmacist, or health care provider.  2015, Elsevier/Gold Standard. (2008-09-22 10:23:57)  

## 2015-07-06 ENCOUNTER — Inpatient Hospital Stay (HOSPITAL_COMMUNITY): Admission: RE | Admit: 2015-07-06 | Payer: BLUE CROSS/BLUE SHIELD | Source: Ambulatory Visit

## 2015-08-05 ENCOUNTER — Encounter: Payer: Self-pay | Admitting: Family Medicine

## 2015-08-05 DIAGNOSIS — Z992 Dependence on renal dialysis: Secondary | ICD-10-CM

## 2015-08-05 DIAGNOSIS — N186 End stage renal disease: Secondary | ICD-10-CM | POA: Insufficient documentation

## 2015-08-06 ENCOUNTER — Encounter (HOSPITAL_COMMUNITY)
Admission: RE | Admit: 2015-08-06 | Discharge: 2015-08-06 | Disposition: A | Discharge status: Home / Self Care | Payer: BLUE CROSS/BLUE SHIELD | Source: Ambulatory Visit | Attending: Nephrology | Admitting: Nephrology

## 2015-08-06 DIAGNOSIS — Z5181 Encounter for therapeutic drug level monitoring: Secondary | ICD-10-CM | POA: Diagnosis not present

## 2015-08-06 DIAGNOSIS — Z79899 Other long term (current) drug therapy: Secondary | ICD-10-CM | POA: Insufficient documentation

## 2015-08-06 DIAGNOSIS — N184 Chronic kidney disease, stage 4 (severe): Secondary | ICD-10-CM | POA: Insufficient documentation

## 2015-08-06 DIAGNOSIS — D631 Anemia in chronic kidney disease: Secondary | ICD-10-CM | POA: Diagnosis not present

## 2015-08-06 LAB — RENAL FUNCTION PANEL
Albumin: 3.6 g/dL (ref 3.5–5.0)
Anion gap: 9 (ref 5–15)
BUN: 62 mg/dL — ABNORMAL HIGH (ref 6–20)
CALCIUM: 8.3 mg/dL — AB (ref 8.9–10.3)
CO2: 18 mmol/L — AB (ref 22–32)
CREATININE: 5.12 mg/dL — AB (ref 0.44–1.00)
Chloride: 112 mmol/L — ABNORMAL HIGH (ref 101–111)
GFR calc non Af Amer: 10 mL/min — ABNORMAL LOW (ref >60)
GFR, EST AFRICAN AMERICAN: 12 mL/min — AB (ref >60)
GLUCOSE: 88 mg/dL (ref 65–99)
Phosphorus: 5.3 mg/dL — ABNORMAL HIGH (ref 2.5–4.6)
Potassium: 4.1 mmol/L (ref 3.5–5.1)
SODIUM: 139 mmol/L (ref 135–145)

## 2015-08-06 LAB — POCT HEMOGLOBIN-HEMACUE: Hemoglobin: 9.1 g/dL — ABNORMAL LOW (ref 12.0–15.0)

## 2015-08-06 MED ORDER — DARBEPOETIN ALFA 100 MCG/0.5ML IJ SOSY
100.0000 ug | PREFILLED_SYRINGE | INTRAMUSCULAR | Status: DC
  Administered 2015-08-06: 100 ug via SUBCUTANEOUS

## 2015-08-06 MED ORDER — DARBEPOETIN ALFA 100 MCG/0.5ML IJ SOSY
PREFILLED_SYRINGE | INTRAMUSCULAR | Status: AC
  Filled 2015-08-06: qty 0.5

## 2015-09-01 ENCOUNTER — Encounter (HOSPITAL_COMMUNITY): Payer: BLUE CROSS/BLUE SHIELD

## 2015-09-01 ENCOUNTER — Ambulatory Visit (HOSPITAL_COMMUNITY)
Admission: RE | Admit: 2015-09-01 | Discharge: 2015-09-01 | Disposition: A | Discharge status: Home / Self Care | Payer: BLUE CROSS/BLUE SHIELD | Source: Ambulatory Visit | Attending: Nephrology | Admitting: Nephrology

## 2015-09-01 DIAGNOSIS — Z79899 Other long term (current) drug therapy: Secondary | ICD-10-CM | POA: Insufficient documentation

## 2015-09-01 DIAGNOSIS — N184 Chronic kidney disease, stage 4 (severe): Secondary | ICD-10-CM | POA: Diagnosis not present

## 2015-09-01 DIAGNOSIS — Z5181 Encounter for therapeutic drug level monitoring: Secondary | ICD-10-CM | POA: Insufficient documentation

## 2015-09-01 DIAGNOSIS — D631 Anemia in chronic kidney disease: Secondary | ICD-10-CM | POA: Insufficient documentation

## 2015-09-01 LAB — RENAL FUNCTION PANEL
ALBUMIN: 3.4 g/dL — AB (ref 3.5–5.0)
ANION GAP: 12 (ref 5–15)
BUN: 59 mg/dL — AB (ref 6–20)
CALCIUM: 8.5 mg/dL — AB (ref 8.9–10.3)
CO2: 15 mmol/L — ABNORMAL LOW (ref 22–32)
CREATININE: 5.66 mg/dL — AB (ref 0.44–1.00)
Chloride: 112 mmol/L — ABNORMAL HIGH (ref 101–111)
GFR calc Af Amer: 10 mL/min — ABNORMAL LOW (ref >60)
GFR calc non Af Amer: 9 mL/min — ABNORMAL LOW (ref >60)
GLUCOSE: 100 mg/dL — AB (ref 65–99)
PHOSPHORUS: 5.7 mg/dL — AB (ref 2.5–4.6)
Potassium: 4.3 mmol/L (ref 3.5–5.1)
SODIUM: 139 mmol/L (ref 135–145)

## 2015-09-01 LAB — FERRITIN: Ferritin: 90 ng/mL (ref 11–307)

## 2015-09-01 LAB — IRON AND TIBC
Iron: 50 ug/dL (ref 28–170)
Saturation Ratios: 19 % (ref 10.4–31.8)
TIBC: 262 ug/dL (ref 250–450)
UIBC: 212 ug/dL

## 2015-09-01 LAB — POCT HEMOGLOBIN-HEMACUE: Hemoglobin: 8.9 g/dL — ABNORMAL LOW (ref 12.0–15.0)

## 2015-09-01 MED ORDER — DARBEPOETIN ALFA 100 MCG/0.5ML IJ SOSY
100.0000 ug | PREFILLED_SYRINGE | INTRAMUSCULAR | Status: DC
  Administered 2015-09-01: 100 ug via SUBCUTANEOUS

## 2015-09-01 MED ORDER — DARBEPOETIN ALFA 100 MCG/0.5ML IJ SOSY
PREFILLED_SYRINGE | INTRAMUSCULAR | Status: AC
  Filled 2015-09-01: qty 0.5

## 2015-09-02 LAB — PTH, INTACT AND CALCIUM
Calcium, Total (PTH): 8.4 mg/dL — ABNORMAL LOW (ref 8.7–10.2)
PTH: 194 pg/mL — AB (ref 15–65)

## 2015-09-28 ENCOUNTER — Other Ambulatory Visit (HOSPITAL_COMMUNITY): Payer: Self-pay | Admitting: *Deleted

## 2015-09-29 ENCOUNTER — Encounter (HOSPITAL_COMMUNITY): Payer: BLUE CROSS/BLUE SHIELD

## 2015-10-08 ENCOUNTER — Other Ambulatory Visit: Payer: Self-pay | Admitting: Cardiology

## 2015-10-08 NOTE — Telephone Encounter (Signed)
ERX

## 2015-11-05 ENCOUNTER — Ambulatory Visit (INDEPENDENT_AMBULATORY_CARE_PROVIDER_SITE_OTHER)
Admission: RE | Admit: 2015-11-05 | Discharge: 2015-11-05 | Disposition: A | Discharge status: Home / Self Care | Payer: BLUE CROSS/BLUE SHIELD | Source: Ambulatory Visit | Attending: Family Medicine | Admitting: Family Medicine

## 2015-11-05 ENCOUNTER — Encounter: Payer: Self-pay | Admitting: Family Medicine

## 2015-11-05 ENCOUNTER — Ambulatory Visit (INDEPENDENT_AMBULATORY_CARE_PROVIDER_SITE_OTHER): Payer: BLUE CROSS/BLUE SHIELD | Admitting: Family Medicine

## 2015-11-05 DIAGNOSIS — M79672 Pain in left foot: Secondary | ICD-10-CM

## 2015-11-05 NOTE — Patient Instructions (Signed)
Concern for fracture listed below. Evaluate with x-ray. Even if negative, may be reasonable to wear post op shoe for support until better. Sometimes these fractures do not show up initially on x-ray. If you are wearing shoe, icing, and still having pain in about 2 weeks- call me and i will repeat x-ray   Metatarsal Fracture A metatarsal fracture is a break in a metatarsal bone. Metatarsal bones connect your toe bones to your ankle bones. CAUSES This type of fracture may be caused by:  A sudden twisting of your foot.  A fall onto your foot.  Overuse or repetitive exercise. RISK FACTORS This condition is more likely to develop in people who:  Play contact sports.  Have a bone disease from chronic prednisone  Have a low calcium level. SYMPTOMS Symptoms of this condition include:  Pain that is worse when walking or standing.  Pain when pressing on the foot or moving the toes.  Swelling.  Bruising on the top or bottom of the foot.  A foot that appears shorter than the other one. DIAGNOSIS This condition is diagnosed with a physical exam. You may also have imaging tests, such as:  X-rays.  A CT scan.  MRI. TREATMENT Treatment for this condition depends on its severity and whether a bone has moved out of place. Treatment may involve:  Rest.  Wearing foot support such as a cast, splint, or boot for several weeks.  Using crutches.  Surgery to move bones back into the right position. Surgery is usually needed if there are many pieces of broken bone or bones that are very out of place (displaced fracture).  Physical therapy. This may be needed to help you regain full movement and strength in your foot. You will need to return to your health care provider to have X-rays taken until your bones heal. Your health care provider will look at the X-rays to make sure that your foot is healing well. HOME CARE INSTRUCTIONS  If You Have a Cast:  Do not stick anything inside the  cast to scratch your skin. Doing that increases your risk of infection.  Check the skin around the cast every day. Report any concerns to your health care provider. You may put lotion on dry skin around the edges of the cast. Do not apply lotion to the skin underneath the cast.  Keep the cast clean and dry. If You Have a Splint or a Supportive Boot:  Wear it as directed by your health care provider. Remove it only as directed by your health care provider.  Loosen it if your toes become numb and tingle, or if they turn cold and blue.  Keep it clean and dry. Bathing  Do not take baths, swim, or use a hot tub until your health care provider approves. Ask your health care provider if you can take showers. You may only be allowed to take sponge baths for bathing.  If your health care provider approves bathing and showering, cover the cast or splint with a watertight plastic bag to protect it from water. Do not let the cast or splint get wet. Managing Pain, Stiffness, and Swelling  If directed, apply ice to the injured area (if you have a splint, not a cast).  Put ice in a plastic bag.  Place a towel between your skin and the bag.  Leave the ice on for 20 minutes, 2-3 times per day.  Move your toes often to avoid stiffness and to lessen swelling.  Raise (  elevate) the injured area above the level of your heart while you are sitting or lying down. Driving  Do not drive or operate heavy machinery while taking pain medicine.  Do not drive while wearing foot support on a foot that you use for driving. Activity  Return to your normal activities as directed by your health care provider. Ask your health care provider what activities are safe for you.  Perform exercises as directed by your health care provider or physical therapist. Safety  Do not use the injured foot to support your body weight until your health care provider says that you can. Use crutches as directed by your health care  provider. General Instructions  Do not put pressure on any part of the cast or splint until it is fully hardened. This may take several hours.  Do not use any tobacco products, including cigarettes, chewing tobacco, or e-cigarettes. Tobacco can delay bone healing. If you need help quitting, ask your health care provider.  Take medicines only as directed by your health care provider.  Keep all follow-up visits as directed by your health care provider. This is important. SEEK MEDICAL CARE IF:  You have a fever.  Your cast, splint, or boot is too loose or too tight.  Your cast, splint, or boot is damaged.  Your pain medicine is not helping.  You have pain, tingling, or numbness in your foot that is not going away. SEEK IMMEDIATE MEDICAL CARE IF:  You have severe pain.  You have tingling or numbness in your foot that is getting worse.  Your foot feels cold or becomes numb.  Your foot changes color.   This information is not intended to replace advice given to you by your health care provider. Make sure you discuss any questions you have with your health care provider.   Document Released: 07/01/2002 Document Revised: 02/23/2015 Document Reviewed: 08/05/2014 Elsevier Interactive Patient Education [employer].

## 2015-11-05 NOTE — Progress Notes (Signed)
Garret Reddish, MD  Subjective:  [name] [name] is a 35 y.o. year old very pleasant female patient who presents for/with See problem oriented charting ROS- denies erythema or warmth at foot, does have swelling and pain in left foot. No fever, chills, nausea, vomiting  Past Medical History-  Patient Active Problem List   Diagnosis Date Noted  . ESRD on dialysis [facility]) 08/05/2015    Priority: High  . Essential hypertension 09/22/2010    Priority: High  . Secondary cardiomyopathy ([place]) 07/14/2009    Priority: High  . LUPUS NEPHRITIS 07/14/2009    Priority: High  . ERYTHEMATOSUS, LUPUS 08/07/2006    Priority: High  . Gout 08/14/2012    Priority: Medium  . Pulmonary hypertension, secondary ([place]) 07/28/2010    Priority: Medium  . History of TIA (transient ischemic attack) 07/14/2009    Priority: Medium  . Onychomycosis 12/02/2014    Priority: Low  . Anemia 08/07/2006    Priority: Low  . Diarrhea 05/03/2015  . Enteritis due to Clostridium difficile 05/03/2015    Medications- reviewed and updated Current Outpatient Prescriptions  Medication Sig Dispense Refill  . allopurinol (ZYLOPRIM) 300 MG tablet TAKE 1 TABLET BY MOUTH DAILY 90 tablet 2  . calcitRIOL (ROCALTROL) 0.25 MCG capsule Take 0.25 mcg by mouth 3 (three) times a week.    . desonide (DESOWEN) 0.05 % ointment Apply 1 application topically once a week.  1  . diltiazem (TIAZAC) 360 MG 24 hr capsule Take 1 capsule by mouth daily.    . hydrALAZINE (APRESOLINE) 50 MG tablet Take 50 mg by mouth 3 (three) times daily.    . hydroxychloroquine (PLAQUENIL) 200 MG tablet TAKE 1 TABLET BY MOUTH TWICE A DAY 60 tablet 1  . labetalol (NORMODYNE) 200 MG tablet Take 1 tablet (200 mg total) by mouth 3 (three) times daily. 270 tablet 3  . mycophenolate (CELLCEPT) 500 MG tablet Take 1,000 mg by mouth.    . predniSONE (DELTASONE) 10 MG tablet Complete 15mg  daily for 2 weeks, then 10mg  daily    . cloNIDine (CATAPRES - DOSED IN MG/24 HR)  0.2 mg/24hr patch Place 1 patch (0.2 mg total) onto the skin once a week. PATIENT NEEDS TO CONTACT OFFICE FOR ADDITIONAL REFILLS (Patient not taking: Reported on 11/05/2015) 4 patch 0  . EPINEPHrine (EPIPEN) 0.3 mg/0.3 mL SOAJ Inject 0.3 mLs (0.3 mg total) into the muscle once. (Patient not taking: Reported on 11/05/2015) 1 Device 1  . valACYclovir (VALTREX) 1000 MG tablet Take 1,000 mg by mouth daily. Reported on 11/05/2015     No current facility-administered medications for this visit.    Objective: BP 100/64 mmHg  Pulse 96  Temp(Src) 98.5 F (36.9 C)  Wt 231 lb (104.781 kg) Gen: NAD, resting comfortably, does walk with a limp when on left foot CV: RRR no murmurs rubs or gallops Lungs: CTAB no crackles, wheeze, rhonchi MSK: Left foot with mild swelling on top of foot. She has pain in several areas palpated along her 4th metatarsal. More minor pain on 5th metatarsal. Swelling most focal near 4th metatarsal. No pain with palpation 4th MTP joint or when I load the joint. No pain distal to MTP Ext: no edema Skin: warm, dry Neuro: grossly normal, moves all extremities  Assessment/Plan:  Left foot pain - Plan: DG Foot Complete Left S:2-3 days left foot pain at first kind of all over top of the foot now concentrated around the 4th toe near MTP joint. Minimal pain sitting. While walking has  up to 7/10 pain. Aching pain.  No injury or fall that occurred prior to this. Did have one period where walked more quickly than normal to catch up but did not have pain right after that. Iced last night and seemed to help but woke up today and continued to hurt. Cannot take nsaids for pain due to dialysis. High risk patient for fracture given chronic prednisone and kidney disease on dialysis.  A/P: we will send for x-ray to evaluate for stress fracture. May be too early to tell on films so wrote rx for post op shoe regardless. She will wear for 2 weeks and ice foot as needed. If continued issues at 2 weeks I  can repeat films or refer to ortho so she can have evaluation and treatment same day. Minimize activity on that leg including standing- prefer to sit at work  Return precautions advised.   Orders Placed This Encounter  Procedures  . DG Foot Complete Left    Standing Status: Future     Number of Occurrences:      Standing Expiration Date: 01/02/2017    Order Specific Question:  Reason for Exam (SYMPTOM  OR DIAGNOSIS REQUIRED)    Answer:  left foot pain- chronic prednisone. Concern fracture 4th metatarsal potentially    Order Specific Question:  Is the patient pregnant?    Answer:  No    Order Specific Question:  Preferred imaging location?    Answer:  [facility]

## 2015-11-12 ENCOUNTER — Encounter: Payer: Self-pay | Admitting: Family Medicine

## 2015-11-12 ENCOUNTER — Ambulatory Visit (INDEPENDENT_AMBULATORY_CARE_PROVIDER_SITE_OTHER): Payer: BLUE CROSS/BLUE SHIELD | Admitting: Family Medicine

## 2015-11-12 VITALS — BP 130/70 | HR 94 | Temp 99.1°F | Wt 236.0 lb

## 2015-11-12 DIAGNOSIS — R21 Rash and other nonspecific skin eruption: Secondary | ICD-10-CM

## 2015-11-12 DIAGNOSIS — L03119 Cellulitis of unspecified part of limb: Secondary | ICD-10-CM | POA: Diagnosis not present

## 2015-11-12 MED ORDER — DOXYCYCLINE HYCLATE 100 MG PO TABS: 100.0000 mg | ORAL_TABLET | Freq: Two times a day (BID) | ORAL | Status: DC

## 2015-11-12 NOTE — Progress Notes (Signed)
Garret Reddish, MD  Subjective:  [name] [name] is a 35 y.o. year old very pleasant female patient who presents for/with See problem oriented charting ROS- see HPI ROS  Past Medical History-  Patient Active Problem List   Diagnosis Date Noted  . ESRD on dialysis [facility]) 08/05/2015    Priority: High  . Essential hypertension 09/22/2010    Priority: High  . Secondary cardiomyopathy ([place]) 07/14/2009    Priority: High  . LUPUS NEPHRITIS 07/14/2009    Priority: High  . ERYTHEMATOSUS, LUPUS 08/07/2006    Priority: High  . Gout 08/14/2012    Priority: Medium  . Pulmonary hypertension, secondary ([place]) 07/28/2010    Priority: Medium  . History of TIA (transient ischemic attack) 07/14/2009    Priority: Medium  . Onychomycosis 12/02/2014    Priority: Low  . Anemia 08/07/2006    Priority: Low  . Diarrhea 05/03/2015  . Enteritis due to Clostridium difficile 05/03/2015    Medications- reviewed and updated Current Outpatient Prescriptions  Medication Sig Dispense Refill  . allopurinol (ZYLOPRIM) 300 MG tablet TAKE 1 TABLET BY MOUTH DAILY 90 tablet 2  . calcitRIOL (ROCALTROL) 0.25 MCG capsule Take 0.25 mcg by mouth 3 (three) times a week.    . desonide (DESOWEN) 0.05 % ointment Apply 1 application topically once a week.  1  . diltiazem (TIAZAC) 360 MG 24 hr capsule Take 1 capsule by mouth daily.    . hydrALAZINE (APRESOLINE) 50 MG tablet Take 50 mg by mouth 3 (three) times daily.    . hydroxychloroquine (PLAQUENIL) 200 MG tablet TAKE 1 TABLET BY MOUTH TWICE A DAY 60 tablet 1  . labetalol (NORMODYNE) 200 MG tablet Take 1 tablet (200 mg total) by mouth 3 (three) times daily. 270 tablet 3  . mycophenolate (CELLCEPT) 500 MG tablet Take 1,000 mg by mouth.    . predniSONE (DELTASONE) 10 MG tablet Complete 15mg  daily for 2 weeks, then 10mg  daily    . valACYclovir (VALTREX) 1000 MG tablet Take 1,000 mg by mouth daily. Reported on 11/05/2015    . cloNIDine (CATAPRES - DOSED IN MG/24 HR) 0.2  mg/24hr patch Place 1 patch (0.2 mg total) onto the skin once a week. PATIENT NEEDS TO CONTACT OFFICE FOR ADDITIONAL REFILLS (Patient not taking: Reported on 11/05/2015) 4 patch 0  . doxycycline (VIBRA-TABS) 100 MG tablet Take 1 tablet (100 mg total) by mouth 2 (two) times daily. 28 tablet 0  . EPINEPHrine (EPIPEN) 0.3 mg/0.3 mL SOAJ Inject 0.3 mLs (0.3 mg total) into the muscle once. (Patient not taking: Reported on 11/05/2015) 1 Device 1   No current facility-administered medications for this visit.    Objective: BP 130/70 mmHg  Pulse 94  Temp(Src) 99.1 F (37.3 C)  Wt 236 lb (107.049 kg)  LMP 10/02/2015 (Approximate) Gen: NAD, resting comfortably CV: RRR no murmurs rubs or gallops Lungs: CTAB no crackles, wheeze, rhonchi Abdomen: soft/nontender/nondistended/normal bowel sounds. Ext: 1+ edema nonpitting. Right lower extremity with erythema, warmth, tenderness throughout most of medial side of lower leg. Left leg with smaller area. I point to highest level of erythema and warmth in photograph below. Erythema more pronounced in person than in photograph Skin: warm, dry MSK: muuch less severe pain along metatarsal 4th on left foot.  Neuro: grossly normal, moves all extremities     Assessment/Plan:  Rash S: last Saturday noted some slight redness on her right lower leg, since that time it has expanded on the lower leg and started to go up the  medial side of that leg. Just a few days ago noted small area similar to previous on right leg. She notes warmth, tenderness and swelling in areas where she notes the redness. She continues her peritoneal dialysis. She is immunocompromised on cellcept and plaquenil for her lupus followed at [facility] Dr. Hervey Ard. Of note, her left foot pain from a week ago has improved without wearing post op shoe. She feels like the skin has some bumps under it which I have a hard time appreciating ROS-not ill appearing, no fever/chills. No new medications.  No mucus  membrane involvement.  A/P: Given warmth, erythema, tenderness and swelling suspect cellulitis. Her temperature is also up to 99.1 which concerns me for infectious origin. No renal dosing for doxycycline so we will use doxycycline 100mg  BID for 7-10 days. Gave her 14 days in case we prolong past then but she would see me if still present at 10 days or if not improving by Monday or Tuesday significantly- seek care over weekend if worsens.   Only odd thing here is the beginning of similar findings on left. We will treat as infectious. 22% of patients can get rash on cellcept but she has been on for years and done well. Did not see any particular rash pattern with plaquenil. This does not fit lupus rash I am aware of.   Return precautions advised.   Meds ordered this encounter  Medications  . doxycycline (VIBRA-TABS) 100 MG tablet    Sig: Take 1 tablet (100 mg total) by mouth 2 (two) times daily.    Dispense:  28 tablet    Refill:  0

## 2015-11-12 NOTE — Patient Instructions (Signed)
Take doxycycline for 7-10 days. 7 at minimum, 10 if not fully resolved. If still having issues at 10 days, see me back also if not improving by Monday or Tuesday lets reevaluate.   Cellulitis Cellulitis is an infection of the skin and the tissue beneath it. The infected area is usually red and tender. Cellulitis occurs most often in the arms and lower legs.  CAUSES  Cellulitis is caused by bacteria that enter the skin through cracks or cuts in the skin. The most common types of bacteria that cause cellulitis are staphylococci and streptococci. SIGNS AND SYMPTOMS   Redness and warmth.  Swelling.  Tenderness or pain.  Fever. DIAGNOSIS  Your health care provider can usually determine what is wrong based on a physical exam. Blood tests may also be done. TREATMENT  Treatment usually involves taking an antibiotic medicine. HOME CARE INSTRUCTIONS   Take your antibiotic medicine as directed by your health care provider. Finish the antibiotic even if you start to feel better.  Keep the infected arm or leg elevated to reduce swelling.  Apply a warm cloth to the affected area up to 4 times per day to relieve pain.  Take medicines only as directed by your health care provider.  Keep all follow-up visits as directed by your health care provider. SEEK MEDICAL CARE IF:   You notice red streaks coming from the infected area.  Your red area gets larger or turns dark in color.  Your bone or joint underneath the infected area becomes painful after the skin has healed.  Your infection returns in the same area or another area.  You notice a swollen bump in the infected area.  You develop new symptoms.  You have a fever. SEEK IMMEDIATE MEDICAL CARE IF:   You feel very sleepy.  You develop vomiting or diarrhea.  You have a general ill feeling (malaise) with muscle aches and pains.   This information is not intended to replace advice given to you by your health care provider. Make sure  you discuss any questions you have with your health care provider.   Document Released: 07/19/2005 Document Revised: 06/30/2015 Document Reviewed: 12/25/2011 Elsevier Interactive Patient Education [employer].

## 2015-12-01 ENCOUNTER — Telehealth: Payer: Self-pay | Admitting: Family Medicine

## 2015-12-01 NOTE — Telephone Encounter (Signed)
[facility] [place] Day - Client [place] Call Center Patient Name: [name] [name] DOB: 1981-09-12 Initial Comment caller states she has swelling, pain and redness in her legs Nurse Assessment Nurse: Martyn Ehrich, RN, Felicia Date/Time ([place]ix[place] Time): 12/01/2015 3:09:03 PM Confirm and document reason for call. If symptomatic, describe symptoms. You must click the next button to save text entered. ---PT had swelling redness in both legs a few weeks ago and MD gave her antibiotics for cellulitis and it improved some and now it has returned - on shins. No fever. Dermatologist also gave her 2nd round of antx Monday and it is not helping this time. Doxycycline (same as previous MD [Hunter] gave her.) Has the patient traveled out of the country within the last 30 days? ---No Does the patient have any new or worsening symptoms? ---Yes Will a triage be completed? ---Yes Related visit to physician within the last 2 weeks? ---No Does the PT have any chronic conditions? (i.e. diabetes, asthma, etc.) ---Yes List chronic conditions. ---chronic kidney d. and lupus -Did the patient indicate they were pregnant? ---No Is this a behavioral health or substance abuse call? ---No Guidelines Guideline Title Affirmed Question Affirmed Notes Cellulitis on Antibiotic Follow-up Call [1] Taking antibiotic > 24 hours AND [2] cellulitis symptoms are WORSE (e.g., spreading redness, pain, swelling) Final Disposition User See Physician within 24 Hours Gaddy, RN, Felicia Referrals REFERRED TO PCP OFFICE Disagree/Comply: Comply Call Id: [id number]

## 2015-12-01 NOTE — Telephone Encounter (Signed)
Pt has an appt with Dr Maudie Mercury tomorrow

## 2015-12-02 ENCOUNTER — Encounter: Payer: Self-pay | Admitting: Family Medicine

## 2015-12-02 ENCOUNTER — Ambulatory Visit (INDEPENDENT_AMBULATORY_CARE_PROVIDER_SITE_OTHER): Payer: BLUE CROSS/BLUE SHIELD | Admitting: Family Medicine

## 2015-12-02 VITALS — BP 100/78 | HR 63 | Temp 97.9°F | Ht 67.0 in | Wt 226.6 lb

## 2015-12-02 DIAGNOSIS — N186 End stage renal disease: Secondary | ICD-10-CM | POA: Diagnosis not present

## 2015-12-02 DIAGNOSIS — L03119 Cellulitis of unspecified part of limb: Secondary | ICD-10-CM

## 2015-12-02 DIAGNOSIS — I429 Cardiomyopathy, unspecified: Secondary | ICD-10-CM | POA: Diagnosis not present

## 2015-12-02 DIAGNOSIS — Z992 Dependence on renal dialysis: Secondary | ICD-10-CM | POA: Diagnosis not present

## 2015-12-02 NOTE — Progress Notes (Signed)
Pre visit review using our clinic review tool, if applicable. No additional management support is needed unless otherwise documented below in the visit note. 

## 2015-12-02 NOTE — Progress Notes (Signed)
HPI:  [name] [name] is a pleasant 35 yo with a very complicated PMH including ESRD on dialysis and cardiomyopathy with chronic le edema per her report, here for an a cute visit for:  Cellulitis: -per PCP notes and pt, developed warm, mildly tender rash on legs several weeks ago along with a low grade fevers -was dx with cellulitis and placed on doxy (2 week course) - reports symptoms resolved then recurred about 4 days ago -reports: bilat lower ext redness, warmth and pain - she has seen her dialysis clinic and her dermatologist for this and the area was marked and she was placed on doxy - now symptoms are resolving and she plans to follow up with derm next week -denies: fevers, malaise, worsening redness swelling or pain  ROS: See pertinent positives and negatives per HPI.  Past Medical History  Diagnosis Date  . Unspecified essential hypertension   . Unspecified deficiency anemia   . Systemic lupus erythematosus ([place])   . Secondary cardiomyopathy, unspecified   . ERYTHEMATOSUS, LUPUS 08/07/2006  . CHF (congestive heart failure) ([place]) 5-6 yrs ago  hosp  . Chronic kidney disease   . Intestinal infection due to Clostridium difficile     Past Surgical History  Procedure Laterality Date  . Kidney biopsies      to determine lupus nephritis    Family History  Problem Relation Age of Onset  . Lupus Mother   . Cancer Maternal Grandmother     unknown  . Prostate cancer Paternal Grandfather   . Diabetes Brother   . Diabetes      mat cousin    Social History   Social History  . Marital Status: Single    Spouse Name: N/A  . Number of Children: 0  . 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 Smoking status: Never Smoker   . Smokeless tobacco: Never Used  . Alcohol Use: 0.0 oz/week    0 Standard drinks or equivalent per week     Comment: socially 2x a month  . Drug Use: No  . Sexual Activity:  No   Other Topics Concern  . None   Social History Narrative   Lives with sister.       Works at [employer]: cooking, watch movies, time with friends     Current outpatient prescriptions:  .  allopurinol (ZYLOPRIM) 300 MG tablet, TAKE 1 TABLET BY MOUTH DAILY, Disp: 90 tablet, Rfl: 2 .  calcitRIOL (ROCALTROL) 0.25 MCG capsule, Take 0.25 mcg by mouth 3 (three) times a week., Disp: , Rfl:  .  cloNIDine (CATAPRES - DOSED IN MG/24 HR) 0.2 mg/24hr patch, Place 1 patch (0.2 mg total) onto the skin once a week. PATIENT NEEDS TO CONTACT OFFICE FOR ADDITIONAL REFILLS, Disp: 4 patch, Rfl: 0 .  desonide (DESOWEN) 0.05 % ointment, Apply 1 application topically once a week., Disp: , Rfl: 1 .  diltiazem (TIAZAC) 360 MG 24 hr capsule, Take 1 capsule by mouth daily., Disp: , Rfl:  .  doxycycline (VIBRA-TABS) 100 MG tablet, Take 1 tablet (100 mg total) by mouth 2 (two) times daily., Disp: 28 tablet, Rfl: 0 .  EPINEPHrine (EPIPEN) 0.3 mg/0.3 mL SOAJ, Inject 0.3 mLs (0.3 mg total) into the muscle once., Disp: 1 Device, Rfl: 1 .  hydrALAZINE (APRESOLINE) 50 MG tablet, Take 50 mg by  mouth 3 (three) times daily., Disp: , Rfl:  .  hydroxychloroquine (PLAQUENIL) 200 MG tablet, TAKE 1 TABLET BY MOUTH TWICE A DAY, Disp: 60 tablet, Rfl: 1 .  labetalol (NORMODYNE) 200 MG tablet, Take 1 tablet (200 mg total) by mouth 3 (three) times daily., Disp: 270 tablet, Rfl: 3 .  mycophenolate (CELLCEPT) 500 MG tablet, Take 1,000 mg by mouth., Disp: , Rfl:  .  predniSONE (DELTASONE) 10 MG tablet, Complete 15mg  daily for 2 weeks, then 10mg  daily, Disp: , Rfl:  .  valACYclovir (VALTREX) 1000 MG tablet, Take 1,000 mg by mouth daily. Reported on 11/05/2015, Disp: , Rfl:   EXAM:  Filed Vitals:   12/02/15 0928  BP: 100/78  Pulse: 63  Temp: 97.9 F (36.6 C)    Body mass index is 35.48 kg/(m^2).  GENERAL: vitals reviewed and listed above, alert, oriented, appears well hydrated and in no acute  distress  HEENT: atraumatic, conjunttiva clear, no obvious abnormalities on inspection of external nose and ears  NECK: no obvious masses on inspection  LUNGS: clear to auscultation bilaterally, no wheezes, rales or rhonchi, good air movement  CV: HRRR, tr bilater ankle edema, mildly erythematous skin with mild warmth on bilater lower legs receeding from skin markings, no open wounds, no TTP of deep veins  SKIN: tr bilater ankle edema, mildly erythematous skin with mild warmth on bilater lower legs receeding from skin markings, no open wounds, no TTP of deep veins  MS: moves all extremities without noticeable abnormality  PSYCH: pleasant and cooperative, no obvious depression or anxiety  ASSESSMENT AND PLAN:  Discussed the following assessment and plan:  Cellulitis of lower extremity, unspecified laterality  ESRD on dialysis ([place])  Secondary cardiomyopathy ([place])  -prior PCP note on this topic reviewed - agree given hx infectious etiology most likely, an likely secondary to circulatory issues -continue antibiotic -elevation, compression when tolerated and infection resolving -follow up with dermatologist as planned and here as needed -Patient advised to return or notify a doctor immediately if symptoms worsen or persist or new concerns arise.  Patient Instructions  Please continue the antibiotic per your dermatologist's recommendations  Elevate legs for 30 minutes twice daily   When infection better and able - please start using compression socks if possible during the day and remove at night  Follow up with your dermatologist as planned and as needed with your primary doctor     Lucretia Kern.

## 2015-12-02 NOTE — Patient Instructions (Signed)
Please continue the antibiotic per your dermatologist's recommendations  Elevate legs for 30 minutes twice daily   When infection better and able - please start using compression socks if possible during the day and remove at night  Follow up with your dermatologist as planned and as needed with your primary doctor

## 2016-02-06 ENCOUNTER — Other Ambulatory Visit: Payer: Self-pay | Admitting: Cardiology

## 2016-02-07 NOTE — Telephone Encounter (Signed)
Rx(s) sent to pharmacy electronically.  

## 2016-02-14 ENCOUNTER — Encounter: Payer: Self-pay | Admitting: Family Medicine

## 2016-02-14 ENCOUNTER — Ambulatory Visit (INDEPENDENT_AMBULATORY_CARE_PROVIDER_SITE_OTHER): Payer: BLUE CROSS/BLUE SHIELD | Admitting: Family Medicine

## 2016-02-14 VITALS — BP 122/80 | HR 116 | Temp 100.9°F | Wt 230.0 lb

## 2016-02-14 DIAGNOSIS — L03116 Cellulitis of left lower limb: Secondary | ICD-10-CM

## 2016-02-14 MED ORDER — CEPHALEXIN 250 MG PO CAPS
250.0000 mg | ORAL_CAPSULE | Freq: Two times a day (BID) | ORAL | Status: DC
Start: 2016-02-14 — End: 2016-02-24

## 2016-02-14 MED ORDER — DOXYCYCLINE HYCLATE 100 MG PO TABS: 100.0000 mg | ORAL_TABLET | Freq: Two times a day (BID) | ORAL | Status: DC

## 2016-02-14 NOTE — Progress Notes (Signed)
Subjective:  [name] [name] is a 35 y.o. year old very pleasant female patient who presents for/with See problem oriented charting ROS- has had fever. No chills. Minimal fatigue. No chest pain, shortness of breath. Very mild left CVA tenderness  Past Medical History-  Patient Active Problem List   Diagnosis Date Noted  . ESRD on dialysis [facility]) 08/05/2015    Priority: High  . Essential hypertension 09/22/2010    Priority: High  . Secondary cardiomyopathy ([place]) 07/14/2009    Priority: High  . LUPUS NEPHRITIS 07/14/2009    Priority: High  . ERYTHEMATOSUS, LUPUS 08/07/2006    Priority: High  . Gout 08/14/2012    Priority: Medium  . Pulmonary hypertension, secondary ([place]) 07/28/2010    Priority: Medium  . History of TIA (transient ischemic attack) 07/14/2009    Priority: Medium  . Onychomycosis 12/02/2014    Priority: Low  . Anemia 08/07/2006    Priority: Low  . Diarrhea 05/03/2015  . Enteritis due to Clostridium difficile 05/03/2015    Medications- reviewed and updated Current Outpatient Prescriptions  Medication Sig Dispense Refill  . allopurinol (ZYLOPRIM) 300 MG tablet TAKE 1 TABLET BY MOUTH DAILY 90 tablet 2  . Belimumab (BENLYSTA IV) Inject into the vein.    . calcitRIOL (ROCALTROL) 0.25 MCG capsule Take 0.25 mcg by mouth 3 (three) times a week.    . cloNIDine (CATAPRES - DOSED IN MG/24 HR) 0.2 mg/24hr patch Place 1 patch (0.2 mg total) onto the skin once a week. PATIENT NEEDS TO CONTACT OFFICE FOR ADDITIONAL REFILLS 4 patch 0  . desonide (DESOWEN) 0.05 % ointment Apply 1 application topically once a week.  1  . diltiazem (TIAZAC) 360 MG 24 hr capsule Take 1 capsule by mouth daily.    . hydrALAZINE (APRESOLINE) 50 MG tablet Take 50 mg by mouth 3 (three) times daily.    . hydroxychloroquine (PLAQUENIL) 200 MG tablet TAKE 1 TABLET BY MOUTH TWICE A DAY 60 tablet 1  . labetalol (NORMODYNE) 200 MG tablet Take 1 tablet (200 mg total) by mouth 3 (three) times daily. <PLEASE  MAKE APPOINTMENT FOR REFILLS> 90 tablet 0  . mycophenolate (CELLCEPT) 500 MG tablet Take 1,000 mg by mouth.    . predniSONE (DELTASONE) 10 MG tablet Complete 15mg  daily for 2 weeks, then 10mg  daily    . cephALEXin (KEFLEX) 250 MG capsule Take 1 capsule (250 mg total) by mouth 2 (two) times daily. 28 capsule 0  . doxycycline (VIBRA-TABS) 100 MG tablet Take 1 tablet (100 mg total) by mouth 2 (two) times daily. 28 tablet 0  . EPINEPHrine (EPIPEN) 0.3 mg/0.3 mL SOAJ Inject 0.3 mLs (0.3 mg total) into the muscle once. (Patient not taking: Reported on 02/14/2016) 1 Device 1  . valACYclovir (VALTREX) 1000 MG tablet Take 1,000 mg by mouth daily. Reported on 02/14/2016     No current facility-administered medications for this visit.    Objective: BP 122/80 mmHg  Pulse 116  Temp(Src) 100.9 F (38.3 C)  Wt 230 lb (104.327 kg) Gen: NAD, resting comfortably in chair CV: RRR no murmurs rubs or gallops Lungs: CTAB no crackles, wheeze, rhonchi Abdomen: soft/nontender/nondistended/normal bowel sounds. No rebound or guarding.  Ext: 1+ edema bilaterally under compression stoackings Left lower leg patient has erythema, warmth, and more pronounced swelling above lateral side about 8 cm up the shin. Very tender and warm to touch. Her skin has slight scaling above this area without clear skin break Skin: warm, dry, no other rash or erythema.  Neuro: grossly normal, moves all extremities  Assessment/Plan:  Cellulitis of left lower extremity S: Patient noted redness in left lower extremity yesterday, seemed to expand rapidly. She had fever last night to 100.5 but later resolved. This morning, redness, warmth and pain in left lower extremity noted and slightly worse. Has not had a fever as far as she knows until she got here. She does not feel systemically ill or fatigued thankfully.  A/P: 35 year old on peritoneal dialysis and on immunosuppressants due to lupus (also leading to kidney failure) presenting with  cellulitis of left lower extremity. She is febrile and with elevated HR as well. She fortunately outside of leg is well appearing. Has responded previously to doxycycline but had been afebrile and with less quick progression. Will use doxycyline but also keflex at peritoneal dialysis dosing. Return precautions advised. We discussed I am concerned that she may have to go into the hospital for IV antibiotics as this seems more aggressive than prior episodes. She will update me within 48 hours. She is very self aware and I trust her to make the right decision to go into hospital or not.   Meds ordered this encounter  Medications  . Belimumab (BENLYSTA IV)    Sig: Inject into the vein.  Marland Kitchen doxycycline (VIBRA-TABS) 100 MG tablet    Sig: Take 1 tablet (100 mg total) by mouth 2 (two) times daily.    Dispense:  28 tablet    Refill:  0  . cephALEXin (KEFLEX) 250 MG capsule    Sig: Take 1 capsule (250 mg total) by mouth 2 (two) times daily.    Dispense:  28 capsule    Refill:  0  Patient has had several episodes of cellulitis this year- established problem worsening.   Garret Reddish, MD

## 2016-02-14 NOTE — Patient Instructions (Signed)
Cellulitis- more severe than last 2 episodes  Start both doxycycline and keflex  If within 24 not stabilized or 48 hours still gradually worsening, need to return to care and potentially get IV antibiotics. If you have fever higher than 101 or so- also consider going in for care  Please give me an update in 2 days regardless

## 2016-02-16 ENCOUNTER — Encounter: Payer: Self-pay | Admitting: Family Medicine

## 2016-02-20 ENCOUNTER — Encounter (HOSPITAL_COMMUNITY): Payer: Self-pay | Admitting: *Deleted

## 2016-02-20 ENCOUNTER — Emergency Department (HOSPITAL_COMMUNITY)
Admission: EM | Admit: 2016-02-20 | Discharge: 2016-02-20 | Disposition: A | Discharge status: Home / Self Care | Payer: BLUE CROSS/BLUE SHIELD | Source: Home / Self Care

## 2016-02-20 ENCOUNTER — Inpatient Hospital Stay (HOSPITAL_COMMUNITY)
Admission: EM | Admit: 2016-02-20 | Discharge: 2016-02-24 | DRG: 371 | Disposition: A | Discharge status: Home / Self Care | Payer: BLUE CROSS/BLUE SHIELD | Attending: Internal Medicine | Admitting: Internal Medicine

## 2016-02-20 DIAGNOSIS — K659 Peritonitis, unspecified: Principal | ICD-10-CM | POA: Diagnosis present

## 2016-02-20 DIAGNOSIS — R1010 Upper abdominal pain, unspecified: Secondary | ICD-10-CM | POA: Insufficient documentation

## 2016-02-20 DIAGNOSIS — Z992 Dependence on renal dialysis: Secondary | ICD-10-CM

## 2016-02-20 DIAGNOSIS — Z7952 Long term (current) use of systemic steroids: Secondary | ICD-10-CM

## 2016-02-20 DIAGNOSIS — L03116 Cellulitis of left lower limb: Secondary | ICD-10-CM | POA: Diagnosis present

## 2016-02-20 DIAGNOSIS — I130 Hypertensive heart and chronic kidney disease with heart failure and stage 1 through stage 4 chronic kidney disease, or unspecified chronic kidney disease: Secondary | ICD-10-CM

## 2016-02-20 DIAGNOSIS — N189 Chronic kidney disease, unspecified: Secondary | ICD-10-CM | POA: Insufficient documentation

## 2016-02-20 DIAGNOSIS — M329 Systemic lupus erythematosus, unspecified: Secondary | ICD-10-CM | POA: Diagnosis present

## 2016-02-20 DIAGNOSIS — Z888 Allergy status to other drugs, medicaments and biological substances status: Secondary | ICD-10-CM

## 2016-02-20 DIAGNOSIS — Z79899 Other long term (current) drug therapy: Secondary | ICD-10-CM

## 2016-02-20 DIAGNOSIS — R1110 Vomiting, unspecified: Secondary | ICD-10-CM

## 2016-02-20 DIAGNOSIS — I120 Hypertensive chronic kidney disease with stage 5 chronic kidney disease or end stage renal disease: Secondary | ICD-10-CM | POA: Diagnosis present

## 2016-02-20 DIAGNOSIS — I509 Heart failure, unspecified: Secondary | ICD-10-CM | POA: Insufficient documentation

## 2016-02-20 DIAGNOSIS — N186 End stage renal disease: Secondary | ICD-10-CM

## 2016-02-20 DIAGNOSIS — Z5321 Procedure and treatment not carried out due to patient leaving prior to being seen by health care provider: Secondary | ICD-10-CM

## 2016-02-20 DIAGNOSIS — I10 Essential (primary) hypertension: Secondary | ICD-10-CM | POA: Diagnosis present

## 2016-02-20 DIAGNOSIS — R112 Nausea with vomiting, unspecified: Secondary | ICD-10-CM

## 2016-02-20 DIAGNOSIS — N390 Urinary tract infection, site not specified: Secondary | ICD-10-CM | POA: Diagnosis present

## 2016-02-20 DIAGNOSIS — L930 Discoid lupus erythematosus: Secondary | ICD-10-CM | POA: Diagnosis present

## 2016-02-20 DIAGNOSIS — N179 Acute kidney failure, unspecified: Secondary | ICD-10-CM | POA: Diagnosis present

## 2016-02-20 LAB — URINALYSIS, ROUTINE W REFLEX MICROSCOPIC
Bilirubin Urine: NEGATIVE
Glucose, UA: NEGATIVE mg/dL
Ketones, ur: NEGATIVE mg/dL
NITRITE: NEGATIVE
PH: 5.5 (ref 5.0–8.0)
Protein, ur: 100 mg/dL — AB
SPECIFIC GRAVITY, URINE: 1.017 (ref 1.005–1.030)

## 2016-02-20 LAB — COMPREHENSIVE METABOLIC PANEL
ALK PHOS: 68 U/L (ref 38–126)
ALT: 17 U/L (ref 14–54)
AST: 19 U/L (ref 15–41)
Albumin: 3.6 g/dL (ref 3.5–5.0)
Anion gap: 20 — ABNORMAL HIGH (ref 5–15)
BUN: 63 mg/dL — ABNORMAL HIGH (ref 6–20)
CO2: 24 mmol/L (ref 22–32)
Calcium: 9.5 mg/dL (ref 8.9–10.3)
Chloride: 95 mmol/L — ABNORMAL LOW (ref 101–111)
Creatinine, Ser: 19.31 mg/dL — ABNORMAL HIGH (ref 0.44–1.00)
GFR, EST AFRICAN AMERICAN: 2 mL/min — AB (ref >60)
GFR, EST NON AFRICAN AMERICAN: 2 mL/min — AB (ref >60)
GLUCOSE: 93 mg/dL (ref 65–99)
Potassium: 3.7 mmol/L (ref 3.5–5.1)
SODIUM: 139 mmol/L (ref 135–145)
TOTAL PROTEIN: 7.2 g/dL (ref 6.5–8.1)
Total Bilirubin: 0.5 mg/dL (ref 0.3–1.2)

## 2016-02-20 LAB — CBC
HEMATOCRIT: 35 % — AB (ref 36.0–46.0)
HEMOGLOBIN: 11.3 g/dL — AB (ref 12.0–15.0)
MCH: 27.2 pg (ref 26.0–34.0)
MCHC: 32.3 g/dL (ref 30.0–36.0)
MCV: 84.1 fL (ref 78.0–100.0)
Platelets: 330 10*3/uL (ref 150–400)
RBC: 4.16 MIL/uL (ref 3.87–5.11)
RDW: 15.6 % — ABNORMAL HIGH (ref 11.5–15.5)
WBC: 6 10*3/uL (ref 4.0–10.5)

## 2016-02-20 LAB — URINE MICROSCOPIC-ADD ON

## 2016-02-20 MED ORDER — SEVELAMER CARBONATE 800 MG PO TABS
3200.0000 mg | ORAL_TABLET | Freq: Three times a day (TID) | ORAL | Status: DC
  Administered 2016-02-21 – 2016-02-24 (×7): 3200 mg via ORAL
  Filled 2016-02-20 (×13): qty 4

## 2016-02-20 MED ORDER — ONDANSETRON HCL 4 MG/2ML IJ SOLN
4.0000 mg | Freq: Once | INTRAMUSCULAR | Status: AC
  Administered 2016-02-20: 4 mg via INTRAVENOUS
  Filled 2016-02-20: qty 2

## 2016-02-20 MED ORDER — ONDANSETRON HCL 4 MG/2ML IJ SOLN
4.0000 mg | Freq: Four times a day (QID) | INTRAMUSCULAR | Status: DC | PRN
  Administered 2016-02-22 (×2): 4 mg via INTRAVENOUS
  Filled 2016-02-20 (×2): qty 2

## 2016-02-20 MED ORDER — CLONIDINE HCL 0.2 MG/24HR TD PTWK: 0.2000 mg | MEDICATED_PATCH | TRANSDERMAL | Status: DC

## 2016-02-20 MED ORDER — ONDANSETRON HCL 4 MG PO TABS: 4.0000 mg | ORAL_TABLET | Freq: Four times a day (QID) | ORAL | Status: DC | PRN

## 2016-02-20 MED ORDER — VANCOMYCIN HCL IN DEXTROSE 1-5 GM/200ML-% IV SOLN
1000.0000 mg | Freq: Once | INTRAVENOUS | Status: AC
  Administered 2016-02-21: 1000 mg via INTRAVENOUS
  Filled 2016-02-20: qty 200

## 2016-02-20 MED ORDER — DEXTROSE 5 % IV SOLN: 1.0000 g | INTRAVENOUS | Status: DC

## 2016-02-20 MED ORDER — DOXYCYCLINE HYCLATE 100 MG PO TABS: 100.0000 mg | ORAL_TABLET | Freq: Two times a day (BID) | ORAL | Status: DC

## 2016-02-20 MED ORDER — CALCITRIOL 0.25 MCG PO CAPS
0.2500 ug | ORAL_CAPSULE | Freq: Every day | ORAL | Status: DC
  Administered 2016-02-21 – 2016-02-24 (×4): 0.25 ug via ORAL
  Filled 2016-02-20 (×5): qty 1

## 2016-02-20 MED ORDER — HYDROXYCHLOROQUINE SULFATE 200 MG PO TABS
200.0000 mg | ORAL_TABLET | Freq: Two times a day (BID) | ORAL | Status: DC
  Administered 2016-02-21 – 2016-02-24 (×7): 200 mg via ORAL
  Filled 2016-02-20 (×9): qty 1

## 2016-02-20 MED ORDER — PANTOPRAZOLE SODIUM 40 MG IV SOLR
40.0000 mg | INTRAVENOUS | Status: DC
  Administered 2016-02-21 (×2): 40 mg via INTRAVENOUS
  Filled 2016-02-20 (×2): qty 40

## 2016-02-20 MED ORDER — ALLOPURINOL 100 MG PO TABS
100.0000 mg | ORAL_TABLET | Freq: Every day | ORAL | Status: DC
  Filled 2016-02-20: qty 1

## 2016-02-20 MED ORDER — HYDRALAZINE HCL 50 MG PO TABS
50.0000 mg | ORAL_TABLET | Freq: Three times a day (TID) | ORAL | Status: DC
Start: 2016-02-20 — End: 2016-02-20

## 2016-02-20 MED ORDER — DILTIAZEM HCL ER BEADS 240 MG PO CP24: 360.0000 mg | ORAL_CAPSULE | Freq: Every day | ORAL | Status: DC

## 2016-02-20 MED ORDER — ENOXAPARIN SODIUM 30 MG/0.3ML ~~LOC~~ SOLN
30.0000 mg | SUBCUTANEOUS | Status: DC
  Administered 2016-02-21 – 2016-02-24 (×5): 30 mg via SUBCUTANEOUS
  Filled 2016-02-20 (×6): qty 0.3

## 2016-02-20 MED ORDER — VALACYCLOVIR HCL 500 MG PO TABS
1000.0000 mg | ORAL_TABLET | Freq: Every day | ORAL | Status: DC | PRN
  Filled 2016-02-20: qty 2

## 2016-02-20 MED ORDER — LABETALOL HCL 200 MG PO TABS: 200.0000 mg | ORAL_TABLET | Freq: Three times a day (TID) | ORAL | Status: DC

## 2016-02-20 MED ORDER — PIPERACILLIN-TAZOBACTAM 3.375 G IVPB 30 MIN
3.3750 g | Freq: Once | INTRAVENOUS | Status: AC
  Administered 2016-02-21: 3.375 g via INTRAVENOUS
  Filled 2016-02-20: qty 50

## 2016-02-20 MED ORDER — SODIUM CHLORIDE 0.9 % IV BOLUS (SEPSIS)
500.0000 mL | Freq: Once | INTRAVENOUS | Status: AC
  Administered 2016-02-20: 500 mL via INTRAVENOUS

## 2016-02-20 MED ORDER — SODIUM CHLORIDE 0.9 % IV BOLUS (SEPSIS): 1000.0000 mL | Freq: Once | INTRAVENOUS | Status: DC

## 2016-02-20 MED ORDER — HYDROCORTISONE 1 % EX OINT
TOPICAL_OINTMENT | Freq: Two times a day (BID) | CUTANEOUS | Status: DC
  Administered 2016-02-21: 21:00:00 via TOPICAL
  Filled 2016-02-20: qty 28.35

## 2016-02-20 MED ORDER — MYCOPHENOLATE MOFETIL 250 MG PO CAPS
1000.0000 mg | ORAL_CAPSULE | Freq: Two times a day (BID) | ORAL | Status: DC
  Administered 2016-02-21 – 2016-02-24 (×7): 1000 mg via ORAL
  Filled 2016-02-20 (×9): qty 4

## 2016-02-20 MED ORDER — PREDNISONE 5 MG PO TABS
5.0000 mg | ORAL_TABLET | Freq: Every day | ORAL | Status: DC
  Administered 2016-02-21 – 2016-02-24 (×4): 5 mg via ORAL
  Filled 2016-02-20 (×6): qty 1

## 2016-02-20 NOTE — ED Notes (Signed)
Pt complains of upper abdominal pain, nausea vomiting for the past 5 days. Pt denies diarrhea. Pt is on dialysis, states the vomiting started during dialysis   Pt checked in earlier today, states she thought she felt better due to taking nausea medication earlier today but states her symptoms returned.

## 2016-02-20 NOTE — ED Notes (Addendum)
Pt complains of upper abdominal pain, nausea vomiting for the past 5 days. Pt denies diarrhea. Pt is on dialysis, states the vomiting started during dialysis.

## 2016-02-20 NOTE — H&P (Signed)
History and Physical  [name] [name] [id number] DOB: Jul 16, 1981 DOA: 02/20/2016  PCP:  Garret Reddish, MD   Chief Complaint:  Nausea and vomiting.   History of Present Illness:  Patient is a 35 yo female with history of HTN, ESRD on PD, lupus who came with cc of nausea and vomiting for the past 4 days. Vomitus is non bloody. Both were associated mainly with PD and after finishing PD every night associated with mild diffuse abdominal pain w/o dysuria or diarrhea. She has had cellulitis in left lower extremity diagnosed 5 days ago and was prescribed doxy/keflex which likely did not help as she was vomiting. She had mild fever up to 100.9 with occasional chills. Otherwise no chest pain or dyspnea.   Review of Systems:  CONSTITUTIONAL:     No night sweats.  No fatigue.  +fever. +chills. Eyes:                            No visual changes.  No eye pain.  No eye discharge.   ENT:                              No epistaxis.  No sinus pain.  No sore throat.   No congestion. RESPIRATORY:           No cough.  No wheeze.  No hemoptysis.  No dyspnea CARDIOVASCULAR   :  No chest pains.  No palpitations. GASTROINTESTINAL:  +abdominal pain.  +nausea. +vomiting.  No diarrhea. No constipation.  No hematemesis.  No hematochezia.  No melena. GENITOURINARY:      No urgency.  No frequency.  No dysuria.  No hematuria.  No obstructive symptoms.  No discharge.  No pain.  MUSCULOSKELETAL:  No musculoskeletal pain.  No joint swelling.  No arthritis. NEUROLOGICAL:        No confusion.  No weakness. No headache. No seizure. PSYCHIATRIC:             No depression. No anxiety. No suicidal ideation. SKIN:                             No rashes.  No lesions.  No wounds. ENDOCRINE:                No weight loss.  No polydipsia.  No polyuria.  No polyphagia. HEMATOLOGIC:           No purpura.  No petechiae.  No bleeding.  ALLERGIC                 : No pruritus.  No angioedema Other:  Past Medical and  Surgical History:   Past Medical History  Diagnosis Date  . Unspecified essential hypertension   . Unspecified deficiency anemia   . Systemic lupus erythematosus ([place])   . Secondary cardiomyopathy, unspecified   . ERYTHEMATOSUS, LUPUS 08/07/2006  . CHF (congestive heart failure) ([place]) 5-6 yrs ago  hosp  . Chronic kidney disease   . Intestinal infection due to Clostridium difficile    Past Surgical History  Procedure Laterality Date  . Kidney biopsies      to determine lupus nephritis    Social History:   reports that she has never smoked. She has never used smokeless tobacco. She reports that she drinks alcohol. She reports that she does not use illicit drugs.  Allergies  Allergen Reactions  . Lisinopril Swelling  . Nsaids     She should avoid all due to renal insufficiency    Family History  Problem Relation Age of Onset  . Lupus Mother   . Cancer Maternal Grandmother     unknown  . Prostate cancer Paternal Grandfather   . Diabetes Brother   . Diabetes      mat cousin      Prior to Admission medications   Medication Sig Start Date End Date Taking? Authorizing Provider  allopurinol (ZYLOPRIM) 300 MG tablet TAKE 1 TABLET BY MOUTH DAILY 06/07/15  Yes Marin Olp, MD  Belimumab (BENLYSTA IV) Inject into the vein every 14 (fourteen) days.    Yes Historical Provider, MD  calcitRIOL (ROCALTROL) 0.25 MCG capsule Take 0.25 mcg by mouth daily.    Yes Historical Provider, MD  cephALEXin (KEFLEX) 250 MG capsule Take 1 capsule (250 mg total) by mouth 2 (two) times daily. 02/14/16  Yes Marin Olp, MD  cloNIDine (CATAPRES - DOSED IN MG/24 HR) 0.2 mg/24hr patch Place 1 patch (0.2 mg total) onto the skin once a week. PATIENT NEEDS TO CONTACT OFFICE FOR ADDITIONAL REFILLS 10/08/15  Yes Minus Breeding, MD  desonide (DESOWEN) 0.05 % ointment Apply 1 application topically daily.  09/15/14  Yes Historical Provider, MD  diltiazem (TIAZAC) 360 MG 24 hr capsule Take 360 mg by  mouth daily.  09/28/14  Yes Historical Provider, MD  doxycycline (VIBRA-TABS) 100 MG tablet Take 1 tablet (100 mg total) by mouth 2 (two) times daily. 02/14/16  Yes Marin Olp, MD  gentamicin cream (GARAMYCIN) 0.1 % Apply 1 application topically 3 (three) times daily as needed. For cleaning exit site. 12/28/15  Yes Historical Provider, MD  hydrALAZINE (APRESOLINE) 50 MG tablet Take 50 mg by mouth 3 (three) times daily.   Yes Historical Provider, MD  hydroxychloroquine (PLAQUENIL) 200 MG tablet TAKE 1 TABLET BY MOUTH TWICE A DAY 10/26/14  Yes Bruce Kendall Flack, MD  labetalol (NORMODYNE) 200 MG tablet Take 1 tablet (200 mg total) by mouth 3 (three) times daily. <PLEASE MAKE APPOINTMENT FOR REFILLS> 02/07/16  Yes Minus Breeding, MD  mycophenolate (CELLCEPT) 500 MG tablet Take 1,000 mg by mouth 2 (two) times daily.    Yes Historical Provider, MD  predniSONE (DELTASONE) 10 MG tablet Take 5 mg by mouth daily.  01/26/16  Yes Historical Provider, MD  RENVELA 800 MG tablet Take 1,600-3,200 mg by mouth as directed. 4 tablet by mouth three times daily with meals and 1-2 tablets with snacks. 01/26/16  Yes Historical Provider, MD  torsemide (DEMADEX) 100 MG tablet Take 100 mg by mouth every morning. 11/16/15  Yes Historical Provider, MD  valACYclovir (VALTREX) 1000 MG tablet Take 1,000 mg by mouth daily as needed (for flares.). Reported on 02/14/2016 10/29/13  Yes Historical Provider, MD    Physical Exam: BP 121/83 mmHg  Pulse 114  Temp(Src) 98.5 F (36.9 C) (Oral)  Resp 18  SpO2 99%  LMP 01/30/2016  GENERAL :   Alert and cooperative, and appears to be in no acute distress. HEAD:           normocephalic. EYES:            PERRL, EOMI.  vision is grossly intact. EARS:           hearing grossly intact. NOSE:           No nasal discharge. THROAT:     Oral cavity and pharynx  normal.   NECK:          supple, non-tender.  CARDIAC:    Normal S1 and S2. No gallop. No murmurs.  Vascular:     no peripheral edema.  Marland Kitchen LUNGS:       Clear to auscultation  ABDOMEN: Positive bowel sounds. Soft, nondistended, mildly tender to palpation. Site of PD looks clean. No guarding or rebound.      MSK:           No joint erythema or tenderness. Normal muscular development. EXT           : No significant deformity or joint abnormality. Neuro        : Alert, oriented to person, place, and time.                      CN II-XII intact.                   SKIN:            No rash. No lesions. PSYCH:       No hallucination. Patient is not suicidal.          Labs on Admission:  Reviewed.   Radiological Exams on Admission: No results found.    Assessment/Plan  Nausea/vomiting: Cellulitis vs. Gastritis vs. UTI vs. Peritonitis Failed outpt management of cellulitis ; will switch to vanc/zosyn pharmacy dosing zofran prn  PPI IV Ucx pending  Will check PD fluid cell count/culture  ESRD on HD:  Nephrology consult Transfer to [facility] May need HD transiently   HTN: not taking BP meds  SLE: continue Plaquenil      Input & Output:  Ordered  Lines & Tubes: PD, PIV DVT prophylaxis: [place] enoxaparin  GI prophylaxis: PPI Consultants: Nephrology Code Status: Full  Family Communication: at bedside   Disposition Plan: transfer to [facility].     Gennaro Africa M.D [facility]

## 2016-02-20 NOTE — ED Provider Notes (Signed)
CSN: [id number]     Arrival date & time 02/20/16  1947 History   First MD Initiated Contact with Patient 02/20/16 2142     Chief Complaint  Patient presents with  . Abdominal Pain  . Nausea     (Consider location/radiation/quality/duration/timing/severity/associated sxs/prior Treatment) HPI Comments: Patient with past medical history of lupus, chronic kidney disease, and CHF presents to the emergency department with chief complaint of nausea and vomiting. She states that she began vomiting today after completing peritoneal dialysis. She states that she has had a fever to 100. She also reports having some left flank pain x 5 days. She denies any abdominal pain, chest pain or shortness of breath.  She denies any dysuria.  The history is provided by the patient. No language interpreter was used.    Past Medical History  Diagnosis Date  . Unspecified essential hypertension   . Unspecified deficiency anemia   . Systemic lupus erythematosus ([place])   . Secondary cardiomyopathy, unspecified   . ERYTHEMATOSUS, LUPUS 08/07/2006  . CHF (congestive heart failure) ([place]) 5-6 yrs ago  hosp  . Chronic kidney disease   . Intestinal infection due to Clostridium difficile    Past Surgical History  Procedure Laterality Date  . Kidney biopsies      to determine lupus nephritis   Family History  Problem Relation Age of Onset  . Lupus Mother   . Cancer Maternal Grandmother     unknown  . Prostate cancer Paternal Grandfather   . Diabetes Brother   . Diabetes      mat cousin   Social History  Substance Use Topics  . Smoking status: Never Smoker   . Smokeless tobacco: Never Used  . Alcohol Use: 0.0 oz/week    0 Standard drinks or equivalent per week     Comment: socially 2x a month   OB History    No data available     Review of Systems  Constitutional: Negative for fever and chills.  Respiratory: Negative for shortness of breath.   Cardiovascular: Negative for chest pain.   Gastrointestinal: Positive for nausea and vomiting. Negative for diarrhea and constipation.  Genitourinary: Positive for flank pain. Negative for dysuria.  All other systems reviewed and are negative.     Allergies  Lisinopril and Nsaids  Home Medications   Prior to Admission medications   Medication Sig Start Date End Date Taking? Authorizing Provider  allopurinol (ZYLOPRIM) 300 MG tablet TAKE 1 TABLET BY MOUTH DAILY 06/07/15  Yes Marin Olp, MD  Belimumab (BENLYSTA IV) Inject into the vein every 14 (fourteen) days.    Yes Historical Provider, MD  calcitRIOL (ROCALTROL) 0.25 MCG capsule Take 0.25 mcg by mouth daily.    Yes Historical Provider, MD  cephALEXin (KEFLEX) 250 MG capsule Take 1 capsule (250 mg total) by mouth 2 (two) times daily. 02/14/16  Yes Marin Olp, MD  cloNIDine (CATAPRES - DOSED IN MG/24 HR) 0.2 mg/24hr patch Place 1 patch (0.2 mg total) onto the skin once a week. PATIENT NEEDS TO CONTACT OFFICE FOR ADDITIONAL REFILLS 10/08/15  Yes Minus Breeding, MD  desonide (DESOWEN) 0.05 % ointment Apply 1 application topically daily.  09/15/14  Yes Historical Provider, MD  diltiazem (TIAZAC) 360 MG 24 hr capsule Take 360 mg by mouth daily.  09/28/14  Yes Historical Provider, MD  doxycycline (VIBRA-TABS) 100 MG tablet Take 1 tablet (100 mg total) by mouth 2 (two) times daily. 02/14/16  Yes Marin Olp, MD  gentamicin cream (  GARAMYCIN) 0.1 % Apply 1 application topically 3 (three) times daily as needed. For cleaning exit site. 12/28/15  Yes Historical Provider, MD  hydrALAZINE (APRESOLINE) 50 MG tablet Take 50 mg by mouth 3 (three) times daily.   Yes Historical Provider, MD  hydroxychloroquine (PLAQUENIL) 200 MG tablet TAKE 1 TABLET BY MOUTH TWICE A DAY 10/26/14  Yes Bruce Kendall Flack, MD  labetalol (NORMODYNE) 200 MG tablet Take 1 tablet (200 mg total) by mouth 3 (three) times daily. <PLEASE MAKE APPOINTMENT FOR REFILLS> 02/07/16  Yes Minus Breeding, MD  mycophenolate  (CELLCEPT) 500 MG tablet Take 1,000 mg by mouth 2 (two) times daily.    Yes Historical Provider, MD  predniSONE (DELTASONE) 10 MG tablet Take 5 mg by mouth daily.  01/26/16  Yes Historical Provider, MD  RENVELA 800 MG tablet Take 1,600-3,200 mg by mouth as directed. 4 tablet by mouth three times daily with meals and 1-2 tablets with snacks. 01/26/16  Yes Historical Provider, MD  torsemide (DEMADEX) 100 MG tablet Take 100 mg by mouth every morning. 11/16/15  Yes Historical Provider, MD  valACYclovir (VALTREX) 1000 MG tablet Take 1,000 mg by mouth daily as needed (for flares.). Reported on 02/14/2016 10/29/13  Yes Historical Provider, MD   BP 121/83 mmHg  Pulse 114  Temp(Src) 98.5 F (36.9 C) (Oral)  Resp 18  SpO2 99%  LMP 01/30/2016 Physical Exam  Constitutional: She is oriented to person, place, and time. She appears well-developed and well-nourished.  HENT:  Head: Normocephalic and atraumatic.  Eyes: Conjunctivae and EOM are normal. Pupils are equal, round, and reactive to light.  Neck: Normal range of motion. Neck supple.  Cardiovascular: Normal rate and regular rhythm.  Exam reveals no gallop and no friction rub.   No murmur heard. Pulmonary/Chest: Effort normal and breath sounds normal. No respiratory distress. She has no wheezes. She has no rales. She exhibits no tenderness.  Abdominal: Soft. Bowel sounds are normal. She exhibits no distension and no mass. There is no tenderness. There is no rebound and no guarding.  No focal abdominal tenderness, no RLQ tenderness or pain at McBurney's point, no RUQ tenderness or Murphy's sign, no left-sided abdominal tenderness, no fluid wave, or signs of peritonitis   Musculoskeletal: Normal range of motion. She exhibits no edema or tenderness.  Neurological: She is alert and oriented to person, place, and time.  Skin: Skin is warm and dry.  Psychiatric: She has a normal mood and affect. Her behavior is normal. Judgment and thought content normal.   Nursing note and vitals reviewed.   ED Course  Procedures (including critical care time) Results for orders placed or performed during the hospital encounter of 02/20/16  Urinalysis, Routine w reflex microscopic  Result Value Ref Range   Color, Urine YELLOW YELLOW   APPearance CLOUDY (A) CLEAR   Specific Gravity, Urine 1.017 1.005 - 1.030   pH 5.5 5.0 - 8.0   Glucose, UA NEGATIVE NEGATIVE mg/dL   Hgb urine dipstick MODERATE (A) NEGATIVE   Bilirubin Urine NEGATIVE NEGATIVE   Ketones, ur NEGATIVE NEGATIVE mg/dL   Protein, ur 100 (A) NEGATIVE mg/dL   Nitrite NEGATIVE NEGATIVE   Leukocytes, UA SMALL (A) NEGATIVE  Urine microscopic-add on  Result Value Ref Range   Squamous Epithelial / LPF 0-5 (A) NONE SEEN   WBC, UA 6-30 0 - 5 WBC/hpf   RBC / HPF 0-5 0 - 5 RBC/hpf   Bacteria, UA FEW (A) NONE SEEN   No results found.  I  have personally reviewed and evaluated these images and lab results as part of my medical decision-making.   EKG Interpretation None      MDM   Final diagnoses:  Non-intractable vomiting with nausea, vomiting of unspecified type  AKI (acute kidney injury) ([place])    Patient with nausea, vomiting, and flank pain. Started vomiting today after peritoneal dialysis.  She has CKD 2/2 lupus.  Cr is normally 5-6 at baseline.  Today it is 19.  Possible UTI, she does make a small amount of urine, but denies any dysuria.  She does not have any abdominal pain on my exam, but did have some earlier.  She is noted to be tachycardic, will give 537ml fluid bolus.  Afebrile.  Patient is non-toxic appearing.  Discussed patient with Dr. Laneta Simmers, who recommends admission.  11:16 PM Appreciate Dr. Dreama Saa, who will see the patient.  Plan for admission at [facility].  Montine Circle, PA-C 02/20/16 [place], PA-C 02/20/16 2317  Leo Grosser, MD 02/21/16 [phone]

## 2016-02-21 DIAGNOSIS — L930 Discoid lupus erythematosus: Secondary | ICD-10-CM | POA: Diagnosis not present

## 2016-02-21 DIAGNOSIS — L03116 Cellulitis of left lower limb: Secondary | ICD-10-CM | POA: Diagnosis present

## 2016-02-21 DIAGNOSIS — Z7952 Long term (current) use of systemic steroids: Secondary | ICD-10-CM | POA: Diagnosis not present

## 2016-02-21 DIAGNOSIS — Z888 Allergy status to other drugs, medicaments and biological substances status: Secondary | ICD-10-CM | POA: Diagnosis not present

## 2016-02-21 DIAGNOSIS — K659 Peritonitis, unspecified: Secondary | ICD-10-CM | POA: Diagnosis present

## 2016-02-21 DIAGNOSIS — Z992 Dependence on renal dialysis: Secondary | ICD-10-CM | POA: Diagnosis not present

## 2016-02-21 DIAGNOSIS — I120 Hypertensive chronic kidney disease with stage 5 chronic kidney disease or end stage renal disease: Secondary | ICD-10-CM | POA: Diagnosis present

## 2016-02-21 DIAGNOSIS — N390 Urinary tract infection, site not specified: Secondary | ICD-10-CM | POA: Diagnosis present

## 2016-02-21 DIAGNOSIS — M329 Systemic lupus erythematosus, unspecified: Secondary | ICD-10-CM | POA: Diagnosis present

## 2016-02-21 DIAGNOSIS — N179 Acute kidney failure, unspecified: Secondary | ICD-10-CM | POA: Diagnosis present

## 2016-02-21 DIAGNOSIS — R112 Nausea with vomiting, unspecified: Secondary | ICD-10-CM | POA: Diagnosis not present

## 2016-02-21 DIAGNOSIS — N186 End stage renal disease: Secondary | ICD-10-CM | POA: Diagnosis present

## 2016-02-21 DIAGNOSIS — Z79899 Other long term (current) drug therapy: Secondary | ICD-10-CM | POA: Diagnosis not present

## 2016-02-21 DIAGNOSIS — I10 Essential (primary) hypertension: Secondary | ICD-10-CM | POA: Diagnosis not present

## 2016-02-21 LAB — BODY FLUID CELL COUNT WITH DIFFERENTIAL
Eos, Fluid: 7 %
LYMPHS FL: 18 %
Monocyte-Macrophage-Serous Fluid: 38 % — ABNORMAL LOW (ref 50–90)
Neutrophil Count, Fluid: 37 % — ABNORMAL HIGH (ref 0–25)
Total Nucleated Cell Count, Fluid: 73 cu mm (ref 0–1000)

## 2016-02-21 MED ORDER — DELFLEX-LC/1.5% DEXTROSE 346 MOSM/L IP SOLN
INTRAPERITONEAL | Status: DC
  Administered 2016-02-23 (×2): 5000 mL via INTRAPERITONEAL

## 2016-02-21 MED ORDER — HYDROCORTISONE NA SUCCINATE PF 100 MG IJ SOLR
50.0000 mg | Freq: Two times a day (BID) | INTRAMUSCULAR | Status: AC
  Administered 2016-02-21 – 2016-02-22 (×2): 50 mg via INTRAVENOUS
  Filled 2016-02-21 (×2): qty 2

## 2016-02-21 MED ORDER — HEPARIN 1000 UNIT/ML FOR PERITONEAL DIALYSIS
2500.0000 [IU] | INTRAMUSCULAR | Status: DC | PRN
  Filled 2016-02-21: qty 2.5

## 2016-02-21 MED ORDER — GENTAMICIN SULFATE 0.1 % EX CREA
1.0000 "application " | TOPICAL_CREAM | Freq: Every day | CUTANEOUS | Status: DC
  Filled 2016-02-21: qty 15

## 2016-02-21 MED ORDER — ALLOPURINOL 300 MG PO TABS
300.0000 mg | ORAL_TABLET | Freq: Every day | ORAL | Status: DC
Start: 2016-02-22 — End: 2016-02-24
  Administered 2016-02-22 – 2016-02-24 (×3): 300 mg via ORAL
  Filled 2016-02-21 (×3): qty 1

## 2016-02-21 MED ORDER — VANCOMYCIN HCL IN DEXTROSE 1-5 GM/200ML-% IV SOLN
1000.0000 mg | Freq: Once | INTRAVENOUS | Status: AC
Start: 2016-02-21 — End: 2016-02-21
  Administered 2016-02-21: 1000 mg via INTRAVENOUS
  Filled 2016-02-21: qty 200

## 2016-02-21 MED ORDER — PIPERACILLIN-TAZOBACTAM IN DEX 2-0.25 GM/50ML IV SOLN
2.2500 g | Freq: Three times a day (TID) | INTRAVENOUS | Status: DC
  Administered 2016-02-21 (×2): 2.25 g via INTRAVENOUS
  Filled 2016-02-21 (×7): qty 50

## 2016-02-21 NOTE — Progress Notes (Signed)
[facility]                                                                              Patient Demographics  [name] [name], is a 35 y.o. female, DOB - 09/24/1981, [id number]  Admit date - 02/20/2016   Admitting Physician Gennaro Africa, MD  Outpatient Primary MD for the patient is Garret Reddish, MD  Outpatient specialists:   LOS -   days    Chief Complaint  Patient presents with  . Abdominal Pain  . Nausea       Brief summary   Patient is a 34 yo female with history of HTN, ESRD on PD, lupus who came with cc of nausea and vomiting for the past 4 days. Vomitus is non bloody. Both were associated mainly with PD and after finishing PD every night associated with mild diffuse abdominal pain w/o dysuria or diarrhea. She has had cellulitis in left lower extremity diagnosed 5 days ago and was prescribed doxy/keflex which likely did not help as she was vomiting. She had mild fever up to 100.9 with occasional chills.    Assessment & Plan   Abdominal pain with Nausea, vomiting: Possibly due to doxycycline or gastritis or viral GE - Currently improving, tolerating solid diet without any difficulty - Outpatient PD fluid from 4/28 not consistent with peritonitis however will repeat cell count and culture today  Cellulitis left lower extremities - Improving - Continue IV vancomycin and Zosyn  ESRD on peritoneal dialysis - Nephrology consulted, appreciate recommendations  SLE - Continue CellCept, prednisone, Plaquenil  Hypertension - BP soft, currently borderline, hold antihypertensives, will give 2 doses of stress dose steroids  Code Status: FC  Family Communication: Discussed in detail with the patient, all imaging results, lab results explained to the patient   Disposition Plan:   Time Spent in minutes  25 minutes  Procedures  PD  Consults   Renal   DVT Prophylaxis  Lovenox *  Medications  Scheduled Meds: . [START ON 02/22/2016]  allopurinol  300 mg Oral Daily  . calcitRIOL  0.25 mcg Oral Daily  . dialysis solution 1.5% low-MG/low-CA   Intraperitoneal Q24H  . enoxaparin (LOVENOX) injection  30 mg Subcutaneous Q24H  . gentamicin cream  1 application Topical Daily  . hydrocortisone   Topical BID  . hydroxychloroquine  200 mg Oral BID  . mycophenolate  1,000 mg Oral BID  . pantoprazole (PROTONIX) IV  40 mg Intravenous Q24H  . piperacillin-tazobactam (ZOSYN)  IV  2.25 g Intravenous Q8H  . predniSONE  5 mg Oral Q breakfast  . sevelamer carbonate  3,200 mg Oral TID WC   Continuous Infusions:  PRN Meds:.heparin, ondansetron **OR** ondansetron (ZOFRAN) IV, valACYclovir   Antibiotics   Anti-infectives    Start     Dose/Rate Route Frequency Ordered Stop   02/21/16 1000  hydroxychloroquine (PLAQUENIL) tablet 200 mg     200 mg Oral 2 times daily 02/20/16 2308     02/21/16 0800  piperacillin-tazobactam (ZOSYN) IVPB 2.25 g     2.25 g 100 mL/hr over 30 Minutes Intravenous Every 8 hours 02/21/16 0124     02/21/16  0400  vancomycin (VANCOCIN) IVPB 1000 mg/200 mL premix     1,000 mg 200 mL/hr over 60 Minutes Intravenous  Once 02/21/16 0327 02/21/16 0504   02/21/16 0000  piperacillin-tazobactam (ZOSYN) IVPB 3.375 g     3.375 g 100 mL/hr over 30 Minutes Intravenous  Once 02/20/16 2355 02/21/16 0052   02/21/16 0000  vancomycin (VANCOCIN) IVPB 1000 mg/200 mL premix     1,000 mg 200 mL/hr over 60 Minutes Intravenous  Once 02/20/16 2355 02/21/16 0202   02/20/16 2330  cefTRIAXone (ROCEPHIN) 1 g in dextrose 5 % 50 mL IVPB  Status:  Discontinued     1 g 100 mL/hr over 30 Minutes Intravenous Every 24 hours 02/20/16 2325 02/20/16 2351   02/20/16 2315  doxycycline (VIBRA-TABS) tablet 100 mg  Status:  Discontinued     100 mg Oral 2 times daily 02/20/16 2308 02/20/16 2316   02/20/16 2308  valACYclovir (VALTREX) tablet 1,000 mg     1,000 mg Oral Daily PRN 02/20/16 2308          Subjective:   [name] [name] was seen and  examined today. Feeling a lot better today, no nausea, vomiting abdominal pain is improving. No fevers or chills. Left lower activity cellulitis significant improved today. Patient denies dizziness, chest pain, shortness of breath, new weakness, numbess, tingling. No acute events overnight.    Objective:   Filed Vitals:   02/20/16 1955 02/20/16 2332 02/21/16 0251 02/21/16 0405  BP: 121/83 105/76 113/80 108/88  Pulse:  89 93 88  Temp:   97.6 F (36.4 C) 98.2 F (36.8 C)  TempSrc:   Oral Oral  Resp:  16 19 19   Height:   5\' 7"  (1.702 m)   Weight:   104.101 kg (229 lb 8 oz)   SpO2:  100% 100% 100%   No intake or output data in the 24 hours ending 02/21/16 1417   Wt Readings from Last 3 Encounters:  02/21/16 104.101 kg (229 lb 8 oz)  02/14/16 104.327 kg (230 lb)  12/02/15 102.785 kg (226 lb 9.6 oz)     Exam  General: Alert and oriented x 3, NAD  HEENT:  PERRLA, EOMI, Anicteric Sclera, mucous membranes moist.   Neck: Supple, no JVD, no masses  CVS: S1 S2 auscultated, no rubs, murmurs or gallops. Regular rate and rhythm.  Respiratory: Clear to auscultation bilaterally, no wheezing, rales or rhonchi  Abdomen: Soft, nontender, nondistended, + bowel sounds, PD catheter no tenderness around the site  Ext: no cyanosis clubbing or edema, left lower extremity slight erythema with induration, significantly improved from admission per patient  Neuro: AAOx3, Cr N's II- XII. Strength 5/5 upper and lower extremities bilaterally  Skin: No rashes  Psych: Normal affect and demeanor, alert and oriented x3    Data Reviewed:  I have personally reviewed following labs and imaging studies  Micro Results No results found for this or any previous visit (from the past 240 hour(s)).  Radiology Reports No results found.  CBC  Recent Labs Lab 02/20/16 1540  WBC 6.0  HGB 11.3*  HCT 35.0*  PLT 330  MCV 84.1  MCH 27.2  MCHC 32.3  RDW 15.6*    Chemistries   Recent Labs Lab  02/20/16 1540  NA 139  K 3.7  CL 95*  CO2 24  GLUCOSE 93  BUN 63*  CREATININE 19.31*  CALCIUM 9.5  AST 19  ALT 17  ALKPHOS 68  BILITOT 0.5   ------------------------------------------------------------------------------------------------------------------ estimated creatinine clearance is  5 mL/min (by C-G formula based on Cr of 19.31). ------------------------------------------------------------------------------------------------------------------ No results for input(s): HGBA1C in the last 72 hours. ------------------------------------------------------------------------------------------------------------------ No results for input(s): CHOL, HDL, LDLCALC, TRIG, CHOLHDL, LDLDIRECT in the last 72 hours. ------------------------------------------------------------------------------------------------------------------ No results for input(s): TSH, T4TOTAL, T3FREE, THYROIDAB in the last 72 hours.  Invalid input(s): FREET3 ------------------------------------------------------------------------------------------------------------------ No results for input(s): VITAMINB12, FOLATE, FERRITIN, TIBC, IRON, RETICCTPCT in the last 72 hours.  Coagulation profile No results for input(s): INR, PROTIME in the last 168 hours.  No results for input(s): DDIMER in the last 72 hours.  Cardiac Enzymes No results for input(s): CKMB, TROPONINI, MYOGLOBIN in the last 168 hours.  Invalid input(s): CK ------------------------------------------------------------------------------------------------------------------ Invalid input(s): POCBNP  No results for input(s): GLUCAP in the last 72 hours.   RAI,RIPUDEEP M.D. [facility] 02/21/2016, 2:17 PM  Pager: [phone] Between 7am to 7pm - call Pager - [phone]  After 7pm go to [email or web address] - password [email or web address]  Call night coverage person covering after 7pm

## 2016-02-21 NOTE — ED Notes (Signed)
Bed: WA12 Expected date:  Expected time:  Means of arrival:  Comments: Pt in room 

## 2016-02-21 NOTE — Consult Note (Signed)
Renal Service Consult Note [facility]  MITZI BARNEY 02/21/2016 Sol Blazing Requesting Physician:  Dr Tana Coast  Reason for Consult:  ESRD pt with abd pain, n/v HPI: The patient is a 35 y.o. year-old with hx of SLE nephritis and ESRD , started peritoneal dialysis in Jan 2017.  Presents with nausea/ vomiting for 4-5 days, also night sweats, fevers at home and some abd discomfort/ stomach "ache".  Patient states that about 4-5 nights ago she noticed nausea on the 2nd or 3rd fill and it would get worse with each fill.  This happened again the next night w PD.  Also had pain w inflow.  Then nausea stayed all day.  She went to renal MD on 4/28, 3 days ago, and fluid was tested showing cell count of 130 but only 19% PMN's, for total of about 25 pmn per ul.  Symptoms didn't improve and she presented to ED last night. In ED VS were stable, temp 98.4 and BP 121/83 w HR 90-114.  R 18. No fevers overnight.    Patient has hx SLE x 10 yrs.  Had a severe case per pt, lost all her hair (wears a wig now), severe skin issues w hands and arms, arthralgia, etc.  She was rx with prednisone and plaquenil for 4-5 yrs, then was dx'd with renal disease and Cellcept was added about 5-6 yrs ago.  She remains on these medications, f/b rheumatology.    Works as Systems developer.  Single, no children, no tob/ etoh.  Used to have HTN but since starting PD says that BP's are lower and she only takes BP medication if her BP is up.    PD regimen : 4 dwells overnight, 2 hrs each, 3000 cc volume.  No day bag and no pause.  No hx peritonitis.     ROS  denies CP  no joint pain   no HA  no blurry vision  no rash  no diarrhea  no dysuria   Past Medical History  Past Medical History  Diagnosis Date  . Unspecified essential hypertension   . Unspecified deficiency anemia   . Systemic lupus erythematosus ([place])   . Secondary cardiomyopathy, unspecified   . ERYTHEMATOSUS, LUPUS 08/07/2006  .  CHF (congestive heart failure) ([place]) 5-6 yrs ago  hosp  . Chronic kidney disease   . Intestinal infection due to Clostridium difficile    Past Surgical History  Past Surgical History  Procedure Laterality Date  . Kidney biopsies      to determine lupus nephritis   Family History  Family History  Problem Relation Age of Onset  . Lupus Mother   . Cancer Maternal Grandmother     unknown  . Prostate cancer Paternal Grandfather   . Diabetes Brother   . Diabetes      mat cousin   Social History  reports that she has never smoked. She has never used smokeless tobacco. She reports that she drinks alcohol. She reports that she does not use illicit drugs. Allergies  Allergies  Allergen Reactions  . Lisinopril Swelling  . Nsaids     She should avoid all due to renal insufficiency   Home medications Prior to Admission medications   Medication Sig Start Date End Date Taking? Authorizing Provider  allopurinol (ZYLOPRIM) 300 MG tablet TAKE 1 TABLET BY MOUTH DAILY 06/07/15  Yes Marin Olp, MD  Belimumab (BENLYSTA IV) Inject into the vein every 14 (fourteen) days.    Yes  Historical Provider, MD  calcitRIOL (ROCALTROL) 0.25 MCG capsule Take 0.25 mcg by mouth daily.    Yes Historical Provider, MD  cephALEXin (KEFLEX) 250 MG capsule Take 1 capsule (250 mg total) by mouth 2 (two) times daily. 02/14/16  Yes Marin Olp, MD  cloNIDine (CATAPRES - DOSED IN MG/24 HR) 0.2 mg/24hr patch Place 1 patch (0.2 mg total) onto the skin once a week. PATIENT NEEDS TO CONTACT OFFICE FOR ADDITIONAL REFILLS 10/08/15  Yes Minus Breeding, MD  desonide (DESOWEN) 0.05 % ointment Apply 1 application topically daily.  09/15/14  Yes Historical Provider, MD  diltiazem (TIAZAC) 360 MG 24 hr capsule Take 360 mg by mouth daily.  09/28/14  Yes Historical Provider, MD  doxycycline (VIBRA-TABS) 100 MG tablet Take 1 tablet (100 mg total) by mouth 2 (two) times daily. 02/14/16  Yes Marin Olp, MD  gentamicin cream  (GARAMYCIN) 0.1 % Apply 1 application topically 3 (three) times daily as needed. For cleaning exit site. 12/28/15  Yes Historical Provider, MD  hydrALAZINE (APRESOLINE) 50 MG tablet Take 50 mg by mouth 3 (three) times daily.   Yes Historical Provider, MD  hydroxychloroquine (PLAQUENIL) 200 MG tablet TAKE 1 TABLET BY MOUTH TWICE A DAY 10/26/14  Yes Bruce Kendall Flack, MD  labetalol (NORMODYNE) 200 MG tablet Take 1 tablet (200 mg total) by mouth 3 (three) times daily. <PLEASE MAKE APPOINTMENT FOR REFILLS> 02/07/16  Yes Minus Breeding, MD  mycophenolate (CELLCEPT) 500 MG tablet Take 1,000 mg by mouth 2 (two) times daily.    Yes Historical Provider, MD  predniSONE (DELTASONE) 10 MG tablet Take 5 mg by mouth daily.  01/26/16  Yes Historical Provider, MD  RENVELA 800 MG tablet Take 1,600-3,200 mg by mouth as directed. 4 tablet by mouth three times daily with meals and 1-2 tablets with snacks. 01/26/16  Yes Historical Provider, MD  torsemide (DEMADEX) 100 MG tablet Take 100 mg by mouth every morning. 11/16/15  Yes Historical Provider, MD  valACYclovir (VALTREX) 1000 MG tablet Take 1,000 mg by mouth daily as needed (for flares.). Reported on 02/14/2016 10/29/13  Yes Historical Provider, MD   Liver Function Tests  Recent Labs Lab 02/20/16 1540  AST 19  ALT 17  ALKPHOS 68  BILITOT 0.5  PROT 7.2  ALBUMIN 3.6   No results for input(s): LIPASE, AMYLASE in the last 168 hours. CBC  Recent Labs Lab 02/20/16 1540  WBC 6.0  HGB 11.3*  HCT 35.0*  MCV 84.1  PLT [id number]   Basic Metabolic Panel  Recent Labs Lab 02/20/16 1540  NA 139  K 3.7  CL 95*  CO2 24  GLUCOSE 93  BUN 63*  CREATININE 19.31*  CALCIUM 9.5    Filed Vitals:   02/20/16 1955 02/20/16 2332 02/21/16 0251 02/21/16 0405  BP: 121/83 105/76 113/80 108/88  Pulse:  89 93 88  Temp:   97.6 F (36.4 C) 98.2 F (36.8 C)  TempSrc:   Oral Oral  Resp:  16 19 19   Height:   5\' 7"  (1.702 m)   Weight:   104.101 kg (229 lb 8 oz)   SpO2:  100% 100% 100%    Exam Alert in no distress No rash, cyanosis or gangrene Sclera anicteric, throat clear  No jvd or bruits Chest clear bilat RRR no MRG Abd soft ntnd no mass or ascites +bs  LLQ pd cath exit site is clean and dry GU defer MS no joint effusions or deformity Ext no LE edema / no wounds  or ulcers Neuro is alert, Ox 3 , nf   Na 139  K 3.7  BUN 63  Creat 19  WBC 6k  Hb 11.3  UA cloudy few bact 0-5 rbc, 6-30 wbc, 0-5 epis   Assessment: 1. Abd pain /N/V - outpatient PD fluid from 4/28 w cell count not c/w peritonitis.  Symptoms suspicious for peritonitis, however.  Will repeat cell count with a single dwell this afternoon , then resume CCPD tonight.  Is on empiric IV abx now.  F/U outpt cultures as well.  2. ESRD d/t SLE on CCPD 3. SLE on cellcept/pred/ plaquenil 4. Vol is ok 5. HTN - BP meds on hold, bp's soft 6. MBD cont renvela/ vit D   Plan - as above  Kelly Splinter MD [facility] pager [phone]    cell [phone] 02/21/2016, 1:14 PM

## 2016-02-21 NOTE — Progress Notes (Signed)
02/21/2016 3:02 PM  Pt transferred from 6N10 to 6E18 via wheelchair accompanied by friend.  Pt fully alert and oriented, denies pain, skin intact.  Full assessment to flow sheets.  Pt denies fall within the past six months.  Vitals stable, non-telemetry.  CAPD exchange completed at this time as well.  Pt oriented to room/unit, and was instructed on how to utilize the call bell, to which she verbalized understanding.  Will continue to monitor patient. Kealy Lewter Brooke  

## 2016-02-21 NOTE — Progress Notes (Signed)
Report given to [facility], RN [phone])

## 2016-02-21 NOTE — Progress Notes (Signed)
Patient was transferred to [id number].

## 2016-02-21 NOTE — Progress Notes (Signed)
Pharmacy Antibiotic Follow-up Note  [name] [name] is a 35 y.o. year-old female admitted on 02/20/2016.  The patient is currently on day 1 of Vancomycin & Zosyn for rule out peritonitis, UTI. Lower extremity cellulitis treated with Doxycycline & Keflex PTA. Hx of Lupus, in ESRD using peritoneal dialysis, 5 day hx of N/V.   Assessment/Plan: Vancomycin 1gm x1 [facility] ED, would plan for 2gm load, then follow random level Zosyn 3.375gm over 30 min x1, then 2.25gm q8hr  - Allopurinol 300mg  daily as home dose, had dose reduced to 100mg  daily with ARF - Mycophenolate 1000mg  bid,   Temp (24hrs), Avg:98.5 F (36.9 C), Min:98.4 F (36.9 C), Max:98.5 F (36.9 C)   Recent Labs Lab 02/20/16 1540  WBC 6.0    Recent Labs Lab 02/20/16 1540  CREATININE 19.31*   Estimated Creatinine Clearance: 5 mL/min (by C-G formula based on Cr of 19.31).    Allergies  Allergen Reactions  . Lisinopril Swelling  . Nsaids     She should avoid all due to renal insufficiency   Antimicrobials this admission: 5/1 Vancomycin >> 5/1  Zosyn >>  Levels/dose changes this admission:  Microbiology results: 4/30 UCx: sent  Thank you for allowing pharmacy to be a part of this patient's care.  Minda Ditto PharmD 02/21/2016 12:13 AM

## 2016-02-22 ENCOUNTER — Inpatient Hospital Stay (HOSPITAL_COMMUNITY): Payer: BLUE CROSS/BLUE SHIELD

## 2016-02-22 DIAGNOSIS — L930 Discoid lupus erythematosus: Secondary | ICD-10-CM

## 2016-02-22 DIAGNOSIS — N186 End stage renal disease: Secondary | ICD-10-CM

## 2016-02-22 DIAGNOSIS — I10 Essential (primary) hypertension: Secondary | ICD-10-CM

## 2016-02-22 DIAGNOSIS — Z992 Dependence on renal dialysis: Secondary | ICD-10-CM

## 2016-02-22 LAB — URINE CULTURE: Culture: NO GROWTH

## 2016-02-22 LAB — BODY FLUID CELL COUNT WITH DIFFERENTIAL
Eos, Fluid: 2 %
Lymphs, Fluid: 14 %
Monocyte-Macrophage-Serous Fluid: 40 % — ABNORMAL LOW (ref 50–90)
NEUTROPHIL FLUID: 44 % — AB (ref 0–25)
WBC FLUID: 11 uL (ref 0–1000)

## 2016-02-22 LAB — VANCOMYCIN, RANDOM: Vancomycin Rm: 44 ug/mL

## 2016-02-22 LAB — PATHOLOGIST SMEAR REVIEW

## 2016-02-22 MED ORDER — PROCHLORPERAZINE EDISYLATE 5 MG/ML IJ SOLN
5.0000 mg | INTRAMUSCULAR | Status: DC | PRN
  Administered 2016-02-22 – 2016-02-23 (×3): 5 mg via INTRAVENOUS
  Filled 2016-02-22 (×3): qty 2

## 2016-02-22 MED ORDER — PANTOPRAZOLE SODIUM 40 MG PO TBEC
40.0000 mg | DELAYED_RELEASE_TABLET | Freq: Every day | ORAL | Status: DC
  Administered 2016-02-23 (×2): 40 mg via ORAL
  Filled 2016-02-22 (×2): qty 1

## 2016-02-22 MED ORDER — PIPERACILLIN-TAZOBACTAM IN DEX 2-0.25 GM/50ML IV SOLN
2.2500 g | Freq: Two times a day (BID) | INTRAVENOUS | Status: DC
  Administered 2016-02-22 – 2016-02-23 (×4): 2.25 g via INTRAVENOUS
  Filled 2016-02-22 (×7): qty 50

## 2016-02-22 NOTE — Progress Notes (Signed)
  [facility] Progress Note   Subjective: not having a good day, vomiting repeatedly   Filed Vitals:   02/21/16 1450 02/21/16 2150 02/22/16 0600 02/22/16 0752  BP: 116/83 121/79 118/78 140/103  Pulse: 88 99 90 90  Temp: 98.8 F (37.1 C) 98.1 F (36.7 C) 98 F (36.7 C) 98.4 F (36.9 C)  TempSrc: Oral Oral Oral Oral  Resp: 18 18 20 18   Height:      Weight:  103.1 kg (227 lb 4.7 oz)    SpO2: 100% 100% 100% 100%    Inpatient medications: . allopurinol  300 mg Oral Daily  . calcitRIOL  0.25 mcg Oral Daily  . dialysis solution 1.5% low-MG/low-CA   Intraperitoneal Q24H  . enoxaparin (LOVENOX) injection  30 mg Subcutaneous Q24H  . gentamicin cream  1 application Topical Daily  . hydrocortisone   Topical BID  . hydroxychloroquine  200 mg Oral BID  . mycophenolate  1,000 mg Oral BID  . pantoprazole  40 mg Oral QHS  . piperacillin-tazobactam (ZOSYN)  IV  2.25 g Intravenous Q12H  . predniSONE  5 mg Oral Q breakfast  . sevelamer carbonate  3,200 mg Oral TID WC     heparin, ondansetron **OR** ondansetron (ZOFRAN) IV, valACYclovir  Exam: Alert in no distress No rash, cyanosis or gangrene Sclera anicteric, throat clear  No jvd or bruits Chest clear bilat RRR no MRG Abd soft ntnd no mass or ascites +bs LLQ pd cath exit site is clean and dry GU defer MS no joint effusions or deformity Ext no LE edema / no wounds or ulcers Neuro is alert, Ox 3 , nf  PD regimen : 4 dwells overnight, 2 hrs each, 3000 cc volume. No day bag and no pause. Started on PD in Jan 2017.  No hx peritonitis  Na 139 K 3.7 BUN 63 Creat 19 WBC 6k Hb 11.3 UA cloudy few bact 0-5 rbc, 6-30 wbc, 0-5 epis Cell count on 4/28 > 130 w 19%pmns, culture - no growth Cell count yest 73 w 37% pmns, pink/ cloudy Cell count today 11 w 30% pmns, clear   Assessment: 1. Abd pain /N/V - outpatient PD fluid and fluid here not consistent with peritonitis.  Her creat is quite high , could be uremic.   Will d/w her primary nephrologist.  Cont CCPD for now, f/u creat in am.  CT abd today pending.  2. ESRD d/t SLE on CCPD 3. SLE on cellcept/pred/ plaquenil 4. Vol is ok 5. HTN/volume - BP meds on hold, bp's soft, will start IVF's 6. MBD cont renvela/ vit D  Plan - as above   Kelly Splinter MD [facility] pager [phone]    cell [phone] 02/22/2016, 1:23 PM    Recent Labs Lab 02/20/16 1540  NA 139  K 3.7  CL 95*  CO2 24  GLUCOSE 93  BUN 63*  CREATININE 19.31*  CALCIUM 9.5    Recent Labs Lab 02/20/16 1540  AST 19  ALT 17  ALKPHOS 68  BILITOT 0.5  PROT 7.2  ALBUMIN 3.6    Recent Labs Lab 02/20/16 1540  WBC 6.0  HGB 11.3*  HCT 35.0*  MCV 84.1  PLT 330

## 2016-02-22 NOTE — Progress Notes (Signed)
[facility]                                                                              Patient Demographics  [name] [name], is a 35 y.o. female, DOB - 31-Jul-1981, XN:[id number]  Admit date - 02/20/2016   Admitting Physician Gennaro Africa, MD  Outpatient Primary MD for the patient is Garret Reddish, MD  Outpatient specialists:   LOS - 1  days    Chief Complaint  Patient presents with  . Abdominal Pain  . Nausea       Brief summary   Patient is a 35 yo female with history of HTN, ESRD on PD, lupus who came with cc of nausea and vomiting for the past 4 days. Vomitus is non bloody. Both were associated mainly with PD and after finishing PD every night associated with mild diffuse abdominal pain w/o dysuria or diarrhea. She has had cellulitis in left lower extremity diagnosed 5 days ago and was prescribed doxy/keflex which likely did not help as she was vomiting. She had mild fever up to 100.9 with occasional chills.    Assessment & Plan   Abdominal pain with Nausea, vomiting: Possibly due to doxycycline or gastritis or viral GE, Unclear etiology - Patient continues to complain of nausea and vomiting, abdominal pain - Outpatient PD fluid from 4/28 not consistent with peritonitis however will repeat cell count and culture - CT abdomen and pelvis showed no hydronephrosis or hydroureter, small perihepatic ascites, otherwise no small bowel obstruction normal appendix and no colitis or diverticulitis. Hepatobiliary system showed no gallstones, lung bases are unremarkable - Patient does not express gastroparesis type of symptoms - She did have diarrhea today, ordered GI pathogen panel, C. difficile  Cellulitis left lower extremities - Improving - Continue IV vancomycin and Zosyn  ESRD on peritoneal dialysis - Nephrology consulted, appreciate recommendations  SLE - Continue CellCept, prednisone, Plaquenil  Hypertension -BP stable today  Code Status:  FC  Family Communication: Discussed in detail with the patient, all imaging results, lab results explained to the patient   Disposition Plan:   Time Spent in minutes  25 minutes  Procedures  PD  Consults   Renal   DVT Prophylaxis  Lovenox *  Medications  Scheduled Meds: . allopurinol  300 mg Oral Daily  . calcitRIOL  0.25 mcg Oral Daily  . dialysis solution 1.5% low-MG/low-CA   Intraperitoneal Q24H  . enoxaparin (LOVENOX) injection  30 mg Subcutaneous Q24H  . gentamicin cream  1 application Topical Daily  . hydrocortisone   Topical BID  . hydroxychloroquine  200 mg Oral BID  . mycophenolate  1,000 mg Oral BID  . pantoprazole  40 mg Oral QHS  . piperacillin-tazobactam (ZOSYN)  IV  2.25 g Intravenous Q12H  . predniSONE  5 mg Oral Q breakfast  . sevelamer carbonate  3,200 mg Oral TID WC   Continuous Infusions:  PRN Meds:.heparin, ondansetron **OR** ondansetron (ZOFRAN) IV, valACYclovir   Antibiotics   Anti-infectives    Start     Dose/Rate Route Frequency Ordered Stop   02/22/16 1000  piperacillin-tazobactam (ZOSYN) IVPB 2.25 g     2.25  g 100 mL/hr over 30 Minutes Intravenous Every 12 hours 02/22/16 0836     02/21/16 1000  hydroxychloroquine (PLAQUENIL) tablet 200 mg     200 mg Oral 2 times daily 02/20/16 2308     02/21/16 0800  piperacillin-tazobactam (ZOSYN) IVPB 2.25 g  Status:  Discontinued     2.25 g 100 mL/hr over 30 Minutes Intravenous Every 8 hours 02/21/16 0124 02/22/16 0836   02/21/16 0400  vancomycin (VANCOCIN) IVPB 1000 mg/200 mL premix     1,000 mg 200 mL/hr over 60 Minutes Intravenous  Once 02/21/16 0327 02/21/16 0504   02/21/16 0000  piperacillin-tazobactam (ZOSYN) IVPB 3.375 g     3.375 g 100 mL/hr over 30 Minutes Intravenous  Once 02/20/16 2355 02/21/16 0052   02/21/16 0000  vancomycin (VANCOCIN) IVPB 1000 mg/200 mL premix     1,000 mg 200 mL/hr over 60 Minutes Intravenous  Once 02/20/16 2355 02/21/16 0202   02/20/16 2330  cefTRIAXone (ROCEPHIN)  1 g in dextrose 5 % 50 mL IVPB  Status:  Discontinued     1 g 100 mL/hr over 30 Minutes Intravenous Every 24 hours 02/20/16 2325 02/20/16 2351   02/20/16 2315  doxycycline (VIBRA-TABS) tablet 100 mg  Status:  Discontinued     100 mg Oral 2 times daily 02/20/16 2308 02/20/16 2316   02/20/16 2308  valACYclovir (VALTREX) tablet 1,000 mg     1,000 mg Oral Daily PRN 02/20/16 2308          Subjective:   [name] [name] was seen and examined today. Continues to complain of nausea and vomiting, no fevers or chills. Had one episode of diarrhea this morning. Left leg cellulitis is improving. Patient denies dizziness, chest pain, shortness of breath, new weakness, numbess, tingling. No acute events overnight.    Objective:   Filed Vitals:   02/21/16 1450 02/21/16 2150 02/22/16 0600 02/22/16 0752  BP: 116/83 121/79 118/78 140/103  Pulse: 88 99 90 90  Temp: 98.8 F (37.1 C) 98.1 F (36.7 C) 98 F (36.7 C) 98.4 F (36.9 C)  TempSrc: Oral Oral Oral Oral  Resp: 18 18 20 18   Height:      Weight:  103.1 kg (227 lb 4.7 oz)    SpO2: 100% 100% 100% 100%    Intake/Output Summary (Last 24 hours) at 02/22/16 1319 Last data filed at 02/22/16 1003  Gross per 24 hour  Intake  14460 ml  Output   1900 ml  Net  12560 ml     Wt Readings from Last 3 Encounters:  02/21/16 103.1 kg (227 lb 4.7 oz)  02/14/16 104.327 kg (230 lb)  12/02/15 102.785 kg (226 lb 9.6 oz)     Exam  General: Alert and oriented x 3, NAD  HEENT:    Neck:  CVS: S1 S2 auscultated, no rubs, murmurs or gallops. Regular rate and rhythm.  Respiratory: Clear to auscultation bilaterally, no wheezing, rales or rhonchi  Abdomen: Soft, nontender, nondistended, + bowel sounds, PD catheter no tenderness around the site  Ext: no cyanosis clubbing or edema, LLE cellulitis improving   Neuro: no new deficits  Skin: No rashes  Psych: Normal affect and demeanor, alert and oriented x3    Data Reviewed:  I have personally  reviewed following labs and imaging studies  Micro Results Recent Results (from the past 240 hour(s))  Culture, Urine     Status: None   Collection Time: 02/20/16  8:12 PM  Result Value Ref Range Status   Specimen  Description URINE, RANDOM  Final   Special Requests NONE  Final   Culture   Final    NO GROWTH 1 DAY Performed at [facility]    Report Status 02/22/2016 FINAL  Final  Body fluid culture     Status: None (Preliminary result)   Collection Time: 02/21/16  6:30 PM  Result Value Ref Range Status   Specimen Description FLUID PERITONEAL  Final   Special Requests Immunocompromised  Final   Gram Stain   Final    RARE WBC PRESENT,BOTH PMN AND MONONUCLEAR NO ORGANISMS SEEN    Culture NO GROWTH < 12 HOURS  Final   Report Status PENDING  Incomplete    Radiology Reports Ct Abdomen Pelvis Wo Contrast  02/22/2016  CLINICAL DATA:  Recurrent nausea and vomiting for the last 6 days, upper abdominal pain last few days EXAM: CT ABDOMEN AND PELVIS WITHOUT CONTRAST TECHNIQUE: Multidetector CT imaging of the abdomen and pelvis was performed following the standard protocol without IV contrast. COMPARISON:  Images of the upper abdomen CT scan 06/28/2010 FINDINGS: Lower chest: Small hiatal hernia is noted. Lung bases are unremarkable. Hepatobiliary: The unenhanced liver shows no biliary ductal dilatation. No calcified gallstones are noted within gallbladder. Small amount of perihepatic ascites. Pancreas: Unenhanced pancreas is unremarkable. Spleen: Unenhanced spleen is unremarkable. Adrenals/Urinary Tract: No adrenal gland mass. Unenhanced kidneys are symmetrical in size. No hydronephrosis or hydroureter. No nephrolithiasis. No calcified ureteral calculi. No calcified calculi are noted within under distended urinary bladder. Stomach/Bowel: There is no gastric outlet obstruction. No small bowel obstruction. No thickened or dilated small bowel loops are noted. There is a left peritoneal dialysis  catheter entering left upper abdominal wall with tip coiled within anterior pelvis. No pericecal inflammation. Normal appendix noted in axial image 66. No colitis or diverticulitis. No distal colonic obstruction. Vascular/Lymphatic: No retroperitoneal or mesenteric adenopathy. No aortic aneurysm. Reproductive: The unenhanced uterus and ovaries are unremarkable. Small pelvic free fluid is noted. Other: There is small pelvic ascites. Small ascites is noted in right lower quadrant. Small perihepatic ascites. Small amount of free air in upper abdomen most likely from peritoneal dialysis. No mesenteric abscess. The there is no inguinal adenopathy. Musculoskeletal: Sagittal images of the spine shows no destructive bony lesions. No destructive bony lesions are noted within pelvis. IMPRESSION: 1. No nephrolithiasis.  No hydronephrosis or hydroureter. 2. A left anterior abdominal peritoneal dialysis catheter is noted. Small amount of free air in upper abdomen most likely from peritoneal dialysis. There is small perihepatic ascites. Small ascites is noted in right lower quadrant anteriorly. Small pelvic ascites. 3. No small bowel obstruction. No pericecal inflammation. Normal appendix. 4. No evidence of colitis or diverticulitis. 5. Unremarkable unenhanced uterus and ovaries. Electronically Signed   By: Lahoma Crocker M.D.   On: 02/22/2016 12:02    CBC  Recent Labs Lab 02/20/16 1540  WBC 6.0  HGB 11.3*  HCT 35.0*  PLT 330  MCV 84.1  MCH 27.2  MCHC 32.3  RDW 15.6*    Chemistries   Recent Labs Lab 02/20/16 1540  NA 139  K 3.7  CL 95*  CO2 24  GLUCOSE 93  BUN 63*  CREATININE 19.31*  CALCIUM 9.5  AST 19  ALT 17  ALKPHOS 68  BILITOT 0.5   ------------------------------------------------------------------------------------------------------------------ estimated creatinine clearance is 5 mL/min (by C-G formula based on Cr of  19.31). ------------------------------------------------------------------------------------------------------------------ No results for input(s): HGBA1C in the last 72 hours. ------------------------------------------------------------------------------------------------------------------ No results for input(s): CHOL, HDL, LDLCALC,  TRIG, CHOLHDL, LDLDIRECT in the last 72 hours. ------------------------------------------------------------------------------------------------------------------ No results for input(s): TSH, T4TOTAL, T3FREE, THYROIDAB in the last 72 hours.  Invalid input(s): FREET3 ------------------------------------------------------------------------------------------------------------------ No results for input(s): VITAMINB12, FOLATE, FERRITIN, TIBC, IRON, RETICCTPCT in the last 72 hours.  Coagulation profile No results for input(s): INR, PROTIME in the last 168 hours.  No results for input(s): DDIMER in the last 72 hours.  Cardiac Enzymes No results for input(s): CKMB, TROPONINI, MYOGLOBIN in the last 168 hours.  Invalid input(s): CK ------------------------------------------------------------------------------------------------------------------ Invalid input(s): POCBNP  No results for input(s): GLUCAP in the last 72 hours.   [name] [name] M.D. [facility] 02/22/2016, 1:19 PM  Pager: [phone] Between 7am to 7pm - call Pager - [phone]  After 7pm go to [email or web address] - password [email or web address]  Call night coverage person covering after 7pm

## 2016-02-22 NOTE — Progress Notes (Addendum)
Pharmacy Antibiotic Note  [name] [name] is a 35 y.o. year-old female admitted on 02/20/2016.  The patient is currently on day 2 of Vancomycin & Zosyn for rule out peritonitis, UTI, lower extremity cellulitis. She has ESRD on CCPD.  She received a vancomycin 2g total load on 5/1 A random vancomycin level drawn with AM labs this morning was 74mcg/mL.  Assessment/Plan: -no need to redose vancomycin at this time. Will recheck another VR on 5/4 with AM labs (72h from completed load) -Zosyn 2.25g q12h for PD dosing -Follow renal plans, c/s, clinical progression  Temp (24hrs), Avg:98.3 F (36.8 C), Min:98 F (36.7 C), Max:98.8 F (37.1 C)   Recent Labs Lab 02/20/16 1540  WBC 6.0     Recent Labs Lab 02/20/16 1540  CREATININE 19.31*   Estimated Creatinine Clearance: 5 mL/min (by C-G formula based on Cr of 19.31).    Allergies  Allergen Reactions  . Lisinopril Swelling  . Nsaids     She should avoid all due to renal insufficiency   Antimicrobials this admission: Vancomycin 5/1>> Zosyn 5/1>>  Levels/dose changes this admission: 5/1 VR = 70mcg/mL - 24h after 2g load  Microbiology results: 4/30 UCx: sent 4/30 Body fluid: ip  Thank you for allowing pharmacy to be a part of this patient's care.  Arsenio Schnorr D. Clarie Camey, PharmD, BCPS Clinical Pharmacist Pager: [phone] 02/22/2016 8:30 AM

## 2016-02-23 LAB — BASIC METABOLIC PANEL
ANION GAP: 18 — AB (ref 5–15)
BUN: 52 mg/dL — ABNORMAL HIGH (ref 6–20)
CHLORIDE: 99 mmol/L — AB (ref 101–111)
CO2: 23 mmol/L (ref 22–32)
CREATININE: 17.6 mg/dL — AB (ref 0.44–1.00)
Calcium: 9.2 mg/dL (ref 8.9–10.3)
GFR calc non Af Amer: 2 mL/min — ABNORMAL LOW (ref >60)
GFR, EST AFRICAN AMERICAN: 3 mL/min — AB (ref >60)
Glucose, Bld: 96 mg/dL (ref 65–99)
POTASSIUM: 3.3 mmol/L — AB (ref 3.5–5.1)
SODIUM: 140 mmol/L (ref 135–145)

## 2016-02-23 LAB — PATHOLOGIST SMEAR REVIEW

## 2016-02-23 LAB — C DIFFICILE QUICK SCREEN W PCR REFLEX
C Diff antigen: NEGATIVE
C Diff interpretation: NEGATIVE
C Diff toxin: NEGATIVE

## 2016-02-23 MED ORDER — DILTIAZEM HCL ER COATED BEADS 180 MG PO CP24
360.0000 mg | ORAL_CAPSULE | Freq: Every day | ORAL | Status: DC
  Administered 2016-02-23 – 2016-02-24 (×2): 360 mg via ORAL
  Filled 2016-02-23 (×2): qty 2

## 2016-02-23 MED ORDER — CLONIDINE HCL 0.2 MG/24HR TD PTWK
0.2000 mg | MEDICATED_PATCH | TRANSDERMAL | Status: DC
  Administered 2016-02-23: 0.2 mg via TRANSDERMAL
  Filled 2016-02-23: qty 1

## 2016-02-23 NOTE — Progress Notes (Addendum)
  [facility] Progress Note   Subjective: vomiting / nausea "much better". Creat down 17.   Filed Vitals:   02/22/16 1643 02/22/16 2222 02/23/16 0551 02/23/16 0900  BP: 146/93 167/109 151/102 174/103  Pulse: 95 92 95 95  Temp: 98.5 F (36.9 C) 98.3 F (36.8 C) 98.5 F (36.9 C) 98 F (36.7 C)  TempSrc: Oral Oral Oral Oral  Resp: 17 18 18 18   Height:      Weight:  103.2 kg (227 lb 8.2 oz)    SpO2: 100% 97% 96% 96%    Inpatient medications: . allopurinol  300 mg Oral Daily  . calcitRIOL  0.25 mcg Oral Daily  . dialysis solution 1.5% low-MG/low-CA   Intraperitoneal Q24H  . enoxaparin (LOVENOX) injection  30 mg Subcutaneous Q24H  . gentamicin cream  1 application Topical Daily  . hydrocortisone   Topical BID  . hydroxychloroquine  200 mg Oral BID  . mycophenolate  1,000 mg Oral BID  . pantoprazole  40 mg Oral QHS  . piperacillin-tazobactam (ZOSYN)  IV  2.25 g Intravenous Q12H  . predniSONE  5 mg Oral Q breakfast  . sevelamer carbonate  3,200 mg Oral TID WC     heparin, ondansetron **OR** ondansetron (ZOFRAN) IV, prochlorperazine, valACYclovir  Exam: Alert in no distress No rash, cyanosis or gangrene Sclera anicteric, throat clear  No jvd or bruits Chest clear bilat RRR no MRG Abd soft ntnd no mass or ascites +bs LLQ pd cath exit site is clean and dry GU defer MS no joint effusions or deformity Ext no LE edema / no wounds or ulcers Neuro is alert, Ox 3 , nf  PD regimen : 4 dwells overnight, 2 hrs each, 3000 cc volume. No day bag and no pause. Started on PD in Jan 2017.  No hx peritonitis  Na 139 K 3.7 BUN 63 Creat 19 WBC 6k Hb 11.3 UA cloudy few bact 0-5 rbc, 6-30 wbc, 0-5 epis Cell count on 4/28 > 130 w 19%pmns, culture - no growth Cell count yest 73 w 37% pmns, pink/ cloudy Cell count today 11 w 30% pmns, clear   Assessment: 1. Abd pain /N/V - outpatient PD fluid and fluid here not consistent with peritonitis.  Better today, possible  uremia from underdialysis. Have d/w Dr. Joelyn Oms, pt's primary neph, he will f/u in OP setting and make any prescriptions changes as needed.   2. ESRD d/t SLE on CCPD 3. SLE on cellcept/pred/ plaquenil 4. Vol is ok 5. HTN/volume - bp's now high, will resume some of her BP meds (clon patch, diltiazem). Also on labet/ hydral at home, still holding these 6. MBD cont renvela/ vit D 7. Dispo - stable for dc from our standpoint  Plan - as above   Kelly Splinter MD [place] pager [phone]    cell [phone] 02/23/2016, 2:14 PM    Recent Labs Lab 02/20/16 1540 02/23/16 0446  NA 139 140  K 3.7 3.3*  CL 95* 99*  CO2 24 23  GLUCOSE 93 96  BUN 63* 52*  CREATININE 19.31* 17.60*  CALCIUM 9.5 9.2    Recent Labs Lab 02/20/16 1540  AST 19  ALT 17  ALKPHOS 68  BILITOT 0.5  PROT 7.2  ALBUMIN 3.6    Recent Labs Lab 02/20/16 1540  WBC 6.0  HGB 11.3*  HCT 35.0*  MCV 84.1  PLT 330

## 2016-02-23 NOTE — Progress Notes (Signed)
[facility]                                                                              Patient Demographics  [name] [name], is a 35 y.o. female, DOB - [birth year], [id number]  Admit date - 02/20/2016   Admitting Physician Gennaro Africa, MD  Outpatient Primary MD for the patient is Garret Reddish, MD  Outpatient specialists:   LOS - 2  days    Chief Complaint  Patient presents with  . Abdominal Pain  . Nausea       Brief summary   Patient is a 35 yo female with history of HTN, ESRD on PD, lupus who came with cc of nausea and vomiting for the past 4 days. Vomitus is non bloody. Both were associated mainly with PD and after finishing PD every night associated with mild diffuse abdominal pain w/o dysuria or diarrhea. She has had cellulitis in left lower extremity diagnosed 5 days ago and was prescribed doxy/keflex which likely did not help as she was vomiting. She had mild fever up to 100.9 with occasional chills.    Assessment & Plan   Abdominal pain with Nausea, vomiting: Possibly due to doxycycline or gastritis or viral GE, versus uremia - Patient continues to complain of nausea and vomiting, abdominal pain - Outpatient PD fluid from 4/28 not consistent with peritonitis however will repeat cell count and culture - CT abdomen and pelvis showed no hydronephrosis or hydroureter, small perihepatic ascites, otherwise no small bowel obstruction normal appendix and no colitis or diverticulitis. Hepatobiliary system showed no gallstones, lung bases are unremarkable - Patient does not express gastroparesis type of symptoms - Improving today, able to tolerate some diet  Cellulitis left lower extremities - Improving - Continue IV vancomycin and Zosyn  ESRD on peritoneal dialysis - Nephrology consulted, appreciate recommendations  SLE - Continue CellCept, prednisone, Plaquenil  Hypertension -BP stable today  Code Status: FC  Family Communication:  Discussed in detail with the patient, all imaging results, lab results explained to the patient   Disposition Plan: Hopefully DC home when medically ready from nephrology standpoint  Time Spent in minutes  15 minutes  Procedures  PD  Consults   Renal   DVT Prophylaxis  Lovenox *  Medications  Scheduled Meds: . allopurinol  300 mg Oral Daily  . calcitRIOL  0.25 mcg Oral Daily  . dialysis solution 1.5% low-MG/low-CA   Intraperitoneal Q24H  . enoxaparin (LOVENOX) injection  30 mg Subcutaneous Q24H  . gentamicin cream  1 application Topical Daily  . hydrocortisone   Topical BID  . hydroxychloroquine  200 mg Oral BID  . mycophenolate  1,000 mg Oral BID  . pantoprazole  40 mg Oral QHS  . piperacillin-tazobactam (ZOSYN)  IV  2.25 g Intravenous Q12H  . predniSONE  5 mg Oral Q breakfast  . sevelamer carbonate  3,200 mg Oral TID WC   Continuous Infusions:  PRN Meds:.heparin, ondansetron **OR** ondansetron (ZOFRAN) IV, prochlorperazine, valACYclovir   Antibiotics   Anti-infectives    Start     Dose/Rate Route Frequency Ordered Stop   02/22/16 1000  piperacillin-tazobactam (ZOSYN) IVPB 2.25 g  2.25 g 100 mL/hr over 30 Minutes Intravenous Every 12 hours 02/22/16 0836     02/21/16 1000  hydroxychloroquine (PLAQUENIL) tablet 200 mg     200 mg Oral 2 times daily 02/20/16 2308     02/21/16 0800  piperacillin-tazobactam (ZOSYN) IVPB 2.25 g  Status:  Discontinued     2.25 g 100 mL/hr over 30 Minutes Intravenous Every 8 hours 02/21/16 0124 02/22/16 0836   02/21/16 0400  vancomycin (VANCOCIN) IVPB 1000 mg/200 mL premix     1,000 mg 200 mL/hr over 60 Minutes Intravenous  Once 02/21/16 0327 02/21/16 0504   02/21/16 0000  piperacillin-tazobactam (ZOSYN) IVPB 3.375 g     3.375 g 100 mL/hr over 30 Minutes Intravenous  Once 02/20/16 2355 02/21/16 0052   02/21/16 0000  vancomycin (VANCOCIN) IVPB 1000 mg/200 mL premix     1,000 mg 200 mL/hr over 60 Minutes Intravenous  Once 02/20/16 2355  02/21/16 0202   02/20/16 2330  cefTRIAXone (ROCEPHIN) 1 g in dextrose 5 % 50 mL IVPB  Status:  Discontinued     1 g 100 mL/hr over 30 Minutes Intravenous Every 24 hours 02/20/16 2325 02/20/16 2351   02/20/16 2315  doxycycline (VIBRA-TABS) tablet 100 mg  Status:  Discontinued     100 mg Oral 2 times daily 02/20/16 2308 02/20/16 2316   02/20/16 2308  valACYclovir (VALTREX) tablet 1,000 mg     1,000 mg Oral Daily PRN 02/20/16 2308          Subjective:   [name] [name] was seen and examined today. Feeling somewhat better today, nausea but no vomiting. No fevers or chills. No diarrhea. Left leg cellulitis is improving. Patient denies dizziness, chest pain, shortness of breath, new weakness, numbess, tingling. No acute events overnight.    Objective:   Filed Vitals:   02/22/16 1643 02/22/16 2222 02/23/16 0551 02/23/16 0900  BP: 146/93 167/109 151/102 174/103  Pulse: 95 92 95 95  Temp: 98.5 F (36.9 C) 98.3 F (36.8 C) 98.5 F (36.9 C) 98 F (36.7 C)  TempSrc: Oral Oral Oral Oral  Resp: 17 18 18 18   Height:      Weight:  103.2 kg (227 lb 8.2 oz)    SpO2: 100% 97% 96% 96%    Intake/Output Summary (Last 24 hours) at 02/23/16 1209 Last data filed at 02/23/16 0646  Gross per 24 hour  Intake  12600 ml  Output      0 ml  Net  12600 ml     Wt Readings from Last 3 Encounters:  02/22/16 103.2 kg (227 lb 8.2 oz)  02/14/16 104.327 kg (230 lb)  12/02/15 102.785 kg (226 lb 9.6 oz)     Exam  General: Alert and oriented x 3, NAD  HEENT:    Neck:  CVS: S1 S2 auscultated, no rubs, murmurs or gallops. Regular rate and rhythm.  Respiratory: Clear to auscultation bilaterally, no wheezing, rales or rhonchi  Abdomen: Soft, nontender, nondistended, + bowel sounds, PD catheter no tenderness around the site  Ext: no cyanosis clubbing or edema, LLE cellulitis improved  Neuro: no new deficits  Skin: No rashes  Psych: Normal affect and demeanor, alert and oriented x3    Data  Reviewed:  I have personally reviewed following labs and imaging studies  Micro Results Recent Results (from the past 240 hour(s))  Culture, Urine     Status: None   Collection Time: 02/20/16  8:12 PM  Result Value Ref Range Status   Specimen Description  URINE, RANDOM  Final   Special Requests NONE  Final   Culture   Final    NO GROWTH 1 DAY Performed at [facility]    Report Status 02/22/2016 FINAL  Final  Body fluid culture     Status: None (Preliminary result)   Collection Time: 02/21/16  6:30 PM  Result Value Ref Range Status   Specimen Description FLUID PERITONEAL  Final   Special Requests Immunocompromised  Final   Gram Stain   Final    RARE WBC PRESENT,BOTH PMN AND MONONUCLEAR NO ORGANISMS SEEN    Culture NO GROWTH 2 DAYS  Final   Report Status PENDING  Incomplete    Radiology Reports Ct Abdomen Pelvis Wo Contrast  02/22/2016  CLINICAL DATA:  Recurrent nausea and vomiting for the last 6 days, upper abdominal pain last few days EXAM: CT ABDOMEN AND PELVIS WITHOUT CONTRAST TECHNIQUE: Multidetector CT imaging of the abdomen and pelvis was performed following the standard protocol without IV contrast. COMPARISON:  Images of the upper abdomen CT scan 06/28/2010 FINDINGS: Lower chest: Small hiatal hernia is noted. Lung bases are unremarkable. Hepatobiliary: The unenhanced liver shows no biliary ductal dilatation. No calcified gallstones are noted within gallbladder. Small amount of perihepatic ascites. Pancreas: Unenhanced pancreas is unremarkable. Spleen: Unenhanced spleen is unremarkable. Adrenals/Urinary Tract: No adrenal gland mass. Unenhanced kidneys are symmetrical in size. No hydronephrosis or hydroureter. No nephrolithiasis. No calcified ureteral calculi. No calcified calculi are noted within under distended urinary bladder. Stomach/Bowel: There is no gastric outlet obstruction. No small bowel obstruction. No thickened or dilated small bowel loops are noted. There is a  left peritoneal dialysis catheter entering left upper abdominal wall with tip coiled within anterior pelvis. No pericecal inflammation. Normal appendix noted in axial image 66. No colitis or diverticulitis. No distal colonic obstruction. Vascular/Lymphatic: No retroperitoneal or mesenteric adenopathy. No aortic aneurysm. Reproductive: The unenhanced uterus and ovaries are unremarkable. Small pelvic free fluid is noted. Other: There is small pelvic ascites. Small ascites is noted in right lower quadrant. Small perihepatic ascites. Small amount of free air in upper abdomen most likely from peritoneal dialysis. No mesenteric abscess. The there is no inguinal adenopathy. Musculoskeletal: Sagittal images of the spine shows no destructive bony lesions. No destructive bony lesions are noted within pelvis. IMPRESSION: 1. No nephrolithiasis.  No hydronephrosis or hydroureter. 2. A left anterior abdominal peritoneal dialysis catheter is noted. Small amount of free air in upper abdomen most likely from peritoneal dialysis. There is small perihepatic ascites. Small ascites is noted in right lower quadrant anteriorly. Small pelvic ascites. 3. No small bowel obstruction. No pericecal inflammation. Normal appendix. 4. No evidence of colitis or diverticulitis. 5. Unremarkable unenhanced uterus and ovaries. Electronically Signed   By: Lahoma Crocker M.D.   On: 02/22/2016 12:02    CBC  Recent Labs Lab 02/20/16 1540  WBC 6.0  HGB 11.3*  HCT 35.0*  PLT 330  MCV 84.1  MCH 27.2  MCHC 32.3  RDW 15.6*    Chemistries   Recent Labs Lab 02/20/16 1540 02/23/16 0446  NA 139 140  K 3.7 3.3*  CL 95* 99*  CO2 24 23  GLUCOSE 93 96  BUN 63* 52*  CREATININE 19.31* 17.60*  CALCIUM 9.5 9.2  AST 19  --   ALT 17  --   ALKPHOS 68  --   BILITOT 0.5  --    ------------------------------------------------------------------------------------------------------------------ estimated creatinine clearance is 5.5 mL/min (by C-G  formula based on Cr of  17.6). ------------------------------------------------------------------------------------------------------------------ No results for input(s): HGBA1C in the last 72 hours. ------------------------------------------------------------------------------------------------------------------ No results for input(s): CHOL, HDL, LDLCALC, TRIG, CHOLHDL, LDLDIRECT in the last 72 hours. ------------------------------------------------------------------------------------------------------------------ No results for input(s): TSH, T4TOTAL, T3FREE, THYROIDAB in the last 72 hours.  Invalid input(s): FREET3 ------------------------------------------------------------------------------------------------------------------ No results for input(s): VITAMINB12, FOLATE, FERRITIN, TIBC, IRON, RETICCTPCT in the last 72 hours.  Coagulation profile No results for input(s): INR, PROTIME in the last 168 hours.  No results for input(s): DDIMER in the last 72 hours.  Cardiac Enzymes No results for input(s): CKMB, TROPONINI, MYOGLOBIN in the last 168 hours.  Invalid input(s): CK ------------------------------------------------------------------------------------------------------------------ Invalid input(s): POCBNP  No results for input(s): GLUCAP in the last 72 hours.   RAI,RIPUDEEP M.D. [facility] 02/23/2016, 12:09 PM  Pager: [phone] Between 7am to 7pm - call Pager - [phone]  After 7pm go to [email or web address] - password [email or web address]  Call night coverage person covering after 7pm

## 2016-02-24 LAB — GASTROINTESTINAL PANEL BY PCR, STOOL (REPLACES STOOL CULTURE)
ASTROVIRUS: NOT DETECTED
Adenovirus F40/41: NOT DETECTED
CAMPYLOBACTER SPECIES: NOT DETECTED
CRYPTOSPORIDIUM: NOT DETECTED
Cyclospora cayetanensis: NOT DETECTED
E. COLI O157: NOT DETECTED
ENTEROAGGREGATIVE E COLI (EAEC): NOT DETECTED
ENTEROPATHOGENIC E COLI (EPEC): NOT DETECTED
ENTEROTOXIGENIC E COLI (ETEC): NOT DETECTED
Entamoeba histolytica: NOT DETECTED
GIARDIA LAMBLIA: NOT DETECTED
NOROVIRUS GI/GII: NOT DETECTED
Plesimonas shigelloides: NOT DETECTED
Rotavirus A: NOT DETECTED
SHIGELLA/ENTEROINVASIVE E COLI (EIEC): NOT DETECTED
Salmonella species: NOT DETECTED
Sapovirus (I, II, IV, and V): NOT DETECTED
Shiga like toxin producing E coli (STEC): NOT DETECTED
VIBRIO CHOLERAE: NOT DETECTED
Vibrio species: NOT DETECTED
Yersinia enterocolitica: NOT DETECTED

## 2016-02-24 LAB — BASIC METABOLIC PANEL
Anion gap: 19 — ABNORMAL HIGH (ref 5–15)
BUN: 47 mg/dL — AB (ref 6–20)
CALCIUM: 9.3 mg/dL (ref 8.9–10.3)
CO2: 25 mmol/L (ref 22–32)
CREATININE: 16.5 mg/dL — AB (ref 0.44–1.00)
Chloride: 97 mmol/L — ABNORMAL LOW (ref 101–111)
GFR calc Af Amer: 3 mL/min — ABNORMAL LOW (ref >60)
GFR, EST NON AFRICAN AMERICAN: 2 mL/min — AB (ref >60)
Glucose, Bld: 93 mg/dL (ref 65–99)
Potassium: 3.1 mmol/L — ABNORMAL LOW (ref 3.5–5.1)
SODIUM: 141 mmol/L (ref 135–145)

## 2016-02-24 LAB — VANCOMYCIN, RANDOM: VANCOMYCIN RM: 34 ug/mL

## 2016-02-24 MED ORDER — CEPHALEXIN 250 MG PO CAPS: 500.0000 mg | ORAL_CAPSULE | Freq: Every day | ORAL | Status: DC

## 2016-02-24 MED ORDER — LABETALOL HCL 200 MG PO TABS: 200.0000 mg | ORAL_TABLET | Freq: Three times a day (TID) | ORAL | Status: DC

## 2016-02-24 MED ORDER — CEPHALEXIN 500 MG PO CAPS: 500.0000 mg | ORAL_CAPSULE | Freq: Every day | ORAL | Status: DC

## 2016-02-24 MED ORDER — TRAMADOL HCL 50 MG PO TABS: 50.0000 mg | ORAL_TABLET | Freq: Three times a day (TID) | ORAL | Status: DC | PRN

## 2016-02-24 MED ORDER — PROMETHAZINE HCL 12.5 MG PO TABS: 12.5000 mg | ORAL_TABLET | Freq: Four times a day (QID) | ORAL | Status: DC | PRN

## 2016-02-24 MED ORDER — HYDRALAZINE HCL 50 MG PO TABS: 50.0000 mg | ORAL_TABLET | Freq: Three times a day (TID) | ORAL | Status: DC

## 2016-02-24 NOTE — Progress Notes (Addendum)
Pharmacy Antibiotic Note  [name] [name] is a 35 y.o. year-old female admitted on 02/20/2016.  The patient is currently on day 2 of Vancomycin & Zosyn for rule out peritonitis, UTI, lower extremity cellulitis. She has ESRD on CCPD.  She received a vancomycin 2g total load on 5/1 A random vancomycin level drawn with AM labs this morning was 49mcg/mL. She will likely be therapeutic on vancomycin for another 48h  Assessment/Plan: -no need to redose vancomycin at this time -stop Zosyn -start Keflex 500mg  PO qhs on 5/6 x3 doses (vancomycin therapeutic until then) for a total of 7 days of treatment -plan above discussed with Dr. Tana Coast -pharmacy to sign off- no further changes needed. Please re-consult if needed  Temp (24hrs), Avg:98.7 F (37.1 C), Min:98.2 F (36.8 C), Max:99.4 F (37.4 C)   Recent Labs Lab 02/20/16 1540  WBC 6.0     Recent Labs Lab 02/20/16 1540 02/23/16 0446 02/24/16 0507  CREATININE 19.31* 17.60* 16.50*   Estimated Creatinine Clearance: 5.8 mL/min (by C-G formula based on Cr of 16.5).    Allergies  Allergen Reactions  . Lisinopril Swelling  . Nsaids     She should avoid all due to renal insufficiency   Antimicrobials this admission: Vancomycin 5/1>> Zosyn 5/1>>  Levels/dose changes this admission: 5/1 VR = 60mcg/mL - 24h after load 5/4 VR = 82mcg/mL - 72h after load  Microbiology results: 4/30 UCx: neg 4/30 Body fluid: ngtd 5/3 GI panel: sent 5/3 CDiff: neg   Thank you for allowing pharmacy to be a part of this patient's care.  Mattison Stuckey D. Sheera Illingworth, PharmD, BCPS Clinical Pharmacist Pager: [phone] 02/24/2016 10:04 AM

## 2016-02-24 NOTE — Care Management Note (Signed)
Case Management Note  Patient Details  Name: [name] [name] MRN: [id number] Date of Birth: 01-Oct-1981  Subjective/Objective:  ESRD with Uremia improving on peritoneal dialysis, Lupus                   Action/Plan: Discharge Planning: AVS reviewed: NCM spoke to pt. States she can afford her medications at home. Her sister, [name] [name] [phone] will assist if needed.   PCP -[name] Reddish MD Expected Discharge Date: 02/24/2016              Expected Discharge Plan:  Home/Self Care  In-House Referral:  NA  Discharge planning Services  CM Consult  Post Acute Care Choice:  NA Choice offered to:  NA  DME Arranged:  N/A DME Agency:  NA  HH Arranged:  NA HH Agency:  NA  Status of Service:  Completed, signed off  Medicare Important Message Given:    Date Medicare IM Given:    Medicare IM give by:    Date Additional Medicare IM Given:    Additional Medicare Important Message give by:     If discussed at [place] of Stay Meetings, dates discussed:    Additional Comments:  Erenest Rasher, RN 02/24/2016, 12:35 PM

## 2016-02-24 NOTE — Discharge Summary (Addendum)
Physician Discharge Summary   Patient ID: SOLAY STADTMILLER MRN: [id number] DOB/AGE: 37/19/1982 37 y.o.  Admit date: 02/20/2016 Discharge date: 02/24/2016  Primary Care Physician:  Garret Reddish, MD  Discharge Diagnoses:    . Abdominal pain nausea, Vomiting    ESRD WITH Uremia improving on peritoneal dialysis  . Essential hypertension . ERYTHEMATOSUS, LUPUS  Cellulitis left leg   Consults: Nephrology, Dr. Jonnie Finner   Recommendations for Outpatient Follow-up:  1. Patient was admitted to restart Keflex on 5/6 for 4 more days.  2. Please repeat CBC/BMET at next visit 3. Labetalol, hydralazine still on hold until follow-up with Dr. Joelyn Oms   DIET: Renal diet    Allergies:   Allergies  Allergen Reactions  . Lisinopril Swelling  . Nsaids     She should avoid all due to renal insufficiency     DISCHARGE MEDICATIONS: Current Discharge Medication List    START taking these medications   Details  promethazine (PHENERGAN) 12.5 MG tablet Take 1 tablet (12.5 mg total) by mouth every 6 (six) hours as needed for nausea or vomiting. Qty: 30 tablet, Refills: 0    traMADol (ULTRAM) 50 MG tablet Take 1 tablet (50 mg total) by mouth every 8 (eight) hours as needed for moderate pain or severe pain. Qty: 30 tablet, Refills: 0      CONTINUE these medications which have CHANGED   Details  cephALEXin (KEFLEX) 250 MG capsule Take 2 capsules (500 mg total) by mouth at bedtime. Start on 02/26/16 (Saturday) for 4 more days Qty: 28 capsule, Refills: 0    hydrALAZINE (APRESOLINE) 50 MG tablet Take 1 tablet (50 mg total) by mouth 3 (three) times daily. HOLD UNTIL FOLLOW-UP WITH DR Joelyn Oms    labetalol (NORMODYNE) 200 MG tablet Take 1 tablet (200 mg total) by mouth 3 (three) times daily. HOLD until follow-up with Dr Charlestine Night: 90 tablet, Refills: 0      CONTINUE these medications which have NOT CHANGED   Details  allopurinol (ZYLOPRIM) 300 MG tablet TAKE 1 TABLET BY MOUTH DAILY Qty:  90 tablet, Refills: 2    Belimumab (BENLYSTA IV) Inject into the vein every 14 (fourteen) days.     calcitRIOL (ROCALTROL) 0.25 MCG capsule Take 0.25 mcg by mouth daily.     cloNIDine (CATAPRES - DOSED IN MG/24 HR) 0.2 mg/24hr patch Place 1 patch (0.2 mg total) onto the skin once a week. PATIENT NEEDS TO CONTACT OFFICE FOR ADDITIONAL REFILLS Qty: 4 patch, Refills: 0    desonide (DESOWEN) 0.05 % ointment Apply 1 application topically daily.  Refills: 1    diltiazem (TIAZAC) 360 MG 24 hr capsule Take 360 mg by mouth daily.     gentamicin cream (GARAMYCIN) 0.1 % Apply 1 application topically 3 (three) times daily as needed. For cleaning exit site. Refills: 99    hydroxychloroquine (PLAQUENIL) 200 MG tablet TAKE 1 TABLET BY MOUTH TWICE A DAY Qty: 60 tablet, Refills: 1    mycophenolate (CELLCEPT) 500 MG tablet Take 1,000 mg by mouth 2 (two) times daily.     predniSONE (DELTASONE) 10 MG tablet Take 5 mg by mouth daily.  Refills: 12    RENVELA 800 MG tablet Take 1,600-3,200 mg by mouth as directed. 4 tablet by mouth three times daily with meals and 1-2 tablets with snacks. Refills: 11    valACYclovir (VALTREX) 1000 MG tablet Take 1,000 mg by mouth daily as needed (for flares.). Reported on 02/14/2016      STOP taking these medications  doxycycline (VIBRA-TABS) 100 MG tablet      torsemide (DEMADEX) 100 MG tablet          Brief H and P: For complete details please refer to admission H and P, but in brief Patient is a 35 yo female with history of HTN, ESRD on PD, lupus who came with cc of nausea and vomiting for the past 4 days. Vomitus is non bloody. Both were associated mainly with PD and after finishing PD every night associated with mild diffuse abdominal pain w/o dysuria or diarrhea. She has had cellulitis in left lower extremity diagnosed 5 days ago and was prescribed doxy/keflex which likely did not help as she was vomiting. She had mild fever up to 100.9 with occasional  chills.   Hospital Course:  Abdominal pain with Nausea, vomiting: Possibly due to doxycycline or gastritis or viral GE, versus uremia - Significantly improved, tolerating solid diet without any difficulty. Per nephrology, her symptoms could be due to uremia due to under dialysis. - Outpatient PD fluid from 4/28 not consistent with peritonitis, repeat PD culture also negative. - CT abdomen and pelvis showed no hydronephrosis or hydroureter, small perihepatic ascites, otherwise no small bowel obstruction normal appendix and no colitis or diverticulitis. Hepatobiliary system showed no gallstones, lung bases are unremarkable - Patient does not express gastroparesis type of symptoms C. difficile negative, GI pathogen panel in process.  Cellulitis left lower extremities - Improving, patient was placed on IV vancomycin and Zosyn. She will restart Keflex for 4 more doses on 02/26/16   ESRD on peritoneal dialysis - Nephrology was consulted, patient was seen by Dr. Delena Bali, patient was placed back on peritoneal dialysis, creatinine 19.3 on 4/30. Baseline around 5.6. Creatinine has been steadily improving, 16.5 today. Dr. Delena Bali will discuss with Dr. Joelyn Oms regarding further changes in her peritoneal dialysis.   SLE - Continue CellCept, prednisone, Plaquenil  Hypertension -BP stable today, continue to hold labetalol, hydralazine. Patient is cleared from nephrology standpoint to be discharged home.   Day of Discharge BP 144/62 mmHg  Pulse 82  Temp(Src) 98.5 F (36.9 C) (Oral)  Resp 16  Ht 5\' 7"  (1.702 m)  Wt 101.3 kg (223 lb 5.2 oz)  BMI 34.97 kg/m2  SpO2 98%  LMP 01/30/2016 (Approximate)  Physical Exam: General: Alert and awake oriented x3 not in any acute distress. HEENT: anicteric sclera, pupils reactive to light and accommodation CVS: S1-S2 clear no murmur rubs or gallops Chest: clear to auscultation bilaterally, no wheezing rales or rhonchi Abdomen: soft nontender, nondistended,  normal bowel sounds, PD cath Extremities: no cyanosis, clubbing or edema noted bilaterally Neuro: Cranial nerves II-XII intact, no focal neurological deficits   The results of significant diagnostics from this hospitalization (including imaging, microbiology, ancillary and laboratory) are listed below for reference.    LAB RESULTS: Basic Metabolic Panel:  Recent Labs Lab 02/23/16 0446 02/24/16 0507  NA 140 141  K 3.3* 3.1*  CL 99* 97*  CO2 23 25  GLUCOSE 96 93  BUN 52* 47*  CREATININE 17.60* 16.50*  CALCIUM 9.2 9.3   Liver Function Tests:  Recent Labs Lab 02/20/16 1540  AST 19  ALT 17  ALKPHOS 68  BILITOT 0.5  PROT 7.2  ALBUMIN 3.6   No results for input(s): LIPASE, AMYLASE in the last 168 hours. No results for input(s): AMMONIA in the last 168 hours. CBC:  Recent Labs Lab 02/20/16 1540  WBC 6.0  HGB 11.3*  HCT 35.0*  MCV 84.1  PLT 330  Cardiac Enzymes: No results for input(s): CKTOTAL, CKMB, CKMBINDEX, TROPONINI in the last 168 hours. BNP: Invalid input(s): POCBNP CBG: No results for input(s): GLUCAP in the last 168 hours.  Significant Diagnostic Studies:  No results found.  2D ECHO:   Disposition and Follow-up: Discharge Instructions    Discharge instructions    Complete by:  As directed   Please hold labetalol and hydralazine until you follow up with Dr. Joelyn Oms.     Increase activity slowly    Complete by:  As directed             DISPOSITION:home    DISCHARGE FOLLOW-UP Follow-up Information    Follow up with Rexene Agent, MD. Schedule an appointment as soon as possible for a visit in 1 week.   Specialty:  Nephrology   Why:  for hospital follow-up   Contact information:   [place] [address] [phone]       Follow up with Garret Reddish, MD. Schedule an appointment as soon as possible for a visit in 10 days.   Specialty:  Family Medicine   Why:  for hospital follow-up   Contact information:   [address] [place] [address] [phone]        Time spent on Discharge: 55mins   Signed:   RAI,RIPUDEEP M.D. [facility] 02/24/2016, 12:36 PM Pager: [phone]

## 2016-02-24 NOTE — Progress Notes (Signed)
Discharge instructions and medications discussed with patient.  Prescriptions given to patient.  All questions answered.  

## 2016-02-25 LAB — BODY FLUID CULTURE: CULTURE: NO GROWTH

## 2016-03-03 ENCOUNTER — Encounter: Payer: Self-pay | Admitting: Family Medicine

## 2016-03-06 MED ORDER — DOXYCYCLINE HYCLATE 100 MG PO TABS: 100.0000 mg | ORAL_TABLET | Freq: Two times a day (BID) | ORAL | Status: DC

## 2016-03-09 ENCOUNTER — Encounter: Payer: Self-pay | Admitting: Family Medicine

## 2016-03-09 ENCOUNTER — Ambulatory Visit (INDEPENDENT_AMBULATORY_CARE_PROVIDER_SITE_OTHER): Payer: BLUE CROSS/BLUE SHIELD | Admitting: Family Medicine

## 2016-03-09 VITALS — BP 110/84 | HR 107 | Temp 98.4°F | Ht 67.0 in | Wt 218.0 lb

## 2016-03-09 DIAGNOSIS — L03119 Cellulitis of unspecified part of limb: Secondary | ICD-10-CM

## 2016-03-09 DIAGNOSIS — Z992 Dependence on renal dialysis: Secondary | ICD-10-CM

## 2016-03-09 DIAGNOSIS — I10 Essential (primary) hypertension: Secondary | ICD-10-CM | POA: Diagnosis not present

## 2016-03-09 DIAGNOSIS — N186 End stage renal disease: Secondary | ICD-10-CM

## 2016-03-09 LAB — CBC WITH DIFFERENTIAL/PLATELET
BASOS PCT: 0.2 % (ref 0.0–3.0)
Basophils Absolute: 0 10*3/uL (ref 0.0–0.1)
EOS PCT: 0.3 % (ref 0.0–5.0)
Eosinophils Absolute: 0 10*3/uL (ref 0.0–0.7)
HEMATOCRIT: 36.8 % (ref 36.0–46.0)
HEMOGLOBIN: 11.8 g/dL — AB (ref 12.0–15.0)
LYMPHS PCT: 11.7 % — AB (ref 12.0–46.0)
Lymphs Abs: 0.9 10*3/uL (ref 0.7–4.0)
MCHC: 32.1 g/dL (ref 30.0–36.0)
MCV: 86.8 fl (ref 78.0–100.0)
MONO ABS: 1 10*3/uL (ref 0.1–1.0)
MONOS PCT: 13.9 % — AB (ref 3.0–12.0)
Neutro Abs: 5.5 10*3/uL (ref 1.4–7.7)
Neutrophils Relative %: 73.9 % (ref 43.0–77.0)
Platelets: 262 10*3/uL (ref 150.0–400.0)
RBC: 4.24 Mil/uL (ref 3.87–5.11)
RDW: 17.1 % — AB (ref 11.5–15.5)
WBC: 7.5 10*3/uL (ref 4.0–10.5)

## 2016-03-09 LAB — BASIC METABOLIC PANEL
BUN: 69 mg/dL — AB (ref 6–23)
CALCIUM: 10.1 mg/dL (ref 8.4–10.5)
CO2: 26 meq/L (ref 19–32)
CREATININE: 20.06 mg/dL — AB (ref 0.40–1.20)
Chloride: 91 mEq/L — ABNORMAL LOW (ref 96–112)
GFR: 2.53 mL/min — AB (ref >60.00)
GLUCOSE: 81 mg/dL (ref 70–99)
Potassium: 4.5 mEq/L (ref 3.5–5.1)
Sodium: 137 mEq/L (ref 135–145)

## 2016-03-09 NOTE — Assessment & Plan Note (Addendum)
S:Patient states if she takes any BP meds, her SBP ends up around 90 and she feels very poorly. She is only taking torsidemide. She is off  clonidine patch, diltiazem 360mg , hydralazine 50mg  3x a day, clonidine 0.2mg  /24 hr patch A/P: We will continue torsemide and restart labetalol 200mg  BID. She follows BP at home and will reach out with updated #s next week- we verbally discussed this but did not write in AVS. I discontinued other meds on list- she is also to reach out to renal Dr. Joelyn Oms about her issues with lower blood pressures.

## 2016-03-09 NOTE — Progress Notes (Addendum)
Subjective:  [name] [name] is a 35 y.o. year old very pleasant female patient who presents for hospital follow up.  See problem oriented charting ROS- no fever, chills, nausea or vomiting. Does have slightly elevated HR but no palpitations. Does not feel fatigued as has in past. .see any ROS included in HPI as well.   Past Medical History-  Patient Active Problem List   Diagnosis Date Noted  . ESRD on dialysis [facility]) 08/05/2015    Priority: High  . Essential hypertension 09/22/2010    Priority: High  . Secondary cardiomyopathy ([place]) 07/14/2009    Priority: High  . LUPUS NEPHRITIS 07/14/2009    Priority: High  . ERYTHEMATOSUS, LUPUS 08/07/2006    Priority: High  . Recurrent cellulitis of lower leg 03/09/2016    Priority: Medium  . Gout 08/14/2012    Priority: Medium  . Pulmonary hypertension, secondary ([place]) 07/28/2010    Priority: Medium  . History of TIA (transient ischemic attack) 07/14/2009    Priority: Medium  . Onychomycosis 12/02/2014    Priority: Low  . Anemia 08/07/2006    Priority: Low  . Vomiting 02/20/2016  . Diarrhea 05/03/2015  . Enteritis due to Clostridium difficile 05/03/2015    Medications- reviewed and updated Current Outpatient Prescriptions  Medication Sig Dispense Refill  . allopurinol (ZYLOPRIM) 300 MG tablet TAKE 1 TABLET BY MOUTH DAILY 90 tablet 2  . calcitRIOL (ROCALTROL) 0.5 MCG capsule Take 0.5 mcg by mouth daily as needed.  3  . desonide (DESOWEN) 0.05 % ointment Apply 1 application topically daily.   1  . doxycycline (VIBRA-TABS) 100 MG tablet Take 1 tablet (100 mg total) by mouth 2 (two) times daily. 14 tablet 0  . gentamicin cream (GARAMYCIN) 0.1 % Apply 1 application topically 3 (three) times daily as needed. For cleaning exit site.  99  . hydroxychloroquine (PLAQUENIL) 200 MG tablet TAKE 1 TABLET BY MOUTH TWICE A DAY 60 tablet 1  . labetalol (NORMODYNE) 200 MG tablet Take 1 tablet (200 mg total) by mouth 3 (three) times daily. HOLD  until follow-up with Dr Joelyn Oms 90 tablet 0  . mycophenolate (CELLCEPT) 500 MG tablet Take 1,000 mg by mouth 2 (two) times daily.     . predniSONE (DELTASONE) 10 MG tablet Take 5 mg by mouth daily.   12  . RENVELA 800 MG tablet Take 1,600-3,200 mg by mouth as directed. 4 tablet by mouth three times daily with meals and 1-2 tablets with snacks.  11  . torsemide (DEMADEX) 100 MG tablet Take 100 mg by mouth every morning.  11  . triamcinolone lotion (KENALOG) 0.1 %     . valACYclovir (VALTREX) 1000 MG tablet Take 1,000 mg by mouth daily as needed (for flares.). Reported on 02/14/2016     No current facility-administered medications for this visit.    Objective: BP 110/84 mmHg  Pulse 107  Temp(Src) 98.4 F (36.9 C) (Oral)  Ht 5\' 7"  (1.702 m)  Wt 218 lb (98.884 kg)  BMI 34.14 kg/m2  SpO2 97%  LMP 02/25/2016 Gen: NAD, resting comfortably, in work clothes ready to go to work afte rvisit CV: RRR no murmurs rubs or gallops Lungs: CTAB no crackles, wheeze, rhonchi Abdomen: soft/nontender/nondistended/normal bowel sounds. No rebound or guarding.  Ext/skin: trace edema bilaterally. On back of right leg very mild erythema much improved from prior picture. On left lower leg has a few patches of erythema, warmth that is slightly tender.  Neuro: grossly normal, moves all extremities  Assessment/Plan:  Essential hypertension S:Patient states if she takes any BP meds, her SBP ends up around 90 and she feels very poorly. She is only taking torsidemide. She is off  clonidine patch, diltiazem 360mg , hydralazine 50mg  3x a day, clonidine 0.2mg  /24 hr patch A/P: We will continue torsemide and restart labetalol 200mg  BID. She follows BP at home and will reach out with updated #s next week- we verbally discussed this but did not write in AVS. I discontinued other meds on list- she is also to reach out to renal Dr. Joelyn Oms about her issues with lower blood pressures.   Recurrent cellulitis of lower leg ESRD  leading to potential uremia Nausea/vomiting S: I saw patient about 2 weeks ago and diagnosed with cellulitis of left leg. She was placed on doxycycline and keflex. She states her cellulitis was improving but she developed nausea and vomiting. She went to the hospital and was admitted- unclear if cause was inadequately treated cellulitis or potential anemia- her Creatinine increased to the 20s when previously around 6 during that time and trended down slightly before discharge. She was on vanc/zosyn in hospital. She had 4 more days of keflex after leaving the hospital and states she had complete resolution of the prior cellulitis.   In hospital she had CT abdomen and pelvis which was largely unremarkable- except small perihepatic ascites. She had PD fluid analyazed and not consistent with peritonitis. Symptoms may have been due to under dialysis and concern could recur as unclear if significant adjustments have been made at home for PD.   This is now her 3rd case of cellulitis this year. 1 episode earlier responded completely to doxycycline. Hospital case ultimately needed vanc/zosyn but later cleared on keflex after this initiation. Since leaving the hospital left leg cellulitis cleared but then later had recurrence on the right leg (see mychart picture from several days ago). She was placed on doxycycline through mychart and she has had improvement in right leg (also states left cellulitis recurred- but this is much mor eslow to improve).  A/P:  With 3rd case of cellulitis this year, refer to ID for ideas in prevention (advised elevation and OTC compression stockings) as well as treatment given limited options on peritoneal dialysis (primarily we have decided on keflex and doxy outpatient and otherwise would need hospitalization). Patient really enjoys being at home and being able to work and inquires about home treatment- but given potential need for different access in future for hemodialysis- do not think  PICC and outpatient treatment would be appropriate. Currently- cellulitis is improving and hopeful it will continue- discussed adding keflex if does not.    Also Creatinine back up above 20- called patient and instructed to call nephrology ASAP for any adjustments.   Return precautions advised.   Orders Placed This Encounter  Procedures  . CBC with Differential/Platelet  . Basic metabolic panel    [place]  . Ambulatory referral to Infectious Disease    Referral Priority:  Routine    Referral Type:  Consultation    Referral Reason:  Specialty Services Required    Requested Specialty:  Infectious Diseases    Number of Visits Requested:  1    Meds ordered this encounter  Medications  . calcitRIOL (ROCALTROL) 0.5 MCG capsule    Sig: Take 0.5 mcg by mouth daily as needed.    Refill:  3  . torsemide (DEMADEX) 100 MG tablet    Sig: Take 100 mg by mouth every morning.    Refill:  11  . triamcinolone lotion (KENALOG) 0.1 %    Sig:    The duration of face-to-face time during this visit was 25 minutes. Greater than 50% of this time was spent in counseling, explanation of diagnosis, planning of further management, and/or coordination of care.     Garret Reddish, MD

## 2016-03-09 NOTE — Patient Instructions (Addendum)
Labs before you leave  Glad the cellulitis is improving but slower than I would like. If does not continue to improve- I can add the keflex back in but if that doesn't work likely need to go to the hospital.   Given this is the 3rd case this year, refer to ID to see if they have any ideas on outpatient treatment options if it occurs or in preventative precautions (keeping fluid out of leg may help- elevating leg, compression stockings perhaps)  Sign release of information at the check out desk for records for your pap smear and last HIV test from GYN

## 2016-03-09 NOTE — Assessment & Plan Note (Addendum)
ESRD leading to potential uremia Nausea/vomiting S: I saw patient about 2 weeks ago and diagnosed with cellulitis of left leg. She was placed on doxycycline and keflex. She states her cellulitis was improving but she developed nausea and vomiting. She went to the hospital and was admitted- unclear if cause was inadequately treated cellulitis or potential anemia- her Creatinine increased to the 20s when previously around 6 during that time and trended down slightly before discharge. She was on vanc/zosyn in hospital. She had 4 more days of keflex after leaving the hospital and states she had complete resolution of the prior cellulitis.   In hospital she had CT abdomen and pelvis which was largely unremarkable- except small perihepatic ascites. She had PD fluid analyazed and not consistent with peritonitis. Symptoms may have been due to under dialysis and concern could recur as unclear if significant adjustments have been made at home for PD.   This is now her 3rd case of cellulitis this year. 1 episode earlier responded completely to doxycycline. Hospital case ultimately needed vanc/zosyn but later cleared on keflex after this initiation. Since leaving the hospital left leg cellulitis cleared but then later had recurrence on the right leg (see mychart picture from several days ago). She was placed on doxycycline through mychart and she has had improvement in right leg (also states left cellulitis recurred- but this is much mor eslow to improve).  A/P:  With 3rd case of cellulitis this year, refer to ID for ideas in prevention (advised elevation and OTC compression stockings) as well as treatment given limited options on peritoneal dialysis (primarily we have decided on keflex and doxy outpatient and otherwise would need hospitalization). Patient really enjoys being at home and being able to work and inquires about home treatment- but given potential need for different access in future for hemodialysis- do not  think PICC and outpatient treatment would be appropriate. Currently- cellulitis is improving and hopeful it will continue- discussed adding keflex if does not.

## 2016-03-13 ENCOUNTER — Encounter: Payer: Self-pay | Admitting: Family Medicine

## 2016-03-17 ENCOUNTER — Inpatient Hospital Stay (HOSPITAL_COMMUNITY)
Admission: EM | Admit: 2016-03-17 | Discharge: 2016-03-21 | DRG: 683 | Disposition: A | Discharge status: Home / Self Care | Payer: BLUE CROSS/BLUE SHIELD | Attending: Internal Medicine | Admitting: Internal Medicine

## 2016-03-17 ENCOUNTER — Encounter (HOSPITAL_COMMUNITY): Payer: Self-pay

## 2016-03-17 ENCOUNTER — Observation Stay (HOSPITAL_BASED_OUTPATIENT_CLINIC_OR_DEPARTMENT_OTHER): Payer: BLUE CROSS/BLUE SHIELD

## 2016-03-17 DIAGNOSIS — N186 End stage renal disease: Secondary | ICD-10-CM | POA: Diagnosis not present

## 2016-03-17 DIAGNOSIS — I429 Cardiomyopathy, unspecified: Secondary | ICD-10-CM | POA: Diagnosis present

## 2016-03-17 DIAGNOSIS — K5900 Constipation, unspecified: Secondary | ICD-10-CM | POA: Diagnosis present

## 2016-03-17 DIAGNOSIS — Y92009 Unspecified place in unspecified non-institutional (private) residence as the place of occurrence of the external cause: Secondary | ICD-10-CM

## 2016-03-17 DIAGNOSIS — I10 Essential (primary) hypertension: Secondary | ICD-10-CM | POA: Diagnosis present

## 2016-03-17 DIAGNOSIS — Z8619 Personal history of other infectious and parasitic diseases: Secondary | ICD-10-CM

## 2016-03-17 DIAGNOSIS — M3214 Glomerular disease in systemic lupus erythematosus: Secondary | ICD-10-CM | POA: Diagnosis present

## 2016-03-17 DIAGNOSIS — M329 Systemic lupus erythematosus, unspecified: Secondary | ICD-10-CM | POA: Diagnosis present

## 2016-03-17 DIAGNOSIS — I272 Other secondary pulmonary hypertension: Secondary | ICD-10-CM | POA: Diagnosis present

## 2016-03-17 DIAGNOSIS — I151 Hypertension secondary to other renal disorders: Secondary | ICD-10-CM | POA: Diagnosis present

## 2016-03-17 DIAGNOSIS — K2960 Other gastritis without bleeding: Secondary | ICD-10-CM | POA: Diagnosis present

## 2016-03-17 DIAGNOSIS — D631 Anemia in chronic kidney disease: Secondary | ICD-10-CM | POA: Diagnosis present

## 2016-03-17 DIAGNOSIS — M109 Gout, unspecified: Secondary | ICD-10-CM | POA: Diagnosis present

## 2016-03-17 DIAGNOSIS — L930 Discoid lupus erythematosus: Secondary | ICD-10-CM | POA: Diagnosis present

## 2016-03-17 DIAGNOSIS — Z992 Dependence on renal dialysis: Secondary | ICD-10-CM

## 2016-03-17 DIAGNOSIS — Z79899 Other long term (current) drug therapy: Secondary | ICD-10-CM

## 2016-03-17 DIAGNOSIS — R112 Nausea with vomiting, unspecified: Secondary | ICD-10-CM

## 2016-03-17 DIAGNOSIS — I425 Other restrictive cardiomyopathy: Secondary | ICD-10-CM | POA: Diagnosis not present

## 2016-03-17 DIAGNOSIS — N2581 Secondary hyperparathyroidism of renal origin: Secondary | ICD-10-CM | POA: Diagnosis present

## 2016-03-17 DIAGNOSIS — L03119 Cellulitis of unspecified part of limb: Secondary | ICD-10-CM | POA: Diagnosis present

## 2016-03-17 DIAGNOSIS — T3695XA Adverse effect of unspecified systemic antibiotic, initial encounter: Secondary | ICD-10-CM | POA: Diagnosis present

## 2016-03-17 DIAGNOSIS — R109 Unspecified abdominal pain: Secondary | ICD-10-CM

## 2016-03-17 DIAGNOSIS — Z8673 Personal history of transient ischemic attack (TIA), and cerebral infarction without residual deficits: Secondary | ICD-10-CM

## 2016-03-17 LAB — COMPREHENSIVE METABOLIC PANEL
ALT: 28 U/L (ref 14–54)
AST: 31 U/L (ref 15–41)
Albumin: 3.6 g/dL (ref 3.5–5.0)
Alkaline Phosphatase: 90 U/L (ref 38–126)
Anion gap: 24 — ABNORMAL HIGH (ref 5–15)
BILIRUBIN TOTAL: 0.6 mg/dL (ref 0.3–1.2)
BUN: 71 mg/dL — ABNORMAL HIGH (ref 6–20)
CHLORIDE: 92 mmol/L — AB (ref 101–111)
CO2: 25 mmol/L (ref 22–32)
CREATININE: 22.36 mg/dL — AB (ref 0.44–1.00)
Calcium: 10.6 mg/dL — ABNORMAL HIGH (ref 8.9–10.3)
GFR, EST AFRICAN AMERICAN: 2 mL/min — AB (ref >60)
GFR, EST NON AFRICAN AMERICAN: 2 mL/min — AB (ref >60)
Glucose, Bld: 108 mg/dL — ABNORMAL HIGH (ref 65–99)
POTASSIUM: 4.5 mmol/L (ref 3.5–5.1)
Sodium: 141 mmol/L (ref 135–145)
TOTAL PROTEIN: 7.7 g/dL (ref 6.5–8.1)

## 2016-03-17 LAB — BODY FLUID CELL COUNT WITH DIFFERENTIAL
Lymphs, Fluid: 84 %
Neutrophil Count, Fluid: 16 % (ref 0–25)
Total Nucleated Cell Count, Fluid: 5 cu mm (ref 0–1000)

## 2016-03-17 LAB — CBC
HEMATOCRIT: 40 % (ref 36.0–46.0)
Hemoglobin: 12.7 g/dL (ref 12.0–15.0)
MCH: 27.3 pg (ref 26.0–34.0)
MCHC: 31.8 g/dL (ref 30.0–36.0)
MCV: 86 fL (ref 78.0–100.0)
PLATELETS: 349 10*3/uL (ref 150–400)
RBC: 4.65 MIL/uL (ref 3.87–5.11)
RDW: 15.3 % (ref 11.5–15.5)
WBC: 6.7 10*3/uL (ref 4.0–10.5)

## 2016-03-17 LAB — GRAM STAIN

## 2016-03-17 LAB — ALBUMIN, FLUID (OTHER): Albumin, Fluid: <1 g/dL

## 2016-03-17 LAB — ECHOCARDIOGRAM COMPLETE

## 2016-03-17 LAB — PROTEIN, BODY FLUID

## 2016-03-17 LAB — GLUCOSE, PERITONEAL FLUID: GLUCOSE, PERITONEAL FLUID: 839 mg/dL

## 2016-03-17 LAB — LIPASE, BLOOD: LIPASE: 48 U/L (ref 11–51)

## 2016-03-17 LAB — LACTATE DEHYDROGENASE, PLEURAL OR PERITONEAL FLUID: LD, Fluid: <5 U/L (ref 3–23)

## 2016-03-17 MED ORDER — HEPARIN 1000 UNIT/ML FOR PERITONEAL DIALYSIS
1500.0000 [IU] | INTRAMUSCULAR | Status: DC | PRN
  Filled 2016-03-17: qty 1.5

## 2016-03-17 MED ORDER — ONDANSETRON HCL 4 MG/2ML IJ SOLN
4.0000 mg | Freq: Four times a day (QID) | INTRAMUSCULAR | Status: DC | PRN
  Administered 2016-03-17 – 2016-03-21 (×5): 4 mg via INTRAVENOUS
  Filled 2016-03-17 (×6): qty 2

## 2016-03-17 MED ORDER — ONDANSETRON HCL 4 MG/2ML IJ SOLN
4.0000 mg | Freq: Once | INTRAMUSCULAR | Status: AC
  Administered 2016-03-17: 4 mg via INTRAVENOUS
  Filled 2016-03-17: qty 2

## 2016-03-17 MED ORDER — MORPHINE SULFATE (PF) 4 MG/ML IV SOLN
4.0000 mg | Freq: Once | INTRAVENOUS | Status: AC
  Administered 2016-03-17: 4 mg via INTRAVENOUS
  Filled 2016-03-17: qty 1

## 2016-03-17 MED ORDER — DELFLEX-LC/1.5% DEXTROSE 346 MOSM/L IP SOLN
INTRAPERITONEAL | Status: DC
  Administered 2016-03-18: 5000 mL via INTRAPERITONEAL

## 2016-03-17 MED ORDER — ONDANSETRON HCL 4 MG PO TABS
4.0000 mg | ORAL_TABLET | Freq: Four times a day (QID) | ORAL | Status: DC | PRN
  Administered 2016-03-19: 4 mg via ORAL
  Filled 2016-03-17: qty 1

## 2016-03-17 MED ORDER — CALCITRIOL 0.5 MCG PO CAPS
0.5000 ug | ORAL_CAPSULE | Freq: Every day | ORAL | Status: DC
  Administered 2016-03-17: 0.5 ug via ORAL
  Filled 2016-03-17: qty 1

## 2016-03-17 MED ORDER — RENA-VITE PO TABS
1.0000 | ORAL_TABLET | Freq: Every day | ORAL | Status: DC
  Administered 2016-03-17 – 2016-03-20 (×4): 1 via ORAL
  Filled 2016-03-17 (×4): qty 1

## 2016-03-17 MED ORDER — ACETAMINOPHEN 650 MG RE SUPP: 650.0000 mg | Freq: Four times a day (QID) | RECTAL | Status: DC | PRN

## 2016-03-17 MED ORDER — PROMETHAZINE HCL 25 MG PO TABS
25.0000 mg | ORAL_TABLET | Freq: Four times a day (QID) | ORAL | Status: DC | PRN
  Administered 2016-03-18: 25 mg via ORAL
  Filled 2016-03-17: qty 1

## 2016-03-17 MED ORDER — MYCOPHENOLATE MOFETIL 250 MG PO CAPS
1000.0000 mg | ORAL_CAPSULE | Freq: Two times a day (BID) | ORAL | Status: DC
Start: 2016-03-17 — End: 2016-03-21
  Administered 2016-03-17 – 2016-03-21 (×8): 1000 mg via ORAL
  Filled 2016-03-17 (×8): qty 4

## 2016-03-17 MED ORDER — GENTAMICIN SULFATE 0.1 % EX CREA
1.0000 "application " | TOPICAL_CREAM | Freq: Every day | CUTANEOUS | Status: DC
  Administered 2016-03-17: 1 via TOPICAL
  Filled 2016-03-17: qty 15

## 2016-03-17 MED ORDER — SEVELAMER CARBONATE 800 MG PO TABS
3200.0000 mg | ORAL_TABLET | Freq: Three times a day (TID) | ORAL | Status: DC
  Administered 2016-03-17 (×2): 3200 mg via ORAL
  Administered 2016-03-18: 1600 mg via ORAL
  Administered 2016-03-20 – 2016-03-21 (×2): 3200 mg via ORAL
  Filled 2016-03-17 (×7): qty 4

## 2016-03-17 MED ORDER — HYDROXYCHLOROQUINE SULFATE 200 MG PO TABS
200.0000 mg | ORAL_TABLET | Freq: Two times a day (BID) | ORAL | Status: DC
  Administered 2016-03-17 – 2016-03-21 (×8): 200 mg via ORAL
  Filled 2016-03-17 (×8): qty 1

## 2016-03-17 MED ORDER — DELFLEX-LC/1.5% DEXTROSE 346 MOSM/L IP SOLN
INTRAPERITONEAL | Status: DC
  Administered 2016-03-17: 3000 mL via INTRAPERITONEAL
  Administered 2016-03-17: 10000 mL via INTRAPERITONEAL

## 2016-03-17 MED ORDER — HEPARIN 1000 UNIT/ML FOR PERITONEAL DIALYSIS
2500.0000 [IU] | INTRAMUSCULAR | Status: DC | PRN
  Filled 2016-03-17: qty 2.5

## 2016-03-17 MED ORDER — HEPARIN SODIUM (PORCINE) 5000 UNIT/ML IJ SOLN
5000.0000 [IU] | Freq: Three times a day (TID) | INTRAMUSCULAR | Status: DC
  Administered 2016-03-17 – 2016-03-18 (×6): 5000 [IU] via SUBCUTANEOUS
  Filled 2016-03-17 (×5): qty 1

## 2016-03-17 MED ORDER — ACETAMINOPHEN 325 MG PO TABS
650.0000 mg | ORAL_TABLET | Freq: Four times a day (QID) | ORAL | Status: DC | PRN
  Administered 2016-03-20: 650 mg via ORAL
  Filled 2016-03-17: qty 2

## 2016-03-17 MED ORDER — GENTAMICIN SULFATE 0.1 % EX CREA
1.0000 "application " | TOPICAL_CREAM | Freq: Every day | CUTANEOUS | Status: DC
  Administered 2016-03-18 – 2016-03-20 (×3): 1 via TOPICAL
  Filled 2016-03-17: qty 15

## 2016-03-17 MED ORDER — SEVELAMER CARBONATE 800 MG PO TABS
800.0000 mg | ORAL_TABLET | ORAL | Status: DC
  Administered 2016-03-17: 1600 mg via ORAL
  Administered 2016-03-20: 800 mg via ORAL
  Filled 2016-03-17: qty 2

## 2016-03-17 MED ORDER — HYDROCORTISONE 1 % EX CREA
TOPICAL_CREAM | Freq: Three times a day (TID) | CUTANEOUS | Status: DC
  Administered 2016-03-17 – 2016-03-21 (×9): via TOPICAL
  Filled 2016-03-17 (×2): qty 28

## 2016-03-17 MED ORDER — ONDANSETRON HCL 4 MG/2ML IJ SOLN
4.0000 mg | Freq: Once | INTRAMUSCULAR | Status: AC | PRN
  Administered 2016-03-17: 4 mg via INTRAVENOUS
  Filled 2016-03-17: qty 2

## 2016-03-17 MED ORDER — PREDNISONE 5 MG PO TABS
5.0000 mg | ORAL_TABLET | Freq: Every day | ORAL | Status: DC
  Administered 2016-03-17 – 2016-03-21 (×5): 5 mg via ORAL
  Filled 2016-03-17 (×5): qty 1

## 2016-03-17 MED ORDER — ALLOPURINOL 300 MG PO TABS
300.0000 mg | ORAL_TABLET | Freq: Every day | ORAL | Status: DC
  Administered 2016-03-17 – 2016-03-21 (×5): 300 mg via ORAL
  Filled 2016-03-17 (×5): qty 1

## 2016-03-17 NOTE — Consult Note (Signed)
[facility] Renal Consultation Note    Indication for Consultation:  Management of ESRD/hemodialysis; anemia, hypertension/volume and secondary hyperparathyroidism PCP: Garret Reddish, MD  HPI: [name] [name] is a 35 y.o. female with ESRD seocndary to SLE on PD since 2016, hx  HTN admitted with nausea and vomiting and elevated Cr and to a lesser extend BUN.  She had a similar presentation in April with elevated Cr of 19 down to 16.5 at discharge . PD fluid was neg. Dwells were increased after that admission to 5 per night- but did not actually change until 5/24.  She is afebrile WBC normal, Cr 22 and BUN 71 today K 4.5. BUN has ranged 104/78 - 138/104. There are no weights or PD fluid cell counts yet.  Review of PD compliance records pretty good. Only missed one day. The Rx to increase dwells from 4-5 per night just started 5/24. Pain is similar to last admission.  Initially she only had pain which started part way through fills, now she has nausea and it became so bad last night she came to the ED partway through her CCPD treatment.  She works as an Field seismologist.  She has no SOB, cough, fever or chills.  She has no abdominal pain when fluid is not in her belly. She makes little urine; doesn't think the torsemide helps much. Her legs swell when she has been on her feet all day. Says nausea got better after her admission earlier in the month but then returned- she seems mostly compliant with her PD  Past Medical History  Diagnosis Date  . Unspecified essential hypertension   . Unspecified deficiency anemia   . Systemic lupus erythematosus ([place])   . Secondary cardiomyopathy, unspecified   . ERYTHEMATOSUS, LUPUS 08/07/2006  . CHF (congestive heart failure) ([place]) 5-6 yrs ago  hosp  . Chronic kidney disease   . Intestinal infection due to Clostridium difficile    Past Surgical History  Procedure Laterality Date  . Kidney biopsies      to determine lupus nephritis    Family History  Problem Relation Age of Onset  . Lupus Mother   . Cancer Maternal Grandmother     unknown  . Prostate cancer Paternal Grandfather   . Diabetes Brother   . Diabetes      mat cousin   Social History:  reports that she has never smoked. She has never used smokeless tobacco. She reports that she drinks alcohol. She reports that she does not use illicit drugs. Allergies  Allergen Reactions  . Lisinopril Swelling  . Nsaids     She should avoid all due to renal insufficiency   Prior to Admission medications   Medication Sig Start Date End Date Taking? Authorizing Provider  allopurinol (ZYLOPRIM) 300 MG tablet TAKE 1 TABLET BY MOUTH DAILY 06/07/15  Yes Marin Olp, MD  calcitRIOL (ROCALTROL) 0.5 MCG capsule Take 0.5 mcg by mouth daily as needed. 02/25/16  Yes Historical Provider, MD  desonide (DESOWEN) 0.05 % ointment Apply 1 application topically daily.  09/15/14  Yes Historical Provider, MD  doxycycline (VIBRA-TABS) 100 MG tablet Take 1 tablet (100 mg total) by mouth 2 (two) times daily. 03/06/16  Yes Marin Olp, MD  gentamicin cream (GARAMYCIN) 0.1 % Apply 1 application topically 3 (three) times daily as needed. For cleaning exit site. 12/28/15  Yes Historical Provider, MD  hydroxychloroquine (PLAQUENIL) 200 MG tablet TAKE 1 TABLET BY MOUTH TWICE A DAY 10/26/14  Yes Bruce  Kendall Flack, MD  labetalol (NORMODYNE) 200 MG tablet Take 1 tablet (200 mg total) by mouth 3 (three) times daily. HOLD until follow-up with Dr Joelyn Oms Patient taking differently: Take 200 mg by mouth 2 (two) times daily. HOLD until follow-up with Dr Jolene Provost 02/24/16  Yes Ripudeep Krystal Eaton, MD  mycophenolate (CELLCEPT) 500 MG tablet Take 1,000 mg by mouth 2 (two) times daily.    Yes Historical Provider, MD  predniSONE (DELTASONE) 10 MG tablet Take 5 mg by mouth daily.  01/26/16  Yes Historical Provider, MD  RENVELA 800 MG tablet Take 1,600-3,200 mg by mouth as directed. 4 tablet by mouth three times daily with  meals and 1-2 tablets with snacks. 01/26/16  Yes Historical Provider, MD  torsemide (DEMADEX) 100 MG tablet Take 100 mg by mouth every morning. 02/25/16  Yes Historical Provider, MD  triamcinolone lotion (KENALOG) 0.1 % Apply 1 application topically 2 (two) times daily as needed (dermatitis).  03/06/16  Yes Historical Provider, MD  valACYclovir (VALTREX) 1000 MG tablet Take 1,000 mg by mouth daily as needed (for flares.). Reported on 02/14/2016 10/29/13  Yes Historical Provider, MD   Current Facility-Administered Medications  Medication Dose Route Frequency Provider Last Rate Last Dose  . acetaminophen (TYLENOL) tablet 650 mg  650 mg Oral Q6H PRN Samella Parr, NP       Or  . acetaminophen (TYLENOL) suppository 650 mg  650 mg Rectal Q6H PRN Samella Parr, NP      . allopurinol (ZYLOPRIM) tablet 300 mg  300 mg Oral Daily Samella Parr, NP   300 mg at 03/17/16 0923  . calcitRIOL (ROCALTROL) capsule 0.5 mcg  0.5 mcg Oral Daily Samella Parr, NP   0.5 mcg at 03/17/16 0921  . heparin injection 5,000 Units  5,000 Units Subcutaneous Q8H Samella Parr, NP   5,000 Units at 03/17/16 0840  . hydrocortisone cream 1 %   Topical TID Samella Parr, NP      . hydroxychloroquine (PLAQUENIL) tablet 200 mg  200 mg Oral BID Samella Parr, NP   200 mg at 03/17/16 [id number]  . mycophenolate (CELLCEPT) capsule 1,000 mg  1,000 mg Oral BID Samella Parr, NP   1,000 mg at 03/17/16 [id number]  . ondansetron (ZOFRAN) tablet 4 mg  4 mg Oral Q6H PRN Samella Parr, NP       Or  . ondansetron [facility]) injection 4 mg  4 mg Intravenous Q6H PRN Samella Parr, NP      . predniSONE (DELTASONE) tablet 5 mg  5 mg Oral Daily Samella Parr, NP   5 mg at 03/17/16 0921  . sevelamer carbonate (RENVELA) tablet 3,200 mg  3,200 mg Oral TID WC Samella Parr, NP      . sevelamer carbonate (RENVELA) tablet 800-1,600 mg  800-1,600 mg Oral With snacks Kinnie Feil, MD   1,600 mg at 03/17/16 0927   Labs: Basic Metabolic  Panel:  Recent Labs Lab 03/17/16 0420  NA 141  K 4.5  CL 92*  CO2 25  GLUCOSE 108*  BUN 71*  CREATININE 22.36*  CALCIUM 10.6*   Liver Function Tests:  Recent Labs Lab 03/17/16 0420  AST 31  ALT 28  ALKPHOS 90  BILITOT 0.6  PROT 7.7  ALBUMIN 3.6    Recent Labs Lab 03/17/16 0420  LIPASE 48   CBC:  Recent Labs Lab 03/17/16 0420  WBC 6.7  HGB 12.7  HCT 40.0  MCV 86.0  PLT 349    ROS: As per HPI otherwise negative.  Physical Exam: Filed Vitals:   03/17/16 0518 03/17/16 0530 03/17/16 0628 03/17/16 0758  BP:  127/93 104/78 100/73  Pulse: 61   109  Temp:    97.8 F (36.6 C)  TempSrc:    Oral  Resp:      SpO2: 100% 98%  100%     General: Well developed, well nourished, in no acute distress sitting in bed Head: Normocephalic, atraumatic- wears a wig due to hair loss, sclera non-icteric, mucus membranes are moist Neck: Supple. JVD not elevated. Lungs: Clear bilaterally to auscultation without wheezes, rales, or rhonchi. Breathing is unlabored. Heart: RRR with S1 S2. No murmurs, rubs, or gallops appreciated. Abdomen: Soft with striae, non-tender, non-distended with normoactive bowel sounds. No rebound/guarding. No obvious abdominal masses. Lower extremities: tr edema or ischemic changes, no open wounds  Neuro: Alert and oriented X 3. Moves all extremities spontaneously. Psych:  Responds to questions appropriately with a normal affect. Dialysis Access: PD cath - exit no drainage or erythema  Dialysis Orders: [facility] since 09/2015  7 days a week EDW 102;  5 exchanges fill 3 L(just increased but never accomplished) dwell 2 hr last fill 1 L (just started)- fills and drain 20 - 30 min   no ESA, Fe  Assessment/Plan: 1.  Nausea and vomiting - recurrent admission - initially had abdominal pain with fills now nausea with fills but not at rest or when large volume not instilled.: the medicine previously given (zofran) didn't help - try phenergan. Nausea and  vomiting are worse during fills/CCPD which is not really consistent with uremia, thought Cr is certainly high.   2.  ESRD secondary to SLE-  On CCPD 7 days a week- not getting to current max Rx dose- plan 3 daytime exchanges today at lower volume of 2 L and five 2 L exchanges tonight to see if she can tolerate this volume of fill without pain;  Recheck labs in am for response to dialysis.  If she can tolerate 2 L try to increase to 2.5 L -check cell count but expect to be ok, no symptoms of perotinitis 3.  Hypertension/volume  - BP ok - use 1.5s today 4.  Anemia  - hgb 12.7 - stable off ESA/Fe 5.  Metabolic bone disease -  Mildly hypercalcemic P high in outpt setting last Ca/P 10.4/9 iPTH 558 - on renvela - dialysis staff trying to enroll in sensipar program; continue binders/calcitriol- actually will hold calcitriol as hypercalcemia can give nausea as well 6. Nutrition - needs low P diet, currently on CL 7. SLE - on cellcept/pred/plaquenil  Myriam Jacobson, PA-C [place] [phone] 03/17/2016, 10:22 AM   Patient seen and examined, agree with above note with above modifications. On PD since January- this is second admission for really mostly N/V- BUN and creatinine are high but she seems compliant ? Albumin OK- no PET test data.  Will try to ramp up the dialysis to see if that results in improvement in her symptoms- do daytime CAPD then CCPD tonight- also stop rocaltrol as hyepercalcemia can cause nausea- she says not constipated- awaiting cell count of PD fluid  Corliss Parish, MD 03/17/2016

## 2016-03-17 NOTE — H&P (Signed)
History and Physical    [name] [name] [id number] DOB: [birth year] DOA: 03/17/2016   PCP: Garret Reddish, MD   Patient coming from/Resides with: Private residence/lives with sister  Chief Complaint: Intractable nausea and vomiting  HPI: [name] [name] is a 35 y.o. female with medical history significant for known lupus nephritis now on chronic peritoneal dialysis, history of lupus mediated cardiomyopathy with associated pulmonary hypertension (confirmed on right heart cath 2011), hypertension, gout, TIA, anemia of chronic kidney disease who presented to the hospital with intractable nausea and vomiting. Patient reports that Thursday morning she became very nauseous and had 1 episode of emesis but after vomiting felt better so she went to work as usual. While at work she continued to have severe nausea and multiple episodes of emesis so she returned home. Just prior to each episode of emesis she had some crampy abdominal pain that has not been otherwise having any abdominal pain. She's had no fevers. She reports that her PD fluid is not cloudy. She has not had any recent flares of lupus and she has not had any medication changes per her report including any lupus medications. She was recently discharged on 5/4 after having intractable nausea and vomiting presumed related to oral antibiotics for recent treatment of cellulitis. Patient does report that there were concerns that despite nocturnal dialysis therapies she was incompletely dialyzing and therefore a change was made into her cycling and/or dialysate fluid the patient had not initiated these changes because of her ongoing nausea as stating she was concerned if she felt her abdomen with PD fluid that would make her nausea and vomiting worse.  ED Course:  PO temp 97.6-BP 132/99-initial pulse 107-respirations 19 Follow-up vital signs: BP 10/26/1976-pulse 61 Lab data: Sodium 141 potassium 4.5, chloride 92, CO2 25, BUN 71, creatinine  22.36, anion gap 24, lipase and LFTs are normal, WBC 6700 and differential obtained, hemoglobin 12.7, platelets 349,000 Zofran 4 mg 2 doses Morphine 4 mg IV 1 dose  Review of Systems:  In addition to the HPI above,  No Fever-chills, myalgias or other constitutional symptoms No Headache, changes with Vision or hearing, new weakness, tingling, numbness in any extremity, No problems swallowing food or Liquids, indigestion/reflux No Chest pain, Cough or Shortness of Breath, palpitations, orthopnea or DOE No melena or hematochezia, no dark tarry stools No dysuria, hematuria or flank pain No new skin rashes, lesions, masses or bruises, No new joints pains-aches No recent weight gain or loss No polyuria, polydypsia or polyphagia,   Past Medical History  Diagnosis Date  . Unspecified essential hypertension   . Unspecified deficiency anemia   . Systemic lupus erythematosus ([place])   . Secondary cardiomyopathy, unspecified   . ERYTHEMATOSUS, LUPUS 08/07/2006  . CHF (congestive heart failure) ([place]) 5-6 yrs ago  hosp  . Chronic kidney disease   . Intestinal infection due to Clostridium difficile     Past Surgical History  Procedure Laterality Date  . Kidney biopsies      to determine lupus nephritis     reports that she has never smoked. She has never used smokeless tobacco. She reports that she drinks alcohol. She reports that she does not use illicit drugs.  Mobility: Without assistive devices Work history: Works in Press photographer at [employer]  . Lisinopril Swelling  . Nsaids     She should avoid all due to renal insufficiency    Family History  Problem Relation Age of Onset  . Lupus Mother   .  Cancer Maternal Grandmother     unknown  . Prostate cancer Paternal Grandfather   . Diabetes Brother   . Diabetes      mat cousin     Prior to Admission medications   Medication Sig Start Date End Date Taking? Authorizing Provider  allopurinol (ZYLOPRIM)  300 MG tablet TAKE 1 TABLET BY MOUTH DAILY 06/07/15  Yes Marin Olp, MD  calcitRIOL (ROCALTROL) 0.5 MCG capsule Take 0.5 mcg by mouth daily as needed. 02/25/16  Yes Historical Provider, MD  desonide (DESOWEN) 0.05 % ointment Apply 1 application topically daily.  09/15/14  Yes Historical Provider, MD  doxycycline (VIBRA-TABS) 100 MG tablet Take 1 tablet (100 mg total) by mouth 2 (two) times daily. 03/06/16  Yes Marin Olp, MD  gentamicin cream (GARAMYCIN) 0.1 % Apply 1 application topically 3 (three) times daily as needed. For cleaning exit site. 12/28/15  Yes Historical Provider, MD  hydroxychloroquine (PLAQUENIL) 200 MG tablet TAKE 1 TABLET BY MOUTH TWICE A DAY 10/26/14  Yes Bruce Kendall Flack, MD  labetalol (NORMODYNE) 200 MG tablet Take 1 tablet (200 mg total) by mouth 3 (three) times daily. HOLD until follow-up with Dr Joelyn Oms Patient taking differently: Take 200 mg by mouth 2 (two) times daily. HOLD until follow-up with Dr Jolene Provost 02/24/16  Yes Ripudeep Krystal Eaton, MD  mycophenolate (CELLCEPT) 500 MG tablet Take 1,000 mg by mouth 2 (two) times daily.    Yes Historical Provider, MD  predniSONE (DELTASONE) 10 MG tablet Take 5 mg by mouth daily.  01/26/16  Yes Historical Provider, MD  RENVELA 800 MG tablet Take 1,600-3,200 mg by mouth as directed. 4 tablet by mouth three times daily with meals and 1-2 tablets with snacks. 01/26/16  Yes Historical Provider, MD  torsemide (DEMADEX) 100 MG tablet Take 100 mg by mouth every morning. 02/25/16  Yes Historical Provider, MD  triamcinolone lotion (KENALOG) 0.1 % Apply 1 application topically 2 (two) times daily as needed (dermatitis).  03/06/16  Yes Historical Provider, MD  valACYclovir (VALTREX) 1000 MG tablet Take 1,000 mg by mouth daily as needed (for flares.). Reported on 02/14/2016 10/29/13  Yes Historical Provider, MD    Physical Exam: Filed Vitals:   03/17/16 0516 03/17/16 0518 03/17/16 0530 03/17/16 0628  BP: 126/106  127/93 104/78  Pulse:  61    Temp:        Resp:      SpO2:  100% 98%       Constitutional: NAD, calm, comfortable Eyes: PERRL, lids and conjunctivae normal ENMT: Mucous membranes are moist. Posterior pharynx clear of any exudate or lesions.Normal dentition.  Neck: normal, supple, no masses, no thyromegaly Respiratory: clear to auscultation bilaterally, no wheezing, no crackles. Normal respiratory effort. No accessory muscle use.  Cardiovascular: Regular rate and rhythm, no murmurs / rubs / gallops. No extremity edema. 2+ pedal pulses. No carotid bruits.  Abdomen: no tenderness, no masses palpated. No hepatosplenomegaly. Bowel sounds positive.  Musculoskeletal: no clubbing / cyanosis. No joint deformity upper and lower extremities. Good ROM, no contractures. Normal muscle tone.  Skin: no rashes, lesions, ulcers. No induration Neurologic: CN 2-12 grossly intact. Sensation intact, DTR normal. Strength 5/5 x all 4 extremities.  Psychiatric: Normal judgment and insight. Alert and oriented x 3. Normal mood.    Labs on Admission: I have personally reviewed following labs and imaging studies  CBC:  Recent Labs Lab 03/17/16 0420  WBC 6.7  HGB 12.7  HCT 40.0  MCV 86.0  PLT 349  Basic Metabolic Panel:  Recent Labs Lab 03/17/16 0420  NA 141  K 4.5  CL 92*  CO2 25  GLUCOSE 108*  BUN 71*  CREATININE 22.36*  CALCIUM 10.6*   GFR: Estimated Creatinine Clearance: 4.2 mL/min (by C-G formula based on Cr of 22.36). Liver Function Tests:  Recent Labs Lab 03/17/16 0420  AST 31  ALT 28  ALKPHOS 90  BILITOT 0.6  PROT 7.7  ALBUMIN 3.6    Recent Labs Lab 03/17/16 0420  LIPASE 48   No results for input(s): AMMONIA in the last 168 hours. Coagulation Profile: No results for input(s): INR, PROTIME in the last 168 hours. Cardiac Enzymes: No results for input(s): CKTOTAL, CKMB, CKMBINDEX, TROPONINI in the last 168 hours. BNP (last 3 results) No results for input(s): PROBNP in the last 8760 hours. HbA1C: No results  for input(s): HGBA1C in the last 72 hours. CBG: No results for input(s): GLUCAP in the last 168 hours. Lipid Profile: No results for input(s): CHOL, HDL, LDLCALC, TRIG, CHOLHDL, LDLDIRECT in the last 72 hours. Thyroid Function Tests: No results for input(s): TSH, T4TOTAL, FREET4, T3FREE, THYROIDAB in the last 72 hours. Anemia Panel: No results for input(s): VITAMINB12, FOLATE, FERRITIN, TIBC, IRON, RETICCTPCT in the last 72 hours. Urine analysis:    Component Value Date/Time   COLORURINE YELLOW 02/20/2016 2012   APPEARANCEUR CLOUDY* 02/20/2016 2012   LABSPEC 1.017 02/20/2016 2012   PHURINE 5.5 02/20/2016 2012   GLUCOSEU NEGATIVE 02/20/2016 2012   HGBUR MODERATE* 02/20/2016 2012   BILIRUBINUR NEGATIVE 02/20/2016 2012   KETONESUR NEGATIVE 02/20/2016 2012   PROTEINUR 100* 02/20/2016 2012   UROBILINOGEN 0.2 08/06/2007 1455   NITRITE NEGATIVE 02/20/2016 2012   LEUKOCYTESUR SMALL* 02/20/2016 2012   Sepsis Labs: @LABRCNTIP (procalcitonin:4,lacticidven:4) )No results found for this or any previous visit (from the past 240 hour(s)).   Radiological Exams on Admission: No results found.   Assessment/Plan Principal Problem:   Intractable nausea and vomiting -Suspect related to elevated BUN and creatinine in setting of incomplete dialysis -Nephrology consulted; for completeness of exam Nephro has ordered PD fluid cytology and culture -Symptom management; treat underlying causes -Does not appear to be volume depleted -On exam no abdominal pain, no leukocytosis,and no fevers that would be suggestive of SBP; patient also reports non-cloudy peritoneal dialysate fluid  Active Problems:   History of recurrent cellulitis of lower leg -Although patient has chronic lower extremity swelling there is no evidence of cellulitis at time of admission    CKD (chronic kidney disease) stage V requiring chronic peritoneal dialysis  -Nephrology to manage -Baseline BUN and creatinine appear to be 47 and  16.5 -Continue calcitriol and Renvela    Pulmonary hypertension, secondary/Secondary cardiomyopathy -Patient currently not following with a cardiologist -Right heart cath 2011 demonstrated mild to moderate pulmonary hypertension -Previously has significant depressed LV function of EF 25% but last echo 2015 revealed normal LVEF 55-60% but severe LVH -We'll obtain EKG and echo this admission -Patient makes little to no urine so have discontinued Demadex at this juncture    SLE -Follows with outpatient rheumatologist Dr. Ardeen Jourdain -Continue preadmission CellCept, Deltasone and Plaquenil    Essential hypertension -Current blood pressure suboptimal so we'll hold preadmission Normodyne    History of TIA (transient ischemic attack) -Currently asymptomatic -Not on aspirin or statin prior to admission    Gout -Currently no evidence of acute exacerbation -Continue Zyloprim      DVT prophylaxis: Subcutaneous heparin Code Status: Full code  Family Communication: No family at  bedside Disposition Plan: Anticipate discharge back to previous sonogram at one medically stable Consults called: Nephrologist on call by EDP Admission status: Observation/medical floor    ELLIS,ALLISON L. ANP-BC [facility] Pager [phone]   If 7PM-7AM, please contact night-coverage [email or web address] Password [facility]  03/17/2016, 7:44 AM

## 2016-03-17 NOTE — ED Notes (Addendum)
Called 6E to attempt report and notify that nephrologist wants peritoneal fluid drawn from dialysis port STAT.

## 2016-03-17 NOTE — Progress Notes (Signed)
Echocardiogram 2D Echocardiogram has been performed.  Tresa Res 03/17/2016, 12:23 PM

## 2016-03-17 NOTE — ED Notes (Addendum)
abd pain, nausea, vomiting x 4 that started during dialysis today at 1900. Pt is actively vomiting during triage

## 2016-03-17 NOTE — ED Provider Notes (Signed)
CSN: [id number]     Arrival date & time 03/17/16  0350 History   First MD Initiated Contact with Patient 03/17/16 0403     Chief Complaint  Patient presents with  . Emesis  . Abdominal Pain     (Consider location/radiation/quality/duration/timing/severity/associated sxs/prior Treatment) HPI   Patient has PMH of essential hypertension, SLE, secondary cardiomyopathy, CHF, chronic kidney disease, hx of cellulitis, hx of clostridium difficile. She comes to the ER with complaints of diffuse intermittent abdominal pain, nausea and vomiting that started during her home peritoneal dialysis at 1900. She says that she has been unable to keep fluids down, no blood or bile in vomit. After receiving the IV Zofran she says her pain is now 1/10.  She describes the pain as sharp, and worsens just before she vomits. She denies having rash, fevers, back pain, headache, cough, dysuria, and diarrhea.  Past Medical History  Diagnosis Date  . Unspecified essential hypertension   . Unspecified deficiency anemia   . Systemic lupus erythematosus ([place])   . Secondary cardiomyopathy, unspecified   . ERYTHEMATOSUS, LUPUS 08/07/2006  . CHF (congestive heart failure) ([place]) 5-6 yrs ago  hosp  . Chronic kidney disease   . Intestinal infection due to Clostridium difficile    Past Surgical History  Procedure Laterality Date  . Kidney biopsies      to determine lupus nephritis   Family History  Problem Relation Age of Onset  . Lupus Mother   . Cancer Maternal Grandmother     unknown  . Prostate cancer Paternal Grandfather   . Diabetes Brother   . Diabetes      mat cousin   Social History  Substance Use Topics  . Smoking status: Never Smoker   . Smokeless tobacco: Never Used  . Alcohol Use: 0.0 oz/week    0 Standard drinks or equivalent per week     Comment: socially 2x a month   OB History    No data available     Review of Systems  Review of Systems All other systems negative except as  documented in the HPI. All pertinent positives and negatives as reviewed in the HPI.   Allergies  Lisinopril and Nsaids  Home Medications   Prior to Admission medications   Medication Sig Start Date End Date Taking? Authorizing Provider  allopurinol (ZYLOPRIM) 300 MG tablet TAKE 1 TABLET BY MOUTH DAILY 06/07/15  Yes Marin Olp, MD  calcitRIOL (ROCALTROL) 0.5 MCG capsule Take 0.5 mcg by mouth daily as needed. 02/25/16  Yes Historical Provider, MD  desonide (DESOWEN) 0.05 % ointment Apply 1 application topically daily.  09/15/14  Yes Historical Provider, MD  doxycycline (VIBRA-TABS) 100 MG tablet Take 1 tablet (100 mg total) by mouth 2 (two) times daily. 03/06/16  Yes Marin Olp, MD  gentamicin cream (GARAMYCIN) 0.1 % Apply 1 application topically 3 (three) times daily as needed. For cleaning exit site. 12/28/15  Yes Historical Provider, MD  hydroxychloroquine (PLAQUENIL) 200 MG tablet TAKE 1 TABLET BY MOUTH TWICE A DAY 10/26/14  Yes Bruce Kendall Flack, MD  labetalol (NORMODYNE) 200 MG tablet Take 1 tablet (200 mg total) by mouth 3 (three) times daily. HOLD until follow-up with Dr Joelyn Oms Patient taking differently: Take 200 mg by mouth 2 (two) times daily. HOLD until follow-up with Dr Jolene Provost 02/24/16  Yes Ripudeep Krystal Eaton, MD  mycophenolate (CELLCEPT) 500 MG tablet Take 1,000 mg by mouth 2 (two) times daily.    Yes Historical Provider, MD  predniSONE (  DELTASONE) 10 MG tablet Take 5 mg by mouth daily.  01/26/16  Yes Historical Provider, MD  RENVELA 800 MG tablet Take 1,600-3,200 mg by mouth as directed. 4 tablet by mouth three times daily with meals and 1-2 tablets with snacks. 01/26/16  Yes Historical Provider, MD  torsemide (DEMADEX) 100 MG tablet Take 100 mg by mouth every morning. 02/25/16  Yes Historical Provider, MD  triamcinolone lotion (KENALOG) 0.1 % Apply 1 application topically 2 (two) times daily as needed (dermatitis).  03/06/16  Yes Historical Provider, MD  valACYclovir (VALTREX) 1000 MG  tablet Take 1,000 mg by mouth daily as needed (for flares.). Reported on 02/14/2016 10/29/13  Yes Historical Provider, MD   BP 127/93 mmHg  Pulse 61  Temp(Src) 97.6 F (36.4 C)  Resp 19  SpO2 98%  LMP 02/25/2016 Physical Exam  Constitutional: She appears well-developed and well-nourished. No distress.  HENT:  Head: Normocephalic and atraumatic.  Right Ear: Tympanic membrane and ear canal normal.  Left Ear: Tympanic membrane and ear canal normal.  Nose: Nose normal.  Mouth/Throat: Uvula is midline, oropharynx is clear and moist and mucous membranes are normal.  Eyes: Pupils are equal, round, and reactive to light.  Neck: Normal range of motion. Neck supple.  Cardiovascular: Normal rate and regular rhythm.   Pulmonary/Chest: Effort normal.  Abdominal: Soft. Bowel sounds are normal. There is tenderness (diffuse, mild tenderness).  No signs of abdominal distention  Musculoskeletal:  No LE swelling  Neurological: She is alert.  Acting at baseline  Skin: Skin is warm and dry. No rash noted.  Nursing note and vitals reviewed.   ED Course  Procedures (including critical care time) Labs Review Labs Reviewed  COMPREHENSIVE METABOLIC PANEL - Abnormal; Notable for the following:    Chloride 92 (*)    Glucose, Bld 108 (*)    BUN 71 (*)    Creatinine, Ser 22.36 (*)    Calcium 10.6 (*)    GFR calc non Af Amer 2 (*)    GFR calc Af Amer 2 (*)    Anion gap 24 (*)    All other components within normal limits  LIPASE, BLOOD  CBC  URINALYSIS, ROUTINE W REFLEX MICROSCOPIC (NOT AT [facility])  POC URINE PREG, ED    Imaging Review No results found. I have personally reviewed and evaluated these images and lab results as part of my medical decision-making.   EKG Interpretation None      MDM   Final diagnoses:  ESRD on dialysis (HCC)  Non-intractable vomiting with nausea, vomiting of unspecified type   Discussed case with Dr. Lita Mains, the patient is likely uremic. Questionable if she  is under dialyzing herself at home. Her vitals signs are unremarkable. She was doing better after a dose of Zofran but her vomiting started again followed by abdominal pain.  The patient has a gap of 24, creatinine is 22.36 which is 2.3. Higher then just 8 days ago.  Patient was admitted for an extended period of time and discharged earlier this month at which time the Cr was starting to trend down, her baseline is between 5-6.  I spoke with Dr. Tamala Julian, R. Regarding admission. He requests calling nephrology to consult on this case and to give recommendations. Patient is currently hemodynamically stable. Admit to inpatient, med-surg, [facility], [place] admits  Filed Vitals:   03/17/16 0518 03/17/16 0530  BP:  127/93  Pulse: 61   Temp:    Resp:      @ 6:27  am I spoke with Dr. Jimmy Footman who requests that a cell peritoneal panel with diff be done to evaluate for peritonitis. If the RN's here in the ED do not how to draw the fluid off he recommends we call the nurses on [address].  Delos Haring, PA-C 03/17/16 [id number]  Julianne Rice, MD 03/20/16 1301

## 2016-03-17 NOTE — Progress Notes (Signed)
Carryover patient received from ED Green, PA  pmh of systemic lupus on peritoneal dialysis; who presents with N/V/abdominal pain. Baseline Cr 5, but  Cr 22 on admission with a BUN of 71. Anion gap of 21. Appears patient previously admitted with similar symptoms previously thought to be 2/2 uremia from being noncompliant with peritoneal dialysis as recommended from 7 PM to 7 AM. ED Ms.Green PA to consult nephrology in regards to patient. Patient afebrile with vitals otherwise stable. Transfer to a MedSurg bed.

## 2016-03-18 ENCOUNTER — Observation Stay (HOSPITAL_COMMUNITY): Payer: BLUE CROSS/BLUE SHIELD

## 2016-03-18 DIAGNOSIS — R1110 Vomiting, unspecified: Secondary | ICD-10-CM

## 2016-03-18 DIAGNOSIS — Z8673 Personal history of transient ischemic attack (TIA), and cerebral infarction without residual deficits: Secondary | ICD-10-CM | POA: Diagnosis not present

## 2016-03-18 DIAGNOSIS — I10 Essential (primary) hypertension: Secondary | ICD-10-CM | POA: Diagnosis not present

## 2016-03-18 DIAGNOSIS — M1A39X0 Chronic gout due to renal impairment, multiple sites, without tophus (tophi): Secondary | ICD-10-CM

## 2016-03-18 DIAGNOSIS — M329 Systemic lupus erythematosus, unspecified: Secondary | ICD-10-CM | POA: Diagnosis present

## 2016-03-18 DIAGNOSIS — D631 Anemia in chronic kidney disease: Secondary | ICD-10-CM | POA: Diagnosis present

## 2016-03-18 DIAGNOSIS — I429 Cardiomyopathy, unspecified: Secondary | ICD-10-CM | POA: Diagnosis present

## 2016-03-18 DIAGNOSIS — M3214 Glomerular disease in systemic lupus erythematosus: Secondary | ICD-10-CM | POA: Diagnosis present

## 2016-03-18 DIAGNOSIS — Z8619 Personal history of other infectious and parasitic diseases: Secondary | ICD-10-CM | POA: Diagnosis not present

## 2016-03-18 DIAGNOSIS — M109 Gout, unspecified: Secondary | ICD-10-CM | POA: Diagnosis present

## 2016-03-18 DIAGNOSIS — Y92009 Unspecified place in unspecified non-institutional (private) residence as the place of occurrence of the external cause: Secondary | ICD-10-CM | POA: Diagnosis not present

## 2016-03-18 DIAGNOSIS — N186 End stage renal disease: Secondary | ICD-10-CM | POA: Diagnosis present

## 2016-03-18 DIAGNOSIS — L03119 Cellulitis of unspecified part of limb: Secondary | ICD-10-CM

## 2016-03-18 DIAGNOSIS — Z992 Dependence on renal dialysis: Secondary | ICD-10-CM | POA: Diagnosis not present

## 2016-03-18 DIAGNOSIS — Z79899 Other long term (current) drug therapy: Secondary | ICD-10-CM | POA: Diagnosis not present

## 2016-03-18 DIAGNOSIS — I151 Hypertension secondary to other renal disorders: Secondary | ICD-10-CM | POA: Diagnosis present

## 2016-03-18 DIAGNOSIS — N2581 Secondary hyperparathyroidism of renal origin: Secondary | ICD-10-CM | POA: Diagnosis present

## 2016-03-18 DIAGNOSIS — I272 Other secondary pulmonary hypertension: Secondary | ICD-10-CM | POA: Diagnosis present

## 2016-03-18 DIAGNOSIS — K5900 Constipation, unspecified: Secondary | ICD-10-CM | POA: Diagnosis present

## 2016-03-18 DIAGNOSIS — L930 Discoid lupus erythematosus: Secondary | ICD-10-CM

## 2016-03-18 DIAGNOSIS — T3695XA Adverse effect of unspecified systemic antibiotic, initial encounter: Secondary | ICD-10-CM | POA: Diagnosis present

## 2016-03-18 DIAGNOSIS — K2960 Other gastritis without bleeding: Secondary | ICD-10-CM | POA: Diagnosis present

## 2016-03-18 LAB — RENAL FUNCTION PANEL
ALBUMIN: 3.1 g/dL — AB (ref 3.5–5.0)
Anion gap: 21 — ABNORMAL HIGH (ref 5–15)
BUN: 71 mg/dL — AB (ref 6–20)
CHLORIDE: 88 mmol/L — AB (ref 101–111)
CO2: 27 mmol/L (ref 22–32)
Calcium: 10 mg/dL (ref 8.9–10.3)
Creatinine, Ser: 21.34 mg/dL — ABNORMAL HIGH (ref 0.44–1.00)
GFR, EST AFRICAN AMERICAN: 2 mL/min — AB (ref >60)
GFR, EST NON AFRICAN AMERICAN: 2 mL/min — AB (ref >60)
Glucose, Bld: 85 mg/dL (ref 65–99)
PHOSPHORUS: 12.1 mg/dL — AB (ref 2.5–4.6)
POTASSIUM: 4 mmol/L (ref 3.5–5.1)
Sodium: 136 mmol/L (ref 135–145)

## 2016-03-18 LAB — CBC
HEMATOCRIT: 36.9 % (ref 36.0–46.0)
HEMOGLOBIN: 11.3 g/dL — AB (ref 12.0–15.0)
MCH: 26.9 pg (ref 26.0–34.0)
MCHC: 30.6 g/dL (ref 30.0–36.0)
MCV: 87.9 fL (ref 78.0–100.0)
Platelets: 306 10*3/uL (ref 150–400)
RBC: 4.2 MIL/uL (ref 3.87–5.11)
RDW: 15.4 % (ref 11.5–15.5)
WBC: 5.7 10*3/uL (ref 4.0–10.5)

## 2016-03-18 LAB — HCG, QUANTITATIVE, PREGNANCY: HCG, BETA CHAIN, QUANT, S: 4 m[IU]/mL (ref <5)

## 2016-03-18 MED ORDER — PROCHLORPERAZINE EDISYLATE 5 MG/ML IJ SOLN
10.0000 mg | Freq: Three times a day (TID) | INTRAMUSCULAR | Status: DC | PRN
  Administered 2016-03-18 – 2016-03-21 (×3): 10 mg via INTRAVENOUS
  Filled 2016-03-18 (×3): qty 2

## 2016-03-18 MED ORDER — PANTOPRAZOLE SODIUM 40 MG PO TBEC: 40.0000 mg | DELAYED_RELEASE_TABLET | Freq: Two times a day (BID) | ORAL | Status: DC

## 2016-03-18 MED ORDER — PROMETHAZINE HCL 25 MG PO TABS
25.0000 mg | ORAL_TABLET | Freq: Three times a day (TID) | ORAL | Status: DC | PRN
  Administered 2016-03-18: 25 mg via ORAL
  Filled 2016-03-18: qty 1

## 2016-03-18 MED ORDER — PROMETHAZINE HCL 25 MG RE SUPP: 25.0000 mg | Freq: Four times a day (QID) | RECTAL | Status: DC | PRN

## 2016-03-18 MED ORDER — SUCRALFATE 1 GM/10ML PO SUSP
1.0000 g | Freq: Three times a day (TID) | ORAL | Status: DC
  Administered 2016-03-18 – 2016-03-21 (×11): 1 g via ORAL
  Filled 2016-03-18 (×10): qty 10

## 2016-03-18 NOTE — Progress Notes (Signed)
Patient still vomiting after IV Zofran given at 8:56 and PO Phenergan at 10:01.  MD notified.

## 2016-03-18 NOTE — Progress Notes (Signed)
[facility] Progress Note  Assessment/Plan: 1. Nausea and abdominal pain- recurrent admission - initially had abdominal pain with fills now nausea with fills but not at rest or when large volume not instilled.: the medicine previously given (zofran) didn't help - ordered phenergan but zofran given. Nausea and abdominal pain are worse during fills/CCPD which is not really consistent with uremia.  Some nausea this am and usual fill discomfort. Cell count insignificant 2. ESRD secondary to SLE- On CCPD 7 days a week- not getting to current max Rx dose. Cr only decreased from 22 to 21 BUN unchanged in the past 24 hours.  She had 3 daytime 2 L exchanges yesterday and partial CCPD when labs drawn this am. Plan increase daytime volume exchanges  today to 2.5 L q 2 hr x 3 then 2.5 L fills tonight.  Check labs at 7 am to reflect more dialysis. 3. BP/volume - BP ok - use 1.5s again. Discussed with RN - we need wt after next drain. 4. Anemia - hgb 12.7 > 11.3 off ESA/Fe - repeat in am 5. Metabolic bone disease - Mildly hypercalcemic P high in outpt setting last Ca/P 10.4/9 iPTH 558 - on renvela - dialysis staff trying to enroll in sensipar program; calcitriol held due to hypercalcemia - renvela ordered for binder P 12.9 - obviously hasn't been taking  6. Nutrition - needs low P diet, currently on CL- probably can advance 7. SLE - on cellcept/pred/plaquenil  Myriam Jacobson, PA-C [place] [phone] 03/18/2016,9:27 AM  LOS: 1 day    Patient seen and examined, agree with above note with above modifications. In spite of increased PD reg- numbers unchanged- has nausea this AM.  Will try another 24 hours of ramped up PD to see if numbers will go down and if it will correlate with symptom improvement- other etiology of nausea ?? meds (celcept Illene Regulus) but does not seem to fit timeline.  If numbers unchaged tomorrow may need trial of hemodialysis ?  Corliss Parish,  MD 03/18/2016     Subjective:   No pain or nausea yesterday during daytime exchanges or during CCPD last night UNTIL last fill this am, she had/has nausea. MAR shows she was given zofran last night and this am.  Objective Filed Vitals:   03/17/16 0758 03/17/16 1630 03/17/16 1959 03/18/16 0542  BP: 100/73 107/73 106/79 105/72  Pulse: 109 99 101 102  Temp: 97.8 F (36.6 C) 98.2 F (36.8 C) 98.3 F (36.8 C) 98.1 F (36.7 C)  TempSrc: Oral Oral Oral Oral  Resp:  17 18 18   SpO2: 100% 99% 97% 100%   Physical Exam General:supine in bed Heart: tachy reg Lungs: no rales Abdomen: soft NT with 2 L instilled Extremities: no edema Dialysis Access: PD cath  Dialysis Orders: [facility] since 09/2015 7 days a week EDW 102; 5 exchanges fill 3 L(just increased but never accomplished) dwell 2 hr last fill 1 L (just started)- fills and drain 20 - 30 min no ESA, Fe  Additional Objective Labs: Basic Metabolic Panel:  Recent Labs Lab 03/17/16 0420 03/18/16 0438  NA 141 136  K 4.5 4.0  CL 92* 88*  CO2 25 27  GLUCOSE 108* 85  BUN 71* 71*  CREATININE 22.36* 21.34*  CALCIUM 10.6* 10.0  PHOS  --  12.1*   Liver Function Tests:  Recent Labs Lab 03/17/16 0420 03/18/16 0438  AST 31  --   ALT 28  --   ALKPHOS  90  --   BILITOT 0.6  --   PROT 7.7  --   ALBUMIN 3.6 3.1*    Recent Labs Lab 03/17/16 0420  LIPASE 48   CBC:  Recent Labs Lab 03/17/16 0420 03/18/16 0438  WBC 6.7 5.7  HGB 12.7 11.3*  HCT 40.0 36.9  MCV 86.0 87.9  PLT 349 306   Blood Culture    Component Value Date/Time   SDES FLUID PERITONEAL 03/17/2016 1526   SPECREQUEST NONE 03/17/2016 1526   CULT NO GROWTH 3 DAYS 02/21/2016 1830   REPTSTATUS 03/17/2016 FINAL 03/17/2016 1526    Medications:   . allopurinol  300 mg Oral Daily  . dialysis solution 1.5% low-MG/low-CA   Intraperitoneal Q24H  . gentamicin cream  1 application Topical Daily  . heparin  5,000 Units Subcutaneous Q8H  .  hydrocortisone cream   Topical TID  . hydroxychloroquine  200 mg Oral BID  . multivitamin  1 tablet Oral QHS  . mycophenolate  1,000 mg Oral BID  . predniSONE  5 mg Oral Daily  . sevelamer carbonate  3,200 mg Oral TID WC  . sevelamer carbonate  800-1,600 mg Oral With snacks

## 2016-03-18 NOTE — Progress Notes (Signed)
[facility] Progress Note  Patient: [name] [name] [id number]   PCP: Garret Reddish, MD DOB: April 19, 1981   DOA: 03/17/2016   DOS: 03/18/2016   Date of Service: the patient was seen and examined on 03/18/2016  Subjective: Continues to have nausea as well as generalized fatigue. Also complains of diffuse crampy intermittent abdominal pain. Comparison constipation. No diarrhea. No fever no chills. Nutrition: Minimal oral intake  Brief hospital course: Patient was admitted on 03/17/2016, with complaint of nausea and vomiting as well as abdominal pain, was found to have worsening azotemia likely related poor compliance with PD. Currently further plan is continue with the changes recommended by nephrology with PD and monitor for improvement.  Assessment and Plan: 1. Intractable nausea and vomiting Likely from uremia. X-ray abdomen does not show any acute abnormality. Lipase level negative. Peritoneal dialysate fluid Gram stain does not suggest any evidence of SBP. Continue when necessary Zofran and use when necessary Compazine and Phenergan for refractory nausea.  2. ESRD secondary to SLE. On peritoneal dialysis. 7 days a week. Not able to get current maximum treatment dose. Presenting with nausea and vomiting which is likely from uremia. Nephrology recommended changes in the peritoneal dialysis regimen. We will continue to monitor. No evidence of peritonitis. Holding calcitriol at present due to high calcium  3. SLE. Follows up with rheumatologist Dr. Ardeen Jourdain Continue CellCept, Deltasone and Plaquenil-home dose. On outpatient Benlysta IV. Also continue prednisone. If patient is not able to take orally, we may have to supplement with IV equivalent dose.  4. Essential hypertension. Blood pressure on lower side. Next and discontinue all antihypertensive medications at present.  5. Possible pill-induced gastritis.  Patient recently completed doxycycline course for 7  days. Her nausea and vomiting can also be present induced gastritis. We'll start the patient on PPI as well as Carafate.   6. History of TIA. Not on any antiplatelet medications at present. No focal deficit at present.  Pain management: When necessary Tylenol Activity: Currently independent Bowel regimen: last BM prior to admission Diet: Clear liquid diet, due to nausea and vomiting DVT Prophylaxis: subcutaneous Heparin  Advance goals of care discussion: Full code  Family Communication: family was present at bedside, at the time of interview. The pt provided permission to discuss medical plan with the family. Opportunity was given to ask question and all questions were answered satisfactorily.   Disposition:  Discharge to home, pending improvement in oral intake Expected discharge date: 03/20/2016  Consultants: Nephrology Procedures: Peritoneal dialysis  Antibiotics: Anti-infectives    Start     Dose/Rate Route Frequency Ordered Stop   03/17/16 1000  hydroxychloroquine (PLAQUENIL) tablet 200 mg     200 mg Oral 2 times daily 03/17/16 0803          Intake/Output Summary (Last 24 hours) at 03/18/16 1601 Last data filed at 03/18/16 1430  Gross per 24 hour  Intake   6840 ml  Output   3675 ml  Net   3165 ml   Filed Weights   03/18/16 1037  Weight: 94.3 kg (207 lb 14.3 oz)    Objective: Physical Exam: Filed Vitals:   03/17/16 1959 03/18/16 0542 03/18/16 0900 03/18/16 1037  BP: 106/79 105/72 114/70   Pulse: 101 102 101   Temp: 98.3 F (36.8 C) 98.1 F (36.7 C) 97.6 F (36.4 C)   TempSrc: Oral Oral Oral   Resp: 18 18 18    Height:    5\' 7"  (1.702 m)  Weight:  94.3 kg (207 lb 14.3 oz)  SpO2: 97% 100% 98%     General: Alert, Awake and Oriented to Time, Place and Person. Appear in mild distress Eyes: PERRL, Conjunctiva normal ENT: Oral Mucosa clear moist. Neck: no JVD, no Abnormal Mass Or lumps Cardiovascular: S1 and S2 Present, no Murmur, Peripheral Pulses  Present Respiratory: Bilateral Air entry equal and Decreased, Clear to Auscultation, no Crackles, no wheezes Abdomen: Bowel Sound present, Soft and mild tenderness Skin: no redness, no Rash  Extremities: no Pedal edema, no calf tenderness Neurologic: Grossly no focal neuro deficit. Bilaterally Equal motor strength  Data Reviewed: CBC:  Recent Labs Lab 03/17/16 0420 03/18/16 0438  WBC 6.7 5.7  HGB 12.7 11.3*  HCT 40.0 36.9  MCV 86.0 87.9  PLT 349 [id number]   Basic Metabolic Panel:  Recent Labs Lab 03/17/16 0420 03/18/16 0438  NA 141 136  K 4.5 4.0  CL 92* 88*  CO2 25 27  GLUCOSE 108* 85  BUN 71* 71*  CREATININE 22.36* 21.34*  CALCIUM 10.6* 10.0  PHOS  --  12.1*    Liver Function Tests:  Recent Labs Lab 03/17/16 0420 03/18/16 0438  AST 31  --   ALT 28  --   ALKPHOS 90  --   BILITOT 0.6  --   PROT 7.7  --   ALBUMIN 3.6 3.1*    Recent Labs Lab 03/17/16 0420  LIPASE 48   No results for input(s): AMMONIA in the last 168 hours. Coagulation Profile: No results for input(s): INR, PROTIME in the last 168 hours. Cardiac Enzymes: No results for input(s): CKTOTAL, CKMB, CKMBINDEX, TROPONINI in the last 168 hours. BNP (last 3 results) No results for input(s): PROBNP in the last 8760 hours.  CBG: No results for input(s): GLUCAP in the last 168 hours.  Studies: Dg Abd Portable 1v  03/18/2016  CLINICAL DATA:  Intractable nausea and vomiting. EXAM: PORTABLE ABDOMEN - 1 VIEW COMPARISON:  None. FINDINGS: The bowel gas pattern is normal. No radio-opaque calculi or other significant radiographic abnormality are seen. Peritoneal dialysis catheter noted. IMPRESSION: Negative. Electronically Signed   By: Kathreen Devoid   On: 03/18/2016 13:18     Scheduled Meds: . allopurinol  300 mg Oral Daily  . dialysis solution 1.5% low-MG/low-CA   Intraperitoneal Q24H  . gentamicin cream  1 application Topical Daily  . heparin  5,000 Units Subcutaneous Q8H  . hydrocortisone cream    Topical TID  . hydroxychloroquine  200 mg Oral BID  . multivitamin  1 tablet Oral QHS  . mycophenolate  1,000 mg Oral BID  . predniSONE  5 mg Oral Daily  . sevelamer carbonate  3,200 mg Oral TID WC  . sevelamer carbonate  800-1,600 mg Oral With snacks   Continuous Infusions:  PRN Meds: acetaminophen **OR** acetaminophen, heparin, heparin, ondansetron **OR** ondansetron (ZOFRAN) IV, promethazine **OR** prochlorperazine **OR** promethazine  Time spent: 30 minutes  Author: Berle Mull, MD [facility] Pager: [phone] 03/18/2016 4:01 PM  If 7PM-7AM, please contact night-coverage at [email or web address], password [facility]

## 2016-03-19 DIAGNOSIS — Z992 Dependence on renal dialysis: Secondary | ICD-10-CM

## 2016-03-19 LAB — RENAL FUNCTION PANEL
Albumin: 3.1 g/dL — ABNORMAL LOW (ref 3.5–5.0)
Anion gap: 22 — ABNORMAL HIGH (ref 5–15)
BUN: 69 mg/dL — ABNORMAL HIGH (ref 6–20)
CO2: 26 mmol/L (ref 22–32)
Calcium: 10 mg/dL (ref 8.9–10.3)
Chloride: 88 mmol/L — ABNORMAL LOW (ref 101–111)
Creatinine, Ser: 20.66 mg/dL — ABNORMAL HIGH (ref 0.44–1.00)
GFR calc Af Amer: 2 mL/min — ABNORMAL LOW (ref >60)
GFR calc non Af Amer: 2 mL/min — ABNORMAL LOW (ref >60)
Glucose, Bld: 90 mg/dL (ref 65–99)
Phosphorus: 10.6 mg/dL — ABNORMAL HIGH (ref 2.5–4.6)
Potassium: 4 mmol/L (ref 3.5–5.1)
Sodium: 136 mmol/L (ref 135–145)

## 2016-03-19 LAB — CBC
HCT: 37.8 % (ref 36.0–46.0)
Hemoglobin: 12 g/dL (ref 12.0–15.0)
MCH: 27.6 pg (ref 26.0–34.0)
MCHC: 31.7 g/dL (ref 30.0–36.0)
MCV: 87.1 fL (ref 78.0–100.0)
Platelets: 302 10*3/uL (ref 150–400)
RBC: 4.34 MIL/uL (ref 3.87–5.11)
RDW: 15 % (ref 11.5–15.5)
WBC: 6.2 10*3/uL (ref 4.0–10.5)

## 2016-03-19 LAB — MAGNESIUM: Magnesium: 2.2 mg/dL (ref 1.7–2.4)

## 2016-03-19 MED ORDER — CEFAZOLIN SODIUM-DEXTROSE 2-4 GM/100ML-% IV SOLN
2.0000 g | INTRAVENOUS | Status: AC
  Administered 2016-03-20: 2 g via INTRAVENOUS
  Filled 2016-03-19: qty 100

## 2016-03-19 MED ORDER — POLYETHYLENE GLYCOL 3350 17 G PO PACK
17.0000 g | PACK | Freq: Every day | ORAL | Status: DC
  Administered 2016-03-19 – 2016-03-20 (×2): 17 g via ORAL
  Filled 2016-03-19 (×2): qty 1

## 2016-03-19 NOTE — Progress Notes (Signed)
Patient PD catheter drained 2200 mL clear yellow.  Patient left dry per MD order.

## 2016-03-19 NOTE — Progress Notes (Signed)
Patient stated she did not want to do the manual exchanges due to she thinks it is not working.  Spoke with Dr. Moshe Cipro.  Will d/c CAPD & CCPD and drain patient per Dr. Moshe Cipro.

## 2016-03-19 NOTE — Progress Notes (Signed)
[facility] Progress Note  Assessment/Plan: 1. Nausea and abdominal pain- recurrent admission - initially had abdominal pain with fills now nausea with fills but not at rest or when large volume not instilled.  BUT trying to do more PD to achieve clearance so is making nausea worse.  Cell count insignificant.  Am going on theory that N is due to uremia- see plan below  2. ESRD secondary to SLE- On CCPD 7 days a week- not getting to current max Rx dose. Cr only decreased from 22 to 20 BUN essentially unchanged in the past 24 hours.  She had 3 daytime 2.5 L exchanges yesterday and partial CCPD when labs drawn this am.  Essentially 20 liters of HD with minimal change in BUN and creatinine ?- change day exchanges to longer on the off chance she is a low transporter? I want to do trial of HD to bring numbers down to see if nausea resolves- pt agrees.  If it doesn't then it is not uremia but if it does then it is 3. BP/volume - BP ok - use 1.5s again. Discussed with RN - we need wt after next drain. 4. Anemia - hgb 12.7 > 11.3 off ESA/Fe - repeat in am 5. Metabolic bone disease - Mildly hypercalcemic P high in outpt setting last Ca/P 10.4/9 iPTH 558 - on renvela - dialysis staff trying to enroll in sensipar program; calcitriol held due to hypercalcemia - renvela ordered for binder P 12.9 - obviously hasn't been taking - calcium is better but symptoms not- 6. Nutrition - needs low P diet, currently on CL- probably can advance 7. SLE - on cellcept/pred/plaquenil  Corliss Parish, MD 03/19/2016     Subjective:   Still nauseated - BUN and creatinine really not down much with max PD   Objective Filed Vitals:   03/18/16 1844 03/18/16 2037 03/19/16 0429 03/19/16 0943  BP: 112/78 95/62 103/74 118/64  Pulse: 98 118 108 90  Temp: 98.3 F (36.8 C) 98 F (36.7 C) 98.1 F (36.7 C) 97.4 F (36.3 C)  TempSrc: Oral Oral Oral Oral  Resp: 18 19 18 18   Height:      Weight:  95.9 kg  (211 lb 6.7 oz)    SpO2: 98% 100% 100% 100%   Physical Exam General:supine in bed Heart: tachy reg Lungs: no rales Abdomen: soft NT with 2 L instilled Extremities: no edema Dialysis Access: PD cath  Dialysis Orders: [facility] since 09/2015 7 days a week EDW 102; 5 exchanges fill 3 L(just increased but never accomplished) dwell 2 hr last fill 1 L (just started)- fills and drain 20 - 30 min no ESA, Fe  Additional Objective Labs: Basic Metabolic Panel:  Recent Labs Lab 03/17/16 0420 03/18/16 0438 03/19/16 0650  NA 141 136 136  K 4.5 4.0 4.0  CL 92* 88* 88*  CO2 25 27 26   GLUCOSE 108* 85 90  BUN 71* 71* 69*  CREATININE 22.36* 21.34* 20.66*  CALCIUM 10.6* 10.0 10.0  PHOS  --  12.1* 10.6*   Liver Function Tests:  Recent Labs Lab 03/17/16 0420 03/18/16 0438 03/19/16 0650  AST 31  --   --   ALT 28  --   --   ALKPHOS 90  --   --   BILITOT 0.6  --   --   PROT 7.7  --   --   ALBUMIN 3.6 3.1* 3.1*    Recent Labs Lab 03/17/16 0420  LIPASE 48  CBC:  Recent Labs Lab 03/17/16 0420 03/18/16 0438 03/19/16 0650  WBC 6.7 5.7 6.2  HGB 12.7 11.3* 12.0  HCT 40.0 36.9 37.8  MCV 86.0 87.9 87.1  PLT 349 306 302   Blood Culture    Component Value Date/Time   SDES FLUID PERITONEAL 03/17/2016 1526   SDES FLUID PERITONEAL 03/17/2016 1526   SPECREQUEST BOTTLES DRAWN AEROBIC AND ANAEROBIC 10CC 03/17/2016 1526   SPECREQUEST NONE 03/17/2016 1526   CULT NO GROWTH < 24 HOURS 03/17/2016 1526   REPTSTATUS PENDING 03/17/2016 1526   REPTSTATUS 03/17/2016 FINAL 03/17/2016 1526    Medications:   . allopurinol  300 mg Oral Daily  . dialysis solution 1.5% low-MG/low-CA   Intraperitoneal Q24H  . gentamicin cream  1 application Topical Daily  . heparin  5,000 Units Subcutaneous Q8H  . hydrocortisone cream   Topical TID  . hydroxychloroquine  200 mg Oral BID  . multivitamin  1 tablet Oral QHS  . mycophenolate  1,000 mg Oral BID  . predniSONE  5 mg Oral Daily  .  sevelamer carbonate  3,200 mg Oral TID WC  . sevelamer carbonate  800-1,600 mg Oral With snacks  . sucralfate  1 g Oral TID WC & HS

## 2016-03-19 NOTE — Progress Notes (Signed)
[facility] Progress Note  Patient: [name] [name] [id number]   PCP: Garret Reddish, MD DOB: 04-05-1981   DOA: 03/17/2016   DOS: 03/19/2016   Date of Service: the patient was seen and examined on 03/19/2016  Subjective: Continues to complain of nausea. One more episode of vomiting yesterday. No abdominal pain. Have constipation. Nutrition: Minimal oral intake  Brief hospital course: Patient was admitted on 03/17/2016, with complaint of nausea and vomiting as well as abdominal pain, was found to have worsening azotemia likely related poor compliance with PD. Currently further plan is continue with the changes recommended by nephrology with PD and monitor for improvement.  Assessment and Plan: 1. Intractable nausea and vomiting Likely from uremia. Possibility of pill-induced gastritis from doxycycline cannot be excluded. X-ray abdomen does not show any acute abnormality. Lipase level negative. Peritoneal dialysate fluid Gram stain does not suggest any evidence of SBP. Continue when necessary Zofran and use when necessary Compazine and Phenergan for refractory nausea.  2. ESRD secondary to SLE. On peritoneal dialysis. 7 days a week. Not able to get current maximum treatment dose. Presenting with nausea and vomiting which is likely from uremia. Nephrology recommended changes in the peritoneal dialysis regimen. We will continue to monitor. No evidence of peritonitis. Holding calcitriol at present due to high calcium. Appreciate nephrology input, patient scheduled for 1 session of hemodialysis tomorrow to see if that improves patient's intractable nausea and vomiting.  3. SLE. Follows up with rheumatologist Dr. Ardeen Jourdain Continue CellCept, Deltasone and Plaquenil-home dose. On outpatient Benlysta IV. Also continue prednisone. If patient is not able to take orally, we may have to supplement with IV equivalent dose.  4. Essential hypertension. Blood pressure on lower  side. Next and discontinue all antihypertensive medications at present.  5. Possible pill-induced gastritis.  Patient recently completed doxycycline course for 7 days. Her nausea and vomiting can also be pill induced gastritis from doxycycline. Continue Carafate.   6. History of TIA. Not on any antiplatelet medications at present. No focal deficit at present.  Pain management: When necessary Tylenol Activity: Currently independent Bowel regimen: last BM 03/19/2016 Diet: Clear liquid diet, due to nausea and vomiting DVT Prophylaxis: subcutaneous Heparin  Advance goals of care discussion: Full code  Family Communication: family was present at bedside, at the time of interview. The pt provided permission to discuss medical plan with the family. Opportunity was given to ask question and all questions were answered satisfactorily.   Disposition:  Discharge to home, pending improvement in oral intake Expected discharge date: 03/20/2016  Consultants: Nephrology Procedures: Peritoneal dialysis  Antibiotics: Anti-infectives    Start     Dose/Rate Route Frequency Ordered Stop   03/17/16 1000  hydroxychloroquine (PLAQUENIL) tablet 200 mg     200 mg Oral 2 times daily 03/17/16 0803          Intake/Output Summary (Last 24 hours) at 03/19/16 1657 Last data filed at 03/19/16 1304  Gross per 24 hour  Intake      0 ml  Output   2200 ml  Net  -2200 ml   Filed Weights   03/18/16 1037 03/18/16 2037  Weight: 94.3 kg (207 lb 14.3 oz) 95.9 kg (211 lb 6.7 oz)    Objective: Physical Exam: Filed Vitals:   03/18/16 1844 03/18/16 2037 03/19/16 0429 03/19/16 0943  BP: 112/78 95/62 103/74 118/64  Pulse: 98 118 108 90  Temp: 98.3 F (36.8 C) 98 F (36.7 C) 98.1 F (36.7 C) 97.4 F (36.3 C)  TempSrc: Oral Oral Oral Oral  Resp: 18 19 18 18   Height:      Weight:  95.9 kg (211 lb 6.7 oz)    SpO2: 98% 100% 100% 100%   General: Alert, Awake and Oriented to Time, Place and Person. Appear  in no distress Eyes: PERRL, Conjunctiva normal ENT: Oral Mucosa clear moist. Cardiovascular: S1 and S2 Present, no Murmur, Respiratory: Bilateral Air entry equal and Decreased, Clear to Auscultation, no Crackles, no wheezes Abdomen: Bowel Sound present, Soft and mild tenderness Extremities: no Pedal edema, no calf tenderness Neurologic: Grossly no focal neuro deficit.  Data Reviewed: CBC:  Recent Labs Lab 03/17/16 0420 03/18/16 0438 03/19/16 0650  WBC 6.7 5.7 6.2  HGB 12.7 11.3* 12.0  HCT 40.0 36.9 37.8  MCV 86.0 87.9 87.1  PLT 349 306 [id number]   Basic Metabolic Panel:  Recent Labs Lab 03/17/16 0420 03/18/16 0438 03/19/16 0650  NA 141 136 136  K 4.5 4.0 4.0  CL 92* 88* 88*  CO2 25 27 26   GLUCOSE 108* 85 90  BUN 71* 71* 69*  CREATININE 22.36* 21.34* 20.66*  CALCIUM 10.6* 10.0 10.0  MG  --   --  2.2  PHOS  --  12.1* 10.6*    Liver Function Tests:  Recent Labs Lab 03/17/16 0420 03/18/16 0438 03/19/16 0650  AST 31  --   --   ALT 28  --   --   ALKPHOS 90  --   --   BILITOT 0.6  --   --   PROT 7.7  --   --   ALBUMIN 3.6 3.1* 3.1*    Recent Labs Lab 03/17/16 0420  LIPASE 48   No results for input(s): AMMONIA in the last 168 hours. Coagulation Profile: No results for input(s): INR, PROTIME in the last 168 hours. Cardiac Enzymes: No results for input(s): CKTOTAL, CKMB, CKMBINDEX, TROPONINI in the last 168 hours. BNP (last 3 results) No results for input(s): PROBNP in the last 8760 hours.  CBG: No results for input(s): GLUCAP in the last 168 hours.  Studies: No results found.   Scheduled Meds: . allopurinol  300 mg Oral Daily  . gentamicin cream  1 application Topical Daily  . hydrocortisone cream   Topical TID  . hydroxychloroquine  200 mg Oral BID  . multivitamin  1 tablet Oral QHS  . mycophenolate  1,000 mg Oral BID  . polyethylene glycol  17 g Oral Daily  . predniSONE  5 mg Oral Daily  . sevelamer carbonate  3,200 mg Oral TID WC  . sevelamer  carbonate  800-1,600 mg Oral With snacks  . sucralfate  1 g Oral TID WC & HS   Continuous Infusions:  PRN Meds: acetaminophen **OR** acetaminophen, heparin, heparin, ondansetron **OR** ondansetron (ZOFRAN) IV, promethazine **OR** prochlorperazine **OR** promethazine  Time spent: 30 minutes  Author: Berle Mull, MD [facility] Pager: [phone] 03/19/2016 4:57 PM  If 7PM-7AM, please contact night-coverage at [email or web address], password [email or web address]

## 2016-03-19 NOTE — Consult Note (Signed)
Chief Complaint: Patient was seen in consultation today for perm HD catheter placement Chief Complaint  Patient presents with  . Emesis  . Abdominal Pain    Referring Physician(s): Dundarrach  Supervising Physician: [facility]  Patient Status: In-pt   History of Present Illness: [name] [name] is a 35 y.o. female with history of ESRD secondary to SLE and initiated on PD in January 2017. PMH also sig for cardiomyopathy, pulm HTN, HTN, gout, TIA and anemia. She recently presented to [facility] with persistent nausea, vomiting, and abd pain with subsequent worsening azotemia. Due to minimal decreases in creatinine despite PD adjustments request now received for perm HD cath placement.   Past Medical History  Diagnosis Date  . Unspecified essential hypertension   . Unspecified deficiency anemia   . Systemic lupus erythematosus ([place])   . Secondary cardiomyopathy, unspecified   . ERYTHEMATOSUS, LUPUS 08/07/2006  . CHF (congestive heart failure) ([place]) 5-6 yrs ago  hosp  . Chronic kidney disease   . Intestinal infection due to Clostridium difficile     Past Surgical History  Procedure Laterality Date  . Kidney biopsies      to determine lupus nephritis    Allergies: Lisinopril and Nsaids  Medications: Prior to Admission medications   Medication Sig Start Date End Date Taking? Authorizing Provider  allopurinol (ZYLOPRIM) 300 MG tablet TAKE 1 TABLET BY MOUTH DAILY 06/07/15  Yes Marin Olp, MD  calcitRIOL (ROCALTROL) 0.5 MCG capsule Take 0.5 mcg by mouth daily as needed. 02/25/16  Yes Historical Provider, MD  desonide (DESOWEN) 0.05 % ointment Apply 1 application topically daily.  09/15/14  Yes Historical Provider, MD  doxycycline (VIBRA-TABS) 100 MG tablet Take 1 tablet (100 mg total) by mouth 2 (two) times daily. 03/06/16  Yes Marin Olp, MD  gentamicin cream (GARAMYCIN) 0.1 % Apply 1 application topically 3 (three) times daily as needed. For cleaning exit  site. 12/28/15  Yes Historical Provider, MD  hydroxychloroquine (PLAQUENIL) 200 MG tablet TAKE 1 TABLET BY MOUTH TWICE A DAY 10/26/14  Yes Bruce Kendall Flack, MD  labetalol (NORMODYNE) 200 MG tablet Take 1 tablet (200 mg total) by mouth 3 (three) times daily. HOLD until follow-up with Dr Joelyn Oms Patient taking differently: Take 200 mg by mouth 2 (two) times daily. HOLD until follow-up with Dr Jolene Provost 02/24/16  Yes Ripudeep Krystal Eaton, MD  mycophenolate (CELLCEPT) 500 MG tablet Take 1,000 mg by mouth 2 (two) times daily.    Yes Historical Provider, MD  predniSONE (DELTASONE) 10 MG tablet Take 5 mg by mouth daily.  01/26/16  Yes Historical Provider, MD  RENVELA 800 MG tablet Take 1,600-3,200 mg by mouth as directed. 4 tablet by mouth three times daily with meals and 1-2 tablets with snacks. 01/26/16  Yes Historical Provider, MD  torsemide (DEMADEX) 100 MG tablet Take 100 mg by mouth every morning. 02/25/16  Yes Historical Provider, MD  triamcinolone lotion (KENALOG) 0.1 % Apply 1 application topically 2 (two) times daily as needed (dermatitis).  03/06/16  Yes Historical Provider, MD  valACYclovir (VALTREX) 1000 MG tablet Take 1,000 mg by mouth daily as needed (for flares.). Reported on 02/14/2016 10/29/13  Yes Historical Provider, MD     Family History  Problem Relation Age of Onset  . Lupus Mother   . Cancer Maternal Grandmother     unknown  . Prostate cancer Paternal Grandfather   . Diabetes Brother   . Diabetes      mat cousin  Social History   Social History  . Marital Status: Single    Spouse Name: N/A  . Number of Children: 0  . 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 Smoking status: Never Smoker   . Smokeless tobacco: Never Used  . Alcohol Use: 0.0 oz/week    0 Standard drinks or equivalent per week     Comment: socially 2x a month  . Drug Use: No  . Sexual Activity: No   Other Topics Concern  . None   Social History  Narrative   Lives with sister.       Works at [employer]: cooking, watch movies, time with friends      Review of Systems see above; currently denies fever, HA, CP, dyspnea, cough, back pain, or abnormal bleeding  Vital Signs: BP 102/64 mmHg  Pulse 76  Temp(Src) 98.2 F (36.8 C) (Oral)  Resp 18  Ht 5\' 7"  (1.702 m)  Wt 211 lb 6.7 oz (95.9 kg)  BMI 33.11 kg/m2  SpO2 100%  LMP 02/25/2016  Physical Exam awake/alert; chest- CTA bilat; heart- RRR; abd- soft, +BS, mild tenderness; PD cath in place; LE- no edema  Mallampati Score:     Imaging: Ct Abdomen Pelvis Wo Contrast  02/22/2016  CLINICAL DATA:  Recurrent nausea and vomiting for the last 6 days, upper abdominal pain last few days EXAM: CT ABDOMEN AND PELVIS WITHOUT CONTRAST TECHNIQUE: Multidetector CT imaging of the abdomen and pelvis was performed following the standard protocol without IV contrast. COMPARISON:  Images of the upper abdomen CT scan 06/28/2010 FINDINGS: Lower chest: Small hiatal hernia is noted. Lung bases are unremarkable. Hepatobiliary: The unenhanced liver shows no biliary ductal dilatation. No calcified gallstones are noted within gallbladder. Small amount of perihepatic ascites. Pancreas: Unenhanced pancreas is unremarkable. Spleen: Unenhanced spleen is unremarkable. Adrenals/Urinary Tract: No adrenal gland mass. Unenhanced kidneys are symmetrical in size. No hydronephrosis or hydroureter. No nephrolithiasis. No calcified ureteral calculi. No calcified calculi are noted within under distended urinary bladder. Stomach/Bowel: There is no gastric outlet obstruction. No small bowel obstruction. No thickened or dilated small bowel loops are noted. There is a left peritoneal dialysis catheter entering left upper abdominal wall with tip coiled within anterior pelvis. No pericecal inflammation. Normal appendix noted in axial image 66. No colitis or diverticulitis. No distal colonic obstruction.  Vascular/Lymphatic: No retroperitoneal or mesenteric adenopathy. No aortic aneurysm. Reproductive: The unenhanced uterus and ovaries are unremarkable. Small pelvic free fluid is noted. Other: There is small pelvic ascites. Small ascites is noted in right lower quadrant. Small perihepatic ascites. Small amount of free air in upper abdomen most likely from peritoneal dialysis. No mesenteric abscess. The there is no inguinal adenopathy. Musculoskeletal: Sagittal images of the spine shows no destructive bony lesions. No destructive bony lesions are noted within pelvis. IMPRESSION: 1. No nephrolithiasis.  No hydronephrosis or hydroureter. 2. A left anterior abdominal peritoneal dialysis catheter is noted. Small amount of free air in upper abdomen most likely from peritoneal dialysis. There is small perihepatic ascites. Small ascites is noted in right lower quadrant anteriorly. Small pelvic ascites. 3. No small bowel obstruction. No pericecal inflammation. Normal appendix. 4. No evidence of colitis or diverticulitis. 5. Unremarkable unenhanced uterus and ovaries. Electronically Signed   By: Lahoma Crocker M.D.   On: 02/22/2016 12:02   Dg Abd Portable  1v  03/18/2016  CLINICAL DATA:  Intractable nausea and vomiting. EXAM: PORTABLE ABDOMEN - 1 VIEW COMPARISON:  None. FINDINGS: The bowel gas pattern is normal. No radio-opaque calculi or other significant radiographic abnormality are seen. Peritoneal dialysis catheter noted. IMPRESSION: Negative. Electronically Signed   By: Kathreen Devoid   On: 03/18/2016 13:18    Labs:  CBC:  Recent Labs  03/09/16 1606 03/17/16 0420 03/18/16 0438 03/19/16 0650  WBC 7.5 6.7 5.7 6.2  HGB 11.8* 12.7 11.3* 12.0  HCT 36.8 40.0 36.9 37.8  PLT 262.0 349 306 302    COAGS: No results for input(s): INR, APTT in the last 8760 hours.  BMP:  Recent Labs  02/24/16 0507 03/09/16 1606 03/17/16 0420 03/18/16 0438 03/19/16 0650  NA 141 137 141 136 136  K 3.1* 4.5 4.5 4.0 4.0  CL  97* 91* 92* 88* 88*  CO2 25 26 25 27 26   GLUCOSE 93 81 108* 85 90  BUN 47* 69* 71* 71* 69*  CALCIUM 9.3 10.1 10.6* 10.0 10.0  CREATININE 16.50* 20.06* 22.36* 21.34* 20.66*  GFRNONAA 2*  --  2* 2* 2*  GFRAA 3*  --  2* 2* 2*    LIVER FUNCTION TESTS:  Recent Labs  02/20/16 1540 03/17/16 0420 03/18/16 0438 03/19/16 0650  BILITOT 0.5 0.6  --   --   AST 19 31  --   --   ALT 17 28  --   --   ALKPHOS 68 90  --   --   PROT 7.2 7.7  --   --   ALBUMIN 3.6 3.6 3.1* 3.1*    TUMOR MARKERS: No results for input(s): AFPTM, CEA, CA199, CHROMGRNA in the last 8760 hours.  Assessment and Plan: Pt with ESRD secondary to SLE, currently on PD but with little improvement in renal function, persistent nausea, creatinine currently 20.66 (22 on admission); request received for HD (perm) cath placement; AF; WBC nl; perit fluid cx's neg to date; details/risks of procedure , incl but not limited to, internal bleeding, infection, injury to adjacent structures, d/w pt with her understanding and consent. Procedure tent planned for 5/29 as pt is scheduled for HD session.   Thank you for this interesting consult.  I greatly enjoyed meeting [name] [name] and look forward to participating in their care.  A copy of this report was sent to the requesting provider on this date.  Electronically Signed: D. Rowe Robert 03/19/2016, 6:20 PM   I spent a total of 30 minutes  in face to face in clinical consultation, greater than 50% of which was counseling/coordinating care for hemodialysis cath placement

## 2016-03-20 ENCOUNTER — Inpatient Hospital Stay (HOSPITAL_COMMUNITY): Payer: BLUE CROSS/BLUE SHIELD

## 2016-03-20 LAB — PROTIME-INR
INR: 1.34 (ref 0.00–1.49)
Prothrombin Time: 16.7 seconds — ABNORMAL HIGH (ref 11.6–15.2)

## 2016-03-20 LAB — RENAL FUNCTION PANEL
ALBUMIN: 3 g/dL — AB (ref 3.5–5.0)
ANION GAP: 23 — AB (ref 5–15)
BUN: 85 mg/dL — ABNORMAL HIGH (ref 6–20)
CALCIUM: 9.4 mg/dL (ref 8.9–10.3)
CO2: 21 mmol/L — AB (ref 22–32)
CREATININE: 22.81 mg/dL — AB (ref 0.44–1.00)
Chloride: 91 mmol/L — ABNORMAL LOW (ref 101–111)
GFR, EST AFRICAN AMERICAN: 2 mL/min — AB (ref >60)
GFR, EST NON AFRICAN AMERICAN: 2 mL/min — AB (ref >60)
Glucose, Bld: 79 mg/dL (ref 65–99)
PHOSPHORUS: 14.3 mg/dL — AB (ref 2.5–4.6)
Potassium: 4.7 mmol/L (ref 3.5–5.1)
SODIUM: 135 mmol/L (ref 135–145)

## 2016-03-20 LAB — CBC
HCT: 35.5 % — ABNORMAL LOW (ref 36.0–46.0)
Hemoglobin: 11.3 g/dL — ABNORMAL LOW (ref 12.0–15.0)
MCH: 27.7 pg (ref 26.0–34.0)
MCHC: 31.8 g/dL (ref 30.0–36.0)
MCV: 87 fL (ref 78.0–100.0)
Platelets: 288 10*3/uL (ref 150–400)
RBC: 4.08 MIL/uL (ref 3.87–5.11)
RDW: 15.1 % (ref 11.5–15.5)
WBC: 6.9 10*3/uL (ref 4.0–10.5)

## 2016-03-20 LAB — LACTIC ACID, PLASMA: LACTIC ACID, VENOUS: 0.9 mmol/L (ref 0.5–2.0)

## 2016-03-20 LAB — SURGICAL PCR SCREEN
MRSA, PCR: NEGATIVE
STAPHYLOCOCCUS AUREUS: NEGATIVE

## 2016-03-20 LAB — HEPATITIS B SURFACE ANTIGEN: Hepatitis B Surface Ag: NEGATIVE

## 2016-03-20 MED ORDER — PENTAFLUOROPROP-TETRAFLUOROETH EX AERO
1.0000 | INHALATION_SPRAY | CUTANEOUS | Status: DC | PRN
Start: 2016-03-20 — End: 2016-03-20

## 2016-03-20 MED ORDER — MIDAZOLAM HCL 2 MG/2ML IJ SOLN
INTRAMUSCULAR | Status: AC | PRN
  Administered 2016-03-20 (×2): 1 mg via INTRAVENOUS

## 2016-03-20 MED ORDER — FENTANYL CITRATE (PF) 100 MCG/2ML IJ SOLN
INTRAMUSCULAR | Status: AC
  Filled 2016-03-20: qty 4

## 2016-03-20 MED ORDER — HEPARIN SODIUM (PORCINE) 1000 UNIT/ML DIALYSIS: 1000.0000 [IU] | INTRAMUSCULAR | Status: DC | PRN

## 2016-03-20 MED ORDER — SODIUM CHLORIDE 0.9 % IV SOLN
100.0000 mL | INTRAVENOUS | Status: DC | PRN
Start: 2016-03-20 — End: 2016-03-20

## 2016-03-20 MED ORDER — LIDOCAINE HCL 1 % IJ SOLN
INTRAMUSCULAR | Status: AC
  Filled 2016-03-20: qty 20

## 2016-03-20 MED ORDER — ALTEPLASE 2 MG IJ SOLR: 2.0000 mg | Freq: Once | INTRAMUSCULAR | Status: DC | PRN

## 2016-03-20 MED ORDER — CEFAZOLIN SODIUM-DEXTROSE 2-4 GM/100ML-% IV SOLN
INTRAVENOUS | Status: AC
  Filled 2016-03-20: qty 100

## 2016-03-20 MED ORDER — FENTANYL CITRATE (PF) 100 MCG/2ML IJ SOLN
INTRAMUSCULAR | Status: AC | PRN
  Administered 2016-03-20: 50 ug via INTRAVENOUS

## 2016-03-20 MED ORDER — SODIUM CHLORIDE 0.9 % IV SOLN
INTRAVENOUS | Status: AC | PRN
  Administered 2016-03-20: 10 mL/h via INTRAVENOUS

## 2016-03-20 MED ORDER — MIDAZOLAM HCL 2 MG/2ML IJ SOLN
INTRAMUSCULAR | Status: AC
  Filled 2016-03-20: qty 4

## 2016-03-20 MED ORDER — SODIUM CHLORIDE 0.9 % IV SOLN: 100.0000 mL | INTRAVENOUS | Status: DC | PRN

## 2016-03-20 MED ORDER — LIDOCAINE HCL (PF) 1 % IJ SOLN: 5.0000 mL | INTRAMUSCULAR | Status: DC | PRN

## 2016-03-20 MED ORDER — HEPARIN SODIUM (PORCINE) 1000 UNIT/ML IJ SOLN
INTRAMUSCULAR | Status: AC
  Filled 2016-03-20: qty 1

## 2016-03-20 MED ORDER — LIDOCAINE-PRILOCAINE 2.5-2.5 % EX CREA: 1.0000 "application " | TOPICAL_CREAM | CUTANEOUS | Status: DC | PRN

## 2016-03-20 NOTE — Sedation Documentation (Signed)
Patient is resting comfortably. Pt reports some discomfort, additional medication given as discussed with Dr. Laurence Ferrari

## 2016-03-20 NOTE — Procedures (Signed)
Interventional Radiology Procedure Note  Procedure:  Placement of a Right IJ approach tunneled HD catheter (19 cm tip-to-cuff tunneled HD catheter).  Complications: None  Estimated Blood Loss: None  Recommendations: - Mya use immediately - Routine line care  Signed,  Criselda Peaches, MD

## 2016-03-20 NOTE — Sedation Documentation (Signed)
Patient denies pain and is resting comfortably.  

## 2016-03-20 NOTE — Progress Notes (Signed)
[facility] Progress Note  Assessment/Plan: 1. Nausea and abdominal pain- recurrent admission - initially had abdominal pain with fills now nausea with fills but not at rest or when large volume not instilled. BUT trying to do more PD to achieve clearance so is making nausea worse. Cell count insignificant.We are going on theory that nausea is due to uremia- see plan below  2. ESRD secondary to SLE- On CCPD 7 days a week- not getting to current max Rx dose. Cr only decreased from 22 to 20 BUN essentially unchanged in the past 24 hours. She had 3 daytime 2.5 L exchanges yesterday and partial CCPD when labs drawn this am. Essentially 20 liters of PD with minimal change in BUN and creatinine ?- change day exchanges to longer on the off chance she is a low transporter? Pt agrees to a trial of HD to bring numbers down to see if nausea resolves If it doesn't then it is not uremia but if it does then it is; HD today after catheter placement - will pursue outpt HD options - she would prefer first shift at [facility]; last CCAP/CCPD Saturday 3. BP/volume vol ok - tachy today - rate 100 no BP meds 4. Anemia - hgb 12 stable off ESA/Fe 5. Metabolic bone disease - Mildly hypercalcemic P high in outpt setting last Ca/P 10.4/9 iPTH 558 - on renvela - dialysis staff trying to enroll in sensipar program; calcitriol held due to hypercalcemia - renvela ordered for binder P 14.3 - hasn't been taking per [facility] has been CL or NPO  Will use 2 Ca bath to lower Ca and then can resume calcitriol 6. Nutrition - needs low P diet, currently on CL- would start on renal diet after catheter placed 7. SLE - on cellcept/pred/plaquenil  Myriam Jacobson, PA-C [facility] Beeper [phone] 03/20/2016,9:12 AM  LOS: 3 days   Pt seen, examined and agree w A/P as above.  Kelly Splinter MD [facility] pager [phone]    cell [phone] 03/20/2016, 2:33 PM    Subjective:   No c/o - no abd  pain   Objective Filed Vitals:   03/19/16 1807 03/19/16 2004 03/20/16 0411 03/20/16 0747  BP: 102/64 95/56 109/64 119/92  Pulse: 76 99 96 106  Temp: 98.2 F (36.8 C) 98 F (36.7 C) 98.2 F (36.8 C) 98.2 F (36.8 C)  TempSrc: Oral Oral Oral Oral  Resp: 18 18 19 18   Height:      Weight:      SpO2: 100% 91% 100% 100%   Physical Exam General: NAD sitting in chair Heart: tachy reg ~100 Lungs: no rales Abdomen: soft NT Extremities: no LE edema Dialysis Access:  PD cath  Dialysis Orders: pre CCPD - plan trial of HD Additional Objective Labs: Basic Metabolic Panel:  Recent Labs Lab 03/18/16 0438 03/19/16 0650 03/20/16 0517  NA 136 136 135  K 4.0 4.0 4.7  CL 88* 88* 91*  CO2 27 26 21*  GLUCOSE 85 90 79  BUN 71* 69* 85*  CREATININE 21.34* 20.66* 22.81*  CALCIUM 10.0 10.0 9.4  PHOS 12.1* 10.6* 14.3*   Liver Function Tests:  Recent Labs Lab 03/17/16 0420 03/18/16 0438 03/19/16 0650 03/20/16 0517  AST 31  --   --   --   ALT 28  --   --   --   ALKPHOS 90  --   --   --   BILITOT 0.6  --   --   --  PROT 7.7  --   --   --   ALBUMIN 3.6 3.1* 3.1* 3.0*    Recent Labs Lab 03/17/16 0420  LIPASE 48   CBC:  Recent Labs Lab 03/17/16 0420 03/18/16 0438 03/19/16 0650  WBC 6.7 5.7 6.2  HGB 12.7 11.3* 12.0  HCT 40.0 36.9 37.8  MCV 86.0 87.9 87.1  PLT 349 306 302   Blood Culture    Component Value Date/Time   SDES FLUID PERITONEAL 03/17/2016 1526   SDES FLUID PERITONEAL 03/17/2016 1526   SPECREQUEST BOTTLES DRAWN AEROBIC AND ANAEROBIC 10CC 03/17/2016 1526   SPECREQUEST NONE 03/17/2016 1526   CULT NO GROWTH 2 DAYS 03/17/2016 1526   REPTSTATUS PENDING 03/17/2016 1526   REPTSTATUS 03/17/2016 FINAL 03/17/2016 1526    Studies/Results: Dg Abd Portable 1v  03/18/2016  CLINICAL DATA:  Intractable nausea and vomiting. EXAM: PORTABLE ABDOMEN - 1 VIEW COMPARISON:  None. FINDINGS: The bowel gas pattern is normal. No radio-opaque calculi or other significant  radiographic abnormality are seen. Peritoneal dialysis catheter noted. IMPRESSION: Negative. Electronically Signed   By: Kathreen Devoid   On: 03/18/2016 13:18   Medications:   . allopurinol  300 mg Oral Daily  .  ceFAZolin (ANCEF) IV  2 g Intravenous to XRAY  . gentamicin cream  1 application Topical Daily  . hydrocortisone cream   Topical TID  . hydroxychloroquine  200 mg Oral BID  . multivitamin  1 tablet Oral QHS  . mycophenolate  1,000 mg Oral BID  . polyethylene glycol  17 g Oral Daily  . predniSONE  5 mg Oral Daily  . sevelamer carbonate  3,200 mg Oral TID WC  . sevelamer carbonate  800-1,600 mg Oral With snacks  . sucralfate  1 g Oral TID WC & HS

## 2016-03-20 NOTE — Progress Notes (Signed)
[facility] Progress Note  Patient: [name] [name] JSE:[id number]   PCP: Garret Reddish, MD DOB: 1981-07-02   DOA: 03/17/2016   DOS: 03/20/2016   Date of Service: the patient was seen and examined on 03/20/2016  Subjective: Nausea is resolved. Patient had a bowel movement yesterday. No abdominal pain. Nutrition: Minimal oral intake  Brief hospital course: Patient was admitted on 03/17/2016, with complaint of nausea and vomiting as well as abdominal pain, was found to have worsening azotemia likely related poor compliance with PD. Currently further plan is continue with dialysis per nephrology and monitor for improvement.  Assessment and Plan: 1. Intractable nausea and vomiting Likely from uremia. Possibility of pill-induced gastritis from doxycycline cannot be excluded. X-ray abdomen does not show any acute abnormality. Lipase level negative. Peritoneal dialysate fluid Gram stain does not suggest any evidence of SBP. Continue when necessary Zofran and use when necessary Compazine and Phenergan for refractory nausea. We will advance the diet to soft diet today since the patient's nausea is resolved.  2. ESRD secondary to SLE. On peritoneal dialysis. 7 days a week. Not able to get current maximum treatment dose. Presenting with nausea and vomiting which is likely from uremia. Nephrology recommended changes in the peritoneal dialysis regimen. We will continue to monitor. No evidence of peritonitis. Holding calcitriol at present due to high calcium. Appreciate nephrology input, patient scheduled for 1 session of hemodialysis today to see if that improves patient's intractable nausea and vomiting.  3. SLE. Follows up with rheumatologist Dr. Ardeen Jourdain Continue CellCept, Deltasone and Plaquenil-home dose. On outpatient Benlysta IV. Also continue prednisone. If patient is not able to take orally, we may have to supplement with IV equivalent dose.  4. Essential  hypertension. Blood pressure on lower side. Next and discontinue all antihypertensive medications at present.  5. Possible pill-induced gastritis.  Patient recently completed doxycycline course for 7 days. Her nausea and vomiting can also be pill induced gastritis from doxycycline. Continue Carafate.   6. History of TIA. Not on any antiplatelet medications at present. No focal deficit at present.  Pain management: When necessary Tylenol Activity: Currently independent Bowel regimen: last BM 03/19/2016 Diet: Clear liquid diet, due to nausea and vomiting DVT Prophylaxis: subcutaneous Heparin  Advance goals of care discussion: Full code  Family Communication: family was present at bedside, at the time of interview. The pt provided permission to discuss medical plan with the family. Opportunity was given to ask question and all questions were answered satisfactorily.   Disposition:  Discharge to home, pending improvement in oral intake Expected discharge date: 03/22/2016  Consultants: Nephrology Procedures: Peritoneal dialysis  Antibiotics: Anti-infectives    Start     Dose/Rate Route Frequency Ordered Stop   03/20/16 1028  ceFAZolin (ANCEF) 2-4 GM/100ML-% IVPB    Comments:  Babs Bertin   : cabinet override      03/20/16 1028 03/20/16 2244   03/20/16 1000  ceFAZolin (ANCEF) IVPB 2g/100 mL premix     2 g 200 mL/hr over 30 Minutes Intravenous To Radiology 03/19/16 1839 03/20/16 1055   03/17/16 1000  hydroxychloroquine (PLAQUENIL) tablet 200 mg     200 mg Oral 2 times daily 03/17/16 0803          Intake/Output Summary (Last 24 hours) at 03/20/16 1831 Last data filed at 03/20/16 1819  Gross per 24 hour  Intake      0 ml  Output      0 ml  Net  0 ml   Filed Weights   03/18/16 2037 03/20/16 1350 03/20/16 1819  Weight: 95.9 kg (211 lb 6.7 oz) 93.6 kg (206 lb 5.6 oz) 93.2 kg (205 lb 7.5 oz)    Objective: Physical Exam: Filed Vitals:   03/20/16 1700 03/20/16 1730  03/20/16 1800 03/20/16 1819  BP: 96/76 108/74 103/80 130/83  Pulse: 110 112 112 95  Temp:    97.7 F (36.5 C)  TempSrc:    Oral  Resp:    18  Height:      Weight:    93.2 kg (205 lb 7.5 oz)  SpO2:    100%   General: Alert, Awake and Oriented to Time, Place and Person. Appear in no distress Eyes: PERRL, Conjunctiva normal ENT: Oral Mucosa clear moist. Cardiovascular: S1 and S2 Present, no Murmur, Respiratory: Bilateral Air entry equal and Decreased, Clear to Auscultation, no Crackles, no wheezes Abdomen: Bowel Sound present, Soft and mild tenderness Extremities: no Pedal edema, no calf tenderness Neurologic: Grossly no focal neuro deficit.  Data Reviewed: CBC:  Recent Labs Lab 03/17/16 0420 03/18/16 0438 03/19/16 0650 03/20/16 1404  WBC 6.7 5.7 6.2 6.9  HGB 12.7 11.3* 12.0 11.3*  HCT 40.0 36.9 37.8 35.5*  MCV 86.0 87.9 87.1 87.0  PLT 349 306 302 532   Basic Metabolic Panel:  Recent Labs Lab 03/17/16 0420 03/18/16 0438 03/19/16 0650 03/20/16 0517  NA 141 136 136 135  K 4.5 4.0 4.0 4.7  CL 92* 88* 88* 91*  CO2 '25 27 26 '$ 21*  GLUCOSE 108* 85 90 79  BUN 71* 71* 69* 85*  CREATININE 22.36* 21.34* 20.66* 22.81*  CALCIUM 10.6* 10.0 10.0 9.4  MG  --   --  2.2  --   PHOS  --  12.1* 10.6* 14.3*    Liver Function Tests:  Recent Labs Lab 03/17/16 0420 03/18/16 0438 03/19/16 0650 03/20/16 0517  AST 31  --   --   --   ALT 28  --   --   --   ALKPHOS 90  --   --   --   BILITOT 0.6  --   --   --   PROT 7.7  --   --   --   ALBUMIN 3.6 3.1* 3.1* 3.0*    Recent Labs Lab 03/17/16 0420  LIPASE 48   No results for input(s): AMMONIA in the last 168 hours. Coagulation Profile:  Recent Labs Lab 03/20/16 0517  INR 1.34   Cardiac Enzymes: No results for input(s): CKTOTAL, CKMB, CKMBINDEX, TROPONINI in the last 168 hours. BNP (last 3 results) No results for input(s): PROBNP in the last 8760 hours.  CBG: No results for input(s): GLUCAP in the last 168  hours.  Studies: Ir Fluoro Guide Cv Line Right  03/20/2016  INDICATION: 35 year old female with end-stage renal disease on peritoneal dialysis. However, reason sessions have been insufficient a decreasing creatinine and potassium. Therefore, a trial of hemodialysis is indicated and the patient requires hemodialysis access. EXAM: TUNNELED CENTRAL VENOUS HEMODIALYSIS CATHETER PLACEMENT WITH ULTRASOUND AND FLUOROSCOPIC GUIDANCE MEDICATIONS: 2 g Ancef . The antibiotic was given in an appropriate time interval prior to skin puncture. ANESTHESIA/SEDATION: Moderate (conscious) sedation was employed during this procedure. A total of Versed 2 mg and Fentanyl 50 mcg was administered intravenously. Moderate Sedation Time: 20 minutes. The patient's level of consciousness and vital signs were monitored continuously by radiology nursing throughout the procedure under my direct supervision. FLUOROSCOPY TIME:  Fluoroscopy Time: 0 minutes 12  seconds (1 mGy). COMPLICATIONS: None immediate. Estimated blood loss: None PROCEDURE: Informed written consent was obtained from the patient after a discussion of the risks, benefits, and alternatives to treatment. Questions regarding the procedure were encouraged and answered. The right neck and chest were prepped with chlorhexidine in a sterile fashion, and a sterile drape was applied covering the operative field. Maximum barrier sterile technique with sterile gowns and gloves were used for the procedure. A timeout was performed prior to the initiation of the procedure. After creating a small venotomy incision, a micropuncture kit was utilized to access the right internal jugular vein under direct, real-time ultrasound guidance after the overlying soft tissues were anesthetized with 1% lidocaine with epinephrine. Ultrasound image documentation was performed. The microwire was kinked to measure appropriate catheter length. A stiff Glidewire was advanced to the level of the IVC and the  micropuncture sheath was exchanged for a peel-away sheath. A Palindrome tunneled hemodialysis catheter measuring 19 cm from tip to cuff was tunneled in a retrograde fashion from the anterior chest wall to the venotomy incision. The catheter was then placed through the peel-away sheath with tips ultimately positioned within the superior aspect of the right atrium. Final catheter positioning was confirmed and documented with a spot radiographic image. The catheter aspirates and flushes normally. The catheter was flushed with appropriate volume heparin dwells. The catheter exit site was secured with a 0-Prolene retention suture. The venotomy incision was closed with an interrupted 4-0 Vicryl, Dermabond and Steri-strips. Dressings were applied. The patient tolerated the procedure well without immediate post procedural complication. IMPRESSION: Successful placement of 19 cm tip to cuff tunneled hemodialysis catheter via the right internal jugular vein with tips terminating within the superior aspect of the right atrium. The catheter is ready for immediate use. Electronically Signed   By: Jacqulynn Cadet M.D.   On: 03/20/2016 11:31   Ir US Guide Vasc Access Right  03/20/2016  INDICATION: 35 year old female with end-stage renal disease on peritoneal dialysis. However, reason sessions have been insufficient a decreasing creatinine and potassium. Therefore, a trial of hemodialysis is indicated and the patient requires hemodialysis access. EXAM: TUNNELED CENTRAL VENOUS HEMODIALYSIS CATHETER PLACEMENT WITH ULTRASOUND AND FLUOROSCOPIC GUIDANCE MEDICATIONS: 2 g Ancef . The antibiotic was given in an appropriate time interval prior to skin puncture. ANESTHESIA/SEDATION: Moderate (conscious) sedation was employed during this procedure. A total of Versed 2 mg and Fentanyl 50 mcg was administered intravenously. Moderate Sedation Time: 20 minutes. The patient's level of consciousness and vital signs were monitored continuously  by radiology nursing throughout the procedure under my direct supervision. FLUOROSCOPY TIME:  Fluoroscopy Time: 0 minutes 12 seconds (1 mGy). COMPLICATIONS: None immediate. Estimated blood loss: None PROCEDURE: Informed written consent was obtained from the patient after a discussion of the risks, benefits, and alternatives to treatment. Questions regarding the procedure were encouraged and answered. The right neck and chest were prepped with chlorhexidine in a sterile fashion, and a sterile drape was applied covering the operative field. Maximum barrier sterile technique with sterile gowns and gloves were used for the procedure. A timeout was performed prior to the initiation of the procedure. After creating a small venotomy incision, a micropuncture kit was utilized to access the right internal jugular vein under direct, real-time ultrasound guidance after the overlying soft tissues were anesthetized with 1% lidocaine with epinephrine. Ultrasound image documentation was performed. The microwire was kinked to measure appropriate catheter length. A stiff Glidewire was advanced to the level of the IVC and the  micropuncture sheath was exchanged for a peel-away sheath. A Palindrome tunneled hemodialysis catheter measuring 19 cm from tip to cuff was tunneled in a retrograde fashion from the anterior chest wall to the venotomy incision. The catheter was then placed through the peel-away sheath with tips ultimately positioned within the superior aspect of the right atrium. Final catheter positioning was confirmed and documented with a spot radiographic image. The catheter aspirates and flushes normally. The catheter was flushed with appropriate volume heparin dwells. The catheter exit site was secured with a 0-Prolene retention suture. The venotomy incision was closed with an interrupted 4-0 Vicryl, Dermabond and Steri-strips. Dressings were applied. The patient tolerated the procedure well without immediate post  procedural complication. IMPRESSION: Successful placement of 19 cm tip to cuff tunneled hemodialysis catheter via the right internal jugular vein with tips terminating within the superior aspect of the right atrium. The catheter is ready for immediate use. Electronically Signed   By: Jacqulynn Cadet M.D.   On: 03/20/2016 11:31     Scheduled Meds: . allopurinol  300 mg Oral Daily  . ceFAZolin      . fentaNYL      . gentamicin cream  1 application Topical Daily  . heparin      . hydrocortisone cream   Topical TID  . hydroxychloroquine  200 mg Oral BID  . lidocaine      . midazolam      . multivitamin  1 tablet Oral QHS  . mycophenolate  1,000 mg Oral BID  . polyethylene glycol  17 g Oral Daily  . predniSONE  5 mg Oral Daily  . sevelamer carbonate  3,200 mg Oral TID WC  . sevelamer carbonate  800-1,600 mg Oral With snacks  . sucralfate  1 g Oral TID WC & HS   Continuous Infusions:  PRN Meds: acetaminophen **OR** acetaminophen, heparin, heparin, ondansetron **OR** ondansetron (ZOFRAN) IV, promethazine **OR** prochlorperazine **OR** promethazine  Time spent: 30 minutes  Author: Berle Mull, MD [facility] Pager: [phone] 03/20/2016 6:31 PM  If 7PM-7AM, please contact night-coverage at [email or web address], password [facility]

## 2016-03-21 ENCOUNTER — Inpatient Hospital Stay (HOSPITAL_COMMUNITY): Payer: BLUE CROSS/BLUE SHIELD

## 2016-03-21 DIAGNOSIS — I272 Other secondary pulmonary hypertension: Secondary | ICD-10-CM

## 2016-03-21 DIAGNOSIS — I429 Cardiomyopathy, unspecified: Secondary | ICD-10-CM

## 2016-03-21 LAB — COMPREHENSIVE METABOLIC PANEL
ALT: 21 U/L (ref 14–54)
ANION GAP: 15 (ref 5–15)
AST: 27 U/L (ref 15–41)
Albumin: 2.8 g/dL — ABNORMAL LOW (ref 3.5–5.0)
Alkaline Phosphatase: 83 U/L (ref 38–126)
BUN: 36 mg/dL — ABNORMAL HIGH (ref 6–20)
CHLORIDE: 92 mmol/L — AB (ref 101–111)
CO2: 27 mmol/L (ref 22–32)
CREATININE: 12.92 mg/dL — AB (ref 0.44–1.00)
Calcium: 9.2 mg/dL (ref 8.9–10.3)
GFR, EST AFRICAN AMERICAN: 4 mL/min — AB (ref >60)
GFR, EST NON AFRICAN AMERICAN: 3 mL/min — AB (ref >60)
Glucose, Bld: 83 mg/dL (ref 65–99)
POTASSIUM: 4.3 mmol/L (ref 3.5–5.1)
SODIUM: 134 mmol/L — AB (ref 135–145)
Total Bilirubin: 0.5 mg/dL (ref 0.3–1.2)
Total Protein: 6 g/dL — ABNORMAL LOW (ref 6.5–8.1)

## 2016-03-21 LAB — LACTIC ACID, PLASMA: LACTIC ACID, VENOUS: 1.1 mmol/L (ref 0.5–2.0)

## 2016-03-21 LAB — CBC WITH DIFFERENTIAL/PLATELET
BASOS ABS: 0 10*3/uL (ref 0.0–0.1)
BASOS PCT: 0 %
EOS ABS: 0 10*3/uL (ref 0.0–0.7)
Eosinophils Relative: 0 %
HCT: 34.6 % — ABNORMAL LOW (ref 36.0–46.0)
HEMOGLOBIN: 10.7 g/dL — AB (ref 12.0–15.0)
Lymphocytes Relative: 15 %
Lymphs Abs: 0.9 10*3/uL (ref 0.7–4.0)
MCH: 27 pg (ref 26.0–34.0)
MCHC: 30.9 g/dL (ref 30.0–36.0)
MCV: 87.4 fL (ref 78.0–100.0)
MONOS PCT: 21 %
Monocytes Absolute: 1.2 10*3/uL — ABNORMAL HIGH (ref 0.1–1.0)
NEUTROS ABS: 3.7 10*3/uL (ref 1.7–7.7)
NEUTROS PCT: 64 %
PLATELETS: 167 10*3/uL (ref 150–400)
RBC: 3.96 MIL/uL (ref 3.87–5.11)
RDW: 14.9 % (ref 11.5–15.5)
WBC: 5.8 10*3/uL (ref 4.0–10.5)

## 2016-03-21 LAB — LIPASE, BLOOD: LIPASE: 37 U/L (ref 11–51)

## 2016-03-21 LAB — PATHOLOGIST SMEAR REVIEW

## 2016-03-21 MED ORDER — SUCRALFATE 1 GM/10ML PO SUSP: 1.0000 g | Freq: Three times a day (TID) | ORAL | Status: DC

## 2016-03-21 MED ORDER — POLYETHYLENE GLYCOL 3350 17 G PO PACK: 17.0000 g | PACK | Freq: Every day | ORAL | Status: DC

## 2016-03-21 MED ORDER — ONDANSETRON 4 MG PO TBDP: 4.0000 mg | ORAL_TABLET | Freq: Three times a day (TID) | ORAL | Status: DC | PRN

## 2016-03-21 NOTE — Progress Notes (Signed)
[facility] Progress Note  Assessment/Plan: 1. Nausea and abdominal pain- recurrent admission; had some problem with nausea this am when breakfast came - better now though not entirely gone. Not totally clear of etiology; on carafate - need to limit use to 1 month or less due to Al content 2. ESRD secondary to SLE- failed PD for now; just was not clearing; Cr went from 22 to 12 after first HD treatment - CLIP process started - she has a spot at [facility] for MWF 720; discussed that she will likely need to get a AVF if she continues on HD- she understands. HD orders written for Wed in the event she is not d/c today 3. BP/volume kept even on HD Monday - tachy today - rate 100 no BP meds 4. Anemia - hgb 12 > 10.7  Watch off ESA/Fe 5. Metabolic bone disease - Ca 9.2 continuel use 2 Ca bath to lower Ca and then can resume calcitriol; working on getting enrolled in sensipar program; continue binders- this will be managed further in the outpt setting 6. Nutrition changed to renal diet  7. SLE - on cellcept/pred/plaquenil 8. Disp - ok with renal for d/c today to start at her dialysis ([facility]) tomorrow- TOMORROW only she would need to be there at 645 am to sign papers  Myriam Jacobson, PA-C [place] [phone] 03/21/2016,10:06 AM  LOS: 4 days   Pt seen, examined and agree w A/P as above.  Kelly Splinter MD [facility] pager [phone]    cell [phone] 03/21/2016, 3:36 PM    Subjective:   No problems with HD yesterday- some nausea this am  Objective Filed Vitals:   03/20/16 1848 03/20/16 2007 03/21/16 0526 03/21/16 0904  BP: 99/61 106/68 93/62 116/82  Pulse: 83 123 102 108  Temp: 97.5 F (36.4 C) 98.9 F (37.2 C) 98.3 F (36.8 C) 98.4 F (36.9 C)  TempSrc: Oral Oral Oral Oral  Resp: '17 18 19 18  '$ Height:      Weight:  92.8 kg (204 lb 9.4 oz)    SpO2: 100% 100% 100% 100%   Physical Exam General:NAD resting in bed Heart: tachy reg Lungs:  no rales Abdomen: soft NT Extremities: no sig edema Dialysis Access: right IJ  Dialysis Orders: prev PD  Additional Objective Labs: Basic Metabolic Panel:  Recent Labs Lab 03/18/16 0438 03/19/16 0650 03/20/16 0517 03/21/16 0553  NA 136 136 135 134*  K 4.0 4.0 4.7 4.3  CL 88* 88* 91* 92*  CO2 27 26 21* 27  GLUCOSE 85 90 79 83  BUN 71* 69* 85* 36*  CREATININE 21.34* 20.66* 22.81* 12.92*  CALCIUM 10.0 10.0 9.4 9.2  PHOS 12.1* 10.6* 14.3*  --    Liver Function Tests:  Recent Labs Lab 03/17/16 0420  03/19/16 0650 03/20/16 0517 03/21/16 0553  AST 31  --   --   --  27  ALT 28  --   --   --  21  ALKPHOS 90  --   --   --  83  BILITOT 0.6  --   --   --  0.5  PROT 7.7  --   --   --  6.0*  ALBUMIN 3.6  < > 3.1* 3.0* 2.8*  < > = values in this interval not displayed.  Recent Labs Lab 03/17/16 0420  LIPASE 48   CBC:  Recent Labs Lab 03/17/16 0420 03/18/16 0438 03/19/16 0650 03/20/16 1404 03/21/16 0553  WBC  6.7 5.7 6.2 6.9 5.8  NEUTROABS  --   --   --   --  3.7  HGB 12.7 11.3* 12.0 11.3* 10.7*  HCT 40.0 36.9 37.8 35.5* 34.6*  MCV 86.0 87.9 87.1 87.0 87.4  PLT 349 306 302 288 167    Studies/Results: Ir Fluoro Guide Cv Line Right  03/20/2016  INDICATION: 35 year old female with end-stage renal disease on peritoneal dialysis. However, reason sessions have been insufficient a decreasing creatinine and potassium. Therefore, a trial of hemodialysis is indicated and the patient requires hemodialysis access. EXAM: TUNNELED CENTRAL VENOUS HEMODIALYSIS CATHETER PLACEMENT WITH ULTRASOUND AND FLUOROSCOPIC GUIDANCE MEDICATIONS: 2 g Ancef . The antibiotic was given in an appropriate time interval prior to skin puncture. ANESTHESIA/SEDATION: Moderate (conscious) sedation was employed during this procedure. A total of Versed 2 mg and Fentanyl 50 mcg was administered intravenously. Moderate Sedation Time: 20 minutes. The patient's level of consciousness and vital signs were  monitored continuously by radiology nursing throughout the procedure under my direct supervision. FLUOROSCOPY TIME:  Fluoroscopy Time: 0 minutes 12 seconds (1 mGy). COMPLICATIONS: None immediate. Estimated blood loss: None PROCEDURE: Informed written consent was obtained from the patient after a discussion of the risks, benefits, and alternatives to treatment. Questions regarding the procedure were encouraged and answered. The right neck and chest were prepped with chlorhexidine in a sterile fashion, and a sterile drape was applied covering the operative field. Maximum barrier sterile technique with sterile gowns and gloves were used for the procedure. A timeout was performed prior to the initiation of the procedure. After creating a small venotomy incision, a micropuncture kit was utilized to access the right internal jugular vein under direct, real-time ultrasound guidance after the overlying soft tissues were anesthetized with 1% lidocaine with epinephrine. Ultrasound image documentation was performed. The microwire was kinked to measure appropriate catheter length. A stiff Glidewire was advanced to the level of the IVC and the micropuncture sheath was exchanged for a peel-away sheath. A Palindrome tunneled hemodialysis catheter measuring 19 cm from tip to cuff was tunneled in a retrograde fashion from the anterior chest wall to the venotomy incision. The catheter was then placed through the peel-away sheath with tips ultimately positioned within the superior aspect of the right atrium. Final catheter positioning was confirmed and documented with a spot radiographic image. The catheter aspirates and flushes normally. The catheter was flushed with appropriate volume heparin dwells. The catheter exit site was secured with a 0-Prolene retention suture. The venotomy incision was closed with an interrupted 4-0 Vicryl, Dermabond and Steri-strips. Dressings were applied. The patient tolerated the procedure well without  immediate post procedural complication. IMPRESSION: Successful placement of 19 cm tip to cuff tunneled hemodialysis catheter via the right internal jugular vein with tips terminating within the superior aspect of the right atrium. The catheter is ready for immediate use. Electronically Signed   By: Jacqulynn Cadet M.D.   On: 03/20/2016 11:31   Ir US Guide Vasc Access Right  03/20/2016  INDICATION: 35 year old female with end-stage renal disease on peritoneal dialysis. However, reason sessions have been insufficient a decreasing creatinine and potassium. Therefore, a trial of hemodialysis is indicated and the patient requires hemodialysis access. EXAM: TUNNELED CENTRAL VENOUS HEMODIALYSIS CATHETER PLACEMENT WITH ULTRASOUND AND FLUOROSCOPIC GUIDANCE MEDICATIONS: 2 g Ancef . The antibiotic was given in an appropriate time interval prior to skin puncture. ANESTHESIA/SEDATION: Moderate (conscious) sedation was employed during this procedure. A total of Versed 2 mg and Fentanyl 50 mcg was administered intravenously. Moderate  Sedation Time: 20 minutes. The patient's level of consciousness and vital signs were monitored continuously by radiology nursing throughout the procedure under my direct supervision. FLUOROSCOPY TIME:  Fluoroscopy Time: 0 minutes 12 seconds (1 mGy). COMPLICATIONS: None immediate. Estimated blood loss: None PROCEDURE: Informed written consent was obtained from the patient after a discussion of the risks, benefits, and alternatives to treatment. Questions regarding the procedure were encouraged and answered. The right neck and chest were prepped with chlorhexidine in a sterile fashion, and a sterile drape was applied covering the operative field. Maximum barrier sterile technique with sterile gowns and gloves were used for the procedure. A timeout was performed prior to the initiation of the procedure. After creating a small venotomy incision, a micropuncture kit was utilized to access the right  internal jugular vein under direct, real-time ultrasound guidance after the overlying soft tissues were anesthetized with 1% lidocaine with epinephrine. Ultrasound image documentation was performed. The microwire was kinked to measure appropriate catheter length. A stiff Glidewire was advanced to the level of the IVC and the micropuncture sheath was exchanged for a peel-away sheath. A Palindrome tunneled hemodialysis catheter measuring 19 cm from tip to cuff was tunneled in a retrograde fashion from the anterior chest wall to the venotomy incision. The catheter was then placed through the peel-away sheath with tips ultimately positioned within the superior aspect of the right atrium. Final catheter positioning was confirmed and documented with a spot radiographic image. The catheter aspirates and flushes normally. The catheter was flushed with appropriate volume heparin dwells. The catheter exit site was secured with a 0-Prolene retention suture. The venotomy incision was closed with an interrupted 4-0 Vicryl, Dermabond and Steri-strips. Dressings were applied. The patient tolerated the procedure well without immediate post procedural complication. IMPRESSION: Successful placement of 19 cm tip to cuff tunneled hemodialysis catheter via the right internal jugular vein with tips terminating within the superior aspect of the right atrium. The catheter is ready for immediate use. Electronically Signed   By: Jacqulynn Cadet M.D.   On: 03/20/2016 11:31   Medications:   . allopurinol  300 mg Oral Daily  . gentamicin cream  1 application Topical Daily  . hydrocortisone cream   Topical TID  . hydroxychloroquine  200 mg Oral BID  . multivitamin  1 tablet Oral QHS  . mycophenolate  1,000 mg Oral BID  . polyethylene glycol  17 g Oral Daily  . predniSONE  5 mg Oral Daily  . sevelamer carbonate  3,200 mg Oral TID WC  . sevelamer carbonate  800-1,600 mg Oral With snacks  . sucralfate  1 g Oral TID WC & HS

## 2016-03-21 NOTE — Discharge Summary (Signed)
[facility] Discharge Summary   Patient: [name] [name] UVO:[id number]   PCP: Garret Reddish, MD DOB: 1981/06/30   Date of admission: 03/17/2016   Date of discharge: 03/21/2016    Discharge Diagnoses:  Principal Problem:   Intractable nausea and vomiting Active Problems:   Essential hypertension   Pulmonary hypertension, secondary ([place])   Secondary cardiomyopathy ([place])   ERYTHEMATOSUS, LUPUS   History of TIA (transient ischemic attack)   Gout   History of recurrent cellulitis of lower leg   CKD (chronic kidney disease) stage V requiring chronic peritoneal dialysis   Recommendations for Outpatient Follow-up:  1. Please continue HD starting tomorrow as an outpatient   Follow-up Information    Follow up with Garret Reddish, MD. Schedule an appointment as soon as possible for a visit in 1 week.   Specialty:  Family Medicine   Contact information:   [address] [place] [address] [phone]      Diet recommendation: renal diet  Activity: The patient is advised to gradually reintroduce usual activities.  Discharge Condition: good  History of present illness: As per the H and P dictated on admission, "[name] [name] is a 35 y.o. female with medical history significant for known lupus nephritis now on chronic peritoneal dialysis, history of lupus mediated cardiomyopathy with associated pulmonary hypertension (confirmed on right heart cath 2011), hypertension, gout, TIA, anemia of chronic kidney disease who presented to the hospital with intractable nausea and vomiting. Patient reports that Thursday morning she became very nauseous and had 1 episode of emesis but after vomiting felt better so she went to work as usual. While at work she continued to have severe nausea and multiple episodes of emesis so she returned home. Just prior to each episode of emesis she had some crampy abdominal pain that has not been otherwise having any abdominal pain. She's had no  fevers. She reports that her PD fluid is not cloudy. She has not had any recent flares of lupus and she has not had any medication changes per her report including any lupus medications. She was recently discharged on 5/4 after having intractable nausea and vomiting presumed related to oral antibiotics for recent treatment of cellulitis. Patient does report that there were concerns that despite nocturnal dialysis therapies she was incompletely dialyzing and therefore a change was made into her cycling and/or dialysate fluid the patient had not initiated these changes because of her ongoing nausea as stating she was concerned if she felt her abdomen with PD fluid that would make her nausea and vomiting worse."  Hospital Course:  Summary of her active problems in the hospital is as following. 1. Intractable nausea and vomiting Likely from uremia. Possibility of pill-induced gastritis from doxycycline cannot be excluded. X-ray abdomen does not show any acute abnormality. Lipase level negative. Peritoneal dialysate fluid Gram stain does not suggest any evidence of SBP. Resolved after HD and tolerated solid food.   2. ESRD secondary to SLE. On peritoneal dialysis. 7 days a week. Failed and will start outpatient HD. Not able to get current maximum treatment dose. Presenting with nausea and vomiting which is likely from uremia. No evidence of peritonitis. Appreciate nephrology input, pt will start outpatient HD.  3. SLE. Follows up with rheumatologist Dr. Ardeen Jourdain Continue CellCept, Deltasone and Plaquenil-home dose. On outpatient Benlysta IV. Also continue prednisone.  4. Essential hypertension. Blood pressure on lower side.  Discontinue all antihypertensive medications at present.  5. Possible pill-induced gastritis.  Patient recently completed  doxycycline course for 7 days. Her nausea and vomiting can also be pill induced gastritis from doxycycline. Continue Carafate.  6. History  of TIA. Not on any antiplatelet medications at present. No focal deficit at present.  All other chronic medical condition were stable during the hospitalization.  Patient was ambulatory without any assistance. On the day of the discharge the patient's vitals were stable, and no other acute medical condition were reported by patient. the patient was felt safe to be discharge at home with family.  Procedures and Results:  HD  [facility] placement  Consultations:  IR  Nephrology  DISCHARGE MEDICATION: Discharge Medication List as of 03/21/2016  4:57 PM    START taking these medications   Details  ondansetron (ZOFRAN ODT) 4 MG disintegrating tablet Take 1 tablet (4 mg total) by mouth every 8 (eight) hours as needed for nausea or vomiting., Starting 03/21/2016, Until Discontinued, Normal    polyethylene glycol (MIRALAX / GLYCOLAX) packet Take 17 g by mouth daily., Starting 03/21/2016, Until Discontinued, Normal      CONTINUE these medications which have CHANGED   Details  sucralfate (CARAFATE) 1 GM/10ML suspension Take 10 mLs (1 g total) by mouth 4 (four) times daily -  with meals and at bedtime., Starting 03/21/2016, Until Tue 03/28/16, Normal      CONTINUE these medications which have NOT CHANGED   Details  allopurinol (ZYLOPRIM) 300 MG tablet TAKE 1 TABLET BY MOUTH DAILY, Normal    calcitRIOL (ROCALTROL) 0.5 MCG capsule Take 0.5 mcg by mouth daily as needed., Starting 02/25/2016, Until Discontinued, Historical Med    desonide (DESOWEN) 0.05 % ointment Apply 1 application topically daily. , Starting 09/15/2014, Until Discontinued, Historical Med    gentamicin cream (GARAMYCIN) 0.1 % Apply 1 application topically 3 (three) times daily as needed. For cleaning exit site., Starting 12/28/2015, Until Discontinued, Historical Med    hydroxychloroquine (PLAQUENIL) 200 MG tablet TAKE 1 TABLET BY MOUTH TWICE A DAY, Normal    labetalol (NORMODYNE) 200 MG tablet Take 1 tablet (200 mg total) by mouth  3 (three) times daily. HOLD until follow-up with Dr Joelyn Oms, Starting 02/24/2016, Until Discontinued, No Print    mycophenolate (CELLCEPT) 500 MG tablet Take 1,000 mg by mouth 2 (two) times daily. , Until Discontinued, Historical Med    predniSONE (DELTASONE) 10 MG tablet Take 5 mg by mouth daily. , Starting 01/26/2016, Until Discontinued, Historical Med    RENVELA 800 MG tablet Take 1,600-3,200 mg by mouth as directed. 4 tablet by mouth three times daily with meals and 1-2 tablets with snacks., Starting 01/26/2016, Until Discontinued, Historical Med    triamcinolone lotion (KENALOG) 0.1 % Apply 1 application topically 2 (two) times daily as needed (dermatitis). , Starting 03/06/2016, Until Discontinued, Historical Med    valACYclovir (VALTREX) 1000 MG tablet Take 1,000 mg by mouth daily as needed (for flares.). Reported on 02/14/2016, Starting 10/29/2013, Until Discontinued, Historical Med      STOP taking these medications     doxycycline (VIBRA-TABS) 100 MG tablet      torsemide (DEMADEX) 100 MG tablet        Allergies  Allergen Reactions  . Lisinopril Swelling  . Nsaids     She should avoid all due to renal insufficiency   Discharge Instructions    Diet renal [id number]    Complete by:  As directed      Discharge instructions    Complete by:  As directed   It is important that you read following instructions  as well as go over your medication list with RN to help you understand your care after this hospitalization.  Discharge Instructions: Please follow-up with PCP in one week  Please request your primary care physician to go over all Hospital Tests and Procedure/Radiological results at the follow up,  Please get all Hospital records sent to your PCP by signing hospital release before you go home.   You were cared for by a hospitalist during your hospital stay. If you have any questions about your discharge medications or the care you received while you were in the hospital after  you are discharged, you can call the unit and ask to speak with the hospitalist on call if the hospitalist that took care of you is not available.  Once you are discharged, your primary care physician will handle any further medical issues. Please note that NO REFILLS for any discharge medications will be authorized once you are discharged, as it is imperative that you return to your primary care physician (or establish a relationship with a primary care physician if you do not have one) for your aftercare needs so that they can reassess your need for medications and monitor your lab values. You Must read complete instructions/literature along with all the possible adverse reactions/side effects for all the Medicines you take and that have been prescribed to you. Take any new Medicines after you have completely understood and accept all the possible adverse reactions/side effects. Wear Seat belts while driving. If you have smoked or chewed Tobacco in the last 2 yrs please stop smoking and/or stop any Recreational drug use.     Increase activity slowly    Complete by:  As directed           Discharge Exam: Filed Weights   03/20/16 1350 03/20/16 1819 03/20/16 2007  Weight: 93.6 kg (206 lb 5.6 oz) 93.2 kg (205 lb 7.5 oz) 92.8 kg (204 lb 9.4 oz)   Filed Vitals:   03/21/16 0526 03/21/16 0904  BP: 93/62 116/82  Pulse: 102 108  Temp: 98.3 F (36.8 C) 98.4 F (36.9 C)  Resp: 19 18   General: Appear in no distress, no Rash; Oral Mucosa moist. Cardiovascular: S1 and S2 Present, no Murmur, no JVD Respiratory: Bilateral Air entry present and Clear to Auscultation, no Crackles, no wheezes Abdomen: Bowel Sound present, Soft and no tenderness Extremities: no Pedal edema, no calf tenderness Neurology: Grossly no focal neuro deficit.  The results of significant diagnostics from this hospitalization (including imaging, microbiology, ancillary and laboratory) are listed below for reference.     Significant Diagnostic Studies: Ct Abdomen Pelvis Wo Contrast  02/22/2016  CLINICAL DATA:  Recurrent nausea and vomiting for the last 6 days, upper abdominal pain last few days EXAM: CT ABDOMEN AND PELVIS WITHOUT CONTRAST TECHNIQUE: Multidetector CT imaging of the abdomen and pelvis was performed following the standard protocol without IV contrast. COMPARISON:  Images of the upper abdomen CT scan 06/28/2010 FINDINGS: Lower chest: Small hiatal hernia is noted. Lung bases are unremarkable. Hepatobiliary: The unenhanced liver shows no biliary ductal dilatation. No calcified gallstones are noted within gallbladder. Small amount of perihepatic ascites. Pancreas: Unenhanced pancreas is unremarkable. Spleen: Unenhanced spleen is unremarkable. Adrenals/Urinary Tract: No adrenal gland mass. Unenhanced kidneys are symmetrical in size. No hydronephrosis or hydroureter. No nephrolithiasis. No calcified ureteral calculi. No calcified calculi are noted within under distended urinary bladder. Stomach/Bowel: There is no gastric outlet obstruction. No small bowel obstruction. No thickened or dilated small bowel loops  are noted. There is a left peritoneal dialysis catheter entering left upper abdominal wall with tip coiled within anterior pelvis. No pericecal inflammation. Normal appendix noted in axial image 66. No colitis or diverticulitis. No distal colonic obstruction. Vascular/Lymphatic: No retroperitoneal or mesenteric adenopathy. No aortic aneurysm. Reproductive: The unenhanced uterus and ovaries are unremarkable. Small pelvic free fluid is noted. Other: There is small pelvic ascites. Small ascites is noted in right lower quadrant. Small perihepatic ascites. Small amount of free air in upper abdomen most likely from peritoneal dialysis. No mesenteric abscess. The there is no inguinal adenopathy. Musculoskeletal: Sagittal images of the spine shows no destructive bony lesions. No destructive bony lesions are noted within  pelvis. IMPRESSION: 1. No nephrolithiasis.  No hydronephrosis or hydroureter. 2. A left anterior abdominal peritoneal dialysis catheter is noted. Small amount of free air in upper abdomen most likely from peritoneal dialysis. There is small perihepatic ascites. Small ascites is noted in right lower quadrant anteriorly. Small pelvic ascites. 3. No small bowel obstruction. No pericecal inflammation. Normal appendix. 4. No evidence of colitis or diverticulitis. 5. Unremarkable unenhanced uterus and ovaries. Electronically Signed   By: Lahoma Crocker M.D.   On: 02/22/2016 12:02   Ir Fluoro Guide Cv Line Right  03/20/2016  INDICATION: 35 year old female with end-stage renal disease on peritoneal dialysis. However, reason sessions have been insufficient a decreasing creatinine and potassium. Therefore, a trial of hemodialysis is indicated and the patient requires hemodialysis access. EXAM: TUNNELED CENTRAL VENOUS HEMODIALYSIS CATHETER PLACEMENT WITH ULTRASOUND AND FLUOROSCOPIC GUIDANCE MEDICATIONS: 2 g Ancef . The antibiotic was given in an appropriate time interval prior to skin puncture. ANESTHESIA/SEDATION: Moderate (conscious) sedation was employed during this procedure. A total of Versed 2 mg and Fentanyl 50 mcg was administered intravenously. Moderate Sedation Time: 20 minutes. The patient's level of consciousness and vital signs were monitored continuously by radiology nursing throughout the procedure under my direct supervision. FLUOROSCOPY TIME:  Fluoroscopy Time: 0 minutes 12 seconds (1 mGy). COMPLICATIONS: None immediate. Estimated blood loss: None PROCEDURE: Informed written consent was obtained from the patient after a discussion of the risks, benefits, and alternatives to treatment. Questions regarding the procedure were encouraged and answered. The right neck and chest were prepped with chlorhexidine in a sterile fashion, and a sterile drape was applied covering the operative field. Maximum barrier sterile  technique with sterile gowns and gloves were used for the procedure. A timeout was performed prior to the initiation of the procedure. After creating a small venotomy incision, a micropuncture kit was utilized to access the right internal jugular vein under direct, real-time ultrasound guidance after the overlying soft tissues were anesthetized with 1% lidocaine with epinephrine. Ultrasound image documentation was performed. The microwire was kinked to measure appropriate catheter length. A stiff Glidewire was advanced to the level of the IVC and the micropuncture sheath was exchanged for a peel-away sheath. A Palindrome tunneled hemodialysis catheter measuring 19 cm from tip to cuff was tunneled in a retrograde fashion from the anterior chest wall to the venotomy incision. The catheter was then placed through the peel-away sheath with tips ultimately positioned within the superior aspect of the right atrium. Final catheter positioning was confirmed and documented with a spot radiographic image. The catheter aspirates and flushes normally. The catheter was flushed with appropriate volume heparin dwells. The catheter exit site was secured with a 0-Prolene retention suture. The venotomy incision was closed with an interrupted 4-0 Vicryl, Dermabond and Steri-strips. Dressings were applied. The patient tolerated the  procedure well without immediate post procedural complication. IMPRESSION: Successful placement of 19 cm tip to cuff tunneled hemodialysis catheter via the right internal jugular vein with tips terminating within the superior aspect of the right atrium. The catheter is ready for immediate use. Electronically Signed   By: Jacqulynn Cadet M.D.   On: 03/20/2016 11:31   Ir US Guide Vasc Access Right  03/20/2016  INDICATION: 35 year old female with end-stage renal disease on peritoneal dialysis. However, reason sessions have been insufficient a decreasing creatinine and potassium. Therefore, a trial of  hemodialysis is indicated and the patient requires hemodialysis access. EXAM: TUNNELED CENTRAL VENOUS HEMODIALYSIS CATHETER PLACEMENT WITH ULTRASOUND AND FLUOROSCOPIC GUIDANCE MEDICATIONS: 2 g Ancef . The antibiotic was given in an appropriate time interval prior to skin puncture. ANESTHESIA/SEDATION: Moderate (conscious) sedation was employed during this procedure. A total of Versed 2 mg and Fentanyl 50 mcg was administered intravenously. Moderate Sedation Time: 20 minutes. The patient's level of consciousness and vital signs were monitored continuously by radiology nursing throughout the procedure under my direct supervision. FLUOROSCOPY TIME:  Fluoroscopy Time: 0 minutes 12 seconds (1 mGy). COMPLICATIONS: None immediate. Estimated blood loss: None PROCEDURE: Informed written consent was obtained from the patient after a discussion of the risks, benefits, and alternatives to treatment. Questions regarding the procedure were encouraged and answered. The right neck and chest were prepped with chlorhexidine in a sterile fashion, and a sterile drape was applied covering the operative field. Maximum barrier sterile technique with sterile gowns and gloves were used for the procedure. A timeout was performed prior to the initiation of the procedure. After creating a small venotomy incision, a micropuncture kit was utilized to access the right internal jugular vein under direct, real-time ultrasound guidance after the overlying soft tissues were anesthetized with 1% lidocaine with epinephrine. Ultrasound image documentation was performed. The microwire was kinked to measure appropriate catheter length. A stiff Glidewire was advanced to the level of the IVC and the micropuncture sheath was exchanged for a peel-away sheath. A Palindrome tunneled hemodialysis catheter measuring 19 cm from tip to cuff was tunneled in a retrograde fashion from the anterior chest wall to the venotomy incision. The catheter was then placed  through the peel-away sheath with tips ultimately positioned within the superior aspect of the right atrium. Final catheter positioning was confirmed and documented with a spot radiographic image. The catheter aspirates and flushes normally. The catheter was flushed with appropriate volume heparin dwells. The catheter exit site was secured with a 0-Prolene retention suture. The venotomy incision was closed with an interrupted 4-0 Vicryl, Dermabond and Steri-strips. Dressings were applied. The patient tolerated the procedure well without immediate post procedural complication. IMPRESSION: Successful placement of 19 cm tip to cuff tunneled hemodialysis catheter via the right internal jugular vein with tips terminating within the superior aspect of the right atrium. The catheter is ready for immediate use. Electronically Signed   By: Jacqulynn Cadet M.D.   On: 03/20/2016 11:31   Dg Abd Acute W/chest  03/21/2016  CLINICAL DATA:  Abdominal pain with nausea and vomiting EXAM: DG ABDOMEN ACUTE W/ 1V CHEST COMPARISON:  Chest radiograph August 05, 2007; abdominal radiograph Mar 18, 2016 FINDINGS: PA chest: Central catheter tip is in the superior vena cava. No pneumothorax. There is no edema or consolidation. Heart size and pulmonary vascularity are normal. No adenopathy. Supine and upright abdomen: There is a catheter coiled in the left pelvis. There is no bowel dilatation or air-fluid level suggesting obstruction. No free  air. Mild stool noted in colon. IMPRESSION: Bowel gas pattern unremarkable. Catheter coiled in left pelvis. Central catheter tip in superior vena cava. No pneumothorax. No edema or consolidation. Electronically Signed   By: Lowella Grip III M.D.   On: 03/21/2016 13:26   Dg Abd Portable 1v  03/18/2016  CLINICAL DATA:  Intractable nausea and vomiting. EXAM: PORTABLE ABDOMEN - 1 VIEW COMPARISON:  None. FINDINGS: The bowel gas pattern is normal. No radio-opaque calculi or other significant  radiographic abnormality are seen. Peritoneal dialysis catheter noted. IMPRESSION: Negative. Electronically Signed   By: Kathreen Devoid   On: 03/18/2016 13:18    Microbiology: Recent Results (from the past 240 hour(s))  Culture, body fluid-bottle     Status: None (Preliminary result)   Collection Time: 03/17/16  3:26 PM  Result Value Ref Range Status   Specimen Description FLUID PERITONEAL  Final   Special Requests BOTTLES DRAWN AEROBIC AND ANAEROBIC 10CC  Final   Culture NO GROWTH 4 DAYS  Final   Report Status PENDING  Incomplete  Gram stain     Status: None   Collection Time: 03/17/16  3:26 PM  Result Value Ref Range Status   Specimen Description FLUID PERITONEAL  Final   Special Requests NONE  Final   Gram Stain   Final    CYTOSPIN SMEAR WBC PRESENT,BOTH PMN AND MONONUCLEAR NO ORGANISMS SEEN    Report Status 03/17/2016 FINAL  Final  Surgical pcr screen     Status: None   Collection Time: 03/20/16 12:20 AM  Result Value Ref Range Status   MRSA, PCR NEGATIVE NEGATIVE Final   Staphylococcus aureus NEGATIVE NEGATIVE Final    Comment:        The Xpert SA Assay (FDA approved for NASAL specimens in patients over 64 years of age), is one component of a comprehensive surveillance program.  Test performance has been validated by [facility] for patients greater than or equal to 46 year old. It is not intended to diagnose infection nor to guide or monitor treatment.      Labs: CBC:  Recent Labs Lab 03/17/16 0420 03/18/16 0438 03/19/16 0650 03/20/16 1404 03/21/16 0553  WBC 6.7 5.7 6.2 6.9 5.8  NEUTROABS  --   --   --   --  3.7  HGB 12.7 11.3* 12.0 11.3* 10.7*  HCT 40.0 36.9 37.8 35.5* 34.6*  MCV 86.0 87.9 87.1 87.0 87.4  PLT 349 306 302 288 161   Basic Metabolic Panel:  Recent Labs Lab 03/17/16 0420 03/18/16 0438 03/19/16 0650 03/20/16 0517 03/21/16 0553  NA 141 136 136 135 134*  K 4.5 4.0 4.0 4.7 4.3  CL 92* 88* 88* 91* 92*  CO2 '25 27 26 '$ 21* 27  GLUCOSE  108* 85 90 79 83  BUN 71* 71* 69* 85* 36*  CREATININE 22.36* 21.34* 20.66* 22.81* 12.92*  CALCIUM 10.6* 10.0 10.0 9.4 9.2  MG  --   --  2.2  --   --   PHOS  --  12.1* 10.6* 14.3*  --    Liver Function Tests:  Recent Labs Lab 03/17/16 0420 03/18/16 0438 03/19/16 0650 03/20/16 0517 03/21/16 0553  AST 31  --   --   --  27  ALT 28  --   --   --  21  ALKPHOS 90  --   --   --  83  BILITOT 0.6  --   --   --  0.5  PROT 7.7  --   --   --  6.0*  ALBUMIN 3.6 3.1* 3.1* 3.0* 2.8*    Recent Labs Lab 03/17/16 0420 03/21/16 1206  LIPASE 48 37   Time spent: 30 minutes  Signed:  Kateria Cutrona  [facility] 03/21/2016, 5:22 PM

## 2016-03-21 NOTE — Progress Notes (Signed)
Discharge instructions, Rx's and follow up appts reviewed and provided to patient verbalized understanding. Patient was instructed to report to outpatient dialysis center in morning at 645am to sign paperwork and after that she will start hemodialysis. Patient left floor via wheelchair accompanied by staff no c/o pain or shortness of breath at d/c.   Frank Novelo, Tivis Ringer, RN

## 2016-03-22 LAB — CULTURE, BODY FLUID W GRAM STAIN -BOTTLE: Culture: NO GROWTH

## 2016-03-22 LAB — CULTURE, BODY FLUID-BOTTLE

## 2016-03-24 ENCOUNTER — Other Ambulatory Visit: Payer: Self-pay | Admitting: *Deleted

## 2016-03-24 DIAGNOSIS — Z01810 Encounter for preprocedural cardiovascular examination: Secondary | ICD-10-CM

## 2016-03-24 DIAGNOSIS — N186 End stage renal disease: Secondary | ICD-10-CM

## 2016-04-06 ENCOUNTER — Encounter: Payer: Self-pay | Admitting: Vascular Surgery

## 2016-04-10 ENCOUNTER — Ambulatory Visit: Payer: BLUE CROSS/BLUE SHIELD | Admitting: Infectious Diseases

## 2016-04-14 ENCOUNTER — Ambulatory Visit (HOSPITAL_COMMUNITY): Payer: BLUE CROSS/BLUE SHIELD

## 2016-04-14 ENCOUNTER — Ambulatory Visit: Payer: BLUE CROSS/BLUE SHIELD | Admitting: Vascular Surgery

## 2016-04-17 ENCOUNTER — Encounter: Payer: Self-pay | Admitting: Vascular Surgery

## 2016-04-21 ENCOUNTER — Ambulatory Visit: Payer: BLUE CROSS/BLUE SHIELD | Admitting: Vascular Surgery

## 2016-04-28 ENCOUNTER — Encounter: Payer: Self-pay | Admitting: Vascular Surgery

## 2016-04-28 ENCOUNTER — Ambulatory Visit (INDEPENDENT_AMBULATORY_CARE_PROVIDER_SITE_OTHER)
Admission: RE | Admit: 2016-04-28 | Discharge: 2016-04-28 | Disposition: A | Discharge status: Home / Self Care | Payer: BLUE CROSS/BLUE SHIELD | Source: Ambulatory Visit | Attending: Vascular Surgery | Admitting: Vascular Surgery

## 2016-04-28 ENCOUNTER — Ambulatory Visit (HOSPITAL_COMMUNITY)
Admission: RE | Admit: 2016-04-28 | Discharge: 2016-04-28 | Disposition: A | Discharge status: Home / Self Care | Payer: BLUE CROSS/BLUE SHIELD | Source: Ambulatory Visit | Attending: Vascular Surgery | Admitting: Vascular Surgery

## 2016-04-28 ENCOUNTER — Ambulatory Visit (INDEPENDENT_AMBULATORY_CARE_PROVIDER_SITE_OTHER): Payer: BLUE CROSS/BLUE SHIELD | Admitting: Vascular Surgery

## 2016-04-28 VITALS — BP 117/90 | HR 91 | Ht 67.0 in | Wt 220.8 lb

## 2016-04-28 DIAGNOSIS — N186 End stage renal disease: Secondary | ICD-10-CM

## 2016-04-28 DIAGNOSIS — I509 Heart failure, unspecified: Secondary | ICD-10-CM | POA: Diagnosis not present

## 2016-04-28 DIAGNOSIS — Z992 Dependence on renal dialysis: Secondary | ICD-10-CM | POA: Diagnosis not present

## 2016-04-28 DIAGNOSIS — Z01810 Encounter for preprocedural cardiovascular examination: Secondary | ICD-10-CM | POA: Insufficient documentation

## 2016-04-28 DIAGNOSIS — I132 Hypertensive heart and chronic kidney disease with heart failure and with stage 5 chronic kidney disease, or end stage renal disease: Secondary | ICD-10-CM | POA: Diagnosis not present

## 2016-04-28 NOTE — Progress Notes (Signed)
History of Present Illness:  Patient is a 35 y.o. year old female who presents for placement of a permanent hemodialysis access. The patient is right handed.  The patient is currently on hemodialysis via right tunneled  catheter.  She was on PD until it was not working well.  The cause of renal failure is thought to be secondary to SLE.  Other chronic medical problems include hypertension beta blocker currently on hold.  She denise DM and CAD.     Past Medical History  Diagnosis Date  . Unspecified essential hypertension   . Unspecified deficiency anemia   . Systemic lupus erythematosus ([place])   . Secondary cardiomyopathy, unspecified   . ERYTHEMATOSUS, LUPUS 08/07/2006  . CHF (congestive heart failure) ([place]) 5-6 yrs ago  hosp  . Chronic kidney disease   . Intestinal infection due to Clostridium difficile     Past Surgical History  Procedure Laterality Date  . Kidney biopsies      to determine lupus nephritis     Social History Social History  Substance Use Topics  . Smoking status: Never Smoker   . Smokeless tobacco: Never Used  . Alcohol Use: 0.0 oz/week    0 Standard drinks or equivalent per week     Comment: socially 2x a month    Family History Family History  Problem Relation Age of Onset  . Lupus Mother   . Cancer Maternal Grandmother     unknown  . Prostate cancer Paternal Grandfather   . Diabetes Brother   . Diabetes      mat cousin    Allergies  Allergies  Allergen Reactions  . Lisinopril Swelling  . Nsaids     She should avoid all due to renal insufficiency     Current Outpatient Prescriptions  Medication Sig Dispense Refill  . allopurinol (ZYLOPRIM) 300 MG tablet TAKE 1 TABLET BY MOUTH DAILY 90 tablet 2  . calcitRIOL (ROCALTROL) 0.5 MCG capsule Take 0.5 mcg by mouth daily as needed.  3  . gentamicin cream (GARAMYCIN) 0.1 % Apply 1 application topically 3 (three) times daily as needed. For cleaning exit site.  99  . hydroxychloroquine  (PLAQUENIL) 200 MG tablet TAKE 1 TABLET BY MOUTH TWICE A DAY 60 tablet 1  . mycophenolate (CELLCEPT) 500 MG tablet Take 1,000 mg by mouth 2 (two) times daily.     . ondansetron (ZOFRAN ODT) 4 MG disintegrating tablet Take 1 tablet (4 mg total) by mouth every 8 (eight) hours as needed for nausea or vomiting. 20 tablet 0  . predniSONE (DELTASONE) 10 MG tablet Take 5 mg by mouth daily.   12  . RENVELA 800 MG tablet Take 1,600-3,200 mg by mouth as directed. 4 tablet by mouth three times daily with meals and 1-2 tablets with snacks.  11  . valACYclovir (VALTREX) 1000 MG tablet Take 1,000 mg by mouth daily as needed (for flares.). Reported on 02/14/2016    . desonide (DESOWEN) 0.05 % ointment Apply 1 application topically daily. Reported on 04/28/2016  1  . labetalol (NORMODYNE) 200 MG tablet Take 1 tablet (200 mg total) by mouth 3 (three) times daily. HOLD until follow-up with Dr Joelyn Oms (Patient not taking: Reported on 04/28/2016) 90 tablet 0  . polyethylene glycol (MIRALAX / GLYCOLAX) packet Take 17 g by mouth daily. (Patient not taking: Reported on 04/28/2016) 14 each 0  . sucralfate (CARAFATE) 1 GM/10ML suspension Take 10 mLs (1 g total) by mouth 4 (four) times daily -  with meals and at bedtime. 420 mL 0  . triamcinolone lotion (KENALOG) 0.1 % Apply 1 application topically 2 (two) times daily as needed (dermatitis). Reported on 04/28/2016     No current facility-administered medications for this visit.    ROS:   General:  No weight loss, Fever, chills  HEENT: No recent headaches, no nasal bleeding, no visual changes, no sore throat  Neurologic: No dizziness, blackouts, seizures. No recent symptoms of stroke or mini- stroke. No recent episodes of slurred speech, or temporary blindness.  Cardiac: No recent episodes of chest pain/pressure, no shortness of breath at rest.  No shortness of breath with exertion.  Denies history of atrial fibrillation or irregular heartbeat  Vascular: No history of rest  pain in feet.  No history of claudication.  No history of non-healing ulcer, No history of DVT   Pulmonary: No home oxygen, no productive cough, no hemoptysis,  No asthma or wheezing  Musculoskeletal:  [ ]  Arthritis, [ ]  Low back pain,  [x ] Joint pain  Hematologic:No history of hypercoagulable state.  No history of easy bleeding.  No history of anemia  Gastrointestinal: No hematochezia or melena,  No gastroesophageal reflux, no trouble swallowing  Urinary: [x ] chronic Kidney disease, [ ]  on HD - [x ] MWS or [ ]  TTHS, [ ]  Burning with urination, [ ]  Frequent urination, [ ]  Difficulty urinating;   Skin: No rashes  Psychological: No history of anxiety,  No history of depression   Physical Examination  Filed Vitals:   04/28/16 1454  BP: 117/90  Pulse: 91  Height: 5\' 7"  (1.702 m)  Weight: 220 lb 12.8 oz (100.154 kg)  SpO2: 100%    Body mass index is 34.57 kg/(m^2).  General:  Alert and oriented, no acute distress HEENT: Normal Neck: No bruit or JVD Pulmonary: Clear to auscultation bilaterally Cardiac: Regular Rate and Rhythm without murmur Gastrointestinal: Soft, non-tender, non-distended, no mass, no scars Skin: No rash Extremity Pulses:  2+ radial, brachial pulses bilaterally Musculoskeletal: No deformity or edema  Neurologic: Upper and lower extremity motor 5/5 and symmetric  DATA:  Vein mapping shows an acceptable right upper arm cephalic vein.  0.22-0.30   ASSESSMENT:  ESRD currently on HD via catherter   PLAN: We will plan right BC fistula verses graft Aug. 1 st 2017 by Dr. Scot Dock.  The surgery risk were discussed and possible complications with fistula maturation in the future.  The patient agrees to proceed.  Theda Sers, Trei Schoch MAUREEN PA-C [facility]  The patient was seen in conjunction with Dr. Scot Dock today  I have interviewed the patient and examined the patient. I agree with the findings by the PA.  It looks like the  best option for an AV fistula would be a right brachiocephalic AV fistula. Diameters of the upper arm cephalic vein on the right range from 0.22-0.4 cm.  If this is not possible then we would plan on placing an AV graft.  She has a functioning catheter. Her surgery is scheduled for August 1.  Gae Gallop, MD [phone]

## 2016-05-02 ENCOUNTER — Other Ambulatory Visit: Payer: Self-pay

## 2016-05-22 ENCOUNTER — Encounter (HOSPITAL_COMMUNITY): Payer: Self-pay | Admitting: *Deleted

## 2016-05-22 NOTE — Progress Notes (Signed)
Pt verbalized understanding of all pre-op instructions. 

## 2016-05-22 NOTE — Progress Notes (Signed)
Pt denies SOB, chest pain, and being under the care of a cardiologist. Pt denies having an EKG within the l;ast year. Pt made aware to stop taking vitamins, fish oil and herbal medications. Spoke with Ebony Hail, [place], Anesthesia, regarding pt history.

## 2016-05-22 NOTE — Progress Notes (Signed)
Anesthesia Chart Review:  Pt is a 35 year old female scheduled for AV fistula creation vs graft insertion on 05/23/2016 with Deitra Mayo, MD.   Pt is a same day work up.   PMH includes: HTN, CHF, nonischemic cardiomyopathy, TIA, OSA, anemia, SLE, ESRD on peritoneal dialysis. Never smoker. BMI 34.5  Labs will be obtained DOS.   Pt will need EKG DOS.   Echo 03/17/16:  - Left ventricle: The cavity size was normal. Wall thickness was increased in a pattern of mild LVH. There was mild focal basal hypertrophy of the septum. Systolic function was normal. The estimated ejection fraction was in the range of 50% to 55%. Wall motion was normal; there were no regional wall motion abnormalities. Doppler parameters are consistent with abnormal left ventricular relaxation (grade 1 diastolic dysfunction). - Impressions: Normal LV systolic function; grade 1 diastolic dysfunction; mild LVH; trace MR; mild TR.  Nuclear stress test 12/13/15 (done at [facility] as part of pre-transplant work up; in care everywhere):  1.No inducible ischemia. 2.Left ventricular ejection fraction 51%. 3.Septal hypokinesis.  Right cardiac cath 07/15/10:  1. Mild to moderate pulmonary arterial hypertension with also a component of pulmonary venous hypertension in the setting of a mildly elevated wedge pressure 2. Adequate cardiac output. No evidence of significant cor pulmonale  Left and right heart cath 01/18/06:  1. Normal coronary arteries.  2. Nonischemic cardiomyopathy (EF 25% with global hypokinesis) with moderate pulmonary HTN most likely related to her elevated EDP and pulmonary capillary wedge pressure.   Pt used to see cardiology for cardiomyopathy (recovered EF in 2015) and HTN, but no office visit since 10/14/14 with Minus Breeding, MD.   If labs and EKG acceptable DOS, I anticipate pt can proceed as scheduled.   Willeen Cass, FNP-BC [facility]/Anesthesiology Phone:  [phone] 05/22/2016 12:05 PM'

## 2016-05-23 ENCOUNTER — Ambulatory Visit (HOSPITAL_COMMUNITY): Payer: BLUE CROSS/BLUE SHIELD | Admitting: Emergency Medicine

## 2016-05-23 ENCOUNTER — Encounter (HOSPITAL_COMMUNITY): Payer: Self-pay | Admitting: Surgery

## 2016-05-23 ENCOUNTER — Other Ambulatory Visit: Payer: Self-pay | Admitting: *Deleted

## 2016-05-23 ENCOUNTER — Ambulatory Visit (HOSPITAL_COMMUNITY)
Admission: RE | Admit: 2016-05-23 | Discharge: 2016-05-23 | Disposition: A | Discharge status: Home / Self Care | Payer: BLUE CROSS/BLUE SHIELD | Source: Ambulatory Visit | Attending: Vascular Surgery | Admitting: Vascular Surgery

## 2016-05-23 ENCOUNTER — Encounter (HOSPITAL_COMMUNITY)
Admission: RE | Disposition: A | Discharge status: Home / Self Care | Payer: Self-pay | Source: Ambulatory Visit | Attending: Vascular Surgery

## 2016-05-23 DIAGNOSIS — I429 Cardiomyopathy, unspecified: Secondary | ICD-10-CM | POA: Insufficient documentation

## 2016-05-23 DIAGNOSIS — M329 Systemic lupus erythematosus, unspecified: Secondary | ICD-10-CM | POA: Insufficient documentation

## 2016-05-23 DIAGNOSIS — I132 Hypertensive heart and chronic kidney disease with heart failure and with stage 5 chronic kidney disease, or end stage renal disease: Secondary | ICD-10-CM | POA: Insufficient documentation

## 2016-05-23 DIAGNOSIS — Z992 Dependence on renal dialysis: Secondary | ICD-10-CM | POA: Diagnosis not present

## 2016-05-23 DIAGNOSIS — I120 Hypertensive chronic kidney disease with stage 5 chronic kidney disease or end stage renal disease: Secondary | ICD-10-CM | POA: Diagnosis present

## 2016-05-23 DIAGNOSIS — I509 Heart failure, unspecified: Secondary | ICD-10-CM | POA: Diagnosis not present

## 2016-05-23 DIAGNOSIS — N186 End stage renal disease: Secondary | ICD-10-CM

## 2016-05-23 DIAGNOSIS — N185 Chronic kidney disease, stage 5: Secondary | ICD-10-CM | POA: Diagnosis not present

## 2016-05-23 DIAGNOSIS — Z4931 Encounter for adequacy testing for hemodialysis: Secondary | ICD-10-CM

## 2016-05-23 HISTORY — DX: Essential (primary) hypertension: I10

## 2016-05-23 HISTORY — PX: AV FISTULA PLACEMENT: SHX1204

## 2016-05-23 HISTORY — DX: Transient cerebral ischemic attack, unspecified: G45.9

## 2016-05-23 HISTORY — DX: Sleep apnea, unspecified: G47.30

## 2016-05-23 LAB — POCT I-STAT 4, (NA,K, GLUC, HGB,HCT)
GLUCOSE: 77 mg/dL (ref 65–99)
HEMATOCRIT: 36 % (ref 36.0–46.0)
HEMOGLOBIN: 12.2 g/dL (ref 12.0–15.0)
POTASSIUM: 4.4 mmol/L (ref 3.5–5.1)
Sodium: 139 mmol/L (ref 135–145)

## 2016-05-23 LAB — HCG, SERUM, QUALITATIVE: Preg, Serum: NEGATIVE

## 2016-05-23 SURGERY — ARTERIOVENOUS (AV) FISTULA CREATION
Anesthesia: Monitor Anesthesia Care | Site: Arm Upper | Laterality: Right

## 2016-05-23 MED ORDER — PROMETHAZINE HCL 25 MG/ML IJ SOLN: 6.2500 mg | INTRAMUSCULAR | Status: DC | PRN

## 2016-05-23 MED ORDER — SODIUM CHLORIDE 0.9 % IV SOLN
INTRAVENOUS | Status: DC | PRN
  Administered 2016-05-23: 500 mL

## 2016-05-23 MED ORDER — CHLORHEXIDINE GLUCONATE CLOTH 2 % EX PADS: 6.0000 | MEDICATED_PAD | Freq: Once | CUTANEOUS | Status: DC

## 2016-05-23 MED ORDER — MIDAZOLAM HCL 5 MG/5ML IJ SOLN
INTRAMUSCULAR | Status: DC | PRN
  Administered 2016-05-23: 2 mg via INTRAVENOUS

## 2016-05-23 MED ORDER — FENTANYL CITRATE (PF) 250 MCG/5ML IJ SOLN
INTRAMUSCULAR | Status: AC
  Filled 2016-05-23: qty 5

## 2016-05-23 MED ORDER — PROPOFOL 10 MG/ML IV BOLUS
INTRAVENOUS | Status: AC
  Filled 2016-05-23: qty 20

## 2016-05-23 MED ORDER — PAPAVERINE HCL 30 MG/ML IJ SOLN
INTRAMUSCULAR | Status: AC
  Filled 2016-05-23: qty 2

## 2016-05-23 MED ORDER — HEPARIN SODIUM (PORCINE) 1000 UNIT/ML IJ SOLN
INTRAMUSCULAR | Status: DC | PRN
  Administered 2016-05-23: 9000 [IU] via INTRAVENOUS

## 2016-05-23 MED ORDER — CEFUROXIME SODIUM 1.5 G IJ SOLR
1.5000 g | INTRAMUSCULAR | Status: AC
  Administered 2016-05-23: 1.5 g via INTRAVENOUS
  Filled 2016-05-23: qty 1.5

## 2016-05-23 MED ORDER — FENTANYL CITRATE (PF) 100 MCG/2ML IJ SOLN
25.0000 ug | INTRAMUSCULAR | Status: DC | PRN
  Administered 2016-05-23: 25 ug via INTRAVENOUS

## 2016-05-23 MED ORDER — 0.9 % SODIUM CHLORIDE (POUR BTL) OPTIME
TOPICAL | Status: DC | PRN
  Administered 2016-05-23: 1000 mL

## 2016-05-23 MED ORDER — PROPOFOL 500 MG/50ML IV EMUL
INTRAVENOUS | Status: DC | PRN
  Administered 2016-05-23: 100 ug/kg/min via INTRAVENOUS

## 2016-05-23 MED ORDER — LIDOCAINE HCL (PF) 1 % IJ SOLN
INTRAMUSCULAR | Status: DC | PRN
  Administered 2016-05-23: 23 mL

## 2016-05-23 MED ORDER — FENTANYL CITRATE (PF) 100 MCG/2ML IJ SOLN
INTRAMUSCULAR | Status: AC
  Filled 2016-05-23: qty 2

## 2016-05-23 MED ORDER — FENTANYL CITRATE (PF) 100 MCG/2ML IJ SOLN
INTRAMUSCULAR | Status: DC | PRN
  Administered 2016-05-23 (×2): 50 ug via INTRAVENOUS

## 2016-05-23 MED ORDER — OXYCODONE-ACETAMINOPHEN 5-325 MG PO TABS: 1.0000 | ORAL_TABLET | Freq: Four times a day (QID) | ORAL | 0 refills | Status: DC | PRN

## 2016-05-23 MED ORDER — THROMBIN 20000 UNITS EX SOLR
CUTANEOUS | Status: AC
  Filled 2016-05-23: qty 20000

## 2016-05-23 MED ORDER — ONDANSETRON HCL 4 MG/2ML IJ SOLN
INTRAMUSCULAR | Status: DC | PRN
  Administered 2016-05-23: 4 mg via INTRAVENOUS

## 2016-05-23 MED ORDER — PROTAMINE SULFATE 10 MG/ML IV SOLN
INTRAVENOUS | Status: DC | PRN
  Administered 2016-05-23: 50 mg via INTRAVENOUS

## 2016-05-23 MED ORDER — SODIUM CHLORIDE 0.9 % IV SOLN
INTRAVENOUS | Status: DC
  Administered 2016-05-23: 09:00:00 via INTRAVENOUS

## 2016-05-23 MED ORDER — MIDAZOLAM HCL 2 MG/2ML IJ SOLN
INTRAMUSCULAR | Status: AC
  Filled 2016-05-23: qty 2

## 2016-05-23 MED ORDER — LIDOCAINE HCL (PF) 1 % IJ SOLN
INTRAMUSCULAR | Status: AC
  Filled 2016-05-23: qty 30

## 2016-05-23 SURGICAL SUPPLY — 33 items
ARMBAND PINK RESTRICT EXTREMIT (MISCELLANEOUS) ×3 IMPLANT
CANISTER SUCTION 2500CC (MISCELLANEOUS) ×3 IMPLANT
CANNULA VESSEL 3MM 2 BLNT TIP (CANNULA) ×3 IMPLANT
CLIP TI MEDIUM 6 (CLIP) ×3 IMPLANT
CLIP TI WIDE RED SMALL 6 (CLIP) ×6 IMPLANT
COVER PROBE W GEL 5X96 (DRAPES) IMPLANT
DECANTER SPIKE VIAL GLASS SM (MISCELLANEOUS) IMPLANT
ELECT REM PT RETURN 9FT ADLT (ELECTROSURGICAL) ×3
ELECTRODE REM PT RTRN 9FT ADLT (ELECTROSURGICAL) ×1 IMPLANT
GLOVE BIO SURGEON STRL SZ7.5 (GLOVE) ×3 IMPLANT
GLOVE BIOGEL PI IND STRL 6.5 (GLOVE) ×3 IMPLANT
GLOVE BIOGEL PI IND STRL 7.5 (GLOVE) ×1 IMPLANT
GLOVE BIOGEL PI IND STRL 8 (GLOVE) ×1 IMPLANT
GLOVE BIOGEL PI INDICATOR 6.5 (GLOVE) ×6
GLOVE BIOGEL PI INDICATOR 7.5 (GLOVE) ×2
GLOVE BIOGEL PI INDICATOR 8 (GLOVE) ×2
GLOVE ECLIPSE 6.5 STRL STRAW (GLOVE) ×3 IMPLANT
GLOVE ECLIPSE 7.0 STRL STRAW (GLOVE) ×3 IMPLANT
GOWN STRL REUS W/ TWL LRG LVL3 (GOWN DISPOSABLE) ×3 IMPLANT
GOWN STRL REUS W/TWL LRG LVL3 (GOWN DISPOSABLE) ×6
KIT BASIN OR (CUSTOM PROCEDURE TRAY) ×3 IMPLANT
KIT ROOM TURNOVER OR (KITS) ×3 IMPLANT
LIQUID BAND (GAUZE/BANDAGES/DRESSINGS) ×3 IMPLANT
NS IRRIG 1000ML POUR BTL (IV SOLUTION) ×3 IMPLANT
PACK CV ACCESS (CUSTOM PROCEDURE TRAY) ×3 IMPLANT
PAD ARMBOARD 7.5X6 YLW CONV (MISCELLANEOUS) ×6 IMPLANT
SPONGE SURGIFOAM ABS GEL 100 (HEMOSTASIS) IMPLANT
SUT PROLENE 6 0 BV (SUTURE) ×3 IMPLANT
SUT VIC AB 3-0 SH 27 (SUTURE) ×4
SUT VIC AB 3-0 SH 27X BRD (SUTURE) ×2 IMPLANT
SUT VICRYL 4-0 PS2 18IN ABS (SUTURE) ×6 IMPLANT
UNDERPAD 30X30 INCONTINENT (UNDERPADS AND DIAPERS) ×3 IMPLANT
WATER STERILE IRR 1000ML POUR (IV SOLUTION) ×3 IMPLANT

## 2016-05-23 NOTE — Anesthesia Preprocedure Evaluation (Signed)
Anesthesia Evaluation  Patient identified by MRN, date of birth, ID band Patient awake    Reviewed: Allergy & Precautions, NPO status , Patient's Chart, lab work & pertinent test results  Airway Mallampati: II  TM Distance: >3 FB Neck ROM: Full    Dental no notable dental hx.    Pulmonary sleep apnea ,    Pulmonary exam normal breath sounds clear to auscultation       Cardiovascular hypertension, Normal cardiovascular exam Rhythm:Regular Rate:Normal     Neuro/Psych TIAnegative psych ROS   GI/Hepatic negative GI ROS, Neg liver ROS,   Endo/Other  negative endocrine ROS  Renal/GU ESRFRenal disease  negative genitourinary   Musculoskeletal negative musculoskeletal ROS (+)   Abdominal   Peds negative pediatric ROS (+)  Hematology  (+) anemia ,   Anesthesia Other Findings   Reproductive/Obstetrics negative OB ROS                             Anesthesia Physical Anesthesia Plan  ASA: III  Anesthesia Plan: MAC   Post-op Pain Management:    Induction: Intravenous  Airway Management Planned: Simple Face Mask  Additional Equipment:   Intra-op Plan:   Post-operative Plan:   Informed Consent: I have reviewed the patients History and Physical, chart, labs and discussed the procedure including the risks, benefits and alternatives for the proposed anesthesia with the patient or authorized representative who has indicated his/her understanding and acceptance.   Dental advisory given  Plan Discussed with: CRNA and Surgeon  Anesthesia Plan Comments:         Anesthesia Quick Evaluation

## 2016-05-23 NOTE — Interval H&P Note (Signed)
History and Physical Interval Note:  05/23/2016 9:13 AM  [name] [name]  has presented today for surgery, with the diagnosis of End Stage Renal Disease N18.6  The various methods of treatment have been discussed with the patient and family. After consideration of risks, benefits and other options for treatment, the patient has consented to  Procedure(s): ARTERIOVENOUS (AV) FISTULA CREATION VERSUS GRAFT INSERTION (Right) as a surgical intervention .  The patient's history has been reviewed, patient examined, no change in status, stable for surgery.  I have reviewed the patient's chart and labs.  Questions were answered to the patient's satisfaction.     Deitra Mayo

## 2016-05-23 NOTE — H&P (View-Only) (Signed)
History of Present Illness:  Patient is a 35 y.o. year old female who presents for placement of a permanent hemodialysis access. The patient is right handed.  The patient is currently on hemodialysis via right tunneled  catheter.  She was on PD until it was not working well.  The cause of renal failure is thought to be secondary to SLE.  Other chronic medical problems include hypertension beta blocker currently on hold.  She denise DM and CAD.     Past Medical History  Diagnosis Date  . Unspecified essential hypertension   . Unspecified deficiency anemia   . Systemic lupus erythematosus ([place])   . Secondary cardiomyopathy, unspecified   . ERYTHEMATOSUS, LUPUS 08/07/2006  . CHF (congestive heart failure) ([place]) 5-6 yrs ago  hosp  . Chronic kidney disease   . Intestinal infection due to Clostridium difficile     Past Surgical History  Procedure Laterality Date  . Kidney biopsies      to determine lupus nephritis     Social History Social History  Substance Use Topics  . Smoking status: Never Smoker   . Smokeless tobacco: Never Used  . Alcohol Use: 0.0 oz/week    0 Standard drinks or equivalent per week     Comment: socially 2x a month    Family History Family History  Problem Relation Age of Onset  . Lupus Mother   . Cancer Maternal Grandmother     unknown  . Prostate cancer Paternal Grandfather   . Diabetes Brother   . Diabetes      mat cousin    Allergies  Allergies  Allergen Reactions  . Lisinopril Swelling  . Nsaids     She should avoid all due to renal insufficiency     Current Outpatient Prescriptions  Medication Sig Dispense Refill  . allopurinol (ZYLOPRIM) 300 MG tablet TAKE 1 TABLET BY MOUTH DAILY 90 tablet 2  . calcitRIOL (ROCALTROL) 0.5 MCG capsule Take 0.5 mcg by mouth daily as needed.  3  . gentamicin cream (GARAMYCIN) 0.1 % Apply 1 application topically 3 (three) times daily as needed. For cleaning exit site.  99  . hydroxychloroquine  (PLAQUENIL) 200 MG tablet TAKE 1 TABLET BY MOUTH TWICE A DAY 60 tablet 1  . mycophenolate (CELLCEPT) 500 MG tablet Take 1,000 mg by mouth 2 (two) times daily.     . ondansetron (ZOFRAN ODT) 4 MG disintegrating tablet Take 1 tablet (4 mg total) by mouth every 8 (eight) hours as needed for nausea or vomiting. 20 tablet 0  . predniSONE (DELTASONE) 10 MG tablet Take 5 mg by mouth daily.   12  . RENVELA 800 MG tablet Take 1,600-3,200 mg by mouth as directed. 4 tablet by mouth three times daily with meals and 1-2 tablets with snacks.  11  . valACYclovir (VALTREX) 1000 MG tablet Take 1,000 mg by mouth daily as needed (for flares.). Reported on 02/14/2016    . desonide (DESOWEN) 0.05 % ointment Apply 1 application topically daily. Reported on 04/28/2016  1  . labetalol (NORMODYNE) 200 MG tablet Take 1 tablet (200 mg total) by mouth 3 (three) times daily. HOLD until follow-up with Dr Joelyn Oms (Patient not taking: Reported on 04/28/2016) 90 tablet 0  . polyethylene glycol (MIRALAX / GLYCOLAX) packet Take 17 g by mouth daily. (Patient not taking: Reported on 04/28/2016) 14 each 0  . sucralfate (CARAFATE) 1 GM/10ML suspension Take 10 mLs (1 g total) by mouth 4 (four) times daily -  with meals and at bedtime. 420 mL 0  . triamcinolone lotion (KENALOG) 0.1 % Apply 1 application topically 2 (two) times daily as needed (dermatitis). Reported on 04/28/2016     No current facility-administered medications for this visit.    ROS:   General:  No weight loss, Fever, chills  HEENT: No recent headaches, no nasal bleeding, no visual changes, no sore throat  Neurologic: No dizziness, blackouts, seizures. No recent symptoms of stroke or mini- stroke. No recent episodes of slurred speech, or temporary blindness.  Cardiac: No recent episodes of chest pain/pressure, no shortness of breath at rest.  No shortness of breath with exertion.  Denies history of atrial fibrillation or irregular heartbeat  Vascular: No history of rest  pain in feet.  No history of claudication.  No history of non-healing ulcer, No history of DVT   Pulmonary: No home oxygen, no productive cough, no hemoptysis,  No asthma or wheezing  Musculoskeletal:  [ ]  Arthritis, [ ]  Low back pain,  [x ] Joint pain  Hematologic:No history of hypercoagulable state.  No history of easy bleeding.  No history of anemia  Gastrointestinal: No hematochezia or melena,  No gastroesophageal reflux, no trouble swallowing  Urinary: [x ] chronic Kidney disease, [ ]  on HD - [x ] MWS or [ ]  TTHS, [ ]  Burning with urination, [ ]  Frequent urination, [ ]  Difficulty urinating;   Skin: No rashes  Psychological: No history of anxiety,  No history of depression   Physical Examination  Filed Vitals:   04/28/16 1454  BP: 117/90  Pulse: 91  Height: 5\' 7"  (1.702 m)  Weight: 220 lb 12.8 oz (100.154 kg)  SpO2: 100%    Body mass index is 34.57 kg/(m^2).  General:  Alert and oriented, no acute distress HEENT: Normal Neck: No bruit or JVD Pulmonary: Clear to auscultation bilaterally Cardiac: Regular Rate and Rhythm without murmur Gastrointestinal: Soft, non-tender, non-distended, no mass, no scars Skin: No rash Extremity Pulses:  2+ radial, brachial pulses bilaterally Musculoskeletal: No deformity or edema  Neurologic: Upper and lower extremity motor 5/5 and symmetric  DATA:  Vein mapping shows an acceptable right upper arm cephalic vein.  0.22-0.30   ASSESSMENT:  ESRD currently on HD via catherter   PLAN: We will plan right BC fistula verses graft Aug. 1 st 2017 by Dr. Scot Dock.  The surgery risk were discussed and possible complications with fistula maturation in the future.  The patient agrees to proceed.  Theda Sers, EMMA MAUREEN PA-C [facility]  The patient was seen in conjunction with Dr. Scot Dock today  I have interviewed the patient and examined the patient. I agree with the findings by the PA.  It looks like the  best option for an AV fistula would be a right brachiocephalic AV fistula. Diameters of the upper arm cephalic vein on the right range from 0.22-0.4 cm.  If this is not possible then we would plan on placing an AV graft.  She has a functioning catheter. Her surgery is scheduled for August 1.  Gae Gallop, MD [phone]

## 2016-05-23 NOTE — Transfer of Care (Signed)
Immediate Anesthesia Transfer of Care Note  Patient: [name] [name]  Procedure(s) Performed: Procedure(s): ARTERIOVENOUS (AV) FISTULA CREATION VERSUS GRAFT INSERTION (Right)  Patient Location: PACU  Anesthesia Type:MAC  Level of Consciousness: awake, alert  and oriented  Airway & Oxygen Therapy: Patient Spontanous Breathing  Post-op Assessment: Report given to RN and Post -op Vital signs reviewed and stable  Post vital signs: Reviewed and stable  Last Vitals:  Vitals:   05/23/16 0824  BP: 119/69  Pulse: 92  Resp: 18  Temp: 37 C    Last Pain:  Vitals:   05/23/16 0824  TempSrc: Oral         Complications: No apparent anesthesia complications

## 2016-05-23 NOTE — Anesthesia Postprocedure Evaluation (Signed)
Anesthesia Post Note  Patient: [name] [name]  Procedure(s) Performed: Procedure(s) (LRB): ARTERIOVENOUS (AV) FISTULA CREATION VERSUS GRAFT INSERTION (Right)  Patient location during evaluation: PACU Anesthesia Type: MAC Level of consciousness: awake and alert Pain management: pain level controlled Vital Signs Assessment: post-procedure vital signs reviewed and stable Respiratory status: spontaneous breathing, nonlabored ventilation, respiratory function stable and patient connected to nasal cannula oxygen Cardiovascular status: stable and blood pressure returned to baseline Anesthetic complications: no    Last Vitals:  Vitals:   05/23/16 0824 05/23/16 1125  BP: 119/69   Pulse: 92   Resp: 18   Temp: 37 C 36.5 C    Last Pain:  Vitals:   05/23/16 1125  TempSrc:   PainSc: 0-No pain                 Threasa Kinch S

## 2016-05-23 NOTE — Op Note (Signed)
    NAME: [name] [name]   MRN: [id number] DOB: 12-Nov-1980    DATE OF OPERATION: 05/23/2016  PREOP DIAGNOSIS: Stage V CKD  POSTOP DIAGNOSIS: Same  PROCEDURE: Right brachial cephalic arteriovenous fistula  SURGEON: Judeth Cornfield. Scot Dock, MD, FACS  ASSIST: Gerri Lins PA  ANESTHESIA: local with sedation   EBL: minimal  INDICATIONS: [name] [name] is a 35 y.o. female who presents for new access. She has a functioning dialysis catheter.  FINDINGS: 4 mm upper arm cephalic vein right arm. The vein was somewhat deep so I superficialized thisin the lower half of the upper arm  TECHNIQUE: The patient was taken to the operating room and sedated by anesthesia. The right upper extremity was prepped and draped in usual sterile fashion. A transverse incision was made at the antecubital level after the skin was anesthetized. The cephalic vein was dissected free and ligated distally. It irrigated up nicely with heparinized saline. The brachial artery was dissected free beneath the fascia. The patient was heparinized. The brachial artery was clamped proximally and distally and a longitudinal arteriotomy was made. The vein was sewn end-to-side to the artery using continuous 6-0 Prolene suture. There was a good thrill at the completion and a good radial and ulnar signal with the Doppler. The vein was somewhat deep and therefore I made a separate longitudinal incision in the mid upper arm after the skin was anesthetized. The vein was superficialized etween these 2 areas. Hemostasis was obtained in the wounds of the heparin was partially reversed with protamine. The wounds were closed with a 4-0 Vicryl. A sterile dressing was applied. The patient tolerated the procedure well and was transferred to the recovery room in stable condition. All needle and sponge counts were correct.  Deitra Mayo, MD, FACS [facility]  DATE OF DICTATION:   05/23/2016

## 2016-05-23 NOTE — Anesthesia Procedure Notes (Signed)
Procedure Name: MAC Date/Time: 05/23/2016 9:45 AM Performed by: Babs Bertin Pre-anesthesia Checklist: Patient identified, Emergency Drugs available, Suction available, Timeout performed and Patient being monitored Patient Re-evaluated:Patient Re-evaluated prior to inductionOxygen Delivery Method: Nasal cannula

## 2016-05-24 ENCOUNTER — Encounter (HOSPITAL_COMMUNITY): Payer: Self-pay | Admitting: Vascular Surgery

## 2016-05-25 ENCOUNTER — Telehealth: Payer: Self-pay | Admitting: Vascular Surgery

## 2016-05-25 NOTE — Telephone Encounter (Signed)
-----   Message from Mena Goes, RN sent at 05/23/2016 12:10 PM EDT ----- Regarding: 6 weeks w/ dialysis duplex   ----- Message ----- From: Angelia Mould, MD Sent: 05/23/2016  11:06 AM To: [facility] Charge Pool Subject: charge                                         PROCEDURE: Right brachial cephalic arteriovenous fistula  SURGEON: Judeth Cornfield. Scot Dock, MD, FACS  ASSIST: Gerri Lins PA  He'll need a follow up visit in 6 weeks with a duplex of her fistula at that time. Thank you. CD

## 2016-05-25 NOTE — Telephone Encounter (Signed)
Sched appt 9/13; lab at 10:00 and MD at 11:00. Lm on cell# to inform pt of appt.

## 2016-06-29 ENCOUNTER — Encounter: Payer: Self-pay | Admitting: Vascular Surgery

## 2016-07-05 ENCOUNTER — Encounter: Payer: BLUE CROSS/BLUE SHIELD | Admitting: Vascular Surgery

## 2016-07-05 ENCOUNTER — Encounter (HOSPITAL_COMMUNITY): Payer: BLUE CROSS/BLUE SHIELD

## 2016-07-06 ENCOUNTER — Ambulatory Visit (HOSPITAL_COMMUNITY)
Admission: RE | Admit: 2016-07-06 | Discharge: 2016-07-06 | Disposition: A | Discharge status: Home / Self Care | Payer: BLUE CROSS/BLUE SHIELD | Source: Ambulatory Visit | Attending: Vascular Surgery | Admitting: Vascular Surgery

## 2016-07-06 DIAGNOSIS — N186 End stage renal disease: Secondary | ICD-10-CM

## 2016-07-06 DIAGNOSIS — Z4931 Encounter for adequacy testing for hemodialysis: Secondary | ICD-10-CM | POA: Insufficient documentation

## 2016-07-07 ENCOUNTER — Encounter: Payer: Self-pay | Admitting: Vascular Surgery

## 2016-07-13 ENCOUNTER — Encounter: Payer: Self-pay | Admitting: Vascular Surgery

## 2016-07-13 ENCOUNTER — Ambulatory Visit (INDEPENDENT_AMBULATORY_CARE_PROVIDER_SITE_OTHER): Payer: Self-pay | Admitting: Vascular Surgery

## 2016-07-13 VITALS — BP 125/88 | HR 104 | Temp 98.3°F | Resp 18 | Ht 67.0 in | Wt 222.0 lb

## 2016-07-13 DIAGNOSIS — Z992 Dependence on renal dialysis: Secondary | ICD-10-CM

## 2016-07-13 DIAGNOSIS — N186 End stage renal disease: Secondary | ICD-10-CM

## 2016-07-13 NOTE — Progress Notes (Signed)
   Patient name: [name] [name] MRN: [id number] DOB: 19-Apr-1981 Sex: female  REASON FOR VISIT: Follow up after right brachiocephalic AV fistula  HPI: [name] [name] is a 35 y.o. female who had a right brachiocephalic AV fistula placed on a 117. She comes in for a 6 week follow up visit. She has no specific complaints. She denies pain or paresthesias in her right arm. She dialyzes with her right IJ tunneled dialysis catheter.  Current Outpatient Prescriptions  Medication Sig Dispense Refill  . allopurinol (ZYLOPRIM) 300 MG tablet TAKE 1 TABLET BY MOUTH DAILY 90 tablet 2  . calcitRIOL (ROCALTROL) 0.5 MCG capsule Take 0.5 mcg by mouth 3 (three) times a week. On Monday, Wednesday and Saturday.  3  . desonide (DESOWEN) 0.05 % ointment Apply 1 application topically daily. Reported on 04/28/2016  1  . gentamicin cream (GARAMYCIN) 0.1 % Apply 1 application topically 3 (three) times daily as needed. For cleaning exit site.  99  . hydroxychloroquine (PLAQUENIL) 200 MG tablet TAKE 1 TABLET BY MOUTH TWICE A DAY 60 tablet 1  . mycophenolate (CELLCEPT) 500 MG tablet Take 1,000 mg by mouth 2 (two) times daily.     . predniSONE (DELTASONE) 10 MG tablet Take 5 mg by mouth daily.   12  . RENVELA 800 MG tablet Take 1,600-3,200 mg by mouth as directed. 4 tablet by mouth three times daily with meals and 1-2 tablets with snacks.  11  . oxyCODONE-acetaminophen (PERCOCET/ROXICET) 5-325 MG tablet Take 1 tablet by mouth every 6 (six) hours as needed. (Patient not taking: Reported on 07/13/2016) 8 tablet 0  . valACYclovir (VALTREX) 1000 MG tablet Take 1,000 mg by mouth daily as needed (for flares.). Reported on 02/14/2016     No current facility-administered medications for this visit.     REVIEW OF SYSTEMS:  [X]  denotes positive finding, [ ]  denotes negative finding Cardiac  Comments:  Chest pain or chest pressure:    Shortness of breath upon exertion:    Short of breath when lying flat:    Irregular heart  rhythm:    Constitutional    Fever or chills:      PHYSICAL EXAM: Vitals:   07/13/16 1554  BP: 125/88  Pulse: (!) 104  Resp: 18  Temp: 98.3 F (36.8 C)  SpO2: 99%  Weight: 222 lb (100.7 kg)  Height: 5\' 7"  (1.702 m)    GENERAL: The patient is a well-nourished female, in no acute distress. The vital signs are documented above. CARDIOVASCULAR: There is a regular rate and rhythm. PULMONARY: There is good air exchange bilaterally without wheezing or rales. Her incisions are healing nicely. She has an excellent thrill in her right upper arm fistula.  DUPLEX OF RIGHT BRACHIOCEPHALIC AV FISTULA: I have reviewed the duplex of the right brachiocephalic A-V fistula was done on 07/06/2016. This shows that the diameters of the fistula range from 0.57-0.66 cm.  MEDICAL ISSUES:  STATUS POST RIGHT BRACHIOCEPHALIC AV FISTULA: The patient is doing well status post right brachiocephalic AV fistula. The fistula appears to be maturing nicely. This should be ready for use in early November. I will see her back prn.    Deitra Mayo [facility] [phone]

## 2016-09-04 ENCOUNTER — Ambulatory Visit: Payer: BLUE CROSS/BLUE SHIELD | Admitting: Family Medicine

## 2016-11-27 ENCOUNTER — Encounter: Payer: Self-pay | Admitting: Family Medicine

## 2016-11-27 ENCOUNTER — Ambulatory Visit (INDEPENDENT_AMBULATORY_CARE_PROVIDER_SITE_OTHER): Payer: BLUE CROSS/BLUE SHIELD | Admitting: Family Medicine

## 2016-11-27 VITALS — BP 128/88 | HR 78 | Temp 98.3°F | Ht 67.0 in | Wt 219.0 lb

## 2016-11-27 DIAGNOSIS — R0789 Other chest pain: Secondary | ICD-10-CM

## 2016-11-27 DIAGNOSIS — Z992 Dependence on renal dialysis: Secondary | ICD-10-CM | POA: Diagnosis not present

## 2016-11-27 DIAGNOSIS — R079 Chest pain, unspecified: Secondary | ICD-10-CM | POA: Diagnosis not present

## 2016-11-27 DIAGNOSIS — N186 End stage renal disease: Secondary | ICD-10-CM | POA: Diagnosis not present

## 2016-11-27 NOTE — Progress Notes (Signed)
Pre visit review using our clinic review tool, if applicable. No additional management support is needed unless otherwise documented below in the visit note. 

## 2016-11-27 NOTE — Progress Notes (Signed)
Subjective:  [name] [name] is a 36 y.o. year old very pleasant female patient who presents for/with See problem oriented charting ROS- no fever, chills, DOE, SOB, diaphoresis, left arm or neck pain   Past Medical History-  Patient Active Problem List   Diagnosis Date Noted  . ESRD on dialysis [facility]) 08/05/2015    Priority: High  . Essential hypertension 09/22/2010    Priority: High  . Secondary cardiomyopathy ([place]) 07/14/2009    Priority: High  . LUPUS NEPHRITIS 07/14/2009    Priority: High  . ERYTHEMATOSUS, LUPUS 08/07/2006    Priority: High  . History of recurrent cellulitis of lower leg 03/09/2016    Priority: Medium  . Gout 08/14/2012    Priority: Medium  . Pulmonary hypertension, secondary ([place]) 07/28/2010    Priority: Medium  . History of TIA (transient ischemic attack) 07/14/2009    Priority: Medium  . Onychomycosis 12/02/2014    Priority: Low  . Anemia 08/07/2006    Priority: Low  . Atypical chest pain 11/27/2016  . CKD (chronic kidney disease) stage V requiring chronic peritoneal dialysis  03/17/2016  . Intractable nausea and vomiting 02/20/2016  . Enteritis due to Clostridium difficile 05/03/2015    Medications- reviewed and updated Current Outpatient Prescriptions  Medication Sig Dispense Refill  . hydroxychloroquine (PLAQUENIL) 200 MG tablet TAKE 1 TABLET BY MOUTH TWICE A DAY 60 tablet 1  . LABETALOL HCL PO Take 450 mg by mouth 2 (two) times daily.    . mycophenolate (CELLCEPT) 500 MG tablet Take 1,000 mg by mouth 2 (two) times daily.     Marland Kitchen oxyCODONE-acetaminophen (PERCOCET/ROXICET) 5-325 MG tablet Take 1 tablet by mouth every 6 (six) hours as needed. 8 tablet 0  . predniSONE (DELTASONE) 10 MG tablet Take 5 mg by mouth daily.   12  . RENVELA 800 MG tablet Take 1,600-3,200 mg by mouth as directed. 4 tablet by mouth three times daily with meals and 1-2 tablets with snacks.  11  . allopurinol (ZYLOPRIM) 300 MG tablet TAKE 1 TABLET BY MOUTH DAILY (Patient not  taking: Reported on 11/27/2016) 90 tablet 2  . valACYclovir (VALTREX) 1000 MG tablet Take 1,000 mg by mouth daily as needed (for flares.). Reported on 02/14/2016     No current facility-administered medications for this visit.     Objective: BP 128/88 (BP Location: Left Arm, Patient Position: Sitting, Cuff Size: Large)   Pulse 78   Temp 98.3 F (36.8 C) (Oral)   Ht 5\' 7"  (1.702 m)   Wt 219 lb (99.3 kg)   SpO2 99%   BMI 34.30 kg/m  Gen: NAD, resting comfortably TM normal bilaterally, some wax along bottom of left ear canal, normal on rignt CV: RRR no murmurs rubs or gallops No chest wall pain Lungs: CTAB no crackles, wheeze, rhonchi Abdomen: obese Ext: no edema Skin: warm, dry  Assessment/Plan:  Atypical chest pain S: feels some pressure in her chest starting at dialysis. About a week ago, on and off the rest of that day. If sitting no pain, if movement had pain. They initially thought it was fluid related because when she would sit up it would start feeling better. Has been having pain now at least a week. Rest resolves it. Felt better with pressure. Not better with sitting up as compared to laying down (other than one day). Not worse with exertion but does improve with rest. Pain episode at least every few days. No active pain today  Last seen by Dr. Percival Spanish in  2015. Echo 03/17/16: EF 63%, grade 1 diastolic. Mild LVH. Also had stress test through [facility] which was reporteldy low risk. Cath 2007.   EKG today: sinus rhythm, rate 79, normal axis, LVH noted (known from echocardiogram), do not see noted st depression. QT  inteval just above 1/2 the r r interval, 1st degree block also with pr 208.  A/P: 36 year old female with chest pain that is not worse with exertion but does tend to improve when resting. No other symptoms such as SOB, diaphoresis, left arm or neck pain. EKG reassuring/stable. We discussed would still advise cardiology follow up with Dr. Percival Spanish- she agrees to call. Doubt  PE with no leg swelling. Doubt aneurysm. Wonder about msk cause given she states better with pressure. Doubt CHF related given improvement in most recent echocardiogram and ongoing fluid adjustments with dialysis. Prior cardiology notes also note history of atypical chest pain (though this is the first time she has had any recently) which to me is reassuring   Also notes 2 minor issues 1. Left ear drainage- clear at times. hurts when blows nose at times up into ear.  2. Under arm itching. Try vaseline. Steroid as next step. No signs fungal. Slightly dry on exam. Would use topical antifungal if other options for treatment fail   Orders Placed This Encounter  Procedures  . EKG 12-Lead    Order Specific Question:   Where should this test be performed    Answer:   Other    Meds ordered this encounter  Medications  . LABETALOL HCL PO    Sig: Take 450 mg by mouth 2 (two) times daily.   The duration of face-to-face time during this visit was greater than 25 minutes. Greater than 50% of this time was spent in counseling about chest pain and potential causes, reviewing treatment and follow up if recurrent issues for ears and underarm issues  Return precautions advised.  Garret Reddish, MD

## 2016-11-27 NOTE — Assessment & Plan Note (Addendum)
S: feels some pressure in her chest starting at dialysis. About a week ago, on and off the rest of that day. If sitting no pain, if movement had pain. They initially thought it was fluid related because when she would sit up it would start feeling better. Has been having pain now at least a week. Rest resolves it. Felt better with pressure. Not better with sitting up as compared to laying down (other than one day). Not worse with exertion but does improve with rest. Pain episode at least every few days. No active pain today  Last seen by Dr. Percival Spanish in 2015. Echo 03/17/16: EF 72%, grade 1 diastolic. Mild LVH. Also had stress test through [facility] which was reporteldy low risk. Cath 2007.   EKG today: sinus rhythm, rate 79, normal axis, LVH noted (known from echocardiogram), do not see noted st depression. QT  inteval just above 1/2 the r r interval, 1st degree block also with pr 208.  A/P: 36 year old female with chest pain that is not worse with exertion but does tend to improve when resting. No other symptoms such as SOB, diaphoresis, left arm or neck pain. EKG reassuring/stable. We discussed would still advise cardiology follow up with Dr. Percival Spanish- she agrees to call. Doubt PE with no leg swelling. Doubt aneurysm. Wonder about msk cause given she states better with pressure. Doubt CHF related given improvement in most recent echocardiogram and ongoing fluid adjustments with dialysis. Prior cardiology notes also note history of atypical chest pain (though this is the first time she has had any recently) which to me is reassuring

## 2016-11-27 NOTE — Patient Instructions (Signed)
EKG primarily shows the mild enlargement of left side of heart which was already known  I do not think this is cardiac most likely. Chest wall pain?  I do think it would be reasonable to follow up with Dr. Percival Spanish - please call for appointment as already established to get his opinion.   For ear- happy to refer you to ENT but do not see any obvious issues other than what appears to be draining wax  For under arm itching. Try vaseline. If not effetive, I can call in a steroid cream. If that were to worsen itching- would consider fungal treatment but really no signs of fungus today

## 2017-05-22 ENCOUNTER — Ambulatory Visit (INDEPENDENT_AMBULATORY_CARE_PROVIDER_SITE_OTHER): Payer: BLUE CROSS/BLUE SHIELD | Admitting: Podiatry

## 2017-05-22 ENCOUNTER — Ambulatory Visit (INDEPENDENT_AMBULATORY_CARE_PROVIDER_SITE_OTHER): Payer: BLUE CROSS/BLUE SHIELD

## 2017-05-22 ENCOUNTER — Encounter: Payer: Self-pay | Admitting: Podiatry

## 2017-05-22 DIAGNOSIS — Q6689 Other  specified congenital deformities of feet: Secondary | ICD-10-CM

## 2017-05-22 DIAGNOSIS — M2011 Hallux valgus (acquired), right foot: Secondary | ICD-10-CM

## 2017-05-22 DIAGNOSIS — M722 Plantar fascial fibromatosis: Secondary | ICD-10-CM

## 2017-05-22 DIAGNOSIS — Q72899 Other reduction defects of unspecified lower limb: Secondary | ICD-10-CM

## 2017-05-23 NOTE — Progress Notes (Signed)
She presents today for chief complaint of heel pain right. She states and been has been bothering her for several weeks and seems to be getting worse. She is also concerned about a bunion deformity to the right foot states is becoming more painful and is interfering with her ability to perform her daily activities. She is also concerned about a short fourth metatarsal of the right foot. She would like to consider surgical intervention. She has a history of lupus and kidney failure associated with lupus and is currently on dialysis.  Objective: Vital signs are stable she is alert and oriented 3 she is in no apparent distress. Pulses are palpable bilateral. Neurologic sensorium is intact in tendon reflexes are intact muscle strength is symmetrical bilateral. Orthopedic evaluation does demonstrate breaking metatarsal and fourth right known the left. She has a prominent hypertrophic medial condyle to the head of the first metatarsal bilaterally very little in the way of lateral deviation of the hallux. She has pain on palpation medial calcaneal tubercle of the right heel. Minimally so on the left.  Radiographs taken today do demonstrate breaking metatarsal hypertrophic medial condyle as well as soft tissue increase in density at the plantar fascia calcaneal insertion site right over left.  Assessment: Mild hallux valgus deformities. Plantar fasciitis right foot.brachymetatarsia right foot.  Plan: Discussed etiology pathology and conservative therapies. At this point I injected the right heel today with Kenalog and local anesthetic placed in the plantar fascia brace and a night splint. Discussed appropriate shoe gear stretching exercises ice therapy shoe gear modifications. We also discussed surgical intervention regarding her right foot and over will refer her back to Dr. Jacqualyn Posey for surgery consideration.

## 2017-06-14 ENCOUNTER — Ambulatory Visit (INDEPENDENT_AMBULATORY_CARE_PROVIDER_SITE_OTHER): Payer: BLUE CROSS/BLUE SHIELD | Admitting: Podiatry

## 2017-06-14 ENCOUNTER — Encounter: Payer: Self-pay | Admitting: Podiatry

## 2017-06-14 VITALS — BP 119/73 | HR 81 | Resp 18

## 2017-06-14 DIAGNOSIS — M722 Plantar fascial fibromatosis: Secondary | ICD-10-CM

## 2017-06-14 DIAGNOSIS — M2011 Hallux valgus (acquired), right foot: Secondary | ICD-10-CM

## 2017-06-14 DIAGNOSIS — Q6689 Other  specified congenital deformities of feet: Secondary | ICD-10-CM

## 2017-06-14 DIAGNOSIS — Q72899 Other reduction defects of unspecified lower limb: Secondary | ICD-10-CM

## 2017-06-14 NOTE — Patient Instructions (Addendum)
You can look at Powersteps or Superfeet inserts for your shoes  Plantar Fasciitis Rehab Ask your health care provider which exercises are safe for you. Do exercises exactly as told by your health care provider and adjust them as directed. It is normal to feel mild stretching, pulling, tightness, or discomfort as you do these exercises, but you should stop right away if you feel sudden pain or your pain gets worse. Do not begin these exercises until told by your health care provider. Stretching and range of motion exercises These exercises warm up your muscles and joints and improve the movement and flexibility of your foot. These exercises also help to relieve pain. Exercise A: Plantar fascia stretch  1. Sit with your left / right leg crossed over your opposite knee. 2. Hold your heel with one hand with that thumb near your arch. With your other hand, hold your toes and gently pull them back toward the top of your foot. You should feel a stretch on the bottom of your toes or your foot or both. 3. Hold this stretch for__________ seconds. 4. Slowly release your toes and return to the starting position. Repeat __________ times. Complete this exercise __________ times a day. Exercise B: Gastroc, standing  1. Stand with your hands against a wall. 2. Extend your left / right leg behind you, and bend your front knee slightly. 3. Keeping your heels on the floor and keeping your back knee straight, shift your weight toward the wall without arching your back. You should feel a gentle stretch in your left / right calf. 4. Hold this position for __________ seconds. Repeat __________ times. Complete this exercise __________ times a day. Exercise C: Soleus, standing 1. Stand with your hands against a wall. 2. Extend your left / right leg behind you, and bend your front knee slightly. 3. Keeping your heels on the floor, bend your back knee and slightly shift your weight over the back leg. You should feel a  gentle stretch deep in your calf. 4. Hold this position for __________ seconds. Repeat __________ times. Complete this exercise __________ times a day. Exercise D: Gastrocsoleus, standing 1. Stand with the ball of your left / right foot on a step. The ball of your foot is on the walking surface, right under your toes. 2. Keep your other foot firmly on the same step. 3. Hold onto the wall or a railing for balance. 4. Slowly lift your other foot, allowing your body weight to press your heel down over the edge of the step. You should feel a stretch in your left / right calf. 5. Hold this position for __________ seconds. 6. Return both feet to the step. 7. Repeat this exercise with a slight bend in your left / right knee. Repeat __________ times with your left / right knee straight and __________ times with your left / right knee bent. Complete this exercise __________ times a day. Balance exercise This exercise builds your balance and strength control of your arch to help take pressure off your plantar fascia. Exercise E: Single leg stand 1. Without shoes, stand near a railing or in a doorway. You may hold onto the railing or door frame as needed. 2. Stand on your left / right foot. Keep your big toe down on the floor and try to keep your arch lifted. Do not let your foot roll inward. 3. Hold this position for __________ seconds. 4. If this exercise is too easy, you can try it with your  eyes closed or while standing on a pillow. Repeat __________ times. Complete this exercise __________ times a day. This information is not intended to replace advice given to you by your health care provider. Make sure you discuss any questions you have with your health care provider. Document Released: 10/09/2005 Document Revised: 06/13/2016 Document Reviewed: 08/23/2015 Elsevier Interactive Patient Education  2018 [employer].

## 2017-06-15 DIAGNOSIS — M2011 Hallux valgus (acquired), right foot: Secondary | ICD-10-CM | POA: Insufficient documentation

## 2017-06-15 DIAGNOSIS — M722 Plantar fascial fibromatosis: Secondary | ICD-10-CM | POA: Insufficient documentation

## 2017-06-15 DIAGNOSIS — Q72899 Other reduction defects of unspecified lower limb: Secondary | ICD-10-CM | POA: Insufficient documentation

## 2017-06-15 NOTE — Progress Notes (Signed)
Subjective: [name] [name] presents the office today for follow-up of ulceration right heel pain as well as to discuss possible surgical intervention for bunion as well as for a short fourth digit. She states the heel is doing much better. She states that she still gets pain along the bunion site which is been ongoing for last several weeks. She's had no significant treatment for the bunion she states. She says the fourth toe is been short her entire life and she does not really any pain to the area. She does notice that she is a little callus in the ball of her foot at times. She's had no treatment for this. She has no other concerns today. Denies any systemic complaints such as fevers, chills, nausea, vomiting. No acute changes since last appointment, and no other complaints at this time.   Objective: AAO x3, NAD DP/PT pulses palpable bilaterally, CRT less than 3 seconds There is moderate HAV present on the right foot. There is no pain or crepitation first MPJ range of motion is no first ray hypermobility. There is no skin breakdown along the bunion site. There is shortening fourth digit without any pain to the area. There is no significant hyperkeratotic tissue buildup. Is no other area tenderness identified to the feet. Is no pain to that heel on the plantar fascia at this time. There is no pain along the Achilles tendon which appears to be intact. No other areas of tenderness identified. No open lesions or pre-ulcerative lesions.  No pain with calf compression, swelling, warmth, erythema  Assessment: Resolved right heel pain with symptomatic bunion; brachymet 4th   Plan: -All treatment options discussed with the patient including all alternatives, risks, complications.  -Previous x-rays were discussed the patient. A long discussion regards to both conservative and surgical treatment options. This point she's had no conservative treatments are recommended continue with this also given her medical  history I am very hasn't had any surgical intervention at this point. I discussed the change in shoes and orthotics for her shoes. Also discussed possible injection as is not painful on palpation today but hold off on this to the bunion.She was really looking for surgical intervention today and I think she was upset when I did not want to do surgery for her breast-feeding this is the best given her medical conditions and she's had no conservative treatment. -Patient encouraged to call the office with any questions, concerns, change in symptoms.    Celesta Gentile, DPM

## 2017-07-02 LAB — HM PAP SMEAR
HM Pap smear: NEGATIVE
HM Pap smear: NEGATIVE

## 2017-07-04 ENCOUNTER — Encounter: Payer: Self-pay | Admitting: Family Medicine

## 2017-07-13 NOTE — Telephone Encounter (Signed)
error 

## 2017-11-23 DIAGNOSIS — N186 End stage renal disease: Secondary | ICD-10-CM | POA: Diagnosis not present

## 2017-11-23 DIAGNOSIS — Z992 Dependence on renal dialysis: Secondary | ICD-10-CM | POA: Diagnosis not present

## 2017-12-10 DIAGNOSIS — J111 Influenza due to unidentified influenza virus with other respiratory manifestations: Secondary | ICD-10-CM | POA: Diagnosis not present

## 2017-12-10 DIAGNOSIS — R110 Nausea: Secondary | ICD-10-CM | POA: Diagnosis not present

## 2017-12-21 DIAGNOSIS — N186 End stage renal disease: Secondary | ICD-10-CM | POA: Diagnosis not present

## 2017-12-21 DIAGNOSIS — Z992 Dependence on renal dialysis: Secondary | ICD-10-CM | POA: Diagnosis not present

## 2017-12-27 DIAGNOSIS — M3219 Other organ or system involvement in systemic lupus erythematosus: Secondary | ICD-10-CM | POA: Diagnosis not present

## 2017-12-27 DIAGNOSIS — N19 Unspecified kidney failure: Secondary | ICD-10-CM | POA: Diagnosis not present

## 2017-12-27 DIAGNOSIS — Z79899 Other long term (current) drug therapy: Secondary | ICD-10-CM | POA: Diagnosis not present

## 2017-12-28 DIAGNOSIS — R0789 Other chest pain: Secondary | ICD-10-CM | POA: Diagnosis not present

## 2017-12-28 DIAGNOSIS — I272 Pulmonary hypertension, unspecified: Secondary | ICD-10-CM | POA: Diagnosis not present

## 2017-12-28 DIAGNOSIS — I517 Cardiomegaly: Secondary | ICD-10-CM | POA: Diagnosis not present

## 2017-12-28 DIAGNOSIS — R079 Chest pain, unspecified: Secondary | ICD-10-CM | POA: Diagnosis not present

## 2017-12-28 DIAGNOSIS — M329 Systemic lupus erythematosus, unspecified: Secondary | ICD-10-CM | POA: Diagnosis not present

## 2018-01-08 DIAGNOSIS — R0602 Shortness of breath: Secondary | ICD-10-CM | POA: Diagnosis not present

## 2018-01-08 DIAGNOSIS — I517 Cardiomegaly: Secondary | ICD-10-CM | POA: Diagnosis not present

## 2018-01-10 DIAGNOSIS — R002 Palpitations: Secondary | ICD-10-CM | POA: Diagnosis not present

## 2018-01-10 DIAGNOSIS — I272 Pulmonary hypertension, unspecified: Secondary | ICD-10-CM | POA: Diagnosis not present

## 2018-01-10 DIAGNOSIS — I151 Hypertension secondary to other renal disorders: Secondary | ICD-10-CM | POA: Diagnosis not present

## 2018-01-10 DIAGNOSIS — N2889 Other specified disorders of kidney and ureter: Secondary | ICD-10-CM | POA: Diagnosis not present

## 2018-01-10 DIAGNOSIS — R0602 Shortness of breath: Secondary | ICD-10-CM | POA: Diagnosis not present

## 2018-01-10 DIAGNOSIS — I509 Heart failure, unspecified: Secondary | ICD-10-CM | POA: Diagnosis not present

## 2018-01-17 DIAGNOSIS — R0602 Shortness of breath: Secondary | ICD-10-CM | POA: Diagnosis not present

## 2018-01-17 DIAGNOSIS — R002 Palpitations: Secondary | ICD-10-CM | POA: Diagnosis not present

## 2018-01-17 DIAGNOSIS — I272 Pulmonary hypertension, unspecified: Secondary | ICD-10-CM | POA: Diagnosis not present

## 2018-01-17 DIAGNOSIS — I5089 Other heart failure: Secondary | ICD-10-CM | POA: Diagnosis not present

## 2018-01-21 DIAGNOSIS — Z992 Dependence on renal dialysis: Secondary | ICD-10-CM | POA: Diagnosis not present

## 2018-01-21 DIAGNOSIS — N186 End stage renal disease: Secondary | ICD-10-CM | POA: Diagnosis not present

## 2018-01-25 DIAGNOSIS — R002 Palpitations: Secondary | ICD-10-CM | POA: Diagnosis not present

## 2018-02-01 DIAGNOSIS — Z452 Encounter for adjustment and management of vascular access device: Secondary | ICD-10-CM | POA: Diagnosis not present

## 2018-02-01 DIAGNOSIS — I429 Cardiomyopathy, unspecified: Secondary | ICD-10-CM | POA: Diagnosis not present

## 2018-02-01 DIAGNOSIS — I132 Hypertensive heart and chronic kidney disease with heart failure and with stage 5 chronic kidney disease, or end stage renal disease: Secondary | ICD-10-CM | POA: Diagnosis not present

## 2018-02-01 DIAGNOSIS — I272 Pulmonary hypertension, unspecified: Secondary | ICD-10-CM | POA: Diagnosis not present

## 2018-02-01 DIAGNOSIS — N186 End stage renal disease: Secondary | ICD-10-CM | POA: Diagnosis not present

## 2018-02-01 DIAGNOSIS — R0602 Shortness of breath: Secondary | ICD-10-CM | POA: Diagnosis not present

## 2018-02-01 DIAGNOSIS — Z992 Dependence on renal dialysis: Secondary | ICD-10-CM | POA: Diagnosis not present

## 2018-02-01 DIAGNOSIS — I509 Heart failure, unspecified: Secondary | ICD-10-CM | POA: Diagnosis not present

## 2018-02-01 DIAGNOSIS — R002 Palpitations: Secondary | ICD-10-CM | POA: Diagnosis not present

## 2018-02-27 DIAGNOSIS — I272 Pulmonary hypertension, unspecified: Secondary | ICD-10-CM | POA: Diagnosis not present

## 2018-02-27 DIAGNOSIS — R600 Localized edema: Secondary | ICD-10-CM | POA: Diagnosis not present

## 2018-02-27 DIAGNOSIS — I509 Heart failure, unspecified: Secondary | ICD-10-CM | POA: Diagnosis not present

## 2018-02-27 DIAGNOSIS — I151 Hypertension secondary to other renal disorders: Secondary | ICD-10-CM | POA: Diagnosis not present

## 2018-02-27 DIAGNOSIS — R0602 Shortness of breath: Secondary | ICD-10-CM | POA: Diagnosis not present

## 2018-02-27 DIAGNOSIS — R002 Palpitations: Secondary | ICD-10-CM | POA: Diagnosis not present

## 2018-02-27 DIAGNOSIS — N2889 Other specified disorders of kidney and ureter: Secondary | ICD-10-CM | POA: Diagnosis not present

## 2018-03-04 DIAGNOSIS — R600 Localized edema: Secondary | ICD-10-CM | POA: Diagnosis not present

## 2018-03-04 DIAGNOSIS — M7989 Other specified soft tissue disorders: Secondary | ICD-10-CM | POA: Diagnosis not present

## 2018-03-23 DIAGNOSIS — N186 End stage renal disease: Secondary | ICD-10-CM | POA: Diagnosis not present

## 2018-03-23 DIAGNOSIS — Z992 Dependence on renal dialysis: Secondary | ICD-10-CM | POA: Diagnosis not present

## 2018-03-23 DIAGNOSIS — N2581 Secondary hyperparathyroidism of renal origin: Secondary | ICD-10-CM | POA: Diagnosis not present

## 2018-03-23 DIAGNOSIS — D509 Iron deficiency anemia, unspecified: Secondary | ICD-10-CM | POA: Diagnosis not present

## 2018-03-23 DIAGNOSIS — D631 Anemia in chronic kidney disease: Secondary | ICD-10-CM | POA: Diagnosis not present

## 2018-03-26 DIAGNOSIS — N2581 Secondary hyperparathyroidism of renal origin: Secondary | ICD-10-CM | POA: Diagnosis not present

## 2018-03-26 DIAGNOSIS — Z992 Dependence on renal dialysis: Secondary | ICD-10-CM | POA: Diagnosis not present

## 2018-03-26 DIAGNOSIS — D509 Iron deficiency anemia, unspecified: Secondary | ICD-10-CM | POA: Diagnosis not present

## 2018-03-26 DIAGNOSIS — N186 End stage renal disease: Secondary | ICD-10-CM | POA: Diagnosis not present

## 2018-03-26 DIAGNOSIS — D631 Anemia in chronic kidney disease: Secondary | ICD-10-CM | POA: Diagnosis not present

## 2018-03-27 DIAGNOSIS — M3219 Other organ or system involvement in systemic lupus erythematosus: Secondary | ICD-10-CM | POA: Diagnosis not present

## 2018-03-27 DIAGNOSIS — Z79899 Other long term (current) drug therapy: Secondary | ICD-10-CM | POA: Diagnosis not present

## 2018-03-28 DIAGNOSIS — D509 Iron deficiency anemia, unspecified: Secondary | ICD-10-CM | POA: Diagnosis not present

## 2018-03-28 DIAGNOSIS — N186 End stage renal disease: Secondary | ICD-10-CM | POA: Diagnosis not present

## 2018-03-28 DIAGNOSIS — N2581 Secondary hyperparathyroidism of renal origin: Secondary | ICD-10-CM | POA: Diagnosis not present

## 2018-03-28 DIAGNOSIS — D631 Anemia in chronic kidney disease: Secondary | ICD-10-CM | POA: Diagnosis not present

## 2018-03-28 DIAGNOSIS — Z992 Dependence on renal dialysis: Secondary | ICD-10-CM | POA: Diagnosis not present

## 2018-03-29 DIAGNOSIS — M722 Plantar fascial fibromatosis: Secondary | ICD-10-CM | POA: Diagnosis not present

## 2018-03-30 DIAGNOSIS — Z992 Dependence on renal dialysis: Secondary | ICD-10-CM | POA: Diagnosis not present

## 2018-03-30 DIAGNOSIS — D509 Iron deficiency anemia, unspecified: Secondary | ICD-10-CM | POA: Diagnosis not present

## 2018-03-30 DIAGNOSIS — D631 Anemia in chronic kidney disease: Secondary | ICD-10-CM | POA: Diagnosis not present

## 2018-03-30 DIAGNOSIS — N2581 Secondary hyperparathyroidism of renal origin: Secondary | ICD-10-CM | POA: Diagnosis not present

## 2018-03-30 DIAGNOSIS — N186 End stage renal disease: Secondary | ICD-10-CM | POA: Diagnosis not present

## 2018-04-02 DIAGNOSIS — Z992 Dependence on renal dialysis: Secondary | ICD-10-CM | POA: Diagnosis not present

## 2018-04-02 DIAGNOSIS — D631 Anemia in chronic kidney disease: Secondary | ICD-10-CM | POA: Diagnosis not present

## 2018-04-02 DIAGNOSIS — N186 End stage renal disease: Secondary | ICD-10-CM | POA: Diagnosis not present

## 2018-04-02 DIAGNOSIS — N2581 Secondary hyperparathyroidism of renal origin: Secondary | ICD-10-CM | POA: Diagnosis not present

## 2018-04-02 DIAGNOSIS — D509 Iron deficiency anemia, unspecified: Secondary | ICD-10-CM | POA: Diagnosis not present

## 2018-04-03 DIAGNOSIS — M3219 Other organ or system involvement in systemic lupus erythematosus: Secondary | ICD-10-CM | POA: Diagnosis not present

## 2018-04-03 DIAGNOSIS — N19 Unspecified kidney failure: Secondary | ICD-10-CM | POA: Diagnosis not present

## 2018-04-03 DIAGNOSIS — I272 Pulmonary hypertension, unspecified: Secondary | ICD-10-CM | POA: Diagnosis not present

## 2018-04-04 DIAGNOSIS — D509 Iron deficiency anemia, unspecified: Secondary | ICD-10-CM | POA: Diagnosis not present

## 2018-04-04 DIAGNOSIS — N186 End stage renal disease: Secondary | ICD-10-CM | POA: Diagnosis not present

## 2018-04-04 DIAGNOSIS — Z992 Dependence on renal dialysis: Secondary | ICD-10-CM | POA: Diagnosis not present

## 2018-04-04 DIAGNOSIS — D631 Anemia in chronic kidney disease: Secondary | ICD-10-CM | POA: Diagnosis not present

## 2018-04-04 DIAGNOSIS — N2581 Secondary hyperparathyroidism of renal origin: Secondary | ICD-10-CM | POA: Diagnosis not present

## 2018-04-05 DIAGNOSIS — I272 Pulmonary hypertension, unspecified: Secondary | ICD-10-CM | POA: Diagnosis not present

## 2018-04-06 DIAGNOSIS — N2581 Secondary hyperparathyroidism of renal origin: Secondary | ICD-10-CM | POA: Diagnosis not present

## 2018-04-06 DIAGNOSIS — Z992 Dependence on renal dialysis: Secondary | ICD-10-CM | POA: Diagnosis not present

## 2018-04-06 DIAGNOSIS — N186 End stage renal disease: Secondary | ICD-10-CM | POA: Diagnosis not present

## 2018-04-06 DIAGNOSIS — D509 Iron deficiency anemia, unspecified: Secondary | ICD-10-CM | POA: Diagnosis not present

## 2018-04-06 DIAGNOSIS — D631 Anemia in chronic kidney disease: Secondary | ICD-10-CM | POA: Diagnosis not present

## 2018-04-09 DIAGNOSIS — N2581 Secondary hyperparathyroidism of renal origin: Secondary | ICD-10-CM | POA: Diagnosis not present

## 2018-04-09 DIAGNOSIS — D631 Anemia in chronic kidney disease: Secondary | ICD-10-CM | POA: Diagnosis not present

## 2018-04-09 DIAGNOSIS — N186 End stage renal disease: Secondary | ICD-10-CM | POA: Diagnosis not present

## 2018-04-09 DIAGNOSIS — D509 Iron deficiency anemia, unspecified: Secondary | ICD-10-CM | POA: Diagnosis not present

## 2018-04-09 DIAGNOSIS — Z992 Dependence on renal dialysis: Secondary | ICD-10-CM | POA: Diagnosis not present

## 2018-04-11 DIAGNOSIS — D509 Iron deficiency anemia, unspecified: Secondary | ICD-10-CM | POA: Diagnosis not present

## 2018-04-11 DIAGNOSIS — D631 Anemia in chronic kidney disease: Secondary | ICD-10-CM | POA: Diagnosis not present

## 2018-04-11 DIAGNOSIS — N186 End stage renal disease: Secondary | ICD-10-CM | POA: Diagnosis not present

## 2018-04-11 DIAGNOSIS — Z992 Dependence on renal dialysis: Secondary | ICD-10-CM | POA: Diagnosis not present

## 2018-04-11 DIAGNOSIS — N2581 Secondary hyperparathyroidism of renal origin: Secondary | ICD-10-CM | POA: Diagnosis not present

## 2018-04-13 DIAGNOSIS — N186 End stage renal disease: Secondary | ICD-10-CM | POA: Diagnosis not present

## 2018-04-13 DIAGNOSIS — D631 Anemia in chronic kidney disease: Secondary | ICD-10-CM | POA: Diagnosis not present

## 2018-04-13 DIAGNOSIS — D509 Iron deficiency anemia, unspecified: Secondary | ICD-10-CM | POA: Diagnosis not present

## 2018-04-13 DIAGNOSIS — Z992 Dependence on renal dialysis: Secondary | ICD-10-CM | POA: Diagnosis not present

## 2018-04-13 DIAGNOSIS — N2581 Secondary hyperparathyroidism of renal origin: Secondary | ICD-10-CM | POA: Diagnosis not present

## 2018-04-16 DIAGNOSIS — Z992 Dependence on renal dialysis: Secondary | ICD-10-CM | POA: Diagnosis not present

## 2018-04-16 DIAGNOSIS — D631 Anemia in chronic kidney disease: Secondary | ICD-10-CM | POA: Diagnosis not present

## 2018-04-16 DIAGNOSIS — D509 Iron deficiency anemia, unspecified: Secondary | ICD-10-CM | POA: Diagnosis not present

## 2018-04-16 DIAGNOSIS — N2581 Secondary hyperparathyroidism of renal origin: Secondary | ICD-10-CM | POA: Diagnosis not present

## 2018-04-16 DIAGNOSIS — N186 End stage renal disease: Secondary | ICD-10-CM | POA: Diagnosis not present

## 2018-04-18 DIAGNOSIS — Z992 Dependence on renal dialysis: Secondary | ICD-10-CM | POA: Diagnosis not present

## 2018-04-18 DIAGNOSIS — D631 Anemia in chronic kidney disease: Secondary | ICD-10-CM | POA: Diagnosis not present

## 2018-04-18 DIAGNOSIS — N186 End stage renal disease: Secondary | ICD-10-CM | POA: Diagnosis not present

## 2018-04-18 DIAGNOSIS — N2581 Secondary hyperparathyroidism of renal origin: Secondary | ICD-10-CM | POA: Diagnosis not present

## 2018-04-18 DIAGNOSIS — D509 Iron deficiency anemia, unspecified: Secondary | ICD-10-CM | POA: Diagnosis not present

## 2018-04-18 DIAGNOSIS — I272 Pulmonary hypertension, unspecified: Secondary | ICD-10-CM | POA: Diagnosis not present

## 2018-04-20 DIAGNOSIS — D631 Anemia in chronic kidney disease: Secondary | ICD-10-CM | POA: Diagnosis not present

## 2018-04-20 DIAGNOSIS — Z992 Dependence on renal dialysis: Secondary | ICD-10-CM | POA: Diagnosis not present

## 2018-04-20 DIAGNOSIS — D509 Iron deficiency anemia, unspecified: Secondary | ICD-10-CM | POA: Diagnosis not present

## 2018-04-20 DIAGNOSIS — N186 End stage renal disease: Secondary | ICD-10-CM | POA: Diagnosis not present

## 2018-04-20 DIAGNOSIS — N2581 Secondary hyperparathyroidism of renal origin: Secondary | ICD-10-CM | POA: Diagnosis not present

## 2018-04-22 DIAGNOSIS — Z992 Dependence on renal dialysis: Secondary | ICD-10-CM | POA: Diagnosis not present

## 2018-04-22 DIAGNOSIS — N186 End stage renal disease: Secondary | ICD-10-CM | POA: Diagnosis not present

## 2018-04-23 DIAGNOSIS — Z992 Dependence on renal dialysis: Secondary | ICD-10-CM | POA: Diagnosis not present

## 2018-04-23 DIAGNOSIS — N2581 Secondary hyperparathyroidism of renal origin: Secondary | ICD-10-CM | POA: Diagnosis not present

## 2018-04-23 DIAGNOSIS — D631 Anemia in chronic kidney disease: Secondary | ICD-10-CM | POA: Diagnosis not present

## 2018-04-23 DIAGNOSIS — N186 End stage renal disease: Secondary | ICD-10-CM | POA: Diagnosis not present

## 2018-04-23 DIAGNOSIS — D509 Iron deficiency anemia, unspecified: Secondary | ICD-10-CM | POA: Diagnosis not present

## 2018-04-23 DIAGNOSIS — R110 Nausea: Secondary | ICD-10-CM | POA: Diagnosis not present

## 2018-04-25 DIAGNOSIS — N186 End stage renal disease: Secondary | ICD-10-CM | POA: Diagnosis not present

## 2018-04-25 DIAGNOSIS — Z992 Dependence on renal dialysis: Secondary | ICD-10-CM | POA: Diagnosis not present

## 2018-04-25 DIAGNOSIS — R110 Nausea: Secondary | ICD-10-CM | POA: Diagnosis not present

## 2018-04-25 DIAGNOSIS — N2581 Secondary hyperparathyroidism of renal origin: Secondary | ICD-10-CM | POA: Diagnosis not present

## 2018-04-25 DIAGNOSIS — D509 Iron deficiency anemia, unspecified: Secondary | ICD-10-CM | POA: Diagnosis not present

## 2018-04-25 DIAGNOSIS — D631 Anemia in chronic kidney disease: Secondary | ICD-10-CM | POA: Diagnosis not present

## 2018-04-27 DIAGNOSIS — R110 Nausea: Secondary | ICD-10-CM | POA: Diagnosis not present

## 2018-04-27 DIAGNOSIS — N186 End stage renal disease: Secondary | ICD-10-CM | POA: Diagnosis not present

## 2018-04-27 DIAGNOSIS — D509 Iron deficiency anemia, unspecified: Secondary | ICD-10-CM | POA: Diagnosis not present

## 2018-04-27 DIAGNOSIS — N2581 Secondary hyperparathyroidism of renal origin: Secondary | ICD-10-CM | POA: Diagnosis not present

## 2018-04-27 DIAGNOSIS — Z992 Dependence on renal dialysis: Secondary | ICD-10-CM | POA: Diagnosis not present

## 2018-04-27 DIAGNOSIS — D631 Anemia in chronic kidney disease: Secondary | ICD-10-CM | POA: Diagnosis not present

## 2018-04-30 DIAGNOSIS — D509 Iron deficiency anemia, unspecified: Secondary | ICD-10-CM | POA: Diagnosis not present

## 2018-04-30 DIAGNOSIS — R110 Nausea: Secondary | ICD-10-CM | POA: Diagnosis not present

## 2018-04-30 DIAGNOSIS — Z992 Dependence on renal dialysis: Secondary | ICD-10-CM | POA: Diagnosis not present

## 2018-04-30 DIAGNOSIS — N186 End stage renal disease: Secondary | ICD-10-CM | POA: Diagnosis not present

## 2018-04-30 DIAGNOSIS — N2581 Secondary hyperparathyroidism of renal origin: Secondary | ICD-10-CM | POA: Diagnosis not present

## 2018-04-30 DIAGNOSIS — D631 Anemia in chronic kidney disease: Secondary | ICD-10-CM | POA: Diagnosis not present

## 2018-05-02 DIAGNOSIS — D509 Iron deficiency anemia, unspecified: Secondary | ICD-10-CM | POA: Diagnosis not present

## 2018-05-02 DIAGNOSIS — N186 End stage renal disease: Secondary | ICD-10-CM | POA: Diagnosis not present

## 2018-05-02 DIAGNOSIS — R110 Nausea: Secondary | ICD-10-CM | POA: Diagnosis not present

## 2018-05-02 DIAGNOSIS — N2581 Secondary hyperparathyroidism of renal origin: Secondary | ICD-10-CM | POA: Diagnosis not present

## 2018-05-02 DIAGNOSIS — Z992 Dependence on renal dialysis: Secondary | ICD-10-CM | POA: Diagnosis not present

## 2018-05-02 DIAGNOSIS — D631 Anemia in chronic kidney disease: Secondary | ICD-10-CM | POA: Diagnosis not present

## 2018-05-04 DIAGNOSIS — N2581 Secondary hyperparathyroidism of renal origin: Secondary | ICD-10-CM | POA: Diagnosis not present

## 2018-05-04 DIAGNOSIS — D631 Anemia in chronic kidney disease: Secondary | ICD-10-CM | POA: Diagnosis not present

## 2018-05-04 DIAGNOSIS — N186 End stage renal disease: Secondary | ICD-10-CM | POA: Diagnosis not present

## 2018-05-04 DIAGNOSIS — D509 Iron deficiency anemia, unspecified: Secondary | ICD-10-CM | POA: Diagnosis not present

## 2018-05-04 DIAGNOSIS — R110 Nausea: Secondary | ICD-10-CM | POA: Diagnosis not present

## 2018-05-04 DIAGNOSIS — Z992 Dependence on renal dialysis: Secondary | ICD-10-CM | POA: Diagnosis not present

## 2018-05-07 DIAGNOSIS — R110 Nausea: Secondary | ICD-10-CM | POA: Diagnosis not present

## 2018-05-07 DIAGNOSIS — D509 Iron deficiency anemia, unspecified: Secondary | ICD-10-CM | POA: Diagnosis not present

## 2018-05-07 DIAGNOSIS — N186 End stage renal disease: Secondary | ICD-10-CM | POA: Diagnosis not present

## 2018-05-07 DIAGNOSIS — D631 Anemia in chronic kidney disease: Secondary | ICD-10-CM | POA: Diagnosis not present

## 2018-05-07 DIAGNOSIS — N2581 Secondary hyperparathyroidism of renal origin: Secondary | ICD-10-CM | POA: Diagnosis not present

## 2018-05-07 DIAGNOSIS — Z992 Dependence on renal dialysis: Secondary | ICD-10-CM | POA: Diagnosis not present

## 2018-05-09 DIAGNOSIS — R110 Nausea: Secondary | ICD-10-CM | POA: Diagnosis not present

## 2018-05-09 DIAGNOSIS — D509 Iron deficiency anemia, unspecified: Secondary | ICD-10-CM | POA: Diagnosis not present

## 2018-05-09 DIAGNOSIS — N2581 Secondary hyperparathyroidism of renal origin: Secondary | ICD-10-CM | POA: Diagnosis not present

## 2018-05-09 DIAGNOSIS — N186 End stage renal disease: Secondary | ICD-10-CM | POA: Diagnosis not present

## 2018-05-09 DIAGNOSIS — D631 Anemia in chronic kidney disease: Secondary | ICD-10-CM | POA: Diagnosis not present

## 2018-05-09 DIAGNOSIS — Z992 Dependence on renal dialysis: Secondary | ICD-10-CM | POA: Diagnosis not present

## 2018-05-11 DIAGNOSIS — D631 Anemia in chronic kidney disease: Secondary | ICD-10-CM | POA: Diagnosis not present

## 2018-05-11 DIAGNOSIS — Z992 Dependence on renal dialysis: Secondary | ICD-10-CM | POA: Diagnosis not present

## 2018-05-11 DIAGNOSIS — N186 End stage renal disease: Secondary | ICD-10-CM | POA: Diagnosis not present

## 2018-05-11 DIAGNOSIS — N2581 Secondary hyperparathyroidism of renal origin: Secondary | ICD-10-CM | POA: Diagnosis not present

## 2018-05-11 DIAGNOSIS — D509 Iron deficiency anemia, unspecified: Secondary | ICD-10-CM | POA: Diagnosis not present

## 2018-05-11 DIAGNOSIS — R110 Nausea: Secondary | ICD-10-CM | POA: Diagnosis not present

## 2018-05-14 DIAGNOSIS — N2581 Secondary hyperparathyroidism of renal origin: Secondary | ICD-10-CM | POA: Diagnosis not present

## 2018-05-14 DIAGNOSIS — Z992 Dependence on renal dialysis: Secondary | ICD-10-CM | POA: Diagnosis not present

## 2018-05-14 DIAGNOSIS — N186 End stage renal disease: Secondary | ICD-10-CM | POA: Diagnosis not present

## 2018-05-14 DIAGNOSIS — R110 Nausea: Secondary | ICD-10-CM | POA: Diagnosis not present

## 2018-05-14 DIAGNOSIS — D631 Anemia in chronic kidney disease: Secondary | ICD-10-CM | POA: Diagnosis not present

## 2018-05-14 DIAGNOSIS — D509 Iron deficiency anemia, unspecified: Secondary | ICD-10-CM | POA: Diagnosis not present

## 2018-05-16 DIAGNOSIS — D509 Iron deficiency anemia, unspecified: Secondary | ICD-10-CM | POA: Diagnosis not present

## 2018-05-16 DIAGNOSIS — D631 Anemia in chronic kidney disease: Secondary | ICD-10-CM | POA: Diagnosis not present

## 2018-05-16 DIAGNOSIS — Z992 Dependence on renal dialysis: Secondary | ICD-10-CM | POA: Diagnosis not present

## 2018-05-16 DIAGNOSIS — N186 End stage renal disease: Secondary | ICD-10-CM | POA: Diagnosis not present

## 2018-05-16 DIAGNOSIS — R110 Nausea: Secondary | ICD-10-CM | POA: Diagnosis not present

## 2018-05-16 DIAGNOSIS — N2581 Secondary hyperparathyroidism of renal origin: Secondary | ICD-10-CM | POA: Diagnosis not present

## 2018-05-18 DIAGNOSIS — D631 Anemia in chronic kidney disease: Secondary | ICD-10-CM | POA: Diagnosis not present

## 2018-05-18 DIAGNOSIS — N186 End stage renal disease: Secondary | ICD-10-CM | POA: Diagnosis not present

## 2018-05-18 DIAGNOSIS — Z992 Dependence on renal dialysis: Secondary | ICD-10-CM | POA: Diagnosis not present

## 2018-05-18 DIAGNOSIS — D509 Iron deficiency anemia, unspecified: Secondary | ICD-10-CM | POA: Diagnosis not present

## 2018-05-18 DIAGNOSIS — N2581 Secondary hyperparathyroidism of renal origin: Secondary | ICD-10-CM | POA: Diagnosis not present

## 2018-05-18 DIAGNOSIS — R110 Nausea: Secondary | ICD-10-CM | POA: Diagnosis not present

## 2018-05-21 DIAGNOSIS — R110 Nausea: Secondary | ICD-10-CM | POA: Diagnosis not present

## 2018-05-21 DIAGNOSIS — N186 End stage renal disease: Secondary | ICD-10-CM | POA: Diagnosis not present

## 2018-05-21 DIAGNOSIS — D509 Iron deficiency anemia, unspecified: Secondary | ICD-10-CM | POA: Diagnosis not present

## 2018-05-21 DIAGNOSIS — D631 Anemia in chronic kidney disease: Secondary | ICD-10-CM | POA: Diagnosis not present

## 2018-05-21 DIAGNOSIS — N2581 Secondary hyperparathyroidism of renal origin: Secondary | ICD-10-CM | POA: Diagnosis not present

## 2018-05-21 DIAGNOSIS — Z992 Dependence on renal dialysis: Secondary | ICD-10-CM | POA: Diagnosis not present

## 2018-05-23 DIAGNOSIS — Z992 Dependence on renal dialysis: Secondary | ICD-10-CM | POA: Diagnosis not present

## 2018-05-23 DIAGNOSIS — N186 End stage renal disease: Secondary | ICD-10-CM | POA: Diagnosis not present

## 2018-05-23 DIAGNOSIS — D631 Anemia in chronic kidney disease: Secondary | ICD-10-CM | POA: Diagnosis not present

## 2018-05-23 DIAGNOSIS — N2581 Secondary hyperparathyroidism of renal origin: Secondary | ICD-10-CM | POA: Diagnosis not present

## 2018-05-23 DIAGNOSIS — I158 Other secondary hypertension: Secondary | ICD-10-CM | POA: Diagnosis not present

## 2018-05-24 DIAGNOSIS — Z992 Dependence on renal dialysis: Secondary | ICD-10-CM | POA: Diagnosis not present

## 2018-05-24 DIAGNOSIS — N186 End stage renal disease: Secondary | ICD-10-CM | POA: Diagnosis not present

## 2018-05-25 DIAGNOSIS — N186 End stage renal disease: Secondary | ICD-10-CM | POA: Diagnosis not present

## 2018-05-25 DIAGNOSIS — Z992 Dependence on renal dialysis: Secondary | ICD-10-CM | POA: Diagnosis not present

## 2018-05-25 DIAGNOSIS — N2581 Secondary hyperparathyroidism of renal origin: Secondary | ICD-10-CM | POA: Diagnosis not present

## 2018-05-25 DIAGNOSIS — D631 Anemia in chronic kidney disease: Secondary | ICD-10-CM | POA: Diagnosis not present

## 2018-05-26 DIAGNOSIS — N186 End stage renal disease: Secondary | ICD-10-CM | POA: Diagnosis not present

## 2018-05-26 DIAGNOSIS — Z992 Dependence on renal dialysis: Secondary | ICD-10-CM | POA: Diagnosis not present

## 2018-05-27 DIAGNOSIS — N186 End stage renal disease: Secondary | ICD-10-CM | POA: Diagnosis not present

## 2018-05-27 DIAGNOSIS — Z992 Dependence on renal dialysis: Secondary | ICD-10-CM | POA: Diagnosis not present

## 2018-05-28 DIAGNOSIS — N2581 Secondary hyperparathyroidism of renal origin: Secondary | ICD-10-CM | POA: Diagnosis not present

## 2018-05-28 DIAGNOSIS — N186 End stage renal disease: Secondary | ICD-10-CM | POA: Diagnosis not present

## 2018-05-28 DIAGNOSIS — D631 Anemia in chronic kidney disease: Secondary | ICD-10-CM | POA: Diagnosis not present

## 2018-05-28 DIAGNOSIS — Z1159 Encounter for screening for other viral diseases: Secondary | ICD-10-CM | POA: Diagnosis not present

## 2018-05-28 DIAGNOSIS — Z992 Dependence on renal dialysis: Secondary | ICD-10-CM | POA: Diagnosis not present

## 2018-05-29 DIAGNOSIS — Z992 Dependence on renal dialysis: Secondary | ICD-10-CM | POA: Diagnosis not present

## 2018-05-29 DIAGNOSIS — N186 End stage renal disease: Secondary | ICD-10-CM | POA: Diagnosis not present

## 2018-05-30 DIAGNOSIS — Z992 Dependence on renal dialysis: Secondary | ICD-10-CM | POA: Diagnosis not present

## 2018-05-30 DIAGNOSIS — N2581 Secondary hyperparathyroidism of renal origin: Secondary | ICD-10-CM | POA: Diagnosis not present

## 2018-05-30 DIAGNOSIS — D631 Anemia in chronic kidney disease: Secondary | ICD-10-CM | POA: Diagnosis not present

## 2018-05-30 DIAGNOSIS — N186 End stage renal disease: Secondary | ICD-10-CM | POA: Diagnosis not present

## 2018-05-31 DIAGNOSIS — N186 End stage renal disease: Secondary | ICD-10-CM | POA: Diagnosis not present

## 2018-05-31 DIAGNOSIS — I272 Pulmonary hypertension, unspecified: Secondary | ICD-10-CM | POA: Diagnosis not present

## 2018-05-31 DIAGNOSIS — Z992 Dependence on renal dialysis: Secondary | ICD-10-CM | POA: Diagnosis not present

## 2018-05-31 DIAGNOSIS — N2581 Secondary hyperparathyroidism of renal origin: Secondary | ICD-10-CM | POA: Diagnosis not present

## 2018-05-31 DIAGNOSIS — M3219 Other organ or system involvement in systemic lupus erythematosus: Secondary | ICD-10-CM | POA: Diagnosis not present

## 2018-05-31 DIAGNOSIS — Z79899 Other long term (current) drug therapy: Secondary | ICD-10-CM | POA: Diagnosis not present

## 2018-05-31 DIAGNOSIS — I120 Hypertensive chronic kidney disease with stage 5 chronic kidney disease or end stage renal disease: Secondary | ICD-10-CM | POA: Diagnosis not present

## 2018-05-31 DIAGNOSIS — D631 Anemia in chronic kidney disease: Secondary | ICD-10-CM | POA: Diagnosis not present

## 2018-05-31 DIAGNOSIS — N19 Unspecified kidney failure: Secondary | ICD-10-CM | POA: Diagnosis not present

## 2018-06-01 DIAGNOSIS — D631 Anemia in chronic kidney disease: Secondary | ICD-10-CM | POA: Diagnosis not present

## 2018-06-01 DIAGNOSIS — Z992 Dependence on renal dialysis: Secondary | ICD-10-CM | POA: Diagnosis not present

## 2018-06-01 DIAGNOSIS — N186 End stage renal disease: Secondary | ICD-10-CM | POA: Diagnosis not present

## 2018-06-01 DIAGNOSIS — N2581 Secondary hyperparathyroidism of renal origin: Secondary | ICD-10-CM | POA: Diagnosis not present

## 2018-06-03 DIAGNOSIS — N2581 Secondary hyperparathyroidism of renal origin: Secondary | ICD-10-CM | POA: Diagnosis not present

## 2018-06-03 DIAGNOSIS — N186 End stage renal disease: Secondary | ICD-10-CM | POA: Diagnosis not present

## 2018-06-03 DIAGNOSIS — D649 Anemia, unspecified: Secondary | ICD-10-CM | POA: Diagnosis not present

## 2018-06-03 DIAGNOSIS — E611 Iron deficiency: Secondary | ICD-10-CM | POA: Diagnosis not present

## 2018-06-05 DIAGNOSIS — D649 Anemia, unspecified: Secondary | ICD-10-CM | POA: Diagnosis not present

## 2018-06-05 DIAGNOSIS — N186 End stage renal disease: Secondary | ICD-10-CM | POA: Diagnosis not present

## 2018-06-05 DIAGNOSIS — E611 Iron deficiency: Secondary | ICD-10-CM | POA: Diagnosis not present

## 2018-06-05 DIAGNOSIS — N2581 Secondary hyperparathyroidism of renal origin: Secondary | ICD-10-CM | POA: Diagnosis not present

## 2018-06-07 DIAGNOSIS — E611 Iron deficiency: Secondary | ICD-10-CM | POA: Diagnosis not present

## 2018-06-07 DIAGNOSIS — N186 End stage renal disease: Secondary | ICD-10-CM | POA: Diagnosis not present

## 2018-06-07 DIAGNOSIS — D649 Anemia, unspecified: Secondary | ICD-10-CM | POA: Diagnosis not present

## 2018-06-07 DIAGNOSIS — N2581 Secondary hyperparathyroidism of renal origin: Secondary | ICD-10-CM | POA: Diagnosis not present

## 2018-06-10 DIAGNOSIS — N2581 Secondary hyperparathyroidism of renal origin: Secondary | ICD-10-CM | POA: Diagnosis not present

## 2018-06-10 DIAGNOSIS — E611 Iron deficiency: Secondary | ICD-10-CM | POA: Diagnosis not present

## 2018-06-10 DIAGNOSIS — D649 Anemia, unspecified: Secondary | ICD-10-CM | POA: Diagnosis not present

## 2018-06-10 DIAGNOSIS — N186 End stage renal disease: Secondary | ICD-10-CM | POA: Diagnosis not present

## 2018-06-12 DIAGNOSIS — N2581 Secondary hyperparathyroidism of renal origin: Secondary | ICD-10-CM | POA: Diagnosis not present

## 2018-06-12 DIAGNOSIS — D649 Anemia, unspecified: Secondary | ICD-10-CM | POA: Diagnosis not present

## 2018-06-12 DIAGNOSIS — N186 End stage renal disease: Secondary | ICD-10-CM | POA: Diagnosis not present

## 2018-06-12 DIAGNOSIS — E611 Iron deficiency: Secondary | ICD-10-CM | POA: Diagnosis not present

## 2018-06-14 DIAGNOSIS — D649 Anemia, unspecified: Secondary | ICD-10-CM | POA: Diagnosis not present

## 2018-06-14 DIAGNOSIS — N2581 Secondary hyperparathyroidism of renal origin: Secondary | ICD-10-CM | POA: Diagnosis not present

## 2018-06-14 DIAGNOSIS — N186 End stage renal disease: Secondary | ICD-10-CM | POA: Diagnosis not present

## 2018-06-14 DIAGNOSIS — E611 Iron deficiency: Secondary | ICD-10-CM | POA: Diagnosis not present

## 2018-06-17 DIAGNOSIS — N186 End stage renal disease: Secondary | ICD-10-CM | POA: Diagnosis not present

## 2018-06-17 DIAGNOSIS — N2581 Secondary hyperparathyroidism of renal origin: Secondary | ICD-10-CM | POA: Diagnosis not present

## 2018-06-17 DIAGNOSIS — E611 Iron deficiency: Secondary | ICD-10-CM | POA: Diagnosis not present

## 2018-06-17 DIAGNOSIS — D649 Anemia, unspecified: Secondary | ICD-10-CM | POA: Diagnosis not present

## 2018-06-19 DIAGNOSIS — D649 Anemia, unspecified: Secondary | ICD-10-CM | POA: Diagnosis not present

## 2018-06-19 DIAGNOSIS — E611 Iron deficiency: Secondary | ICD-10-CM | POA: Diagnosis not present

## 2018-06-19 DIAGNOSIS — N2581 Secondary hyperparathyroidism of renal origin: Secondary | ICD-10-CM | POA: Diagnosis not present

## 2018-06-19 DIAGNOSIS — N186 End stage renal disease: Secondary | ICD-10-CM | POA: Diagnosis not present

## 2018-06-21 DIAGNOSIS — D649 Anemia, unspecified: Secondary | ICD-10-CM | POA: Diagnosis not present

## 2018-06-21 DIAGNOSIS — N186 End stage renal disease: Secondary | ICD-10-CM | POA: Diagnosis not present

## 2018-06-21 DIAGNOSIS — N2581 Secondary hyperparathyroidism of renal origin: Secondary | ICD-10-CM | POA: Diagnosis not present

## 2018-06-21 DIAGNOSIS — E611 Iron deficiency: Secondary | ICD-10-CM | POA: Diagnosis not present

## 2018-06-23 DIAGNOSIS — N186 End stage renal disease: Secondary | ICD-10-CM | POA: Diagnosis not present

## 2018-06-23 DIAGNOSIS — Z992 Dependence on renal dialysis: Secondary | ICD-10-CM | POA: Diagnosis not present

## 2018-06-23 DIAGNOSIS — I158 Other secondary hypertension: Secondary | ICD-10-CM | POA: Diagnosis not present

## 2018-06-24 DIAGNOSIS — D649 Anemia, unspecified: Secondary | ICD-10-CM | POA: Diagnosis not present

## 2018-06-24 DIAGNOSIS — E611 Iron deficiency: Secondary | ICD-10-CM | POA: Diagnosis not present

## 2018-06-24 DIAGNOSIS — N186 End stage renal disease: Secondary | ICD-10-CM | POA: Diagnosis not present

## 2018-06-24 DIAGNOSIS — N2581 Secondary hyperparathyroidism of renal origin: Secondary | ICD-10-CM | POA: Diagnosis not present

## 2018-06-26 DIAGNOSIS — D649 Anemia, unspecified: Secondary | ICD-10-CM | POA: Diagnosis not present

## 2018-06-26 DIAGNOSIS — N2581 Secondary hyperparathyroidism of renal origin: Secondary | ICD-10-CM | POA: Diagnosis not present

## 2018-06-26 DIAGNOSIS — E611 Iron deficiency: Secondary | ICD-10-CM | POA: Diagnosis not present

## 2018-06-26 DIAGNOSIS — N186 End stage renal disease: Secondary | ICD-10-CM | POA: Diagnosis not present

## 2018-06-28 DIAGNOSIS — D649 Anemia, unspecified: Secondary | ICD-10-CM | POA: Diagnosis not present

## 2018-06-28 DIAGNOSIS — N186 End stage renal disease: Secondary | ICD-10-CM | POA: Diagnosis not present

## 2018-06-28 DIAGNOSIS — E611 Iron deficiency: Secondary | ICD-10-CM | POA: Diagnosis not present

## 2018-06-28 DIAGNOSIS — N2581 Secondary hyperparathyroidism of renal origin: Secondary | ICD-10-CM | POA: Diagnosis not present

## 2018-07-01 DIAGNOSIS — D649 Anemia, unspecified: Secondary | ICD-10-CM | POA: Diagnosis not present

## 2018-07-01 DIAGNOSIS — N186 End stage renal disease: Secondary | ICD-10-CM | POA: Diagnosis not present

## 2018-07-01 DIAGNOSIS — N2581 Secondary hyperparathyroidism of renal origin: Secondary | ICD-10-CM | POA: Diagnosis not present

## 2018-07-01 DIAGNOSIS — E611 Iron deficiency: Secondary | ICD-10-CM | POA: Diagnosis not present

## 2018-07-03 DIAGNOSIS — N186 End stage renal disease: Secondary | ICD-10-CM | POA: Diagnosis not present

## 2018-07-03 DIAGNOSIS — D649 Anemia, unspecified: Secondary | ICD-10-CM | POA: Diagnosis not present

## 2018-07-03 DIAGNOSIS — E611 Iron deficiency: Secondary | ICD-10-CM | POA: Diagnosis not present

## 2018-07-03 DIAGNOSIS — N2581 Secondary hyperparathyroidism of renal origin: Secondary | ICD-10-CM | POA: Diagnosis not present

## 2018-07-04 DIAGNOSIS — M3219 Other organ or system involvement in systemic lupus erythematosus: Secondary | ICD-10-CM | POA: Diagnosis not present

## 2018-07-04 DIAGNOSIS — I272 Pulmonary hypertension, unspecified: Secondary | ICD-10-CM | POA: Diagnosis not present

## 2018-07-04 DIAGNOSIS — Z79899 Other long term (current) drug therapy: Secondary | ICD-10-CM | POA: Diagnosis not present

## 2018-07-04 DIAGNOSIS — N19 Unspecified kidney failure: Secondary | ICD-10-CM | POA: Diagnosis not present

## 2018-07-05 DIAGNOSIS — E611 Iron deficiency: Secondary | ICD-10-CM | POA: Diagnosis not present

## 2018-07-05 DIAGNOSIS — N2581 Secondary hyperparathyroidism of renal origin: Secondary | ICD-10-CM | POA: Diagnosis not present

## 2018-07-05 DIAGNOSIS — N186 End stage renal disease: Secondary | ICD-10-CM | POA: Diagnosis not present

## 2018-07-05 DIAGNOSIS — D649 Anemia, unspecified: Secondary | ICD-10-CM | POA: Diagnosis not present

## 2018-07-08 DIAGNOSIS — N2581 Secondary hyperparathyroidism of renal origin: Secondary | ICD-10-CM | POA: Diagnosis not present

## 2018-07-08 DIAGNOSIS — D649 Anemia, unspecified: Secondary | ICD-10-CM | POA: Diagnosis not present

## 2018-07-08 DIAGNOSIS — N186 End stage renal disease: Secondary | ICD-10-CM | POA: Diagnosis not present

## 2018-07-08 DIAGNOSIS — E611 Iron deficiency: Secondary | ICD-10-CM | POA: Diagnosis not present

## 2018-07-10 DIAGNOSIS — D649 Anemia, unspecified: Secondary | ICD-10-CM | POA: Diagnosis not present

## 2018-07-10 DIAGNOSIS — N186 End stage renal disease: Secondary | ICD-10-CM | POA: Diagnosis not present

## 2018-07-10 DIAGNOSIS — N2581 Secondary hyperparathyroidism of renal origin: Secondary | ICD-10-CM | POA: Diagnosis not present

## 2018-07-10 DIAGNOSIS — E611 Iron deficiency: Secondary | ICD-10-CM | POA: Diagnosis not present

## 2018-07-11 ENCOUNTER — Ambulatory Visit (INDEPENDENT_AMBULATORY_CARE_PROVIDER_SITE_OTHER): Payer: Medicare Other

## 2018-07-11 ENCOUNTER — Encounter: Payer: Self-pay | Admitting: Family Medicine

## 2018-07-11 ENCOUNTER — Ambulatory Visit (INDEPENDENT_AMBULATORY_CARE_PROVIDER_SITE_OTHER): Payer: Medicare Other | Admitting: Family Medicine

## 2018-07-11 ENCOUNTER — Ambulatory Visit: Payer: Self-pay | Admitting: Family Medicine

## 2018-07-11 VITALS — BP 138/72 | HR 87 | Temp 98.6°F | Ht 67.0 in | Wt 192.0 lb

## 2018-07-11 DIAGNOSIS — Z992 Dependence on renal dialysis: Secondary | ICD-10-CM

## 2018-07-11 DIAGNOSIS — J181 Lobar pneumonia, unspecified organism: Secondary | ICD-10-CM | POA: Diagnosis not present

## 2018-07-11 DIAGNOSIS — I272 Pulmonary hypertension, unspecified: Secondary | ICD-10-CM | POA: Diagnosis not present

## 2018-07-11 DIAGNOSIS — M3214 Glomerular disease in systemic lupus erythematosus: Secondary | ICD-10-CM | POA: Diagnosis not present

## 2018-07-11 DIAGNOSIS — N186 End stage renal disease: Secondary | ICD-10-CM

## 2018-07-11 DIAGNOSIS — R0602 Shortness of breath: Secondary | ICD-10-CM

## 2018-07-11 DIAGNOSIS — J90 Pleural effusion, not elsewhere classified: Secondary | ICD-10-CM | POA: Diagnosis not present

## 2018-07-11 DIAGNOSIS — J189 Pneumonia, unspecified organism: Secondary | ICD-10-CM

## 2018-07-11 LAB — POCT HEMOGLOBIN: Hemoglobin: 7.6 g/dL — AB (ref 12.2–16.2)

## 2018-07-11 MED ORDER — LEVOFLOXACIN 500 MG PO TABS: ORAL_TABLET | ORAL | 0 refills | Status: AC

## 2018-07-11 NOTE — Telephone Encounter (Signed)
See note

## 2018-07-11 NOTE — Telephone Encounter (Signed)
Patient is being seen currently in office by Dr. Yong Channel

## 2018-07-11 NOTE — Patient Instructions (Addendum)
Possible pneumonia in right lower lobe of lung. Treating with levaquin 750mg  today, then in 48 hours 500mg , then in another 48 hours another 500mg , then in another 48 hours another 500mg .   Seek care immediately/call 911 if worsening shortness of breath . I am hoping with this treatment in addition to the shot you got to raise hemoglobin and getting further out from your period that your hemoglobin improves and you start to feel better- in addition to treating this possible pneumonia.   Lets recheck on Monday- can schedule before you go - tell front desk they can use the same day slot on monday  We need you to get your flu shot when all healed up from this

## 2018-07-11 NOTE — Progress Notes (Signed)
Subjective:  [name] [name] is a 37 y.o. year old very pleasant female patient who presents for/with See problem oriented charting ROS- complains of elevated temperatures at night up to high 99s, cough, congestion. Complains of fatigue, has shortness of breath as well.    Past Medical History-  Patient Active Problem List   Diagnosis Date Noted  . ESRD on dialysis [facility]) 08/05/2015    Priority: High  . Essential hypertension 09/22/2010    Priority: High  . Secondary cardiomyopathy ([place]) 07/14/2009    Priority: High  . LUPUS NEPHRITIS 07/14/2009    Priority: High  . ERYTHEMATOSUS, LUPUS 08/07/2006    Priority: High  . History of recurrent cellulitis of lower leg 03/09/2016    Priority: Medium  . Gout 08/14/2012    Priority: Medium  . Pulmonary hypertension, secondary ([place]) 07/28/2010    Priority: Medium  . History of TIA (transient ischemic attack) 07/14/2009    Priority: Medium  . Onychomycosis 12/02/2014    Priority: Low  . Anemia 08/07/2006    Priority: Low  . Acquired hallux valgus of right foot 06/15/2017  . 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 Plantar fasciitis of right foot 06/15/2017  . Atypical chest pain 11/27/2016  . CKD (chronic kidney disease) stage V requiring chronic peritoneal dialysis  03/17/2016  . Intractable nausea and vomiting 02/20/2016  . Enteritis due to Clostridium difficile 05/03/2015    Medications- reviewed and updated Current Outpatient Medications  Medication Sig Dispense Refill  . allopurinol (ZYLOPRIM) 300 MG tablet TAKE 1 TABLET BY MOUTH DAILY (Patient not taking: Reported on 11/27/2016) 90 tablet 2  . hydroxychloroquine (PLAQUENIL) 200 MG tablet TAKE 1 TABLET BY MOUTH TWICE A DAY 60 tablet 1  . LABETALOL HCL PO Take 450 mg by mouth 2 (two) times daily.    . mycophenolate (CELLCEPT) 500 MG tablet Take 1,000 mg by mouth 2 (two) times daily.     Marland Kitchen oxyCODONE-acetaminophen (PERCOCET/ROXICET) 5-325 MG tablet Take 1 tablet by mouth every 6 (six)  hours as needed. 8 tablet 0  . predniSONE (DELTASONE) 10 MG tablet Take 5 mg by mouth daily.   12  . RENVELA 800 MG tablet Take 1,600-3,200 mg by mouth as directed. 4 tablet by mouth three times daily with meals and 1-2 tablets with snacks.  11  . valACYclovir (VALTREX) 1000 MG tablet Take 1,000 mg by mouth daily as needed (for flares.). Reported on 02/14/2016     No current facility-administered medications for this visit.     Objective: BP 138/72 (BP Location: Left Arm, Patient Position: Sitting, Cuff Size: Large)   Pulse 87   Temp 98.6 F (37 C) (Oral)   Ht 5\' 7"  (1.702 m)   Wt 192 lb (87.1 kg)   LMP 07/08/2018   SpO2 98%   BMI 30.07 kg/m  Gen: NAD, resting comfortably Pulsating vessels noted in neck, enlarged AV fistula site Mucus membrane pallor CV: RRR no murmurs rubs or gallops Lungs: CTAB no crackles, wheeze, rhonchi Abdomen: soft/nontender/nondistended/normal bowel sounds.  Ext: no edema Skin: warm, dry  EKG: sinus rhythm with rate 86, normal axis, normal intervals- though QT complex close to half the RR interval, left atrial hypertrophy, slight ST depression in V3-v- appears largely unchanged from previous   Assessment/Plan:  Community acquired pneumonia of right lower lobe of lung ([place])  ESRD on dialysis ([place])  Systemic lupus erythematosus with glomerular disease, unspecified SLE type ([place])  Pulmonary hypertension (HCC)  SOB (shortness of breath) - Plan: EKG 12-Lead, POCT hemoglobin,  DG Chest 2 View S:  Patient called in today complaining of chest pain as well as shortness of breath. She already has shortness of breath with activity but now even basic things are causing shortness of breath like getting out of a chair or getting a shower. The chest pressure is with laying down at night- feels very uncomfortable- better with propping up.   She is a hemodialysis patient on MWF at [facility]- just dropped her dry weight again. She was given a dosage of medicine to boost her  hemoglobin yesterday. She reported SOB yesterday and was told likely due to her low hemoglobin. Felt better after dialysis but then last night felt more short of breath- propping up on pillows at night. Patient using oxygen 2 L at home at night with exertion or for sleep.    Labs have been showing low RBC. She finished her period 3 days ago- was the second one in a month- was not particularly heavy- lasted 3 days. Her hemoglobin is down to 7.6 today- no recent values - but last in our system in 2017 was 12.2- this is frequently monitored at dialysis and she reports #s there at dialysis were low but sure how low.   Patient also has had a cough for a few days as well as elevated temperatures at home at night up to 99s.    Moved [place] for about a year and was cared for them for dialysis and by cardiology and parimary care- had significant workup. Had right and heart cath- left heart cath appeared without significant atheroscleropsis. Right heart cath shows evere pulmonary hypertension. PE was to be ruled out. She was referred to pulmonary as well. She is now on opsumit.  A/P: 37 year old female with pulmonary hypertension, lupus, ESRD on dialysis presents with worsening shortness of breath, elevated temperatures, low hemoglobin concerning for multifactorial cause- anemia, pneumonia, pulmonary hypertension, fluid overload. Se will continue pulmonary hypertension treatment as per cardiology. She will be treated for fluid overload with dialysis tomorrow. Anemia hoepfully improving as she is further out from her LMP and she received erythropoietin stimulating agent yesterday. My read of x-ray was for right lower lobe pneumonia and vascular congestion (radiology confirmed this along with noting her cardiomegaly). With her chronic health conditions- opted to be aggressive with levaquin and adjusted renally- 750mg  today, then 3 more doses of 500mg  spaced by 48 hours.   She knows my preference was for her to go to  the emergency room. I also spoke with another provider who advised the same. She would strongly prefer to trial outpatient therapy first and if not improving then agrees to go to ER. She did agree to Monday follow up with me to ensure improvement.   Future Appointments  Date Time Provider [place]  07/15/2018  1:00 PM Marin Olp, MD [facility]   Lab/Order associations: Community acquired pneumonia of right lower lobe of lung ([place])  ESRD on dialysis ([place])  Systemic lupus erythematosus with glomerular disease, unspecified SLE type ([place])  Pulmonary hypertension ([place])  SOB (shortness of breath) - Plan: EKG 12-Lead, POCT hemoglobin, DG Chest 2 View  Time Stamp The duration of face-to-face time during this visit was greater than 40 (1:10-1:45 and 2:15-2 25 PM) minutes. Greater than 50% of this time was spent in counseling, explanation of diagnosis, planning of further management, and/or coordination of care including discussion of her high risk factors, discussion of ER option vs. Outpatient treatment, discussion of side effects of  medicine, planning next steps, reviewing x-ray) .   Return precautions advised.  Garret Reddish, MD

## 2018-07-11 NOTE — Telephone Encounter (Signed)
Chest pain and SOB- dialysis patient. She states her RBC count is low.Patient states she has been getting progressively worse with SOB with exertion that is not normal for her. Patient states she is a dialysis patient and her labs are showing low RBC count. Patient feels that may have something to do with why she is feeling so bad. Patient is alert and aware and feels she needs to be seen for symptoms.  Reason for Disposition . [1] MILD difficulty breathing (e.g., minimal/no SOB at rest, SOB with walking, pulse <100) AND [2] NEW-onset or WORSE than normal  Answer Assessment - Initial Assessment Questions 1. RESPIRATORY STATUS: "Describe your breathing?" (e.g., wheezing, shortness of breath, unable to speak, severe coughing)      Last few days- patient has been wearing her O2- she states she is SOB even with her O2 2. ONSET: "When did this breathing problem begin?"      3-4 days 3. PATTERN "Does the difficult breathing come and go, or has it been constant since it started?"      Gets better- but at night it gets worse- patient states she is SOB this morning- she did not improve 4. SEVERITY: "How bad is your breathing?" (e.g., mild, moderate, severe)    - MILD: No SOB at rest, mild SOB with walking, speaks normally in sentences, can lay down, no retractions, pulse < 100.    - MODERATE: SOB at rest, SOB with minimal exertion and prefers to sit, cannot lie down flat, speaks in phrases, mild retractions, audible wheezing, pulse 100-120.    - SEVERE: Very SOB at rest, speaks in single words, struggling to breathe, sitting hunched forward, retractions, pulse > 120      mild 5. RECURRENT SYMPTOM: "Have you had difficulty breathing before?" If so, ask: "When was the last time?" and "What happened that time?"      Yes- not at rest- normally with exertion 6. CARDIAC HISTORY: "Do you have any history of heart disease?" (e.g., heart attack, angina, bypass surgery, angioplasty)      Heart failure, pulmonary  hypertension, kidney failure  7. LUNG HISTORY: "Do you have any history of lung disease?"  (e.g., pulmonary embolus, asthma, emphysema)     Pulmonary hypertension  8. CAUSE: "What do you think is causing the breathing problem?"      Not sure- low RBC count or pulmonary hypertension  9. OTHER SYMPTOMS: "Do you have any other symptoms? (e.g., dizziness, runny nose, cough, chest pain, fever)     Low grade fever at night- 99.4- 99.8, congestion in chest 10. PREGNANCY: "Is there any chance you are pregnant?" "When was your last menstrual period?"       No- LMP- 07/08/18 11. TRAVEL: "Have you traveled out of the country in the last month?" (e.g., travel history, exposures)       n/a  Protocols used: BREATHING DIFFICULTY-A-AH

## 2018-07-12 DIAGNOSIS — D649 Anemia, unspecified: Secondary | ICD-10-CM | POA: Diagnosis not present

## 2018-07-12 DIAGNOSIS — E611 Iron deficiency: Secondary | ICD-10-CM | POA: Diagnosis not present

## 2018-07-12 DIAGNOSIS — N186 End stage renal disease: Secondary | ICD-10-CM | POA: Diagnosis not present

## 2018-07-12 DIAGNOSIS — N2581 Secondary hyperparathyroidism of renal origin: Secondary | ICD-10-CM | POA: Diagnosis not present

## 2018-07-15 ENCOUNTER — Ambulatory Visit (INDEPENDENT_AMBULATORY_CARE_PROVIDER_SITE_OTHER): Payer: 59 | Admitting: Family Medicine

## 2018-07-15 ENCOUNTER — Encounter: Payer: Self-pay | Admitting: Family Medicine

## 2018-07-15 VITALS — BP 122/72 | HR 60 | Temp 99.3°F | Ht 67.0 in | Wt 186.0 lb

## 2018-07-15 DIAGNOSIS — J189 Pneumonia, unspecified organism: Secondary | ICD-10-CM

## 2018-07-15 DIAGNOSIS — J181 Lobar pneumonia, unspecified organism: Secondary | ICD-10-CM | POA: Diagnosis not present

## 2018-07-15 DIAGNOSIS — E611 Iron deficiency: Secondary | ICD-10-CM | POA: Diagnosis not present

## 2018-07-15 DIAGNOSIS — Z992 Dependence on renal dialysis: Secondary | ICD-10-CM | POA: Diagnosis not present

## 2018-07-15 DIAGNOSIS — N186 End stage renal disease: Secondary | ICD-10-CM

## 2018-07-15 DIAGNOSIS — D649 Anemia, unspecified: Secondary | ICD-10-CM | POA: Diagnosis not present

## 2018-07-15 DIAGNOSIS — N2581 Secondary hyperparathyroidism of renal origin: Secondary | ICD-10-CM | POA: Diagnosis not present

## 2018-07-15 DIAGNOSIS — D631 Anemia in chronic kidney disease: Secondary | ICD-10-CM

## 2018-07-15 NOTE — Patient Instructions (Addendum)
Thrilled you are doing better. Let us know if things go in the opposite direction. Hoping hemoglobin is coming up.   Please message me when you get your flu shot with dialysis- wait until all the way better from this.

## 2018-07-15 NOTE — Progress Notes (Signed)
Subjective:  [name] [name] is a 37 y.o. year old very pleasant female patient who presents for/with See problem oriented charting ROS- baseline SOB/DOE. No chest pain. No pressure in chest with lying down. No edema. Cough much improved.    Past Medical History-  Patient Active Problem List   Diagnosis Date Noted  . ESRD on dialysis [facility]) 08/05/2015    Priority: High  . Essential hypertension 09/22/2010    Priority: High  . Secondary cardiomyopathy ([place]) 07/14/2009    Priority: High  . LUPUS NEPHRITIS 07/14/2009    Priority: High  . ERYTHEMATOSUS, LUPUS 08/07/2006    Priority: High  . History of recurrent cellulitis of lower leg 03/09/2016    Priority: Medium  . Gout 08/14/2012    Priority: Medium  . Pulmonary hypertension ([place]) 07/28/2010    Priority: Medium  . History of TIA (transient ischemic attack) 07/14/2009    Priority: Medium  . Onychomycosis 12/02/2014    Priority: Low  . Anemia 08/07/2006    Priority: Low  . Acquired hallux valgus of right foot 06/15/2017  . 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 Plantar fasciitis of right foot 06/15/2017  . Atypical chest pain 11/27/2016  . CKD (chronic kidney disease) stage V requiring chronic peritoneal dialysis  03/17/2016  . Intractable nausea and vomiting 02/20/2016  . Enteritis due to Clostridium difficile 05/03/2015    Medications- reviewed and updated Current Outpatient Medications  Medication Sig Dispense Refill  . amLODipine (NORVASC) 10 MG tablet Take 10 mg by mouth daily.  4  . carvedilol (COREG) 25 MG tablet Take 1 tablet by mouth 2 (two) times daily.  9  . cloNIDine (CATAPRES) 0.1 MG tablet Take 0.1 mg by mouth 2 (two) times daily.  0  . hydroxychloroquine (PLAQUENIL) 200 MG tablet TAKE 1 TABLET BY MOUTH TWICE A DAY 60 tablet 1  . levofloxacin (LEVAQUIN) 500 MG tablet Take 1.5 tablets (750 mg total) by mouth daily for 1 day, THEN 1 tablet (500 mg total) every other day for 6 days. 5 tablet 0  . losartan (COZAAR)  100 MG tablet Take 100 mg by mouth daily.  11  . macitentan (OPSUMIT) 10 MG tablet Take 10 mg by mouth daily.    . predniSONE (DELTASONE) 10 MG tablet Take 5 mg by mouth daily.   12  . RENVELA 800 MG tablet Take 1,600-3,200 mg by mouth as directed. 4 tablet by mouth three times daily with meals and 1-2 tablets with snacks.  11   No current facility-administered medications for this visit.     Objective: BP 122/72 (BP Location: Left Arm, Patient Position: Sitting, Cuff Size: Large)   Pulse 60   Temp 99.3 F (37.4 C) (Oral)   Ht 5\' 7"  (1.702 m)   Wt 186 lb (84.4 kg)   LMP 07/08/2018   PF 98 L/min   BMI 29.13 kg/m  Gen: NAD, resting comfortably CV: RRR no murmurs rubs or gallops Lungs: CTAB no crackles, wheeze, rhonchi Abdomen: soft/nontender/nondistended/normal bowel sounds. No rebound or guarding.  Ext: no edema Skin: warm, dry   Assessment/Plan:  CAP anemia S: seen last week and started on levaquin after x-ray showed pneumonia. High risk patient with pulmonary hypertension with DOE at baseline, ESRD. We started her on ESRD dosed levaquin and she is feeling much better. She also has anemia with hgb down to 7.6 last week but had received aranesp or similar last week. She has labs on Wednesday with dialysis to check on this. She is back  to baseline DOE - no longer having issues at rest or with laying down. Dialysis did not seem to help last Friday in regards to SOB and they thought likely pneumonia primary contributor. Cough much improved A/P: patient improving from pneumonia and back to baseline sob/doe. She should finish levaquin. Given SOB improving also suspect anemia is improving- she has labs on Wednesday with dialysis to make sure. She denies temperature elevations at home but slightly high today in 99s- she agrees to return to see [place] if worsening symptoms or does not continue to improve  Handicap placard written- still has to stop to rest before 200 feet walking even at  baseline  Advised 1-2 month follow up- need to get her plugged back in with local cardiologist.   Future Appointments  Date Time Provider [place]  09/13/2018  1:45 PM Marin Olp, MD [facility]   Return precautions advised.  Garret Reddish, MD

## 2018-07-17 DIAGNOSIS — N186 End stage renal disease: Secondary | ICD-10-CM | POA: Diagnosis not present

## 2018-07-17 DIAGNOSIS — D649 Anemia, unspecified: Secondary | ICD-10-CM | POA: Diagnosis not present

## 2018-07-17 DIAGNOSIS — E611 Iron deficiency: Secondary | ICD-10-CM | POA: Diagnosis not present

## 2018-07-17 DIAGNOSIS — N2581 Secondary hyperparathyroidism of renal origin: Secondary | ICD-10-CM | POA: Diagnosis not present

## 2018-07-19 DIAGNOSIS — N186 End stage renal disease: Secondary | ICD-10-CM | POA: Diagnosis not present

## 2018-07-19 DIAGNOSIS — N2581 Secondary hyperparathyroidism of renal origin: Secondary | ICD-10-CM | POA: Diagnosis not present

## 2018-07-19 DIAGNOSIS — E611 Iron deficiency: Secondary | ICD-10-CM | POA: Diagnosis not present

## 2018-07-19 DIAGNOSIS — D649 Anemia, unspecified: Secondary | ICD-10-CM | POA: Diagnosis not present

## 2018-07-19 DIAGNOSIS — I272 Pulmonary hypertension, unspecified: Secondary | ICD-10-CM | POA: Diagnosis not present

## 2018-07-22 DIAGNOSIS — N2581 Secondary hyperparathyroidism of renal origin: Secondary | ICD-10-CM | POA: Diagnosis not present

## 2018-07-22 DIAGNOSIS — N186 End stage renal disease: Secondary | ICD-10-CM | POA: Diagnosis not present

## 2018-07-22 DIAGNOSIS — D649 Anemia, unspecified: Secondary | ICD-10-CM | POA: Diagnosis not present

## 2018-07-22 DIAGNOSIS — E611 Iron deficiency: Secondary | ICD-10-CM | POA: Diagnosis not present

## 2018-07-23 DIAGNOSIS — N186 End stage renal disease: Secondary | ICD-10-CM | POA: Diagnosis not present

## 2018-07-23 DIAGNOSIS — I158 Other secondary hypertension: Secondary | ICD-10-CM | POA: Diagnosis not present

## 2018-07-23 DIAGNOSIS — Z992 Dependence on renal dialysis: Secondary | ICD-10-CM | POA: Diagnosis not present

## 2018-07-24 DIAGNOSIS — N186 End stage renal disease: Secondary | ICD-10-CM | POA: Diagnosis not present

## 2018-07-24 DIAGNOSIS — N2581 Secondary hyperparathyroidism of renal origin: Secondary | ICD-10-CM | POA: Diagnosis not present

## 2018-07-24 DIAGNOSIS — D649 Anemia, unspecified: Secondary | ICD-10-CM | POA: Diagnosis not present

## 2018-07-24 DIAGNOSIS — Z23 Encounter for immunization: Secondary | ICD-10-CM | POA: Diagnosis not present

## 2018-07-25 ENCOUNTER — Ambulatory Visit (INDEPENDENT_AMBULATORY_CARE_PROVIDER_SITE_OTHER): Payer: Medicare Other | Admitting: Gastroenterology

## 2018-07-25 ENCOUNTER — Encounter: Payer: Self-pay | Admitting: Gastroenterology

## 2018-07-25 ENCOUNTER — Encounter

## 2018-07-25 VITALS — BP 118/70 | HR 73 | Ht 67.0 in | Wt 185.0 lb

## 2018-07-25 DIAGNOSIS — R195 Other fecal abnormalities: Secondary | ICD-10-CM

## 2018-07-25 DIAGNOSIS — D62 Acute posthemorrhagic anemia: Secondary | ICD-10-CM

## 2018-07-25 MED ORDER — PANTOPRAZOLE SODIUM 40 MG PO TBEC: 40.0000 mg | DELAYED_RELEASE_TABLET | Freq: Every day | ORAL | 1 refills | Status: DC

## 2018-07-25 MED ORDER — NA SULFATE-K SULFATE-MG SULF 17.5-3.13-1.6 GM/177ML PO SOLN: ORAL | 0 refills | Status: DC

## 2018-07-25 NOTE — Progress Notes (Signed)
Reviewed and agree with management plan.  Shaydon Lease T. Tesla Bochicchio, MD FACG 

## 2018-07-25 NOTE — H&P (View-Only) (Signed)
07/25/2018 [name] [name] [id number] 07/15/1981   HISTORY OF PRESENT ILLNESS: This is a 37 year old female with lupus and lupus nephritis that has caused CKD/ESRD on HD MWF.  On chronic O2 supplement as well.  Referred here by Dr. Lorrene Reid.  Has chronic anemia, but had a 2 gram drop in Hgb over a couple of weeks. On 8/28 her Hgb was 9.4 grams and then on 9/18 it was 7.4 grams.  No overt evidence of bleeding.  A few "darker" stools but no black per se, but was heme positive x 2.  Is on prednisone for her lupus recently (started about one month ago) and around the time she started that she started having heartburn/indigestion.  The heartburn and indigestion was just for a couple of days and then resolved.  She denies NSAID use.  No abdominal pain.   Past Medical History:  Diagnosis Date  . CHF (congestive heart failure) ([place]) 5-6 yrs ago  hosp  . Chronic kidney disease   . ERYTHEMATOSUS, LUPUS 08/07/2006  . Hypertension   . Intestinal infection due to Clostridium difficile   . Secondary cardiomyopathy, unspecified   . Sleep apnea    does not wear CPAP  . Systemic lupus erythematosus ([place])   . TIA (transient ischemic attack)   . Unspecified deficiency anemia   . Unspecified essential hypertension    Past Surgical History:  Procedure Laterality Date  . AV FISTULA PLACEMENT Right 05/23/2016   Procedure: ARTERIOVENOUS (AV) FISTULA CREATION VERSUS GRAFT INSERTION;  Surgeon: Angelia Mould, MD;  Location: [facility] OR;  Service: Vascular;  Laterality: Right;  . kidney biopsies     to determine lupus nephritis    reports that she has never smoked. She has never used smokeless tobacco. She reports that she does not drink alcohol or use drugs. family history includes Cancer in her maternal grandmother; Diabetes in her brother and unknown relative; Lupus in her mother; Prostate cancer in her paternal grandfather. Allergies  Allergen Reactions  . Lisinopril Swelling and Other (See  Comments)  . Nsaids Other (See Comments)    UNKNOWN REACTION-MD TOLD PATIENT NOT TO TAKE She should avoid all due to renal insufficiency      Outpatient Encounter Medications as of 07/25/2018  Medication Sig  . amLODipine (NORVASC) 10 MG tablet Take 10 mg by mouth daily.  . carvedilol (COREG) 25 MG tablet Take 1 tablet by mouth 2 (two) times daily.  . cloNIDine (CATAPRES) 0.1 MG tablet Take 0.1 mg by mouth 2 (two) times daily.  . hydroxychloroquine (PLAQUENIL) 200 MG tablet TAKE 1 TABLET BY MOUTH TWICE A DAY (Patient taking differently: 300 mg. )  . losartan (COZAAR) 100 MG tablet Take 100 mg by mouth daily.  . macitentan (OPSUMIT) 10 MG tablet Take 10 mg by mouth daily.  . predniSONE (DELTASONE) 10 MG tablet Take 5 mg by mouth daily.   Marland Kitchen RENVELA 800 MG tablet Take 1,600-3,200 mg by mouth as directed. 4 tablet by mouth three times daily with meals and 1-2 tablets with snacks.   No facility-administered encounter medications on file as of 07/25/2018.      REVIEW OF SYSTEMS  : All other systems reviewed and negative except where noted in the History of Present Illness.   PHYSICAL EXAM: BP 118/70   Pulse 73   Ht 5\' 7"  (1.702 m)   Wt 185 lb (83.9 kg)   LMP 07/08/2018   SpO2 100%   BMI 28.98 kg/m  General:  Well developed black female in no acute distress Head: Normocephalic and atraumatic Eyes:  Sclerae anicteric, conjunctiva pink. Ears: Normal auditory acuity Lungs: Clear throughout to auscultation; no increased WOB. Heart: Regular rate and rhythm; no M/R/G. Abdomen: Soft, non-distended.  BS present.  Non-tender. Musculoskeletal: Symmetrical with no gross deformities  Skin: No lesions on visible extremities Extremities: No edema  Neurological: Alert oriented x 4, grossly non-focal Psychological:  Alert and cooperative. Normal mood and affect  ASSESSMENT AND PLAN: *37 year old female with lupus and lupus nephritis that has caused CKD/ESRD on HD MWF.  Has chronic anemia, but  had a 2 gram drop in Hgb over a couple of weeks. No overt evidence of bleeding.  A few "darker" stools but no black per se, but was heme positive x 2.  Is on prednisone for her lupus recently and around the time she started that she started having heartburn/indigestion.   *O2 use  **She needs at least EGD, possibly colonoscopy as well.  We are having difficulty with scheduling since her procedure needs to be performed at [facility] and she has dialysis 3 days a week.  Dr. Fuller Plan is in agreement to do an early case on October 21.  We will plan for both procedures if she is able to move her dialysis to later in the day.  In the interim we will plan for twice daily PPI (pantoprazole 40 mg BID).  Will plan to recheck CBC next week.   CC:  Marin Olp, MD  CC:  Dr. Lorrene Reid

## 2018-07-25 NOTE — Patient Instructions (Addendum)
If you are age 39 or older, your body mass index should be between 23-30. Your Body mass index is 28.98 kg/m. If this is out of the aforementioned range listed, please consider follow up with your Primary Care Provider.  If you are age 34 or younger, your body mass index should be between 19-25. Your Body mass index is 28.98 kg/m. If this is out of the aformentioned range listed, please consider follow up with your Primary Care Provider.   You have been scheduled for an endoscopy and colonoscopy. Please follow the written instructions given to you at your visit today. Please pick up your prep supplies at the pharmacy within the next 1-3 days. If you use inhalers (even only as needed), please bring them with you on the day of your procedure. Your physician has requested that you go to www.startemmi.com and enter the access code given to you at your visit today. This web site gives a general overview about your procedure. However, you should still follow specific instructions given to you by our office regarding your preparation for the procedure.   We have sent the following medications to your pharmacy for you to pick up at your convenience: [facility] provider has requested that you go to the basement level for lab work on 08/01/18 Press "B" on the elevator. The lab is located at the first door on the left as you exit the elevator. CBC   Thank you for choosing me and [facility].   Alonza Bogus, PA-C

## 2018-07-25 NOTE — Progress Notes (Signed)
07/25/2018 [name] [name] [id number] [birth year]   HISTORY OF PRESENT ILLNESS: This is a 37 year old female with lupus and lupus nephritis that has caused CKD/ESRD on HD MWF.  On chronic O2 supplement as well.  Referred here by Dr. Lorrene Reid.  Has chronic anemia, but had a 2 gram drop in Hgb over a couple of weeks. On 8/28 her Hgb was 9.4 grams and then on 9/18 it was 7.4 grams.  No overt evidence of bleeding.  A few "darker" stools but no black per se, but was heme positive x 2.  Is on prednisone for her lupus recently (started about one month ago) and around the time she started that she started having heartburn/indigestion.  The heartburn and indigestion was just for a couple of days and then resolved.  She denies NSAID use.  No abdominal pain.   Past Medical History:  Diagnosis Date  . CHF (congestive heart failure) ([place]) 5-6 yrs ago  hosp  . Chronic kidney disease   . ERYTHEMATOSUS, LUPUS 08/07/2006  . Hypertension   . Intestinal infection due to Clostridium difficile   . Secondary cardiomyopathy, unspecified   . Sleep apnea    does not wear CPAP  . Systemic lupus erythematosus ([place])   . TIA (transient ischemic attack)   . Unspecified deficiency anemia   . Unspecified essential hypertension    Past Surgical History:  Procedure Laterality Date  . AV FISTULA PLACEMENT Right 05/23/2016   Procedure: ARTERIOVENOUS (AV) FISTULA CREATION VERSUS GRAFT INSERTION;  Surgeon: Angelia Mould, MD;  Location: [facility] OR;  Service: Vascular;  Laterality: Right;  . kidney biopsies     to determine lupus nephritis    reports that she has never smoked. She has never used smokeless tobacco. She reports that she does not drink alcohol or use drugs. family history includes Cancer in her maternal grandmother; Diabetes in her brother and unknown relative; Lupus in her mother; Prostate cancer in her paternal grandfather. Allergies  Allergen Reactions  . Lisinopril Swelling and Other (See  Comments)  . Nsaids Other (See Comments)    UNKNOWN REACTION-MD TOLD PATIENT NOT TO TAKE She should avoid all due to renal insufficiency      Outpatient Encounter Medications as of 07/25/2018  Medication Sig  . amLODipine (NORVASC) 10 MG tablet Take 10 mg by mouth daily.  . carvedilol (COREG) 25 MG tablet Take 1 tablet by mouth 2 (two) times daily.  . cloNIDine (CATAPRES) 0.1 MG tablet Take 0.1 mg by mouth 2 (two) times daily.  . hydroxychloroquine (PLAQUENIL) 200 MG tablet TAKE 1 TABLET BY MOUTH TWICE A DAY (Patient taking differently: 300 mg. )  . losartan (COZAAR) 100 MG tablet Take 100 mg by mouth daily.  . macitentan (OPSUMIT) 10 MG tablet Take 10 mg by mouth daily.  . predniSONE (DELTASONE) 10 MG tablet Take 5 mg by mouth daily.   Marland Kitchen RENVELA 800 MG tablet Take 1,600-3,200 mg by mouth as directed. 4 tablet by mouth three times daily with meals and 1-2 tablets with snacks.   No facility-administered encounter medications on file as of 07/25/2018.      REVIEW OF SYSTEMS  : All other systems reviewed and negative except where noted in the History of Present Illness.   PHYSICAL EXAM: BP 118/70   Pulse 73   Ht 5\' 7"  (1.702 m)   Wt 185 lb (83.9 kg)   LMP 07/08/2018   SpO2 100%   BMI 28.98 kg/m  General:  Well developed black female in no acute distress Head: Normocephalic and atraumatic Eyes:  Sclerae anicteric, conjunctiva pink. Ears: Normal auditory acuity Lungs: Clear throughout to auscultation; no increased WOB. Heart: Regular rate and rhythm; no M/R/G. Abdomen: Soft, non-distended.  BS present.  Non-tender. Musculoskeletal: Symmetrical with no gross deformities  Skin: No lesions on visible extremities Extremities: No edema  Neurological: Alert oriented x 4, grossly non-focal Psychological:  Alert and cooperative. Normal mood and affect  ASSESSMENT AND PLAN: *37 year old female with lupus and lupus nephritis that has caused CKD/ESRD on HD MWF.  Has chronic anemia, but  had a 2 gram drop in Hgb over a couple of weeks. No overt evidence of bleeding.  A few "darker" stools but no black per se, but was heme positive x 2.  Is on prednisone for her lupus recently and around the time she started that she started having heartburn/indigestion.   *O2 use  **She needs at least EGD, possibly colonoscopy as well.  We are having difficulty with scheduling since her procedure needs to be performed at [facility] and she has dialysis 3 days a week.  Dr. Fuller Plan is in agreement to do an early case on October 21.  We will plan for both procedures if she is able to move her dialysis to later in the day.  In the interim we will plan for twice daily PPI (pantoprazole 40 mg BID).  Will plan to recheck CBC next week.   CC:  Marin Olp, MD  CC:  Dr. Lorrene Reid

## 2018-07-25 NOTE — Addendum Note (Signed)
Addended by: Candie Mile on: 07/25/2018 02:14 PM   Modules accepted: Orders, SmartSet

## 2018-07-26 DIAGNOSIS — Z23 Encounter for immunization: Secondary | ICD-10-CM | POA: Diagnosis not present

## 2018-07-26 DIAGNOSIS — D649 Anemia, unspecified: Secondary | ICD-10-CM | POA: Diagnosis not present

## 2018-07-26 DIAGNOSIS — N2581 Secondary hyperparathyroidism of renal origin: Secondary | ICD-10-CM | POA: Diagnosis not present

## 2018-07-26 DIAGNOSIS — N186 End stage renal disease: Secondary | ICD-10-CM | POA: Diagnosis not present

## 2018-07-29 DIAGNOSIS — N186 End stage renal disease: Secondary | ICD-10-CM | POA: Diagnosis not present

## 2018-07-29 DIAGNOSIS — D649 Anemia, unspecified: Secondary | ICD-10-CM | POA: Diagnosis not present

## 2018-07-29 DIAGNOSIS — Z23 Encounter for immunization: Secondary | ICD-10-CM | POA: Diagnosis not present

## 2018-07-29 DIAGNOSIS — N2581 Secondary hyperparathyroidism of renal origin: Secondary | ICD-10-CM | POA: Diagnosis not present

## 2018-07-30 ENCOUNTER — Encounter: Payer: 59 | Admitting: Gastroenterology

## 2018-07-31 DIAGNOSIS — N2581 Secondary hyperparathyroidism of renal origin: Secondary | ICD-10-CM | POA: Diagnosis not present

## 2018-07-31 DIAGNOSIS — N186 End stage renal disease: Secondary | ICD-10-CM | POA: Diagnosis not present

## 2018-07-31 DIAGNOSIS — D649 Anemia, unspecified: Secondary | ICD-10-CM | POA: Diagnosis not present

## 2018-07-31 DIAGNOSIS — Z23 Encounter for immunization: Secondary | ICD-10-CM | POA: Diagnosis not present

## 2018-08-01 ENCOUNTER — Other Ambulatory Visit (INDEPENDENT_AMBULATORY_CARE_PROVIDER_SITE_OTHER): Payer: 59

## 2018-08-01 ENCOUNTER — Other Ambulatory Visit: Payer: Self-pay

## 2018-08-01 DIAGNOSIS — D62 Acute posthemorrhagic anemia: Secondary | ICD-10-CM

## 2018-08-01 DIAGNOSIS — R195 Other fecal abnormalities: Secondary | ICD-10-CM

## 2018-08-01 LAB — CBC
HCT: 23.4 % — CL (ref 36.0–46.0)
Hemoglobin: 7.8 g/dL — CL (ref 12.0–15.0)
MCHC: 33.2 g/dL (ref 30.0–36.0)
MCV: 83.2 fl (ref 78.0–100.0)
Platelets: 154 10*3/uL (ref 150.0–400.0)
RBC: 2.81 Mil/uL — AB (ref 3.87–5.11)
RDW: 16.5 % — ABNORMAL HIGH (ref 11.5–15.5)
WBC: 3.4 10*3/uL — ABNORMAL LOW (ref 4.0–10.5)

## 2018-08-02 ENCOUNTER — Ambulatory Visit: Payer: Self-pay | Admitting: Family Medicine

## 2018-08-02 ENCOUNTER — Encounter: Payer: Self-pay | Admitting: Family Medicine

## 2018-08-02 ENCOUNTER — Ambulatory Visit (INDEPENDENT_AMBULATORY_CARE_PROVIDER_SITE_OTHER): Payer: Medicare Other

## 2018-08-02 ENCOUNTER — Ambulatory Visit (INDEPENDENT_AMBULATORY_CARE_PROVIDER_SITE_OTHER): Payer: Medicare Other | Admitting: Family Medicine

## 2018-08-02 VITALS — BP 122/80 | HR 90 | Temp 98.5°F | Ht 67.0 in | Wt 184.6 lb

## 2018-08-02 DIAGNOSIS — R091 Pleurisy: Secondary | ICD-10-CM

## 2018-08-02 DIAGNOSIS — R109 Unspecified abdominal pain: Secondary | ICD-10-CM

## 2018-08-02 DIAGNOSIS — N186 End stage renal disease: Secondary | ICD-10-CM | POA: Diagnosis not present

## 2018-08-02 DIAGNOSIS — R05 Cough: Secondary | ICD-10-CM

## 2018-08-02 DIAGNOSIS — D649 Anemia, unspecified: Secondary | ICD-10-CM | POA: Diagnosis not present

## 2018-08-02 DIAGNOSIS — N2581 Secondary hyperparathyroidism of renal origin: Secondary | ICD-10-CM | POA: Diagnosis not present

## 2018-08-02 DIAGNOSIS — R079 Chest pain, unspecified: Secondary | ICD-10-CM | POA: Diagnosis not present

## 2018-08-02 DIAGNOSIS — Z23 Encounter for immunization: Secondary | ICD-10-CM | POA: Diagnosis not present

## 2018-08-02 NOTE — Progress Notes (Signed)
Patient: [name] [name] MRN: [id number] DOB: [birth year] PCP: Marin Olp, MD     Subjective:  Chief Complaint  Patient presents with  . right sided chest pain w/deep breath    x 2 days    HPI: The patient is a 37 y.o. female who presents today for right sided chest pain that started Wednesday after dialysis. After she took a deep breath she would feel a sharp pain on the right side. She has no problems breathing normal. She has no pain with positional changes, it is only with breathing. She uses oxygen at night and as needed during exertion/far distances. It hurts her to laugh. She has tried tylenol. She has not tried a heating pad. No fever/chills, no cough, no new work out. She had pneumonia in September and felt like she was back to baseline. She denies any trauma. She rates the pain as a 6/10 only with the deep breath/cough. Doesn't seem to be correlated food.   Review of Systems  Constitutional: Positive for fatigue. Negative for chills and fever.  Respiratory: Negative for cough, chest tightness, shortness of breath and wheezing.   Cardiovascular: Negative for chest pain.  Gastrointestinal: Positive for nausea. Negative for abdominal pain and vomiting.  Musculoskeletal: Positive for back pain.       Right sided upper back pain when taking deep breath x 2 days.  Dialysis pt-3x/week  Neurological: Positive for weakness.    Allergies Patient is allergic to lisinopril and nsaids.  Past Medical History Patient  has a past medical history of CHF (congestive heart failure) ([place]) (5-6 yrs ago  hosp), Chronic kidney disease, ERYTHEMATOSUS, LUPUS (08/07/2006), Hypertension, Intestinal infection due to Clostridium difficile, Secondary cardiomyopathy, unspecified, Sleep apnea, Systemic lupus erythematosus ([place]), TIA (transient ischemic attack), Unspecified deficiency anemia, and Unspecified essential hypertension.  Surgical History Patient  has a past surgical history that  includes kidney biopsies and AV fistula placement (Right, 05/23/2016).  Family History Pateint's family history includes Cancer in her maternal grandmother; Diabetes in her brother and unknown relative; Lupus in her mother; Prostate cancer in her paternal grandfather.  Social History Patient  reports that she has never smoked. She has never used smokeless tobacco. She reports that she does not drink alcohol or use drugs.    Objective: Vitals:   08/02/18 1349  BP: 122/80  Pulse: 90  Temp: 98.5 F (36.9 C)  TempSrc: Oral  SpO2: 100%  Weight: 184 lb 9.6 oz (83.7 kg)  Height: 5\' 7"  (1.702 m)    Body mass index is 28.91 kg/m.  Physical Exam  Constitutional: She appears well-developed and well-nourished.  Neck: Normal range of motion. Neck supple. No thyromegaly present.  Cardiovascular: Normal rate and regular rhythm.  Murmur heard. Pulmonary/Chest: Effort normal and breath sounds normal.  Abdominal: Soft. Bowel sounds are normal. There is tenderness (mild RUQ tendnerness ).  Musculoskeletal:  TTP over right lateral side and back along T9-T10.   Lymphadenopathy:    She has no cervical adenopathy.  Skin:  Fistula in right arm   Vitals reviewed.  CXR: no fluid/consolidation. Has calcification in right lower lobe. Official read  ? Cholelithiasis      Assessment/plan: 1. Pleurisy History consistent with pleurisy, but ruling out gallbladder with the back pain/side pain and possible stone. Conservative tx for pleurisy with deep breaths, tylenol prn and heating pad. Want her to keep deep breathing. Fever/worseing symptoms please let us know.  - DG Chest 2 View  2. Right sided abdominal pain Calcification  in RLQ unchanged from xray in 06/2018. Checking ultrasound to rule out gallstones that could be causing her sharp pain in her back. Discussed precautions with her including increased pain, fever/chills, n/v to go to ER. Will f/u on her ultrasound,  - US ABDOMEN LIMITED RUQ;  Future      Return if symptoms worsen or fail to improve.     Orma Flaming, MD [place]   08/02/2018

## 2018-08-02 NOTE — Telephone Encounter (Signed)
Summary: back pains with deep breath x 2 days.   Patient calling and states that she is having sharp, stabbing back pains when she takes a deep breath in. States that this has been going on for 2 days and she thought it would just go away. Denies having trouble breathing or SOB. Would like to speak with a nurse.  CB#: [phone]      Reason for Disposition . [1] MILD difficulty breathing (e.g., minimal/no SOB at rest, SOB with walking, pulse <100) AND [2] NEW-onset or WORSE than normal  Answer Assessment - Initial Assessment Questions 1. RESPIRATORY STATUS: "Describe your breathing?" (e.g., wheezing, shortness of breath, unable to speak, severe coughing)      Patient is dialysis patient -she is not suffering with congestion of sinus problems. She does have acid reflux 2. ONSET: "When did this breathing problem begin?"      2 days 3. PATTERN "Does the difficult breathing come and go, or has it been constant since it started?"      Patient states if she breaths normally- she does not have the pain- but if she takes a deep breath or moves or puts positional pressure on lungs- she has pain 4. SEVERITY: "How bad is your breathing?" (e.g., mild, moderate, severe)    - MILD: No SOB at rest, mild SOB with walking, speaks normally in sentences, can lay down, no retractions, pulse < 100.    - MODERATE: SOB at rest, SOB with minimal exertion and prefers to sit, cannot lie down flat, speaks in phrases, mild retractions, audible wheezing, pulse 100-120.    - SEVERE: Very SOB at rest, speaks in single words, struggling to breathe, sitting hunched forward, retractions, pulse > 120      No SOB 5. RECURRENT SYMPTOM: "Have you had difficulty breathing before?" If so, ask: "When was the last time?" and "What happened that time?"      Never had this pain before 6. CARDIAC HISTORY: "Do you have any history of heart disease?" (e.g., heart attack, angina, bypass surgery, angioplasty)      Heart failure, pulmonary  hypertension 7. LUNG HISTORY: "Do you have any history of lung disease?"  (e.g., pulmonary embolus, asthma, emphysema)     Pulmonary hypertension 8. CAUSE: "What do you think is causing the breathing problem?"      unknown 9. OTHER SYMPTOMS: "Do you have any other symptoms? (e.g., dizziness, runny nose, cough, chest pain, fever)     Acid reflux, nausea 10. PREGNANCY: "Is there any chance you are pregnant?" "When was your last menstrual period?"       No- LMP-due- not sexually active at this time 91. TRAVEL: "Have you traveled out of the country in the last month?" (e.g., travel history, exposures)       no  Protocols used: BREATHING DIFFICULTY-A-AH

## 2018-08-02 NOTE — Patient Instructions (Signed)
Pleurisy Pleurisy is irritation and swelling (inflammation) of the linings of your lungs (pleura). This can cause pain in your chest, back, or shoulder. It can also cause trouble breathing. Follow these instructions at home: Medicines  Take over-the-counter and prescription medicines only as told by your doctor.  If you were prescribed antibiotic medicine, take it as told by your doctor. Do not stop taking the antibiotic even if you start to feel better. Activity  Rest and return to your normal activities as told by your doctor. Ask your doctor what activities are safe for you.  Do not drive or use heavy machinery while taking prescription pain medicine. General instructions  Watch for any changes in your condition.  Take deep breaths often, even if it is painful. This can help prevent lung problems.  When lying down, lie on your painful side. This may help you feel less pain.  Do not smoke. If you need help quitting, ask your doctor.  Keep all follow-up visits as told by your doctor. This is important. Contact a doctor if:  You have pain that: ? Gets worse. ? Does not get better with medicine. ? Lasts for more than 1 week.  You have a fever or chills.  You have a cough that does not get better at home.  You have trouble breathing that does not get better at home.  You cough up liquid that looks like pus (purulent secretions). Get help right away if:  Your lips, fingernails, or toenails turn dark or turn blue.  You cough up blood.  You have trouble breathing that gets worse.  You are making loud noises when you breathe (wheezing) and this gets worse.  You have pain that spreads to your neck, arms, or jaw.  You get a rash.  You throw up (vomit).  You pass out (faint). Summary  Pleurisy is irritation and swelling (inflammation) of the linings of your lungs (pleura).  Pleurisy can cause pain and trouble breathing.  If you have a cough that does not get better  at home, contact your doctor.  Get help right away if you are having trouble breathing and it is getting worse. This information is not intended to replace advice given to you by your health care provider. Make sure you discuss any questions you have with your health care provider. Document Released: 09/21/2008 Document Revised: 07/03/2016 Document Reviewed: 07/03/2016 Elsevier Interactive Patient Education  2017 [employer].

## 2018-08-05 DIAGNOSIS — D649 Anemia, unspecified: Secondary | ICD-10-CM | POA: Diagnosis not present

## 2018-08-05 DIAGNOSIS — N186 End stage renal disease: Secondary | ICD-10-CM | POA: Diagnosis not present

## 2018-08-05 DIAGNOSIS — N2581 Secondary hyperparathyroidism of renal origin: Secondary | ICD-10-CM | POA: Diagnosis not present

## 2018-08-05 DIAGNOSIS — Z23 Encounter for immunization: Secondary | ICD-10-CM | POA: Diagnosis not present

## 2018-08-06 ENCOUNTER — Ambulatory Visit: Payer: 59 | Admitting: Gastroenterology

## 2018-08-06 ENCOUNTER — Ambulatory Visit
Admission: RE | Admit: 2018-08-06 | Discharge: 2018-08-06 | Disposition: A | Discharge status: Home / Self Care | Payer: 59 | Source: Ambulatory Visit | Attending: Family Medicine | Admitting: Family Medicine

## 2018-08-06 ENCOUNTER — Encounter: Payer: Self-pay | Admitting: Family Medicine

## 2018-08-06 DIAGNOSIS — R1011 Right upper quadrant pain: Secondary | ICD-10-CM | POA: Diagnosis not present

## 2018-08-06 DIAGNOSIS — R109 Unspecified abdominal pain: Secondary | ICD-10-CM

## 2018-08-07 DIAGNOSIS — D649 Anemia, unspecified: Secondary | ICD-10-CM | POA: Diagnosis not present

## 2018-08-07 DIAGNOSIS — N2581 Secondary hyperparathyroidism of renal origin: Secondary | ICD-10-CM | POA: Diagnosis not present

## 2018-08-07 DIAGNOSIS — N186 End stage renal disease: Secondary | ICD-10-CM | POA: Diagnosis not present

## 2018-08-07 DIAGNOSIS — Z23 Encounter for immunization: Secondary | ICD-10-CM | POA: Diagnosis not present

## 2018-08-09 ENCOUNTER — Ambulatory Visit (HOSPITAL_COMMUNITY)
Admission: RE | Admit: 2018-08-09 | Discharge: 2018-08-09 | Disposition: A | Discharge status: Home / Self Care | Payer: 59 | Source: Ambulatory Visit | Attending: Nephrology | Admitting: Nephrology

## 2018-08-09 DIAGNOSIS — N2581 Secondary hyperparathyroidism of renal origin: Secondary | ICD-10-CM | POA: Diagnosis not present

## 2018-08-09 DIAGNOSIS — D649 Anemia, unspecified: Secondary | ICD-10-CM | POA: Insufficient documentation

## 2018-08-09 DIAGNOSIS — N186 End stage renal disease: Secondary | ICD-10-CM | POA: Diagnosis not present

## 2018-08-09 DIAGNOSIS — Z23 Encounter for immunization: Secondary | ICD-10-CM | POA: Diagnosis not present

## 2018-08-09 LAB — ABO/RH: ABO/RH(D): O POS

## 2018-08-09 LAB — PREPARE RBC (CROSSMATCH)

## 2018-08-09 MED ORDER — SODIUM CHLORIDE 0.9% IV SOLUTION
Freq: Once | INTRAVENOUS | Status: AC
  Administered 2018-08-09: 12:00:00 via INTRAVENOUS

## 2018-08-09 NOTE — Progress Notes (Signed)
                   [facility] Note   Diagnosis: Anemia    Provider: C. Lorrene Reid     Procedure: One unit PRBC    Note: [name] [name] came to the [facility] today. Patient was typed and screened, received one unit of PRBC via PIV. Tolerated well. Discharge instructions review and understanding verbalized.  Patient alert, oriented, verbal and ambulatory at time of discharge. Left with visitor.  Otho Bellows, RN

## 2018-08-09 NOTE — Discharge Instructions (Signed)

## 2018-08-10 LAB — BPAM RBC
BLOOD PRODUCT EXPIRATION DATE: 201911182359
ISSUE DATE / TIME: 201910181149
Unit Type and Rh: 5100

## 2018-08-10 LAB — TYPE AND SCREEN
ABO/RH(D): O POS
Antibody Screen: NEGATIVE
UNIT DIVISION: 0

## 2018-08-12 ENCOUNTER — Encounter (HOSPITAL_COMMUNITY): Payer: Self-pay | Admitting: *Deleted

## 2018-08-12 ENCOUNTER — Ambulatory Visit (HOSPITAL_COMMUNITY)
Admission: RE | Admit: 2018-08-12 | Discharge: 2018-08-12 | Disposition: A | Discharge status: Home / Self Care | Payer: Medicare Other | Source: Ambulatory Visit | Attending: Gastroenterology | Admitting: Gastroenterology

## 2018-08-12 ENCOUNTER — Other Ambulatory Visit: Payer: Self-pay

## 2018-08-12 ENCOUNTER — Ambulatory Visit (HOSPITAL_COMMUNITY): Payer: Medicare Other | Admitting: Certified Registered Nurse Anesthetist

## 2018-08-12 ENCOUNTER — Encounter (HOSPITAL_COMMUNITY)
Admission: RE | Disposition: A | Discharge status: Home / Self Care | Payer: Self-pay | Source: Ambulatory Visit | Attending: Gastroenterology

## 2018-08-12 DIAGNOSIS — Z992 Dependence on renal dialysis: Secondary | ICD-10-CM | POA: Diagnosis not present

## 2018-08-12 DIAGNOSIS — Z9981 Dependence on supplemental oxygen: Secondary | ICD-10-CM | POA: Diagnosis not present

## 2018-08-12 DIAGNOSIS — D500 Iron deficiency anemia secondary to blood loss (chronic): Secondary | ICD-10-CM | POA: Insufficient documentation

## 2018-08-12 DIAGNOSIS — K2980 Duodenitis without bleeding: Secondary | ICD-10-CM | POA: Insufficient documentation

## 2018-08-12 DIAGNOSIS — K317 Polyp of stomach and duodenum: Secondary | ICD-10-CM | POA: Diagnosis not present

## 2018-08-12 DIAGNOSIS — K640 First degree hemorrhoids: Secondary | ICD-10-CM | POA: Diagnosis not present

## 2018-08-12 DIAGNOSIS — G4730 Sleep apnea, unspecified: Secondary | ICD-10-CM | POA: Diagnosis not present

## 2018-08-12 DIAGNOSIS — K219 Gastro-esophageal reflux disease without esophagitis: Secondary | ICD-10-CM | POA: Diagnosis not present

## 2018-08-12 DIAGNOSIS — I132 Hypertensive heart and chronic kidney disease with heart failure and with stage 5 chronic kidney disease, or end stage renal disease: Secondary | ICD-10-CM | POA: Insufficient documentation

## 2018-08-12 DIAGNOSIS — M3214 Glomerular disease in systemic lupus erythematosus: Secondary | ICD-10-CM | POA: Insufficient documentation

## 2018-08-12 DIAGNOSIS — K5730 Diverticulosis of large intestine without perforation or abscess without bleeding: Secondary | ICD-10-CM | POA: Diagnosis not present

## 2018-08-12 DIAGNOSIS — R195 Other fecal abnormalities: Secondary | ICD-10-CM | POA: Insufficient documentation

## 2018-08-12 DIAGNOSIS — N186 End stage renal disease: Secondary | ICD-10-CM | POA: Insufficient documentation

## 2018-08-12 DIAGNOSIS — I509 Heart failure, unspecified: Secondary | ICD-10-CM | POA: Diagnosis not present

## 2018-08-12 DIAGNOSIS — Z79899 Other long term (current) drug therapy: Secondary | ICD-10-CM | POA: Insufficient documentation

## 2018-08-12 DIAGNOSIS — Z7952 Long term (current) use of systemic steroids: Secondary | ICD-10-CM | POA: Diagnosis not present

## 2018-08-12 DIAGNOSIS — Z8673 Personal history of transient ischemic attack (TIA), and cerebral infarction without residual deficits: Secondary | ICD-10-CM | POA: Diagnosis not present

## 2018-08-12 DIAGNOSIS — D62 Acute posthemorrhagic anemia: Secondary | ICD-10-CM

## 2018-08-12 HISTORY — PX: BIOPSY: SHX5522

## 2018-08-12 HISTORY — PX: COLONOSCOPY WITH PROPOFOL: SHX5780

## 2018-08-12 HISTORY — PX: ESOPHAGOGASTRODUODENOSCOPY (EGD) WITH PROPOFOL: SHX5813

## 2018-08-12 SURGERY — ESOPHAGOGASTRODUODENOSCOPY (EGD) WITH PROPOFOL
Anesthesia: Monitor Anesthesia Care

## 2018-08-12 MED ORDER — PROPOFOL 10 MG/ML IV BOLUS
INTRAVENOUS | Status: AC
  Filled 2018-08-12: qty 60

## 2018-08-12 MED ORDER — PROPOFOL 10 MG/ML IV BOLUS
INTRAVENOUS | Status: DC | PRN
  Administered 2018-08-12 (×5): 20 mg via INTRAVENOUS

## 2018-08-12 MED ORDER — LIDOCAINE 2% (20 MG/ML) 5 ML SYRINGE
INTRAMUSCULAR | Status: DC | PRN
  Administered 2018-08-12: 100 mg via INTRAVENOUS

## 2018-08-12 MED ORDER — PROPOFOL 500 MG/50ML IV EMUL
INTRAVENOUS | Status: DC | PRN
  Administered 2018-08-12: 100 ug/kg/min via INTRAVENOUS

## 2018-08-12 MED ORDER — SODIUM CHLORIDE 0.9 % IV SOLN
INTRAVENOUS | Status: DC
  Administered 2018-08-12: 07:00:00 via INTRAVENOUS

## 2018-08-12 SURGICAL SUPPLY — 24 items

## 2018-08-12 NOTE — Op Note (Signed)
[facility] Patient Name: [name] [name] Procedure Date: 08/12/2018 MRN: [id number] Attending MD: Ladene Artist , MD Date of Birth: 1981-06-19 CSN: [id number] Age: 37 Admit Type: Outpatient Procedure:                Colonoscopy Indications:              Gastrointestinal occult blood loss, anemia Providers:                Pricilla Riffle. Fuller Plan, MD, Angus Seller, Tinnie Gens,                            Technician, Adair Laundry, CRNA Referring MD:             Jamal Maes, MD Medicines:                Monitored Anesthesia Care Complications:            No immediate complications. Estimated blood loss:                            None. Estimated Blood Loss:     Estimated blood loss: none. Procedure:                Pre-Anesthesia Assessment:                           - Prior to the procedure, a History and Physical                            was performed, and patient medications and                            allergies were reviewed. The patient's tolerance of                            previous anesthesia was also reviewed. The risks                            and benefits of the procedure and the sedation                            options and risks were discussed with the patient.                            All questions were answered, and informed consent                            was obtained. Prior Anticoagulants: The patient has                            taken no previous anticoagulant or antiplatelet                            agents. ASA Grade Assessment: III - A patient with  severe systemic disease. After reviewing the risks                            and benefits, the patient was deemed in                            satisfactory condition to undergo the procedure.                           After obtaining informed consent, the colonoscope                            was passed under direct vision. Throughout the             procedure, the patient's blood pressure, pulse, and                            oxygen saturations were monitored continuously. The                            [id number]-H190DL ([id number] Olympus peds colonoscope was                            introduced through the anus and advanced to the the                            cecum, identified by appendiceal orifice and                            ileocecal valve. The ileocecal valve, appendiceal                            orifice, and rectum were photographed. The quality                            of the bowel preparation was excellent. The                            colonoscopy was performed without difficulty. The                            patient tolerated the procedure well. Scope In: 7:54:58 AM Scope Out: 2:44:01 AM Scope Withdrawal Time: 0 hours 9 minutes 42 seconds  Total Procedure Duration: 0 hours 11 minutes 59 seconds  Findings:      The perianal and digital rectal examinations were normal.      A few small-mouthed diverticula were found in the left colon.      Internal hemorrhoids were found during retroflexion. The hemorrhoids       were small and Grade I (internal hemorrhoids that do not prolapse).      The exam was otherwise without abnormality on direct and retroflexion       views. Impression:               - Diverticulosis in the left colon.                           -  Internal hemorrhoids.                           - The examination was otherwise normal on direct                            and retroflexion views.                           - No specimens collected. Moderate Sedation:      N/A- Per Anesthesia Care Recommendation:           - Repeat colonoscopy at age 7 for screening                            purposes.                           - Patient has a contact number available for                            emergencies. The signs and symptoms of potential                            delayed complications were  discussed with the                            patient. Return to normal activities tomorrow.                            Written discharge instructions were provided to the                            patient.                           - Resume previous diet.                           - Continue present medications. Procedure Code(s):        --- Professional ---                           [phone], Colonoscopy, flexible; diagnostic, including                            collection of specimen(s) by brushing or washing,                            when performed (separate procedure) Diagnosis Code(s):        --- Professional ---                           K64.0, First degree hemorrhoids                           R19.5, Other fecal abnormalities  D50.0, Iron deficiency anemia secondary to blood                            loss (chronic)                           K57.30, Diverticulosis of large intestine without                            perforation or abscess without bleeding CPT copyright 2018 American Medical Association. All rights reserved. The codes documented in this report are preliminary and upon coder review may  be revised to meet current compliance requirements. Ladene Artist, MD 08/12/2018 8:23:22 AM This report has been signed electronically. Number of Addenda: 0

## 2018-08-12 NOTE — Discharge Instructions (Signed)
YOU HAD AN ENDOSCOPIC PROCEDURE TODAY: Refer to the procedure report and other information in the discharge instructions given to you for any specific questions about what was found during the examination. If this information does not answer your questions, please call [place] office at [phone] to clarify.  ° °YOU SHOULD EXPECT: Some feelings of bloating in the abdomen. Passage of more gas than usual. Walking can help get rid of the air that was put into your GI tract during the procedure and reduce the bloating. If you had a lower endoscopy (such as a colonoscopy or flexible sigmoidoscopy) you may notice spotting of blood in your stool or on the toilet paper. Some abdominal soreness may be present for a day or two, also. ° °DIET: Your first meal following the procedure should be a light meal and then it is ok to progress to your normal diet. A half-sandwich or bowl of soup is an example of a good first meal. Heavy or fried foods are harder to digest and may make you feel nauseous or bloated. Drink plenty of fluids but you should avoid alcoholic beverages for 24 hours. If you had a esophageal dilation, please see attached instructions for diet.   ° °ACTIVITY: Your care partner should take you home directly after the procedure. You should plan to take it easy, moving slowly for the rest of the day. You can resume normal activity the day after the procedure however YOU SHOULD NOT DRIVE, use power tools, machinery or perform tasks that involve climbing or major physical exertion for 24 hours (because of the sedation medicines used during the test).  ° °SYMPTOMS TO REPORT IMMEDIATELY: °A gastroenterologist can be reached at any hour. Please call [phone]  for any of the following symptoms:  °Following lower endoscopy (colonoscopy, flexible sigmoidoscopy) °Excessive amounts of blood in the stool  °Significant tenderness, worsening of abdominal pains  °Swelling of the abdomen that is new, acute  °Fever of 100° or  higher  °Following upper endoscopy (EGD, EUS, ERCP, esophageal dilation) °Vomiting of blood or coffee ground material  °New, significant abdominal pain  °New, significant chest pain or pain under the shoulder blades  °Painful or persistently difficult swallowing  °New shortness of breath  °Black, tarry-looking or red, bloody stools ° °FOLLOW UP:  °If any biopsies were taken you will be contacted by phone or by letter within the next 1-3 weeks. Call [phone]  if you have not heard about the biopsies in 3 weeks.  °Please also call with any specific questions about appointments or follow up tests. ° °

## 2018-08-12 NOTE — Anesthesia Postprocedure Evaluation (Signed)
Anesthesia Post Note  Patient: [name] [name]  Procedure(s) Performed: ESOPHAGOGASTRODUODENOSCOPY (EGD) WITH PROPOFOL (N/A ) COLONOSCOPY WITH PROPOFOL (N/A ) BIOPSY     Patient location during evaluation: PACU Anesthesia Type: MAC Level of consciousness: awake and alert Pain management: pain level controlled Vital Signs Assessment: post-procedure vital signs reviewed and stable Respiratory status: spontaneous breathing, nonlabored ventilation, respiratory function stable and patient connected to nasal cannula oxygen Cardiovascular status: stable and blood pressure returned to baseline Postop Assessment: no apparent nausea or vomiting Anesthetic complications: no    Last Vitals:  Vitals:   08/12/18 0835 08/12/18 0845  BP: (!) 148/83 140/89  Resp: (!) 26 (!) 24  Temp:    SpO2: 100% 100%    Last Pain:  Vitals:   08/12/18 0845  TempSrc:   PainSc: 0-No pain                 Effie Berkshire

## 2018-08-12 NOTE — Anesthesia Preprocedure Evaluation (Addendum)
Anesthesia Evaluation  Patient identified by MRN, date of birth, ID band Patient awake    Reviewed: Allergy & Precautions, NPO status , Patient's Chart, lab work & pertinent test results  Airway Mallampati: II  TM Distance: >3 FB Neck ROM: Full    Dental  (+) Teeth Intact, Dental Advisory Given   Pulmonary sleep apnea ,    breath sounds clear to auscultation       Cardiovascular hypertension, Pt. on home beta blockers and Pt. on medications +CHF   Rhythm:Regular Rate:Normal     Neuro/Psych TIA   GI/Hepatic Neg liver ROS, GERD  Medicated,  Endo/Other  negative endocrine ROS  Renal/GU Dialysis and ESRFRenal disease     Musculoskeletal negative musculoskeletal ROS (+)   Abdominal Normal abdominal exam  (+)   Peds  Hematology negative hematology ROS (+)   Anesthesia Other Findings - SLE  Reproductive/Obstetrics                            Lab Results  Component Value Date   WBC 3.4 (L) 08/01/2018   HGB 7.8 Repeated and verified X2. (LL) 08/01/2018   HCT 23.4 Repeated and verified X2. (LL) 08/01/2018   MCV 83.2 08/01/2018   PLT 154.0 08/01/2018   Lab Results  Component Value Date   CREATININE 12.92 (H) 03/21/2016   BUN 36 (H) 03/21/2016   NA 139 05/23/2016   K 4.4 05/23/2016   CL 92 (L) 03/21/2016   CO2 27 03/21/2016   Lab Results  Component Value Date   INR 1.34 03/20/2016   INR 0.98 11/17/2013   INR 1.1 ratio (H) 07/12/2010   EKG: normal sinus rhythm.  Anesthesia Physical Anesthesia Plan  ASA: III  Anesthesia Plan: MAC   Post-op Pain Management:    Induction: Intravenous  PONV Risk Score and Plan: 2 and Propofol infusion and Ondansetron  Airway Management Planned: Natural Airway and Nasal Cannula  Additional Equipment: None  Intra-op Plan:   Post-operative Plan:   Informed Consent: I have reviewed the patients History and Physical, chart, labs and discussed  the procedure including the risks, benefits and alternatives for the proposed anesthesia with the patient or authorized representative who has indicated his/her understanding and acceptance.     Plan Discussed with: CRNA  Anesthesia Plan Comments:        Anesthesia Quick Evaluation

## 2018-08-12 NOTE — Transfer of Care (Signed)
Immediate Anesthesia Transfer of Care Note  Patient: [name] [name]  Procedure(s) Performed: ESOPHAGOGASTRODUODENOSCOPY (EGD) WITH PROPOFOL (N/A ) COLONOSCOPY WITH PROPOFOL (N/A ) BIOPSY  Patient Location: Endoscopy Unit  Anesthesia Type:MAC  Level of Consciousness: drowsy  Airway & Oxygen Therapy: Patient Spontanous Breathing and Patient connected to nasal cannula oxygen  Post-op Assessment: Report given to RN and Post -op Vital signs reviewed and stable  Post vital signs: Reviewed and stable  Last Vitals:  Vitals Value Taken Time  BP    Temp    Pulse    Resp    SpO2      Last Pain:  Vitals:   08/12/18 0649  TempSrc: Oral  PainSc: 0-No pain         Complications: No apparent anesthesia complications

## 2018-08-12 NOTE — Op Note (Signed)
[facility] Patient Name: [name] [name] Procedure Date: 08/12/2018 MRN: [id number] Attending MD: Ladene Artist , MD Date of Birth: 03/02/1981 CSN: [id number] Age: 37 Admit Type: Outpatient Procedure:                Upper GI endoscopy Indications:              Suspected gastroesophageal reflux disease, Occult                            blood in stool, Anemia Providers:                Pricilla Riffle. Fuller Plan, MD, Angus Seller, Tinnie Gens,                            Technician, Adair Laundry, CRNA Referring MD:             Jamal Maes, MD Medicines:                Monitored Anesthesia Care Complications:            No immediate complications. Estimated Blood Loss:     Estimated blood loss was minimal. Procedure:                Pre-Anesthesia Assessment:                           - Prior to the procedure, a History and Physical                            was performed, and patient medications and                            allergies were reviewed. The patient's tolerance of                            previous anesthesia was also reviewed. The risks                            and benefits of the procedure and the sedation                            options and risks were discussed with the patient.                            All questions were answered, and informed consent                            was obtained. Prior Anticoagulants: The patient has                            taken no previous anticoagulant or antiplatelet                            agents. ASA Grade Assessment: III - A patient with  severe systemic disease. After reviewing the risks                            and benefits, the patient was deemed in                            satisfactory condition to undergo the procedure.                           - Prior to the procedure, a History and Physical                            was performed, and patient medications and                       allergies were reviewed. The patient's tolerance of                            previous anesthesia was also reviewed. The risks                            and benefits of the procedure and the sedation                            options and risks were discussed with the patient.                            All questions were answered, and informed consent                            was obtained. Prior Anticoagulants: The patient has                            taken no previous anticoagulant or antiplatelet                            agents. ASA Grade Assessment: III - A patient with                            severe systemic disease. After reviewing the risks                            and benefits, the patient was deemed in                            satisfactory condition to undergo the procedure.                           After obtaining informed consent, the endoscope was                            passed under direct vision. Throughout the  procedure, the patient's blood pressure, pulse, and                            oxygen saturations were monitored continuously. The                            [id number]-H190 ([id number]) Olympus Adult Endoscope was                            introduced through the mouth, and advanced to the                            second part of duodenum. The upper GI endoscopy was                            accomplished without difficulty. The patient                            tolerated the procedure well. Scope In: Scope Out: Findings:      The examined esophagus was normal.      A few 3 to 4 mm sessile polyps with no bleeding and no stigmata of       recent bleeding were found in the gastric body. Biopsies were taken with       a cold forceps for histology.      The exam of the stomach was otherwise normal.      Localized mild inflammation characterized by congestion (edema),       erythema and friability was found in the  second portion of the duodenum.      The exam of the duodenum was otherwise normal. Impression:               - Normal esophagus.                           - A few gastric polyps. Biopsied.                           - Duodenitis. Moderate Sedation:      N/A- Per Anesthesia Care Recommendation:           - Patient has a contact number available for                            emergencies. The signs and symptoms of potential                            delayed complications were discussed with the                            patient. Return to normal activities tomorrow.                            Written discharge instructions were provided to the                            patient.                           -  Resume previous diet.                           - Continue present medications.                           - Await pathology results. Procedure Code(s):        --- Professional ---                           [phone], Esophagogastroduodenoscopy, flexible,                            transoral; with biopsy, single or multiple Diagnosis Code(s):        --- Professional ---                           K31.7, Polyp of stomach and duodenum                           K29.80, Duodenitis without bleeding                           R19.5, Other fecal abnormalities CPT copyright 2018 [employer]. All rights reserved. The codes documented in this report are preliminary and upon coder review may  be revised to meet current compliance requirements. Ladene Artist, MD 08/12/2018 8:29:31 AM This report has been signed electronically. Number of Addenda: 0

## 2018-08-12 NOTE — Interval H&P Note (Signed)
History and Physical Interval Note:  08/12/2018 7:36 AM  [name] [name]  has presented today for surgery, with the diagnosis of acute blood loss anemia, heme pos. stool  The various methods of treatment have been discussed with the patient and family. After consideration of risks, benefits and other options for treatment, the patient has consented to  Procedure(s): ESOPHAGOGASTRODUODENOSCOPY (EGD) WITH PROPOFOL (N/A) COLONOSCOPY WITH PROPOFOL (N/A) as a surgical intervention .  The patient's history has been reviewed, patient examined, no change in status, stable for surgery.  I have reviewed the patient's chart and labs.  Questions were answered to the patient's satisfaction.     Pricilla Riffle. Fuller Plan

## 2018-08-13 DIAGNOSIS — N2581 Secondary hyperparathyroidism of renal origin: Secondary | ICD-10-CM | POA: Diagnosis not present

## 2018-08-13 DIAGNOSIS — Z23 Encounter for immunization: Secondary | ICD-10-CM | POA: Diagnosis not present

## 2018-08-13 DIAGNOSIS — D649 Anemia, unspecified: Secondary | ICD-10-CM | POA: Diagnosis not present

## 2018-08-13 DIAGNOSIS — N186 End stage renal disease: Secondary | ICD-10-CM | POA: Diagnosis not present

## 2018-08-13 LAB — POCT I-STAT 4, (NA,K, GLUC, HGB,HCT)
Glucose, Bld: 77 mg/dL (ref 70–99)
HEMATOCRIT: 24 % — AB (ref 36.0–46.0)
HEMOGLOBIN: 8.2 g/dL — AB (ref 12.0–15.0)
Potassium: 5.4 mmol/L — ABNORMAL HIGH (ref 3.5–5.1)
SODIUM: 132 mmol/L — AB (ref 135–145)

## 2018-08-14 ENCOUNTER — Encounter (HOSPITAL_COMMUNITY): Payer: Self-pay | Admitting: Gastroenterology

## 2018-08-14 DIAGNOSIS — D649 Anemia, unspecified: Secondary | ICD-10-CM | POA: Diagnosis not present

## 2018-08-14 DIAGNOSIS — Z23 Encounter for immunization: Secondary | ICD-10-CM | POA: Diagnosis not present

## 2018-08-14 DIAGNOSIS — N2581 Secondary hyperparathyroidism of renal origin: Secondary | ICD-10-CM | POA: Diagnosis not present

## 2018-08-14 DIAGNOSIS — N186 End stage renal disease: Secondary | ICD-10-CM | POA: Diagnosis not present

## 2018-08-16 ENCOUNTER — Telehealth: Payer: Self-pay | Admitting: Internal Medicine

## 2018-08-16 DIAGNOSIS — D649 Anemia, unspecified: Secondary | ICD-10-CM | POA: Diagnosis not present

## 2018-08-16 DIAGNOSIS — N186 End stage renal disease: Secondary | ICD-10-CM | POA: Diagnosis not present

## 2018-08-16 DIAGNOSIS — N2581 Secondary hyperparathyroidism of renal origin: Secondary | ICD-10-CM | POA: Diagnosis not present

## 2018-08-16 DIAGNOSIS — Z23 Encounter for immunization: Secondary | ICD-10-CM | POA: Diagnosis not present

## 2018-08-16 NOTE — Telephone Encounter (Signed)
EGD with biopsy and colonoscopy 10/21 by Dr. Sherral Hammers had bad cramps after dialysis the next day and has been having diarrhea.  It seems to be better not bloody no fever no severe cramps now and is using Imodium and wanted to know if that was okay.  Told her to continue the Imodium as needed, if she fails to resolve over the next few days or worsens before then call us back.  1 possibility is she developed some transient ischemia on the dialysis day following the colonoscopy, could have been somewhat dehydrated related to the colon prep etc. and predisposed to that.

## 2018-08-19 DIAGNOSIS — N2581 Secondary hyperparathyroidism of renal origin: Secondary | ICD-10-CM | POA: Diagnosis not present

## 2018-08-19 DIAGNOSIS — Z23 Encounter for immunization: Secondary | ICD-10-CM | POA: Diagnosis not present

## 2018-08-19 DIAGNOSIS — N186 End stage renal disease: Secondary | ICD-10-CM | POA: Diagnosis not present

## 2018-08-19 DIAGNOSIS — D649 Anemia, unspecified: Secondary | ICD-10-CM | POA: Diagnosis not present

## 2018-08-21 DIAGNOSIS — N186 End stage renal disease: Secondary | ICD-10-CM | POA: Diagnosis not present

## 2018-08-21 DIAGNOSIS — D649 Anemia, unspecified: Secondary | ICD-10-CM | POA: Diagnosis not present

## 2018-08-21 DIAGNOSIS — Z23 Encounter for immunization: Secondary | ICD-10-CM | POA: Diagnosis not present

## 2018-08-21 DIAGNOSIS — N2581 Secondary hyperparathyroidism of renal origin: Secondary | ICD-10-CM | POA: Diagnosis not present

## 2018-08-23 DIAGNOSIS — N2581 Secondary hyperparathyroidism of renal origin: Secondary | ICD-10-CM | POA: Diagnosis not present

## 2018-08-23 DIAGNOSIS — I158 Other secondary hypertension: Secondary | ICD-10-CM | POA: Diagnosis not present

## 2018-08-23 DIAGNOSIS — D649 Anemia, unspecified: Secondary | ICD-10-CM | POA: Diagnosis not present

## 2018-08-23 DIAGNOSIS — Z992 Dependence on renal dialysis: Secondary | ICD-10-CM | POA: Diagnosis not present

## 2018-08-23 DIAGNOSIS — N186 End stage renal disease: Secondary | ICD-10-CM | POA: Diagnosis not present

## 2018-08-26 DIAGNOSIS — N186 End stage renal disease: Secondary | ICD-10-CM | POA: Diagnosis not present

## 2018-08-26 DIAGNOSIS — D649 Anemia, unspecified: Secondary | ICD-10-CM | POA: Diagnosis not present

## 2018-08-26 DIAGNOSIS — N2581 Secondary hyperparathyroidism of renal origin: Secondary | ICD-10-CM | POA: Diagnosis not present

## 2018-08-27 ENCOUNTER — Encounter: Payer: Self-pay | Admitting: Gastroenterology

## 2018-08-28 DIAGNOSIS — N186 End stage renal disease: Secondary | ICD-10-CM | POA: Diagnosis not present

## 2018-08-28 DIAGNOSIS — N2581 Secondary hyperparathyroidism of renal origin: Secondary | ICD-10-CM | POA: Diagnosis not present

## 2018-08-28 DIAGNOSIS — D649 Anemia, unspecified: Secondary | ICD-10-CM | POA: Diagnosis not present

## 2018-08-30 DIAGNOSIS — N2581 Secondary hyperparathyroidism of renal origin: Secondary | ICD-10-CM | POA: Diagnosis not present

## 2018-08-30 DIAGNOSIS — N186 End stage renal disease: Secondary | ICD-10-CM | POA: Diagnosis not present

## 2018-08-30 DIAGNOSIS — D649 Anemia, unspecified: Secondary | ICD-10-CM | POA: Diagnosis not present

## 2018-09-02 DIAGNOSIS — N2581 Secondary hyperparathyroidism of renal origin: Secondary | ICD-10-CM | POA: Diagnosis not present

## 2018-09-02 DIAGNOSIS — D649 Anemia, unspecified: Secondary | ICD-10-CM | POA: Diagnosis not present

## 2018-09-02 DIAGNOSIS — N186 End stage renal disease: Secondary | ICD-10-CM | POA: Diagnosis not present

## 2018-09-04 DIAGNOSIS — N186 End stage renal disease: Secondary | ICD-10-CM | POA: Diagnosis not present

## 2018-09-04 DIAGNOSIS — N2581 Secondary hyperparathyroidism of renal origin: Secondary | ICD-10-CM | POA: Diagnosis not present

## 2018-09-04 DIAGNOSIS — D649 Anemia, unspecified: Secondary | ICD-10-CM | POA: Diagnosis not present

## 2018-09-06 DIAGNOSIS — N186 End stage renal disease: Secondary | ICD-10-CM | POA: Diagnosis not present

## 2018-09-06 DIAGNOSIS — D649 Anemia, unspecified: Secondary | ICD-10-CM | POA: Diagnosis not present

## 2018-09-06 DIAGNOSIS — N2581 Secondary hyperparathyroidism of renal origin: Secondary | ICD-10-CM | POA: Diagnosis not present

## 2018-09-09 ENCOUNTER — Telehealth: Payer: Self-pay | Admitting: Family

## 2018-09-09 DIAGNOSIS — N186 End stage renal disease: Secondary | ICD-10-CM | POA: Diagnosis not present

## 2018-09-09 DIAGNOSIS — D649 Anemia, unspecified: Secondary | ICD-10-CM | POA: Diagnosis not present

## 2018-09-09 DIAGNOSIS — N2581 Secondary hyperparathyroidism of renal origin: Secondary | ICD-10-CM | POA: Diagnosis not present

## 2018-09-09 NOTE — Telephone Encounter (Signed)
Spoke with pt to confirm new patient appt and location 09/26/18 at 11 am

## 2018-09-11 DIAGNOSIS — N2581 Secondary hyperparathyroidism of renal origin: Secondary | ICD-10-CM | POA: Diagnosis not present

## 2018-09-11 DIAGNOSIS — N186 End stage renal disease: Secondary | ICD-10-CM | POA: Diagnosis not present

## 2018-09-11 DIAGNOSIS — D649 Anemia, unspecified: Secondary | ICD-10-CM | POA: Diagnosis not present

## 2018-09-13 ENCOUNTER — Encounter: Payer: Self-pay | Admitting: Family Medicine

## 2018-09-13 ENCOUNTER — Ambulatory Visit (INDEPENDENT_AMBULATORY_CARE_PROVIDER_SITE_OTHER): Payer: Medicare Other | Admitting: Family Medicine

## 2018-09-13 VITALS — BP 108/62 | HR 78 | Temp 99.3°F | Ht 67.0 in | Wt 184.6 lb

## 2018-09-13 DIAGNOSIS — R110 Nausea: Secondary | ICD-10-CM

## 2018-09-13 DIAGNOSIS — N186 End stage renal disease: Secondary | ICD-10-CM | POA: Diagnosis not present

## 2018-09-13 DIAGNOSIS — N2581 Secondary hyperparathyroidism of renal origin: Secondary | ICD-10-CM | POA: Diagnosis not present

## 2018-09-13 DIAGNOSIS — D649 Anemia, unspecified: Secondary | ICD-10-CM | POA: Diagnosis not present

## 2018-09-13 DIAGNOSIS — Z6828 Body mass index (BMI) 28.0-28.9, adult: Secondary | ICD-10-CM | POA: Diagnosis not present

## 2018-09-13 MED ORDER — ONDANSETRON 4 MG PO TBDP: 4.0000 mg | ORAL_TABLET | Freq: Three times a day (TID) | ORAL | 2 refills | Status: AC | PRN

## 2018-09-13 NOTE — Progress Notes (Signed)
Subjective:  [name] [name] is a 37 y.o. year old very pleasant female patient who presents for/with See problem oriented charting ROS-continued fatigue, shortness of breath.  Occasional chest pain-better with rest.  No fever chills, cough or congestion.  No pleuritic chest pain anymore.  Past Medical History-  Patient Active Problem List   Diagnosis Date Noted  . ESRD on dialysis [facility]) 08/05/2015    Priority: High  . Essential hypertension 09/22/2010    Priority: High  . Secondary cardiomyopathy ([place]) 07/14/2009    Priority: High  . LUPUS NEPHRITIS 07/14/2009    Priority: High  . Anemia 08/07/2006    Priority: High  . ERYTHEMATOSUS, LUPUS 08/07/2006    Priority: High  . History of recurrent cellulitis of lower leg 03/09/2016    Priority: Medium  . Gout 08/14/2012    Priority: Medium  . Pulmonary hypertension ([place]) 07/28/2010    Priority: Medium  . History of TIA (transient ischemic attack) 07/14/2009    Priority: Medium  . Onychomycosis 12/02/2014    Priority: Low  . Gastroesophageal reflux disease   . Acute blood loss anemia 07/25/2018  . Heme positive stool 07/25/2018  . Acquired hallux valgus of right foot 06/15/2017  . 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 Plantar fasciitis of right foot 06/15/2017  . Atypical chest pain 11/27/2016  . CKD (chronic kidney disease) stage V requiring chronic peritoneal dialysis  03/17/2016  . Intractable nausea and vomiting 02/20/2016  . Enteritis due to Clostridium difficile 05/03/2015    Medications- reviewed and updated Current Outpatient Medications  Medication Sig Dispense Refill  . amLODipine (NORVASC) 10 MG tablet Take 10 mg by mouth every evening.   4  . b complex-vitamin c-folic acid (NEPHRO-VITE) 0.8 MG TABS tablet Take 1 tablet by mouth daily.    . carvedilol (COREG) 25 MG tablet Take 25 mg by mouth 2 (two) times daily.   9  . cloNIDine (CATAPRES) 0.1 MG tablet Take 0.1 mg by mouth 2 (two) times daily.  0  .  hydroxychloroquine (PLAQUENIL) 200 MG tablet TAKE 1 TABLET BY MOUTH TWICE A DAY (Patient taking differently: Take 100-200 mg by mouth See admin instructions. Take 1 tablet (200 mg) by mouth daily in the morning & take 0.5 tablet (100 mg) by mouth at night.) 60 tablet 1  . lidocaine-prilocaine (EMLA) cream Apply 1 application topically daily as needed.    Marland Kitchen losartan (COZAAR) 100 MG tablet Take 100 mg by mouth every evening.   11  . macitentan (OPSUMIT) 10 MG tablet Take 10 mg by mouth daily.    . pantoprazole (PROTONIX) 40 MG tablet Take 1 tablet (40 mg total) by mouth daily. 60 tablet 1  . predniSONE (DELTASONE) 10 MG tablet Take 10 mg by mouth at bedtime.   12  . RENVELA 800 MG tablet Take 800-3,200 mg by mouth as directed. Take 3-4 tablets by mouth daily with each meals & take 1-2 tablets by mouth daily with each snack.  11  . triamcinolone ointment (KENALOG) 0.1 % Apply 1 application topically daily as needed (FOR RASH/SKIN IRRITATION.).    Marland Kitchen ondansetron (ZOFRAN-ODT) 4 MG disintegrating tablet Take 1 tablet (4 mg total) by mouth every 8 (eight) hours as needed for nausea or vomiting. 30 tablet 2   No current facility-administered medications for this visit.     Objective: BP 108/62 (BP Location: Left Arm, Patient Position: Sitting, Cuff Size: Large)   Pulse 78   Temp 99.3 F (37.4 C) (Oral)   Ht 5'  7" (1.702 m)   Wt 184 lb 9.6 oz (83.7 kg)   SpO2 100%   BMI 28.91 kg/m  Gen: NAD, resting comfortably Mucous membrane pallor noted CV: RRR no murmurs rubs or gallops Lungs: CTAB no crackles, wheeze, rhonchi Abdomen: soft/nontender/nondistended/normal bowel sounds.  Ext: no edema Skin: warm, dry  Assessment/Plan:  Other notes: 1.pleuritic pain seemed to resolve after colonoscopy interestingly.  Prior had been worked up for pleurisy. 2.  Pap smear scheduled 10/10/2018  Anemia Nausea without vomiting S: Patient with continued issues with hemoglobin being low/anemia.Highest EPO and  hgb staying at 7.2 despite treatment and also had transfusion on 08/14/18- transiently went up.  She reports getting iron infusions.  Last check on Wednesday showed hemoglobin 7.2 once again.   EGD and colonoscopy were unrevealing.Capsule study is planned.  Patient also has a visit upcoming with her hematologist.  Patient continues to have shortness of breath and some chest pain in regards to the anemia.  She also has some nausea and this can be exertional but can also be at rest-Has been on nausea medicine in the past-think she tolerated Zofran well. A/P: Appears patient's nephrologist is doing a great job working this up.  I walked patient through thought process for the different evaluations-discussed possibility of bone marrow biopsy if no obvious cause is found..  She is in agreement to continue.  For her nausea since she has been anemic-I did give her a prescription for Zofran.  She is not working right now and I think that is appropriate with how profound her anemia is until we can get her in a better position.  Future Appointments  Date Time Provider [place]  09/26/2018  9:00 AM Ladene Artist, MD [facility]  09/26/2018 11:00 AM [facility] None  09/26/2018 11:15 AM [place], Holli Humbles, NP [facility] None    Meds ordered this encounter  Medications  . ondansetron (ZOFRAN-ODT) 4 MG disintegrating tablet    Sig: Take 1 tablet (4 mg total) by mouth every 8 (eight) hours as needed for nausea or vomiting.    Dispense:  30 tablet    Refill:  2    Return precautions advised.  Garret Reddish, MD

## 2018-09-13 NOTE — Patient Instructions (Addendum)
Health Maintenance Due  Topic Date Due  . PAP SMEAR -scheduled 10/10/18 07/09/2017   Lets try ondansetron for nausea as that has helped in the past  Thanks for the update on anemia workup- let me know if I can be of any assistance but sounds like Dr. Lorrene Reid has done an amazing job.

## 2018-09-14 NOTE — Assessment & Plan Note (Addendum)
S: Patient with continued issues with hemoglobin being low/anemia.Highest EPO and hgb staying at 7.2 despite treatment and also had transfusion on 08/14/18- transiently went up.  She reports getting iron infusions.  Last check on Wednesday showed hemoglobin 7.2 once again.   EGD and colonoscopy were unrevealing.Capsule study is planned.  Patient also has a visit upcoming with her hematologist.  Patient continues to have shortness of breath and some chest pain in regards to the anemia.  She also has some nausea and this can be exertional but can also be at rest-Has been on nausea medicine in the past-think she tolerated Zofran well. A/P: Appears patient's nephrologist is doing a great job working this up.  I walked patient through thought process for the different evaluations-discussed possibility of bone marrow biopsy if no obvious cause is found..  She is in agreement to continue.  For her nausea since she has been anemic-I did give her a prescription for Zofran.  She is not working right now and I think that is appropriate with how profound her anemia is until we can get her in a better position. 

## 2018-09-15 DIAGNOSIS — D649 Anemia, unspecified: Secondary | ICD-10-CM | POA: Diagnosis not present

## 2018-09-15 DIAGNOSIS — N2581 Secondary hyperparathyroidism of renal origin: Secondary | ICD-10-CM | POA: Diagnosis not present

## 2018-09-15 DIAGNOSIS — N186 End stage renal disease: Secondary | ICD-10-CM | POA: Diagnosis not present

## 2018-09-17 DIAGNOSIS — D649 Anemia, unspecified: Secondary | ICD-10-CM | POA: Diagnosis not present

## 2018-09-17 DIAGNOSIS — N186 End stage renal disease: Secondary | ICD-10-CM | POA: Diagnosis not present

## 2018-09-17 DIAGNOSIS — N2581 Secondary hyperparathyroidism of renal origin: Secondary | ICD-10-CM | POA: Diagnosis not present

## 2018-09-20 DIAGNOSIS — N2581 Secondary hyperparathyroidism of renal origin: Secondary | ICD-10-CM | POA: Diagnosis not present

## 2018-09-20 DIAGNOSIS — D649 Anemia, unspecified: Secondary | ICD-10-CM | POA: Diagnosis not present

## 2018-09-20 DIAGNOSIS — N186 End stage renal disease: Secondary | ICD-10-CM | POA: Diagnosis not present

## 2018-09-22 DIAGNOSIS — I158 Other secondary hypertension: Secondary | ICD-10-CM | POA: Diagnosis not present

## 2018-09-22 DIAGNOSIS — Z992 Dependence on renal dialysis: Secondary | ICD-10-CM | POA: Diagnosis not present

## 2018-09-22 DIAGNOSIS — N186 End stage renal disease: Secondary | ICD-10-CM | POA: Diagnosis not present

## 2018-09-23 ENCOUNTER — Telehealth: Payer: Self-pay | Admitting: Family

## 2018-09-23 DIAGNOSIS — N2581 Secondary hyperparathyroidism of renal origin: Secondary | ICD-10-CM | POA: Diagnosis not present

## 2018-09-23 DIAGNOSIS — N186 End stage renal disease: Secondary | ICD-10-CM | POA: Diagnosis not present

## 2018-09-23 DIAGNOSIS — D649 Anemia, unspecified: Secondary | ICD-10-CM | POA: Diagnosis not present

## 2018-09-23 NOTE — Telephone Encounter (Signed)
I spoke with patient regarding r/s his appointments from 12/5 due to provider out of office/bereavement. New date/time ok per patient

## 2018-09-24 ENCOUNTER — Other Ambulatory Visit: Payer: Self-pay | Admitting: Family

## 2018-09-24 DIAGNOSIS — D649 Anemia, unspecified: Secondary | ICD-10-CM

## 2018-09-25 ENCOUNTER — Inpatient Hospital Stay: Payer: Medicare Other | Attending: Hematology & Oncology

## 2018-09-25 ENCOUNTER — Inpatient Hospital Stay (HOSPITAL_BASED_OUTPATIENT_CLINIC_OR_DEPARTMENT_OTHER): Payer: Medicare Other | Admitting: Family

## 2018-09-25 ENCOUNTER — Telehealth: Payer: Self-pay | Admitting: *Deleted

## 2018-09-25 VITALS — BP 106/61 | HR 86 | Temp 98.0°F | Resp 18 | Ht 67.0 in | Wt 181.8 lb

## 2018-09-25 DIAGNOSIS — Z8042 Family history of malignant neoplasm of prostate: Secondary | ICD-10-CM | POA: Diagnosis not present

## 2018-09-25 DIAGNOSIS — Z8619 Personal history of other infectious and parasitic diseases: Secondary | ICD-10-CM

## 2018-09-25 DIAGNOSIS — I129 Hypertensive chronic kidney disease with stage 1 through stage 4 chronic kidney disease, or unspecified chronic kidney disease: Secondary | ICD-10-CM

## 2018-09-25 DIAGNOSIS — K648 Other hemorrhoids: Secondary | ICD-10-CM

## 2018-09-25 DIAGNOSIS — R002 Palpitations: Secondary | ICD-10-CM | POA: Insufficient documentation

## 2018-09-25 DIAGNOSIS — Z992 Dependence on renal dialysis: Secondary | ICD-10-CM

## 2018-09-25 DIAGNOSIS — K5730 Diverticulosis of large intestine without perforation or abscess without bleeding: Secondary | ICD-10-CM | POA: Diagnosis not present

## 2018-09-25 DIAGNOSIS — M3214 Glomerular disease in systemic lupus erythematosus: Secondary | ICD-10-CM

## 2018-09-25 DIAGNOSIS — I509 Heart failure, unspecified: Secondary | ICD-10-CM | POA: Insufficient documentation

## 2018-09-25 DIAGNOSIS — Z809 Family history of malignant neoplasm, unspecified: Secondary | ICD-10-CM | POA: Insufficient documentation

## 2018-09-25 DIAGNOSIS — R0789 Other chest pain: Secondary | ICD-10-CM | POA: Insufficient documentation

## 2018-09-25 DIAGNOSIS — D538 Other specified nutritional anemias: Secondary | ICD-10-CM | POA: Insufficient documentation

## 2018-09-25 DIAGNOSIS — Z8673 Personal history of transient ischemic attack (TIA), and cerebral infarction without residual deficits: Secondary | ICD-10-CM

## 2018-09-25 DIAGNOSIS — Z79899 Other long term (current) drug therapy: Secondary | ICD-10-CM | POA: Diagnosis not present

## 2018-09-25 DIAGNOSIS — N186 End stage renal disease: Secondary | ICD-10-CM | POA: Diagnosis not present

## 2018-09-25 DIAGNOSIS — Z803 Family history of malignant neoplasm of breast: Secondary | ICD-10-CM

## 2018-09-25 DIAGNOSIS — G4730 Sleep apnea, unspecified: Secondary | ICD-10-CM

## 2018-09-25 DIAGNOSIS — D649 Anemia, unspecified: Secondary | ICD-10-CM

## 2018-09-25 DIAGNOSIS — N189 Chronic kidney disease, unspecified: Secondary | ICD-10-CM

## 2018-09-25 DIAGNOSIS — D62 Acute posthemorrhagic anemia: Secondary | ICD-10-CM

## 2018-09-25 DIAGNOSIS — N2581 Secondary hyperparathyroidism of renal origin: Secondary | ICD-10-CM | POA: Diagnosis not present

## 2018-09-25 LAB — CMP (CANCER CENTER ONLY)
ALK PHOS: 74 U/L (ref 38–126)
ALT: 14 U/L (ref 0–44)
AST: 18 U/L (ref 15–41)
Albumin: 4.2 g/dL (ref 3.5–5.0)
Anion gap: 9 (ref 5–15)
BILIRUBIN TOTAL: 0.5 mg/dL (ref 0.3–1.2)
BUN: 14 mg/dL (ref 6–20)
CO2: 37 mmol/L — ABNORMAL HIGH (ref 22–32)
Calcium: 8.5 mg/dL — ABNORMAL LOW (ref 8.9–10.3)
Chloride: 95 mmol/L — ABNORMAL LOW (ref 98–111)
Creatinine: 5.19 mg/dL (ref 0.44–1.00)
GFR, Est AFR Am: 11 mL/min — ABNORMAL LOW (ref >60)
GFR, Estimated: 10 mL/min — ABNORMAL LOW (ref >60)
Glucose, Bld: 89 mg/dL (ref 70–99)
Potassium: 3.3 mmol/L — ABNORMAL LOW (ref 3.5–5.1)
Sodium: 141 mmol/L (ref 135–145)
TOTAL PROTEIN: 8.1 g/dL (ref 6.5–8.1)

## 2018-09-25 LAB — CBC WITH DIFFERENTIAL (CANCER CENTER ONLY)
Abs Immature Granulocytes: 0.01 10*3/uL (ref 0.00–0.07)
Basophils Absolute: 0 10*3/uL (ref 0.0–0.1)
Basophils Relative: 0 %
Eosinophils Absolute: 0.1 10*3/uL (ref 0.0–0.5)
Eosinophils Relative: 4 %
HCT: 26.8 % — ABNORMAL LOW (ref 36.0–46.0)
Hemoglobin: 8.4 g/dL — ABNORMAL LOW (ref 12.0–15.0)
Immature Granulocytes: 0 %
Lymphocytes Relative: 22 %
Lymphs Abs: 0.6 10*3/uL — ABNORMAL LOW (ref 0.7–4.0)
MCH: 29.2 pg (ref 26.0–34.0)
MCHC: 31.3 g/dL (ref 30.0–36.0)
MCV: 93.1 fL (ref 80.0–100.0)
Monocytes Absolute: 0.3 10*3/uL (ref 0.1–1.0)
Monocytes Relative: 10 %
NRBC: 0 % (ref 0.0–0.2)
Neutro Abs: 1.8 10*3/uL (ref 1.7–7.7)
Neutrophils Relative %: 64 %
Platelet Count: 166 10*3/uL (ref 150–400)
RBC: 2.88 MIL/uL — ABNORMAL LOW (ref 3.87–5.11)
RDW: 13.5 % (ref 11.5–15.5)
WBC Count: 2.8 10*3/uL — ABNORMAL LOW (ref 4.0–10.5)

## 2018-09-25 LAB — LACTATE DEHYDROGENASE: LDH: 166 U/L (ref 98–192)

## 2018-09-25 LAB — RETICULOCYTES
Immature Retic Fract: 3.8 % (ref 2.3–15.9)
RBC.: 2.88 MIL/uL — ABNORMAL LOW (ref 3.87–5.11)
Retic Count, Absolute: 41.8 10*3/uL (ref 19.0–186.0)
Retic Ct Pct: 1.5 % (ref 0.4–3.1)

## 2018-09-25 LAB — SAVE SMEAR(SSMR), FOR PROVIDER SLIDE REVIEW

## 2018-09-25 NOTE — Telephone Encounter (Signed)
Critical Value Creatinine 5.19 Laverna Peace NP notified. No orders at this time.

## 2018-09-25 NOTE — Progress Notes (Signed)
Hematology/Oncology Consultation   Name: [name] [name]      MRN: [id number]    Location: Room/bed info not found  Date: 09/25/2018 Time:1:48 PM   REFERRING PHYSICIAN: Jamal Maes, MD  REASON FOR CONSULT: Non-responsive to ESA (Mircera 225 mcg IVPB evert 2 weeks with dialysis)   DIAGNOSIS:  Erythropoietin deficiency anemia   HISTORY OF PRESENT ILLNESS: [name] [name] is a very pleasant 37 yo African Amercan female with lupus nephritis. She started hemodialysis 3 years ago. She is now Monday, Wednesday and Friday.  She does still make a little urine each day.  She is currently on the transplant list waiting for a match.  She has been receiving Mircera 225 mcg IVPB with dialysis every 2 weeks. This has not been effective in improving her anemia. Hgb at this time is 8.4 with an MCV of 93.  She has received blood and IV iron in the past to treat her anemia.  She is symptomatic with fatigue. She has intermittent chest pain and palpitations.  She has occasional dizziness and SOB she feels may be due to pulmonary HTN.  She states that her mother also has Lupus nephritis and is on hemodialysis.  She has an irregular cycle which is light. She has not had any other bleeding. No bruising or petechiae.  No sickle cell disease of trait.  No personal cancer history. Familial history includes maternal grandmother with an unknown primary and paternal grandfather with prostate cancer.  She had an endoscopy and colonoscopy in October. She had several small polyps biopsied in the gastric body which were benign. She also had diverticulosis in the left colon as well as internal hemorrhoids. They are considering doing a capsule study.  No fever, chills, n/v, cough, rash, dizziness, SOB, chest pain, palpitations, abdominal pain or changes in bowel habits.  No c/o feeling bloated.  No swelling, tenderness, numbness or tingling in her extremities.  No lymphadenopathy noted on exam.  She has maintained a good  appetite and is hydrating well. Her weight is stable.  She does not smoke or drink alcohol.  She is not currently employed.   ROS: All other 10 point review of systems is negative.   PAST MEDICAL HISTORY:   Past Medical History:  Diagnosis Date  . CHF (congestive heart failure) ([place]) 5-6 yrs ago  hosp  . Chronic kidney disease   . ERYTHEMATOSUS, LUPUS 08/07/2006  . Hypertension   . Intestinal infection due to Clostridium difficile   . Secondary cardiomyopathy, unspecified   . Sleep apnea    does not wear CPAP  . Systemic lupus erythematosus ([place])   . TIA (transient ischemic attack)   . Unspecified deficiency anemia   . Unspecified essential hypertension     ALLERGIES: Allergies  Allergen Reactions  . Lisinopril Swelling    SWELLING OF TOP LIP  . Nsaids Other (See Comments)    UNKNOWN REACTION-MD TOLD PATIENT NOT TO TAKE She should avoid all due to renal insufficiency      MEDICATIONS:  Current Outpatient Medications on File Prior to Visit  Medication Sig Dispense Refill  . amLODipine (NORVASC) 10 MG tablet Take 10 mg by mouth every evening.   4  . b complex-vitamin c-folic acid (NEPHRO-VITE) 0.8 MG TABS tablet Take 1 tablet by mouth daily.    . carvedilol (COREG) 25 MG tablet Take 25 mg by mouth 2 (two) times daily.   9  . cloNIDine (CATAPRES) 0.1 MG tablet Take 0.1 mg by mouth 2 (two) times  daily.  0  . hydroxychloroquine (PLAQUENIL) 200 MG tablet TAKE 1 TABLET BY MOUTH TWICE A DAY (Patient taking differently: Take 100-200 mg by mouth See admin instructions. Take 1 tablet (200 mg) by mouth daily in the morning & take 0.5 tablet (100 mg) by mouth at night.) 60 tablet 1  . lidocaine-prilocaine (EMLA) cream Apply 1 application topically daily as needed.    Marland Kitchen losartan (COZAAR) 100 MG tablet Take 100 mg by mouth every evening.   11  . macitentan (OPSUMIT) 10 MG tablet Take 10 mg by mouth daily.    . ondansetron (ZOFRAN-ODT) 4 MG disintegrating tablet Take 1 tablet (4 mg total)  by mouth every 8 (eight) hours as needed for nausea or vomiting. 30 tablet 2  . pantoprazole (PROTONIX) 40 MG tablet Take 1 tablet (40 mg total) by mouth daily. 60 tablet 1  . predniSONE (DELTASONE) 10 MG tablet Take 10 mg by mouth at bedtime.   12  . RENVELA 800 MG tablet Take 800-3,200 mg by mouth as directed. Take 3-4 tablets by mouth daily with each meals & take 1-2 tablets by mouth daily with each snack.  11  . triamcinolone ointment (KENALOG) 0.1 % Apply 1 application topically daily as needed (FOR RASH/SKIN IRRITATION.).     No current facility-administered medications on file prior to visit.      PAST SURGICAL HISTORY Past Surgical History:  Procedure Laterality Date  . AV FISTULA PLACEMENT Right 05/23/2016   Procedure: ARTERIOVENOUS (AV) FISTULA CREATION VERSUS GRAFT INSERTION;  Surgeon: Angelia Mould, MD;  Location: [place];  Service: Vascular;  Laterality: Right;  . BIOPSY  08/12/2018   Procedure: BIOPSY;  Surgeon: Ladene Artist, MD;  Location: [facility];  Service: Endoscopy;;  . COLONOSCOPY WITH PROPOFOL N/A 08/12/2018   Procedure: COLONOSCOPY WITH PROPOFOL;  Surgeon: Ladene Artist, MD;  Location: [facility];  Service: Endoscopy;  Laterality: N/A;  . ESOPHAGOGASTRODUODENOSCOPY (EGD) WITH PROPOFOL N/A 08/12/2018   Procedure: ESOPHAGOGASTRODUODENOSCOPY (EGD) WITH PROPOFOL;  Surgeon: Ladene Artist, MD;  Location: [facility];  Service: Endoscopy;  Laterality: N/A;  . kidney biopsies     to determine lupus nephritis    FAMILY HISTORY: Family History  Problem Relation Age of Onset  . Lupus Mother   . Cancer Maternal Grandmother        unknown  . Prostate cancer Paternal Grandfather   . Diabetes Brother   . Diabetes Unknown        mat cousin    SOCIAL HISTORY:  reports that she has never smoked. She has never used smokeless tobacco. She reports that she does not drink alcohol or use drugs.  PERFORMANCE STATUS: The patient's performance status is 1 -  Symptomatic but completely ambulatory  PHYSICAL EXAM: Most Recent Vital Signs: Blood pressure 106/61, pulse 86, temperature 98 F (36.7 C), temperature source Oral, resp. rate 18, height '5\' 7"'$  (1.702 m), weight 181 lb 12.8 oz (82.5 kg), SpO2 97 %. BP 106/61 (BP Location: Left Arm, Patient Position: Sitting)   Pulse 86   Temp 98 F (36.7 C) (Oral)   Resp 18   Ht '5\' 7"'$  (1.702 m)   Wt 181 lb 12.8 oz (82.5 kg)   SpO2 97%   BMI 28.47 kg/m   General Appearance:    Alert, cooperative, no distress, appears stated age  Head:    Normocephalic, without obvious abnormality, atraumatic  Eyes:    PERRL, conjunctiva/corneas clear, EOM's intact, fundi    benign, both eyes  Throat:   Lips, mucosa, and tongue normal; teeth and gums normal  Neck:   Supple, symmetrical, trachea midline, no adenopathy;    thyroid:  no enlargement/tenderness/nodules; no carotid   bruit or JVD  Back:     Symmetric, no curvature, ROM normal, no CVA tenderness  Lungs:     Clear to auscultation bilaterally, respirations unlabored  Chest Wall:    No tenderness or deformity   Heart:    Regular rate and rhythm, S1 and S2 normal, no murmur, rub   or gallop     Abdomen:     Soft, non-tender, bowel sounds active all four quadrants,    no masses, no organomegaly        Extremities:   Extremities normal, atraumatic, no cyanosis or edema  Pulses:   2+ and symmetric all extremities  Skin:   Skin color, texture, turgor normal, no rashes or lesions  Lymph nodes:   Cervical, supraclavicular, and axillary nodes normal  Neurologic:   CNII-XII intact, normal strength, sensation and reflexes    throughout    LABORATORY DATA:  Results for orders placed or performed in visit on 09/25/18 (from the past 48 hour(s))  CBC with Differential (Cancer Center Only)     Status: Abnormal   Collection Time: 09/25/18  1:16 PM  Result Value Ref Range   WBC Count 2.8 (L) 4.0 - 10.5 K/uL   RBC 2.88 (L) 3.87 - 5.11 MIL/uL   Hemoglobin 8.4  (L) 12.0 - 15.0 g/dL   HCT 26.8 (L) 36.0 - 46.0 %   MCV 93.1 80.0 - 100.0 fL   MCH 29.2 26.0 - 34.0 pg   MCHC 31.3 30.0 - 36.0 g/dL   RDW 13.5 11.5 - 15.5 %   Platelet Count 166 150 - 400 K/uL   nRBC 0.0 0.0 - 0.2 %   Neutrophils Relative % 64 %   Neutro Abs 1.8 1.7 - 7.7 K/uL   Lymphocytes Relative 22 %   Lymphs Abs 0.6 (L) 0.7 - 4.0 K/uL   Monocytes Relative 10 %   Monocytes Absolute 0.3 0.1 - 1.0 K/uL   Eosinophils Relative 4 %   Eosinophils Absolute 0.1 0.0 - 0.5 K/uL   Basophils Relative 0 %   Basophils Absolute 0.0 0.0 - 0.1 K/uL   Immature Granulocytes 0 %   Abs Immature Granulocytes 0.01 0.00 - 0.07 K/uL    Comment: Performed at [facility] at [facility], [address] [place], [place] [address]  Reticulocytes     Status: Abnormal   Collection Time: 09/25/18  1:16 PM  Result Value Ref Range   Retic Ct Pct 1.5 0.4 - 3.1 %   RBC. 2.88 (L) 3.87 - 5.11 MIL/uL   Retic Count, Absolute 41.8 19.0 - 186.0 K/uL   Immature Retic Fract 3.8 2.3 - 15.9 %    Comment: Performed at [facility] at [facility], [address], [place], [place] [address]  [facility])     Status: None   Collection Time: 09/25/18  1:16 PM  Result Value Ref Range   Smear Review SMEAR STAINED AND AVAILABLE FOR REVIEW     Comment: Performed at [facility] at [facility], [address] [place], [place] [address]      RADIOGRAPHY: No results found.     PATHOLOGY: None  ASSESSMENT/PLAN: [name] [name] is a very pleasant 37 yo African Amercan female  with lupus nephritis on hemodialysis M,W,F. She has developed anemia that has required blood and IV iron in the past. She did not respond to the ESA.  Hgb is 8.4 with an MCV of 93.  We will see what her studies show and then determine what treatment is appropriate.  Once we have her results we can schedule her follow-up.   All questions were answered and she is in  agreement with the plan. She will contact our office with any questions or concerns. We can certainly see her sooner if need be.  She was discussed with and also seen by Dr. Marin Olp and he is in agreement with the aforementioned.   Laverna Peace    Addendum: I saw and examined the patient with Judson Roch.  I agree with her above assessment.  I did look at her blood under the microscope.  She had normochromic and normocytic red blood cells.  I saw no nucleated red blood cells.  I saw no teardrop cells.  She had no schistocytes or spherocytes.  There is no rouleaux formation.  White blood cells appeared normal.  She had good maturity of her white blood cells.  I saw no hypersegmented polys.  Her platelets were adequate in number and size.  I would have to think by her low reticulocyte count, that she is just not making red blood cells.  I have to believe that she is just not getting enough erythropoietin.  It is possible that she could be iron deficient.  We are checking this.  She did have an upper and lower endoscopy about 3 weeks ago.  She had some diverticulosis.  She had some small internal hemorrhoids.  There is no gastritis.  There is no bleeding.  I really think that we should be able to get her blood count better.  I feel bad that she is only 37 years old.  Hopefully, she will be able to get a kidney transplant.  I do not see that she needs a bone marrow biopsy at this time.  Given that she has a lupus, she could always have hemolytic anemia.  However, I would think this would be unlikely given that she has a low reticulocyte count.  We spent about 45 minutes with her.  All the time spent face-to-face.  We answered her questions and were coordinating further care for her.  Lattie Haw, MD

## 2018-09-26 ENCOUNTER — Telehealth: Payer: Self-pay | Admitting: Family

## 2018-09-26 ENCOUNTER — Ambulatory Visit: Payer: 59 | Admitting: Family

## 2018-09-26 ENCOUNTER — Encounter: Payer: Self-pay | Admitting: Gastroenterology

## 2018-09-26 ENCOUNTER — Inpatient Hospital Stay: Payer: 59

## 2018-09-26 ENCOUNTER — Ambulatory Visit (INDEPENDENT_AMBULATORY_CARE_PROVIDER_SITE_OTHER): Payer: Medicare Other | Admitting: Gastroenterology

## 2018-09-26 VITALS — BP 100/64 | HR 73 | Ht 67.0 in | Wt 187.0 lb

## 2018-09-26 DIAGNOSIS — R195 Other fecal abnormalities: Secondary | ICD-10-CM

## 2018-09-26 DIAGNOSIS — N186 End stage renal disease: Secondary | ICD-10-CM

## 2018-09-26 DIAGNOSIS — D631 Anemia in chronic kidney disease: Secondary | ICD-10-CM | POA: Diagnosis not present

## 2018-09-26 DIAGNOSIS — Z992 Dependence on renal dialysis: Secondary | ICD-10-CM

## 2018-09-26 LAB — IRON AND TIBC
Iron: 109 ug/dL (ref 41–142)
Saturation Ratios: 53 % (ref 21–57)
TIBC: 205 ug/dL — ABNORMAL LOW (ref 236–444)
UIBC: 97 ug/dL — ABNORMAL LOW (ref 120–384)

## 2018-09-26 LAB — FERRITIN: Ferritin: 1602 ng/mL — ABNORMAL HIGH (ref 11–307)

## 2018-09-26 NOTE — Patient Instructions (Addendum)
   [facility] CAPSULE ENDOSCOPY PATIENT INSTRUCTION SHEET  [name] [name] 07-14-1981 [id number]   1. 10/01/18 Seven (7) days prior to capsule endoscopy stop taking iron supplements and carafate.  2. 10/06/18 Two (2) days prior to capsule endoscopy stop taking aspirin or any arthritis drugs.  3. 10/07/18 Day before capsule endoscopy purchase a 238 gram bottle of Miralax from the laxative section of your drug store, and a 32 oz. bottle of Gatorade (no red).    4. 10/07/18 One (1) day prior to capsule endoscopy: a) Stop smoking. b) Eat a regular diet until 12:00 Noon. c) After 12:00 Noon take only the following: Black coffee  Jell-O (no fruit or red Jell-o) Water   Bouillon (chicken or beef) 7-Up   Cranberry Juice Tea   Kool-Aid Popsicle (not red) Sprite   Coke Ginger Ale  Pepsi Mountain Dew Gatorade d) At 6:00 pm the evening before your appointment, drink 7 capfuls (105 grams) of Miralax with 32 oz. Gatorade. Drink 8 oz every 15 minutes until gone. e) Nothing to eat or drink after midnight except medications with a sip of water.  5. 10/08/18 Day of capsule endoscopy:            Do not have anything to eat or drink after midnight No medications for 2 hours prior to your test.  6. Please arrive at [facility]  3rd floor patient registration area by 8:00 am on: 10/08/18.   For any questions: Call [place] at [phone] and ask to speak with one of the capsule endoscopy nurses.      What you should know.   You have been scheduled for a Capsule Endoscopy with Capsocam.  You will swallow a vitamin size pill that contains cameras that will take pictures of your small intestine (bowel).  Your small bowel connects to your stomach on one end and the large intestine or bowel on the other. The capsule moves through the gastrointestinal tract taking pictures along the way. The images are stored on the capsule.  1-5 days after the capsule ingestion you will retrieve the  capsule and mail it in a prepaid envelope to [employer] to download the images.  Your physician will be provided a copy of the images to review.   Who should have a capsule endoscopy?  You may need a small bowel capsule endoscopy if you have symptoms, such as blood in your stool, chronic pain, diarrhea, or unexplained anemia. The small intestine is a hollow organ that cannot be easily visualized with a scope due to its length.  The capsule endoscopy allows your physician to directly visualize the intestine.  The pictures may show intestine growths, inflammation, or bleeding in the small intestine.   What are the risks with a capsule endoscopy?  The pictures may not be clear or give [place] a clear cause of your symptoms The capsule may become trapped in the esophagus or intestines.  You may need surgery or endoscopic procedures to remove the capsule. You may not have a MRI until the capsule endoscopy has been retrieved.   How do I retrieve the capsule?  You will be provided a kit with step-by-step instructions for capsule retrieval and mailing.  You should expect to retrieve the capsule in 1-5 days after ingestion.  This will depend on your individual bowel habits.

## 2018-09-26 NOTE — Telephone Encounter (Signed)
NO LOS 12/4

## 2018-09-26 NOTE — Progress Notes (Signed)
    History of Present Illness: This is a 37 year old female with anemia and heme positive stool.  Colonoscopy and EGD recently performed as below.  Duodenitis is a potential source of heme positive stool however likely not a cause of her persistent anemia.  She has no gastrointestinal complaints.  At her office visit in October she had mild heartburn symptoms that have abated on daily pantoprazole.  Records supplied from her dialysis center show a hemoglobin of 7.7 on November 22.  Iron studies and ferritin are not consistent with iron deficiency.  Stool was positive for occult blood on November 8 and November 11.  EGD 07/2018 - Normal esophagus. - A few gastric polyps. Biopsied.  (benign gastric fundic polyps) - Duodenitis.  Colonoscopy 07/2018 - Diverticulosis in the left colon. - Internal hemorrhoids. - The examination was otherwise normal on direct and retroflexion views.  Current Medications, Allergies, Past Medical History, Past Surgical History, Family History and Social History were reviewed in [employer] record.  Physical Exam: General: Well developed, well nourished, no acute distress Head: Normocephalic and atraumatic Eyes:  sclerae anicteric, EOMI Ears: Normal auditory acuity Mouth: No deformity or lesions Neurological: Alert oriented x 4, grossly nonfocal Psychological:  Alert and cooperative. Normal mood and affect   Assessment and Recommendations:  1. Hemoccult positive stool and anemia. R/O SB sources of Hemoccult positive stool such as AVMs.  Schedule VCE. The severity of her anemia appears disproportionate to occult gastrointestinal bleeding.  She has no symptoms of overt gastrointestinal bleeding.  Her anemia is likely due to end-stage renal disease and SLE. Consider hematology referral, defer decision to her nephrologist.    2. ESRD on HD.   3. SLE.  4.  Duodenitis.  Mild GERD.  Continue pantoprazole 40 mg daily.

## 2018-09-27 DIAGNOSIS — N2581 Secondary hyperparathyroidism of renal origin: Secondary | ICD-10-CM | POA: Diagnosis not present

## 2018-09-27 DIAGNOSIS — D649 Anemia, unspecified: Secondary | ICD-10-CM | POA: Diagnosis not present

## 2018-09-27 DIAGNOSIS — N186 End stage renal disease: Secondary | ICD-10-CM | POA: Diagnosis not present

## 2018-09-30 DIAGNOSIS — D649 Anemia, unspecified: Secondary | ICD-10-CM | POA: Diagnosis not present

## 2018-09-30 DIAGNOSIS — N186 End stage renal disease: Secondary | ICD-10-CM | POA: Diagnosis not present

## 2018-09-30 DIAGNOSIS — N2581 Secondary hyperparathyroidism of renal origin: Secondary | ICD-10-CM | POA: Diagnosis not present

## 2018-10-02 DIAGNOSIS — N186 End stage renal disease: Secondary | ICD-10-CM | POA: Diagnosis not present

## 2018-10-02 DIAGNOSIS — N2581 Secondary hyperparathyroidism of renal origin: Secondary | ICD-10-CM | POA: Diagnosis not present

## 2018-10-02 DIAGNOSIS — D649 Anemia, unspecified: Secondary | ICD-10-CM | POA: Diagnosis not present

## 2018-10-03 LAB — ERYTHROPOIETIN: Erythropoietin: 5.3 m[IU]/mL (ref 2.6–18.5)

## 2018-10-04 ENCOUNTER — Other Ambulatory Visit: Payer: Self-pay | Admitting: Gastroenterology

## 2018-10-04 DIAGNOSIS — N186 End stage renal disease: Secondary | ICD-10-CM | POA: Diagnosis not present

## 2018-10-04 DIAGNOSIS — D649 Anemia, unspecified: Secondary | ICD-10-CM | POA: Diagnosis not present

## 2018-10-04 DIAGNOSIS — N2581 Secondary hyperparathyroidism of renal origin: Secondary | ICD-10-CM | POA: Diagnosis not present

## 2018-10-07 DIAGNOSIS — N2581 Secondary hyperparathyroidism of renal origin: Secondary | ICD-10-CM | POA: Diagnosis not present

## 2018-10-07 DIAGNOSIS — D649 Anemia, unspecified: Secondary | ICD-10-CM | POA: Diagnosis not present

## 2018-10-07 DIAGNOSIS — N186 End stage renal disease: Secondary | ICD-10-CM | POA: Diagnosis not present

## 2018-10-08 ENCOUNTER — Encounter: Payer: Self-pay | Admitting: Gastroenterology

## 2018-10-08 ENCOUNTER — Telehealth: Payer: Self-pay | Admitting: Family

## 2018-10-08 ENCOUNTER — Ambulatory Visit (INDEPENDENT_AMBULATORY_CARE_PROVIDER_SITE_OTHER): Payer: Medicare Other | Admitting: Gastroenterology

## 2018-10-08 DIAGNOSIS — D509 Iron deficiency anemia, unspecified: Secondary | ICD-10-CM | POA: Diagnosis not present

## 2018-10-08 DIAGNOSIS — R195 Other fecal abnormalities: Secondary | ICD-10-CM

## 2018-10-08 DIAGNOSIS — K921 Melena: Secondary | ICD-10-CM

## 2018-10-08 DIAGNOSIS — D631 Anemia in chronic kidney disease: Secondary | ICD-10-CM

## 2018-10-08 DIAGNOSIS — Z992 Dependence on renal dialysis: Secondary | ICD-10-CM

## 2018-10-08 DIAGNOSIS — N186 End stage renal disease: Secondary | ICD-10-CM

## 2018-10-08 NOTE — Telephone Encounter (Signed)
No answer, left message with call back number to discuss lab results and medication change.

## 2018-10-08 NOTE — Patient Instructions (Signed)

## 2018-10-08 NOTE — Progress Notes (Signed)
Patient arrived for capsule endoscopy; Patient tolerated procedure without complications; Patient verbalized understanding of written and verbal instructions;  CAPSULE= [id number] EXPIRES=11-16-2019

## 2018-10-09 ENCOUNTER — Telehealth: Payer: Self-pay | Admitting: Family

## 2018-10-09 ENCOUNTER — Other Ambulatory Visit: Payer: Self-pay | Admitting: Family

## 2018-10-09 DIAGNOSIS — N186 End stage renal disease: Secondary | ICD-10-CM | POA: Diagnosis not present

## 2018-10-09 DIAGNOSIS — N2581 Secondary hyperparathyroidism of renal origin: Secondary | ICD-10-CM | POA: Diagnosis not present

## 2018-10-09 DIAGNOSIS — D649 Anemia, unspecified: Secondary | ICD-10-CM | POA: Diagnosis not present

## 2018-10-09 NOTE — Telephone Encounter (Signed)
I called [facility] as well as her dialysis center yesterday to try and speak with Dr. Lorrene Reid but was unable to reach her as she had already gone home. I tried again today and was able to leave a message with call back number to discuss her changes from Mircera to Aranesp.

## 2018-10-09 NOTE — Progress Notes (Signed)
I was able to speak with Dr. Jamal Maes and she is going to change the patient from Mircera to Aranesp 200 mcg weekly. The patient also underwent her capsule endoscopy yesterday and results are pending. We will plan to see her back in another 6 weeks.

## 2018-10-10 ENCOUNTER — Telehealth: Payer: Self-pay | Admitting: Family

## 2018-10-10 DIAGNOSIS — Z130 Encounter for screening for diseases of the blood and blood-forming organs and certain disorders involving the immune mechanism: Secondary | ICD-10-CM | POA: Diagnosis not present

## 2018-10-10 DIAGNOSIS — A6000 Herpesviral infection of urogenital system, unspecified: Secondary | ICD-10-CM | POA: Diagnosis not present

## 2018-10-10 DIAGNOSIS — Z202 Contact with and (suspected) exposure to infections with a predominantly sexual mode of transmission: Secondary | ICD-10-CM | POA: Diagnosis not present

## 2018-10-10 DIAGNOSIS — Z992 Dependence on renal dialysis: Secondary | ICD-10-CM | POA: Diagnosis not present

## 2018-10-10 DIAGNOSIS — D638 Anemia in other chronic diseases classified elsewhere: Secondary | ICD-10-CM | POA: Diagnosis not present

## 2018-10-10 DIAGNOSIS — Z01419 Encounter for gynecological examination (general) (routine) without abnormal findings: Secondary | ICD-10-CM | POA: Diagnosis not present

## 2018-10-10 DIAGNOSIS — Z1389 Encounter for screening for other disorder: Secondary | ICD-10-CM | POA: Diagnosis not present

## 2018-10-10 DIAGNOSIS — Z113 Encounter for screening for infections with a predominantly sexual mode of transmission: Secondary | ICD-10-CM | POA: Diagnosis not present

## 2018-10-10 DIAGNOSIS — L930 Discoid lupus erythematosus: Secondary | ICD-10-CM | POA: Diagnosis not present

## 2018-10-10 DIAGNOSIS — I509 Heart failure, unspecified: Secondary | ICD-10-CM | POA: Diagnosis not present

## 2018-10-10 DIAGNOSIS — I272 Pulmonary hypertension, unspecified: Secondary | ICD-10-CM | POA: Diagnosis not present

## 2018-10-10 DIAGNOSIS — Z6829 Body mass index (BMI) 29.0-29.9, adult: Secondary | ICD-10-CM | POA: Diagnosis not present

## 2018-10-10 NOTE — Telephone Encounter (Signed)
Spoke with pt to confirm lab/ov appt 11/21/18 at 1030 am

## 2018-10-11 DIAGNOSIS — N2581 Secondary hyperparathyroidism of renal origin: Secondary | ICD-10-CM | POA: Diagnosis not present

## 2018-10-11 DIAGNOSIS — D649 Anemia, unspecified: Secondary | ICD-10-CM | POA: Diagnosis not present

## 2018-10-11 DIAGNOSIS — N186 End stage renal disease: Secondary | ICD-10-CM | POA: Diagnosis not present

## 2018-10-11 DIAGNOSIS — N852 Hypertrophy of uterus: Secondary | ICD-10-CM | POA: Diagnosis not present

## 2018-10-13 DIAGNOSIS — N2581 Secondary hyperparathyroidism of renal origin: Secondary | ICD-10-CM | POA: Diagnosis not present

## 2018-10-13 DIAGNOSIS — D649 Anemia, unspecified: Secondary | ICD-10-CM | POA: Diagnosis not present

## 2018-10-13 DIAGNOSIS — N186 End stage renal disease: Secondary | ICD-10-CM | POA: Diagnosis not present

## 2018-10-15 ENCOUNTER — Encounter: Payer: Self-pay | Admitting: Family Medicine

## 2018-10-15 DIAGNOSIS — N2581 Secondary hyperparathyroidism of renal origin: Secondary | ICD-10-CM | POA: Diagnosis not present

## 2018-10-15 DIAGNOSIS — D649 Anemia, unspecified: Secondary | ICD-10-CM | POA: Diagnosis not present

## 2018-10-15 DIAGNOSIS — N186 End stage renal disease: Secondary | ICD-10-CM | POA: Diagnosis not present

## 2018-10-18 DIAGNOSIS — N2581 Secondary hyperparathyroidism of renal origin: Secondary | ICD-10-CM | POA: Diagnosis not present

## 2018-10-18 DIAGNOSIS — D649 Anemia, unspecified: Secondary | ICD-10-CM | POA: Diagnosis not present

## 2018-10-18 DIAGNOSIS — N186 End stage renal disease: Secondary | ICD-10-CM | POA: Diagnosis not present

## 2018-10-20 DIAGNOSIS — D649 Anemia, unspecified: Secondary | ICD-10-CM | POA: Diagnosis not present

## 2018-10-20 DIAGNOSIS — N2581 Secondary hyperparathyroidism of renal origin: Secondary | ICD-10-CM | POA: Diagnosis not present

## 2018-10-20 DIAGNOSIS — N186 End stage renal disease: Secondary | ICD-10-CM | POA: Diagnosis not present

## 2018-10-21 ENCOUNTER — Other Ambulatory Visit: Payer: Self-pay | Admitting: Family Medicine

## 2018-10-21 DIAGNOSIS — D649 Anemia, unspecified: Secondary | ICD-10-CM | POA: Diagnosis not present

## 2018-10-21 DIAGNOSIS — N2581 Secondary hyperparathyroidism of renal origin: Secondary | ICD-10-CM | POA: Diagnosis not present

## 2018-10-21 DIAGNOSIS — N186 End stage renal disease: Secondary | ICD-10-CM | POA: Diagnosis not present

## 2018-10-21 NOTE — Telephone Encounter (Signed)
Copied from [place] [id number]. Topic: Quick Communication - Rx Refill/Question >> Oct 21, 2018  3:17 PM Wynetta Emery, [place] C wrote: Medication: lidocaine-prilocaine (EMLA) cream and also RENVELA 800 MG tablet   Has the patient contacted their pharmacy? Yes   (Agent: If no, request that the patient contact the pharmacy for the refill.) (Agent: If yes, when and what did the pharmacy advise?)  Preferred Pharmacy (with phone number or street name): [id number] - [place], [place]  Agent: Please be advised that RX refills may take up to 3 business days. We ask that you follow-up with your pharmacy.

## 2018-10-22 NOTE — Telephone Encounter (Signed)
See note

## 2018-10-22 NOTE — Telephone Encounter (Signed)
Requested medication (s) are due for refill today: Yes  Requested medication (s) are on the active medication list: Yes  Last refill:  Lidocaine-Prilocaine cream 08/07/18; Renvela 01/2016  Future visit scheduled: No  Notes to clinic:  Unable to refill per protocol, last refilled by historical provider, expired Rx     Requested Prescriptions  Pending Prescriptions Disp Refills   lidocaine-prilocaine (EMLA) cream 30 g 0    Sig: Apply 1 application topically daily as needed.     Off-Protocol Failed - 10/21/2018  7:00 PM      Failed - Medication not assigned to a protocol, review manually.      Passed - Valid encounter within last 12 months    Recent Outpatient Visits          1 month ago Anemia, unspecified type   [place] [facility], Brayton Mars, MD   2 months ago [place] [facility], Ebony Hail, MD   3 months ago Community acquired pneumonia of right lower lobe of lung [facility])   [place] [facility], Brayton Mars, MD   3 months ago Community acquired pneumonia of right lower lobe of lung [facility])   [facility], Brayton Mars, MD   1 year ago Atypical chest pain   [facility], [place]            RENVELA 800 MG tablet  11    Sig: Take 1-4 tablets (800-3,200 mg total) by mouth as directed. Take 3-4 tablets by mouth daily with each meals & take 1-2 tablets by mouth daily with each snack.     Endocrinology:  Phosphate Binders Failed - 10/21/2018  7:00 PM      Failed - Ca in normal range and within 360 days    Calcium  Date Value Ref Range Status  09/25/2018 8.5 (L) 8.9 - 10.3 mg/dL Final   Calcium, Total (PTH)  Date Value Ref Range Status  09/01/2015 8.4 (L) 8.7 - 10.2 mg/dL Final         Failed - Phosphate in normal range and within 360 days    Phosphorus  Date Value Ref Range Status  03/20/2016 14.3 (H) 2.5 - 4.6 mg/dL Final    Comment:    RESULTS CONFIRMED BY MANUAL DILUTION         Failed - Cr in normal range and within 360 days    Creatinine  Date Value Ref Range Status  09/25/2018 5.19 (HH) 0.44 - 1.00 mg/dL Final    Comment:    repeated CRITICAL RESULT CALLED TO, READ BACK BY AND VERIFIED WITH: JAMIE T. RN  2:15 PM  09/25/18  SDD     Creat  Date Value Ref Range Status  02/01/2013 1.39 (H) 0.50 - 1.10 mg/dL Final         Failed - PTH in normal range and within 360 days    PTH Interp  Date Value Ref Range Status  09/01/2015 Comment  Final    Comment:    (NOTE) Interpretation                 Intact PTH    Calcium                                (pg/mL)      (mg/dL) Normal  15 - 65     8.6 - 10.2 Primary Hyperparathyroidism         >65          >10.2 Secondary Hyperparathyroidism       >65          <10.2 Non-Parathyroid Hypercalcemia       <65          >10.2 Hypoparathyroidism                  <15          < 8.6 Non-Parathyroid Hypocalcemia    15 - 65          < 8.6 Performed At: [facility] [place], [place] [id number] Lindon Romp MD [id number]    PTH  Date Value Ref Range Status  09/01/2015 194 (H) 15 - 65 pg/mL Final         Passed - Albumin in normal range and within 360 days    Albumin  Date Value Ref Range Status  09/25/2018 4.2 3.5 - 5.0 g/dL Final         Passed - Valid encounter within last 12 months    Recent Outpatient Visits          1 month ago Anemia, unspecified type   [place] [facility], Brayton Mars, MD   2 months ago [place] [facility]fe, Ebony Hail, MD   3 months ago Community acquired pneumonia of right lower lobe of lung [facility])   [place] [facility], Brayton Mars, MD   3 months ago Community acquired pneumonia of right lower lobe of lung [facility])   [place] [facility], Brayton Mars, MD   1 year ago Atypical chest pain   [place] [facility], Brayton Mars, MD

## 2018-10-22 NOTE — Telephone Encounter (Signed)
May fill lidocaine - renvela I would encourage her nephrologist/kidney doctor to prescribe

## 2018-10-23 DIAGNOSIS — N186 End stage renal disease: Secondary | ICD-10-CM | POA: Diagnosis not present

## 2018-10-23 DIAGNOSIS — I158 Other secondary hypertension: Secondary | ICD-10-CM | POA: Diagnosis not present

## 2018-10-23 DIAGNOSIS — Z992 Dependence on renal dialysis: Secondary | ICD-10-CM | POA: Diagnosis not present

## 2018-10-24 ENCOUNTER — Encounter: Payer: Self-pay | Admitting: Gastroenterology

## 2018-10-24 MED ORDER — LIDOCAINE-PRILOCAINE 2.5-2.5 % EX CREA: 1.0000 "application " | TOPICAL_CREAM | Freq: Every day | CUTANEOUS | 0 refills | Status: AC | PRN

## 2018-10-25 DIAGNOSIS — D631 Anemia in chronic kidney disease: Secondary | ICD-10-CM | POA: Diagnosis not present

## 2018-10-25 DIAGNOSIS — N2581 Secondary hyperparathyroidism of renal origin: Secondary | ICD-10-CM | POA: Diagnosis not present

## 2018-10-25 DIAGNOSIS — D649 Anemia, unspecified: Secondary | ICD-10-CM | POA: Diagnosis not present

## 2018-10-25 DIAGNOSIS — N186 End stage renal disease: Secondary | ICD-10-CM | POA: Diagnosis not present

## 2018-10-26 DIAGNOSIS — N186 End stage renal disease: Secondary | ICD-10-CM | POA: Diagnosis not present

## 2018-10-26 DIAGNOSIS — N2581 Secondary hyperparathyroidism of renal origin: Secondary | ICD-10-CM | POA: Diagnosis not present

## 2018-10-26 DIAGNOSIS — D631 Anemia in chronic kidney disease: Secondary | ICD-10-CM | POA: Diagnosis not present

## 2018-10-26 DIAGNOSIS — D649 Anemia, unspecified: Secondary | ICD-10-CM | POA: Diagnosis not present

## 2018-10-28 DIAGNOSIS — N186 End stage renal disease: Secondary | ICD-10-CM | POA: Diagnosis not present

## 2018-10-28 DIAGNOSIS — D649 Anemia, unspecified: Secondary | ICD-10-CM | POA: Diagnosis not present

## 2018-10-28 DIAGNOSIS — N2581 Secondary hyperparathyroidism of renal origin: Secondary | ICD-10-CM | POA: Diagnosis not present

## 2018-10-28 DIAGNOSIS — D631 Anemia in chronic kidney disease: Secondary | ICD-10-CM | POA: Diagnosis not present

## 2018-10-29 DIAGNOSIS — I272 Pulmonary hypertension, unspecified: Secondary | ICD-10-CM | POA: Diagnosis not present

## 2018-10-30 DIAGNOSIS — D631 Anemia in chronic kidney disease: Secondary | ICD-10-CM | POA: Diagnosis not present

## 2018-10-30 DIAGNOSIS — D649 Anemia, unspecified: Secondary | ICD-10-CM | POA: Diagnosis not present

## 2018-10-30 DIAGNOSIS — N186 End stage renal disease: Secondary | ICD-10-CM | POA: Diagnosis not present

## 2018-10-30 DIAGNOSIS — N2581 Secondary hyperparathyroidism of renal origin: Secondary | ICD-10-CM | POA: Diagnosis not present

## 2018-11-01 DIAGNOSIS — N2581 Secondary hyperparathyroidism of renal origin: Secondary | ICD-10-CM | POA: Diagnosis not present

## 2018-11-01 DIAGNOSIS — D631 Anemia in chronic kidney disease: Secondary | ICD-10-CM | POA: Diagnosis not present

## 2018-11-01 DIAGNOSIS — N186 End stage renal disease: Secondary | ICD-10-CM | POA: Diagnosis not present

## 2018-11-01 DIAGNOSIS — D649 Anemia, unspecified: Secondary | ICD-10-CM | POA: Diagnosis not present

## 2018-11-04 DIAGNOSIS — D649 Anemia, unspecified: Secondary | ICD-10-CM | POA: Diagnosis not present

## 2018-11-04 DIAGNOSIS — D631 Anemia in chronic kidney disease: Secondary | ICD-10-CM | POA: Diagnosis not present

## 2018-11-04 DIAGNOSIS — N2581 Secondary hyperparathyroidism of renal origin: Secondary | ICD-10-CM | POA: Diagnosis not present

## 2018-11-04 DIAGNOSIS — N186 End stage renal disease: Secondary | ICD-10-CM | POA: Diagnosis not present

## 2018-11-06 DIAGNOSIS — D649 Anemia, unspecified: Secondary | ICD-10-CM | POA: Diagnosis not present

## 2018-11-06 DIAGNOSIS — N186 End stage renal disease: Secondary | ICD-10-CM | POA: Diagnosis not present

## 2018-11-06 DIAGNOSIS — D631 Anemia in chronic kidney disease: Secondary | ICD-10-CM | POA: Diagnosis not present

## 2018-11-06 DIAGNOSIS — N2581 Secondary hyperparathyroidism of renal origin: Secondary | ICD-10-CM | POA: Diagnosis not present

## 2018-11-08 DIAGNOSIS — D631 Anemia in chronic kidney disease: Secondary | ICD-10-CM | POA: Diagnosis not present

## 2018-11-08 DIAGNOSIS — N2581 Secondary hyperparathyroidism of renal origin: Secondary | ICD-10-CM | POA: Diagnosis not present

## 2018-11-08 DIAGNOSIS — N186 End stage renal disease: Secondary | ICD-10-CM | POA: Diagnosis not present

## 2018-11-08 DIAGNOSIS — D649 Anemia, unspecified: Secondary | ICD-10-CM | POA: Diagnosis not present

## 2018-11-11 DIAGNOSIS — N2581 Secondary hyperparathyroidism of renal origin: Secondary | ICD-10-CM | POA: Diagnosis not present

## 2018-11-11 DIAGNOSIS — D631 Anemia in chronic kidney disease: Secondary | ICD-10-CM | POA: Diagnosis not present

## 2018-11-11 DIAGNOSIS — D649 Anemia, unspecified: Secondary | ICD-10-CM | POA: Diagnosis not present

## 2018-11-11 DIAGNOSIS — N186 End stage renal disease: Secondary | ICD-10-CM | POA: Diagnosis not present

## 2018-11-12 DIAGNOSIS — I272 Pulmonary hypertension, unspecified: Secondary | ICD-10-CM | POA: Diagnosis not present

## 2018-11-13 DIAGNOSIS — D631 Anemia in chronic kidney disease: Secondary | ICD-10-CM | POA: Diagnosis not present

## 2018-11-13 DIAGNOSIS — N186 End stage renal disease: Secondary | ICD-10-CM | POA: Diagnosis not present

## 2018-11-13 DIAGNOSIS — D649 Anemia, unspecified: Secondary | ICD-10-CM | POA: Diagnosis not present

## 2018-11-13 DIAGNOSIS — N2581 Secondary hyperparathyroidism of renal origin: Secondary | ICD-10-CM | POA: Diagnosis not present

## 2018-11-15 DIAGNOSIS — N2581 Secondary hyperparathyroidism of renal origin: Secondary | ICD-10-CM | POA: Diagnosis not present

## 2018-11-15 DIAGNOSIS — N186 End stage renal disease: Secondary | ICD-10-CM | POA: Diagnosis not present

## 2018-11-15 DIAGNOSIS — D631 Anemia in chronic kidney disease: Secondary | ICD-10-CM | POA: Diagnosis not present

## 2018-11-15 DIAGNOSIS — D649 Anemia, unspecified: Secondary | ICD-10-CM | POA: Diagnosis not present

## 2018-11-18 DIAGNOSIS — D649 Anemia, unspecified: Secondary | ICD-10-CM | POA: Diagnosis not present

## 2018-11-18 DIAGNOSIS — N2581 Secondary hyperparathyroidism of renal origin: Secondary | ICD-10-CM | POA: Diagnosis not present

## 2018-11-18 DIAGNOSIS — D631 Anemia in chronic kidney disease: Secondary | ICD-10-CM | POA: Diagnosis not present

## 2018-11-18 DIAGNOSIS — N186 End stage renal disease: Secondary | ICD-10-CM | POA: Diagnosis not present

## 2018-11-20 ENCOUNTER — Other Ambulatory Visit: Payer: Self-pay | Admitting: Family

## 2018-11-20 DIAGNOSIS — D649 Anemia, unspecified: Secondary | ICD-10-CM

## 2018-11-20 DIAGNOSIS — N184 Chronic kidney disease, stage 4 (severe): Secondary | ICD-10-CM

## 2018-11-20 DIAGNOSIS — D631 Anemia in chronic kidney disease: Secondary | ICD-10-CM | POA: Diagnosis not present

## 2018-11-20 DIAGNOSIS — Z992 Dependence on renal dialysis: Principal | ICD-10-CM

## 2018-11-20 DIAGNOSIS — N186 End stage renal disease: Secondary | ICD-10-CM | POA: Diagnosis not present

## 2018-11-20 DIAGNOSIS — N2581 Secondary hyperparathyroidism of renal origin: Secondary | ICD-10-CM | POA: Diagnosis not present

## 2018-11-21 ENCOUNTER — Inpatient Hospital Stay: Payer: Medicare Other

## 2018-11-21 ENCOUNTER — Telehealth: Payer: Self-pay | Admitting: *Deleted

## 2018-11-21 ENCOUNTER — Encounter: Payer: Self-pay | Admitting: Family

## 2018-11-21 ENCOUNTER — Inpatient Hospital Stay: Payer: Medicare Other | Attending: Hematology & Oncology | Admitting: Family

## 2018-11-21 VITALS — BP 119/81 | HR 84 | Temp 98.4°F | Resp 18 | Ht 67.0 in | Wt 190.1 lb

## 2018-11-21 DIAGNOSIS — Z992 Dependence on renal dialysis: Secondary | ICD-10-CM | POA: Insufficient documentation

## 2018-11-21 DIAGNOSIS — D631 Anemia in chronic kidney disease: Secondary | ICD-10-CM

## 2018-11-21 DIAGNOSIS — N184 Chronic kidney disease, stage 4 (severe): Secondary | ICD-10-CM

## 2018-11-21 DIAGNOSIS — D649 Anemia, unspecified: Secondary | ICD-10-CM

## 2018-11-21 DIAGNOSIS — R0602 Shortness of breath: Secondary | ICD-10-CM | POA: Diagnosis not present

## 2018-11-21 DIAGNOSIS — R42 Dizziness and giddiness: Secondary | ICD-10-CM | POA: Insufficient documentation

## 2018-11-21 DIAGNOSIS — D538 Other specified nutritional anemias: Secondary | ICD-10-CM | POA: Diagnosis not present

## 2018-11-21 DIAGNOSIS — M3214 Glomerular disease in systemic lupus erythematosus: Secondary | ICD-10-CM

## 2018-11-21 DIAGNOSIS — N186 End stage renal disease: Secondary | ICD-10-CM

## 2018-11-21 LAB — CBC WITH DIFFERENTIAL (CANCER CENTER ONLY)
Abs Immature Granulocytes: 0.02 10*3/uL (ref 0.00–0.07)
Basophils Absolute: 0 10*3/uL (ref 0.0–0.1)
Basophils Relative: 0 %
Eosinophils Absolute: 0 10*3/uL (ref 0.0–0.5)
Eosinophils Relative: 0 %
HCT: 32.2 % — ABNORMAL LOW (ref 36.0–46.0)
HEMOGLOBIN: 10.2 g/dL — AB (ref 12.0–15.0)
Immature Granulocytes: 1 %
Lymphocytes Relative: 18 %
Lymphs Abs: 0.6 10*3/uL — ABNORMAL LOW (ref 0.7–4.0)
MCH: 30.1 pg (ref 26.0–34.0)
MCHC: 31.7 g/dL (ref 30.0–36.0)
MCV: 95 fL (ref 80.0–100.0)
MONOS PCT: 12 %
Monocytes Absolute: 0.4 10*3/uL (ref 0.1–1.0)
Neutro Abs: 2.1 10*3/uL (ref 1.7–7.7)
Neutrophils Relative %: 69 %
Platelet Count: 191 10*3/uL (ref 150–400)
RBC: 3.39 MIL/uL — ABNORMAL LOW (ref 3.87–5.11)
RDW: 14.7 % (ref 11.5–15.5)
WBC Count: 3.1 10*3/uL — ABNORMAL LOW (ref 4.0–10.5)
nRBC: 0 % (ref 0.0–0.2)

## 2018-11-21 LAB — CMP (CANCER CENTER ONLY)
ALT: 13 U/L (ref 0–44)
AST: 21 U/L (ref 15–41)
Albumin: 4.1 g/dL (ref 3.5–5.0)
Alkaline Phosphatase: 76 U/L (ref 38–126)
Anion gap: 9 (ref 5–15)
BUN: 46 mg/dL — ABNORMAL HIGH (ref 6–20)
CO2: 37 mmol/L — ABNORMAL HIGH (ref 22–32)
CREATININE: 9.59 mg/dL — AB (ref 0.44–1.00)
Calcium: 9.6 mg/dL (ref 8.9–10.3)
Chloride: 95 mmol/L — ABNORMAL LOW (ref 98–111)
GFR, Est AFR Am: 5 mL/min — ABNORMAL LOW (ref >60)
GFR, Estimated: 5 mL/min — ABNORMAL LOW (ref >60)
Glucose, Bld: 100 mg/dL — ABNORMAL HIGH (ref 70–99)
Potassium: 4.2 mmol/L (ref 3.5–5.1)
Sodium: 141 mmol/L (ref 135–145)
Total Bilirubin: 0.4 mg/dL (ref 0.3–1.2)
Total Protein: 7.9 g/dL (ref 6.5–8.1)

## 2018-11-21 LAB — RETICULOCYTES
IMMATURE RETIC FRACT: 16.8 % — AB (ref 2.3–15.9)
RBC.: 3.39 MIL/uL — AB (ref 3.87–5.11)
Retic Count, Absolute: 81.4 10*3/uL (ref 19.0–186.0)
Retic Ct Pct: 2.4 % (ref 0.4–3.1)

## 2018-11-21 LAB — IRON AND TIBC
Iron: 50 ug/dL (ref 41–142)
Saturation Ratios: 27 % (ref 21–57)
TIBC: 186 ug/dL — ABNORMAL LOW (ref 236–444)
UIBC: 136 ug/dL (ref 120–384)

## 2018-11-21 LAB — FERRITIN: Ferritin: 1128 ng/mL — ABNORMAL HIGH (ref 11–307)

## 2018-11-21 NOTE — Telephone Encounter (Signed)
Critical Value Creatinine 9.59 Laverna Peace NP notified. No orders at this time

## 2018-11-21 NOTE — Progress Notes (Signed)
Hematology and Oncology Follow Up Visit  [name] [name] [id number] [birth year] 38 y.o. 11/21/2018   Principle Diagnosis:  Erythropoietin deficiency anemia secondary to lupus nephritis   Current Therapy:   Aranesp now given with dialysis per Dr. Lorrene Reid   Interim History:  [name] [name] is here today for follow-up. She is doing well and due to insurance issues only started the Aranesp with dialysis last week. Her Hgb has already improved to 10.2, MCV 95.  She still has some mild fatigue at times.  She has occasional episodes of SOB with exertion and uses 2L [place] supplemental O2 at home as needed.  She sleeps with 2 L [place] supplemental O2 at night.  She will sometimes have some dizziness with dialysis.  She states that she does not make much urine anymore.  No fever, chills, n/v, cough, rash, chest pain, palpitations, abdominal pain or changes in bowel or bladder habits.  No swelling, tenderness, numbness or tingling in her extremities at this time. She will sometimes have some puffiness in her lower extremities between treatment.  No lymphadenopathy noted on exam.  She has maintained a good appetite and is staying hydrated on fluid restrictions. Her weight is stable.    ECOG Performance Status: 1 - Symptomatic but completely ambulatory  Medications:  Allergies as of 11/21/2018      Reactions   Lisinopril Swelling   SWELLING OF TOP LIP   Nsaids Other (See Comments)   MD TOLD PATIENT TO AVOID NSAIDS DUE TO RENAL INSUFFICIENY.      Medication List       Accurate as of November 21, 2018 11:10 AM. Always use your most recent med list.        amLODipine 10 MG tablet Commonly known as:  NORVASC Take 10 mg by mouth every evening.   b complex-vitamin c-folic acid 0.8 MG Tabs tablet Take 1 tablet by mouth daily.   carvedilol 25 MG tablet Commonly known as:  COREG Take 25 mg by mouth 2 (two) times daily.   cloNIDine 0.1 MG tablet Commonly known as:  CATAPRES Take 0.1 mg by  mouth 2 (two) times daily.   hydroxychloroquine 200 MG tablet Commonly known as:  PLAQUENIL TAKE 1 TABLET BY MOUTH TWICE A [name]   lidocaine-prilocaine cream Commonly known as:  EMLA Apply 1 application topically daily as needed.   losartan 100 MG tablet Commonly known as:  COZAAR Take 100 mg by mouth every evening.   ondansetron 4 MG disintegrating tablet Commonly known as:  ZOFRAN-ODT Take 1 tablet (4 mg total) by mouth every 8 (eight) hours as needed for nausea or vomiting.   OPSUMIT 10 MG tablet Generic drug:  macitentan Take 10 mg by mouth daily.   pantoprazole 40 MG tablet Commonly known as:  PROTONIX Take 1 tablet (40 mg total) by mouth daily.   predniSONE 10 MG tablet Commonly known as:  DELTASONE Take 10 mg by mouth at bedtime.   RENVELA 800 MG tablet Generic drug:  sevelamer carbonate Take 800-3,200 mg by mouth as directed. Take 3-4 tablets by mouth daily with each meals & take 1-2 tablets by mouth daily with each snack.   triamcinolone ointment 0.1 % Commonly known as:  KENALOG Apply 1 application topically daily as needed (FOR RASH/SKIN IRRITATION.).       Allergies:  Allergies  Allergen Reactions  . Lisinopril Swelling    SWELLING OF TOP LIP  . Nsaids Other (See Comments)    MD TOLD PATIENT TO AVOID NSAIDS  DUE TO RENAL INSUFFICIENY.    Past Medical History, Surgical history, Social history, and Family History were reviewed and updated.  Review of Systems: All other 10 point review of systems is negative.   Physical Exam:  vitals were not taken for this visit.   Wt Readings from Last 3 Encounters:  09/26/18 187 lb (84.8 kg)  09/25/18 181 lb 12.8 oz (82.5 kg)  09/13/18 184 lb 9.6 oz (83.7 kg)    Ocular: Sclerae unicteric, pupils equal, round and reactive to light Ear-nose-throat: Oropharynx clear, dentition fair Lymphatic: No cervical, supraclavicular or axillary adenopathy Lungs no rales or rhonchi, good excursion bilaterally Heart regular  rate and rhythm, no murmur appreciated Abd soft, nontender, positive bowel sounds, no liver or spleen tip palpated on exam, no fluid wave  MSK no focal spinal tenderness, no joint edema Neuro: non-focal, well-oriented, appropriate affect Breasts: Deferred   Lab Results  Component Value Date   WBC 3.1 (L) 11/21/2018   HGB 10.2 (L) 11/21/2018   HCT 32.2 (L) 11/21/2018   MCV 95.0 11/21/2018   PLT 191 11/21/2018   Lab Results  Component Value Date   FERRITIN 1,602 (H) 09/25/2018   IRON 109 09/25/2018   TIBC 205 (L) 09/25/2018   UIBC 97 (L) 09/25/2018   IRONPCTSAT 53 09/25/2018   Lab Results  Component Value Date   RETICCTPCT 2.4 11/21/2018   RBC 3.39 (L) 11/21/2018   RBC 3.39 (L) 11/21/2018   No results found for: KPAFRELGTCHN, LAMBDASER, KAPLAMBRATIO No results found for: IGGSERUM, IGA, IGMSERUM No results found for: Will Bonnet, GAMS, MSPIKE, SPEI   Chemistry      Component Value Date/Time   NA 141 11/21/2018 1028   K 4.2 11/21/2018 1028   CL 95 (L) 11/21/2018 1028   CO2 37 (H) 11/21/2018 1028   BUN 46 (H) 11/21/2018 1028   CREATININE 9.59 (HH) 11/21/2018 1028   CREATININE 1.39 (H) 02/01/2013 1400      Component Value Date/Time   CALCIUM 9.6 11/21/2018 1028   CALCIUM 8.4 (L) 09/01/2015 1402   ALKPHOS 76 11/21/2018 1028   AST 21 11/21/2018 1028   ALT 13 11/21/2018 1028   BILITOT 0.4 11/21/2018 1028       Impression and Plan: [name] [name] is a very pleasant 38 yo African American female with lupus nephritis on hemodialysis. She has erythropoietin deficiency anemia and has responded nicely to Aranesp with DR. Dunham during dialysis.  Hgb is up to 10.2.  I spoke with Dr. Marin Olp and at this point we can let her go from our office. She can certainly follow-up with [place] as needed for any future heme/onc issues.  She will contact our office with any questions or concerns.   Laverna Peace, NP 1/30/202011:10 AM

## 2018-11-22 DIAGNOSIS — D649 Anemia, unspecified: Secondary | ICD-10-CM | POA: Diagnosis not present

## 2018-11-22 DIAGNOSIS — N186 End stage renal disease: Secondary | ICD-10-CM | POA: Diagnosis not present

## 2018-11-22 DIAGNOSIS — D631 Anemia in chronic kidney disease: Secondary | ICD-10-CM | POA: Diagnosis not present

## 2018-11-22 DIAGNOSIS — N2581 Secondary hyperparathyroidism of renal origin: Secondary | ICD-10-CM | POA: Diagnosis not present

## 2018-11-22 LAB — ERYTHROPOIETIN: Erythropoietin: 476.3 m[IU]/mL — ABNORMAL HIGH (ref 2.6–18.5)

## 2018-11-23 DIAGNOSIS — N186 End stage renal disease: Secondary | ICD-10-CM | POA: Diagnosis not present

## 2018-11-23 DIAGNOSIS — Z992 Dependence on renal dialysis: Secondary | ICD-10-CM | POA: Diagnosis not present

## 2018-11-23 DIAGNOSIS — I158 Other secondary hypertension: Secondary | ICD-10-CM | POA: Diagnosis not present

## 2018-11-25 ENCOUNTER — Ambulatory Visit: Payer: Self-pay | Admitting: Family Medicine

## 2018-11-25 DIAGNOSIS — N186 End stage renal disease: Secondary | ICD-10-CM | POA: Diagnosis not present

## 2018-11-25 DIAGNOSIS — N2581 Secondary hyperparathyroidism of renal origin: Secondary | ICD-10-CM | POA: Diagnosis not present

## 2018-11-25 DIAGNOSIS — D631 Anemia in chronic kidney disease: Secondary | ICD-10-CM | POA: Diagnosis not present

## 2018-11-25 DIAGNOSIS — E611 Iron deficiency: Secondary | ICD-10-CM | POA: Diagnosis not present

## 2018-11-25 NOTE — Telephone Encounter (Signed)
[name] [name], I am not able to triage patients as I am not clinical however, this was already triaged also by Tiffany, RN with the [facility]. The disposition says "Per disposition, patient should be seen d/t history.  Patient advised she would receive a call back within the hour in regards to her appointment time.  Routing to practice per One note." You all are to contact the patient to see about working the patient in.

## 2018-11-25 NOTE — Telephone Encounter (Signed)
See note

## 2018-11-25 NOTE — Telephone Encounter (Signed)
Can you please triage this 

## 2018-11-25 NOTE — Telephone Encounter (Signed)
  Irregular heart beat per patient that started over the weekend.  No other symptoms. Patient states she has started Sensipar x 2 weeks ago.  Patient takes clonidine, norvasc, coreg but didn't take it today because of dialysis.  HR checked at home via pulse ox and it was 90.  Patient states she can "feel" the irregular heartbeat when she is just sitting still.    Per disposition, patient should be seen d/t history.  Patient advised she would receive a call back within the hour in regards to her appointment time.  Routing to practice per One note. Reason for Disposition . History of heart disease  (i.e., heart attack, bypass surgery, angina, angioplasty, CHF) (Exception: brief heart beat symptoms that went away and now feels well)  Answer Assessment - Initial Assessment Questions 1. DESCRIPTION: "Please describe your heart rate or heart beat that you are having" (e.g., fast/slow, regular/irregular, skipped or extra beats, "palpitations")     irregular 2. ONSET: "When did it start?" (Minutes, hours or days)      Some time over the weekend 3. DURATION: "How long does it last" (e.g., seconds, minutes, hours)  about every min or so, she will have a flutter 4. PATTERN "Does it come and go, or has it been constant since it started?"  "Does it get worse with exertion?"   "Are you feeling it now?" Gets worse when she moves around  6. HEART RATE: "Can you tell me your heart rate?" "How many beats in 15 seconds?"  (Note: not all patients can do this)       Patient has a pulse at home.  Current Heart rate is 90 7. RECURRENT SYMPTOM: "Have you ever had this before?" If so, ask: "When was the last time?" and "What happened that time?"     Had fluttering before  9. CARDIAC HISTORY: "Do you have any history of heart disease?" (e.g., heart attack, angina, bypass surgery, angioplasty, arrhythmia)  She has had CHF  10. OTHER SYMPTOMS: "Do you have any other symptoms?" (e.g., dizziness, chest pain, sweating,  difficulty breathing)    no 11. PREGNANCY: "Is there any chance you are pregnant?" "When was your last menstrual period?"       Currently on period  Protocols used: [place]

## 2018-11-26 ENCOUNTER — Telehealth: Payer: Self-pay | Admitting: Gastroenterology

## 2018-11-26 ENCOUNTER — Encounter: Payer: Self-pay | Admitting: Physician Assistant

## 2018-11-26 ENCOUNTER — Emergency Department (HOSPITAL_COMMUNITY)
Admission: EM | Admit: 2018-11-26 | Discharge: 2018-11-26 | Disposition: A | Discharge status: Home / Self Care | Payer: Medicare Other | Attending: Emergency Medicine | Admitting: Emergency Medicine

## 2018-11-26 ENCOUNTER — Ambulatory Visit (INDEPENDENT_AMBULATORY_CARE_PROVIDER_SITE_OTHER): Payer: Medicare Other | Admitting: Physician Assistant

## 2018-11-26 ENCOUNTER — Other Ambulatory Visit: Payer: Self-pay

## 2018-11-26 ENCOUNTER — Encounter (HOSPITAL_COMMUNITY): Payer: Self-pay | Admitting: Emergency Medicine

## 2018-11-26 ENCOUNTER — Emergency Department (HOSPITAL_COMMUNITY): Payer: Medicare Other

## 2018-11-26 VITALS — BP 100/70 | HR 75 | Temp 98.2°F | Ht 67.0 in | Wt 191.5 lb

## 2018-11-26 DIAGNOSIS — Z992 Dependence on renal dialysis: Secondary | ICD-10-CM | POA: Insufficient documentation

## 2018-11-26 DIAGNOSIS — Z79899 Other long term (current) drug therapy: Secondary | ICD-10-CM | POA: Diagnosis not present

## 2018-11-26 DIAGNOSIS — I120 Hypertensive chronic kidney disease with stage 5 chronic kidney disease or end stage renal disease: Secondary | ICD-10-CM | POA: Diagnosis not present

## 2018-11-26 DIAGNOSIS — N186 End stage renal disease: Secondary | ICD-10-CM

## 2018-11-26 DIAGNOSIS — I499 Cardiac arrhythmia, unspecified: Secondary | ICD-10-CM

## 2018-11-26 DIAGNOSIS — I132 Hypertensive heart and chronic kidney disease with heart failure and with stage 5 chronic kidney disease, or end stage renal disease: Secondary | ICD-10-CM | POA: Diagnosis not present

## 2018-11-26 DIAGNOSIS — I509 Heart failure, unspecified: Secondary | ICD-10-CM | POA: Diagnosis not present

## 2018-11-26 DIAGNOSIS — R002 Palpitations: Secondary | ICD-10-CM | POA: Diagnosis not present

## 2018-11-26 DIAGNOSIS — Z8673 Personal history of transient ischemic attack (TIA), and cerebral infarction without residual deficits: Secondary | ICD-10-CM | POA: Insufficient documentation

## 2018-11-26 DIAGNOSIS — R000 Tachycardia, unspecified: Secondary | ICD-10-CM | POA: Diagnosis not present

## 2018-11-26 LAB — BASIC METABOLIC PANEL
Anion gap: 10 (ref 5–15)
BUN: 46 mg/dL — ABNORMAL HIGH (ref 6–20)
CALCIUM: 7.7 mg/dL — AB (ref 8.9–10.3)
CO2: 32 mmol/L (ref 22–32)
CREATININE: 10.86 mg/dL — AB (ref 0.44–1.00)
Chloride: 95 mmol/L — ABNORMAL LOW (ref 98–111)
GFR calc Af Amer: 5 mL/min — ABNORMAL LOW (ref >60)
GFR calc non Af Amer: 4 mL/min — ABNORMAL LOW (ref >60)
Glucose, Bld: 91 mg/dL (ref 70–99)
Potassium: 3.5 mmol/L (ref 3.5–5.1)
SODIUM: 137 mmol/L (ref 135–145)

## 2018-11-26 LAB — CBC
HCT: 32.3 % — ABNORMAL LOW (ref 36.0–46.0)
Hemoglobin: 9.9 g/dL — ABNORMAL LOW (ref 12.0–15.0)
MCH: 30.2 pg (ref 26.0–34.0)
MCHC: 30.7 g/dL (ref 30.0–36.0)
MCV: 98.5 fL (ref 80.0–100.0)
Platelets: 155 10*3/uL (ref 150–400)
RBC: 3.28 MIL/uL — ABNORMAL LOW (ref 3.87–5.11)
RDW: 16.3 % — AB (ref 11.5–15.5)
WBC: 2.4 10*3/uL — ABNORMAL LOW (ref 4.0–10.5)
nRBC: 0 % (ref 0.0–0.2)

## 2018-11-26 LAB — POCT I-STAT EG7
Acid-Base Excess: 7 mmol/L — ABNORMAL HIGH (ref 0.0–2.0)
Bicarbonate: 32.4 mmol/L — ABNORMAL HIGH (ref 20.0–28.0)
Calcium, Ion: 0.99 mmol/L — ABNORMAL LOW (ref 1.15–1.40)
HEMATOCRIT: 29 % — AB (ref 36.0–46.0)
Hemoglobin: 9.9 g/dL — ABNORMAL LOW (ref 12.0–15.0)
O2 Saturation: 89 %
POTASSIUM: 4 mmol/L (ref 3.5–5.1)
Sodium: 138 mmol/L (ref 135–145)
TCO2: 34 mmol/L — ABNORMAL HIGH (ref 22–32)
pCO2, Ven: 51.3 mmHg (ref 44.0–60.0)
pH, Ven: 7.409 (ref 7.250–7.430)
pO2, Ven: 57 mmHg — ABNORMAL HIGH (ref 32.0–45.0)

## 2018-11-26 LAB — I-STAT TROPONIN, ED: Troponin i, poc: 0.02 ng/mL (ref 0.00–0.08)

## 2018-11-26 LAB — I-STAT BETA HCG BLOOD, ED (MC, WL, AP ONLY): I-stat hCG, quantitative: <5 m[IU]/mL (ref <5)

## 2018-11-26 LAB — MAGNESIUM: Magnesium: 2.6 mg/dL — ABNORMAL HIGH (ref 1.7–2.4)

## 2018-11-26 MED ORDER — SODIUM CHLORIDE 0.9% FLUSH: 3.0000 mL | Freq: Once | INTRAVENOUS | Status: DC

## 2018-11-26 MED ORDER — CALCIUM CARBONATE ANTACID 500 MG PO CHEW
1000.0000 mg | CHEWABLE_TABLET | Freq: Once | ORAL | Status: AC
  Administered 2018-11-26: 1000 mg via ORAL
  Filled 2018-11-26: qty 5

## 2018-11-26 NOTE — Telephone Encounter (Signed)
Just for clarification, Roselyn Reef stated she did see the note and she and Tillie Rung discussed it however, she was not sure what the outcome was as Dr. Yong Channel did not have availability at the time and she thought that Tillie Rung was taking care of it as she was the last respondent on the note.   After speaking with Tillie Rung, she stated that she did not see the last message about the note already being triaged. The patient called the [facility] this morning to schedule an appointment.

## 2018-11-26 NOTE — Telephone Encounter (Signed)
I was advised by Butch Penny that the patient was scheduled with [name] today and there was not a follow up note on the patients triage note that was last sent to Dr. Ansel Bong team yesterday. I went to speak with Tillie Rung as she was the last person to respond to the note to see if the patient was contacted, she was with a patient at the time. I spoke with Roselyn Reef who stated she did see the note and she and Tillie Rung discussed it however, she was not sure what the outcome was as Dr. Yong Channel did not have availability at the time and she thought the patient just needed to be scheduled. I explained why it was concerning that there was not a follow up note on the high priority note and that I would update Butch Penny since they were seeing the patient today. I am routing this note to both teams to close the loop on the triage.   No further action required at this time as patient is coming at 9:40am to see Len Blalock, PA-C.

## 2018-11-26 NOTE — ED Provider Notes (Signed)
[facility] DEPT Provider Note   CSN: [id number] Arrival date & time: 11/26/18  1152     History   Chief Complaint Chief Complaint  Patient presents with  . Tachycardia    HPI [name] [name] is a 38 y.o. female.  [name] [name] is a 38 y.o. female with a history of hypertension, anemia, lupus, CHF, ESRD on HD and TIA, who presents to the emergency department from her PCPs office for evaluation of palpitations.  Patient reports that since Friday she has been feeling palpitations.  She describes it as a heart racing sensation that lasts a few seconds, she reports this it seems to occur every few minutes and she sometimes has some associated chest tightness but no persistent chest pain, she denies any shortness of breath or pleuritic chest pain.  She denies any lightheadedness, dizziness or syncope.  No abdominal pain, nausea or vomiting.  Patient is on dialysis and has not missed any recent treatments, last dialysis yesterday which she completed without difficulty.  She reports over the past she would get occasional palpitations during dialysis but she has never had palpitations this persistent before.  Had been seeing a cardiologist in [place] but has not followed up with them recently.  Due to patient's past medical history and persistent symptoms over the past 4 days she was sent by her PCP for evaluation.  She reports intermittent palpitations since arriving in the emergency department, but no chest pain or shortness of breath.  She is never had any history of arrhythmia, normal heart rate on arrival.     Past Medical History:  Diagnosis Date  . CHF (congestive heart failure) ([place]) 5-6 yrs ago  hosp  . Chronic kidney disease   . ERYTHEMATOSUS, LUPUS 08/07/2006  . Hypertension   . Intestinal infection due to Clostridium difficile   . Secondary cardiomyopathy, unspecified   . Sleep apnea    does not wear CPAP  . Systemic lupus erythematosus  ([place])   . TIA (transient ischemic attack)   . Unspecified deficiency anemia   . Unspecified essential hypertension     Patient Active Problem List   Diagnosis Date Noted  . Gastroesophageal reflux disease   . Acute blood loss anemia 07/25/2018  . Heme positive stool 07/25/2018  . Acquired hallux valgus of right foot 06/15/2017  . 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 Plantar fasciitis of right foot 06/15/2017  . Atypical chest pain 11/27/2016  . CKD (chronic kidney disease) stage V requiring chronic peritoneal dialysis  03/17/2016  . History of recurrent cellulitis of lower leg 03/09/2016  . Intractable nausea and vomiting 02/20/2016  . ESRD on dialysis ([place]) 08/05/2015  . Enteritis due to Clostridium difficile 05/03/2015  . Onychomycosis 12/02/2014  . Gout 08/14/2012  . Essential hypertension 09/22/2010  . Pulmonary hypertension ([place]) 07/28/2010  . Secondary cardiomyopathy ([place]) 07/14/2009  . LUPUS NEPHRITIS 07/14/2009  . History of TIA (transient ischemic attack) 07/14/2009  . Anemia 08/07/2006  . ERYTHEMATOSUS, LUPUS 08/07/2006    Past Surgical History:  Procedure Laterality Date  . AV FISTULA PLACEMENT Right 05/23/2016   Procedure: ARTERIOVENOUS (AV) FISTULA CREATION VERSUS GRAFT INSERTION;  Surgeon: Angelia Mould, MD;  Location: [facility];  Service: Vascular;  Laterality: Right;  . BIOPSY  08/12/2018   Procedure: BIOPSY;  Surgeon: Ladene Artist, MD;  Location: [facility];  Service: Endoscopy;;  . COLONOSCOPY WITH PROPOFOL N/A 08/12/2018   Procedure: COLONOSCOPY WITH PROPOFOL;  Surgeon: Ladene Artist, MD;  Location: [facility];  Service: Endoscopy;  Laterality: N/A;  . ESOPHAGOGASTRODUODENOSCOPY (EGD) WITH PROPOFOL N/A 08/12/2018   Procedure: ESOPHAGOGASTRODUODENOSCOPY (EGD) WITH PROPOFOL;  Surgeon: Ladene Artist, MD;  Location: [facility];  Service: Endoscopy;  Laterality: N/A;  . kidney biopsies     to determine lupus nephritis     OB History    Gravida    1   Para      Term      Preterm      AB      Living        SAB      TAB      Ectopic      Multiple      Live Births               Home Medications    Prior to Admission medications   Medication Sig Start Date End Date Taking? Authorizing Provider  amLODipine (NORVASC) 10 MG tablet Take 10 mg by mouth every evening.  05/27/18  Yes [provider]  b complex-vitamin c-folic acid (NEPHRO-VITE) 0.8 MG TABS tablet Take 1 tablet by mouth daily.   Yes [provider]  calcium carbonate (TUMS EX) 750 MG chewable tablet Chew 2 tablets by mouth daily as needed for heartburn (chews 2 tablets if needed for heartburn and stomach issues).   Yes [provider]  carvedilol (COREG) 25 MG tablet Take 25 mg by mouth 2 (two) times daily.  05/06/18  Yes [provider]  cloNIDine (CATAPRES) 0.1 MG tablet Take 0.1 mg by mouth 2 (two) times daily. 04/24/18  Yes [provider]  hydroxychloroquine (PLAQUENIL) 200 MG tablet TAKE 1 TABLET BY MOUTH TWICE A DAY Patient taking differently: Take 100-200 mg by mouth See admin instructions. Take 1/2 tablet (100 mg) by mouth daily in the morning & take 1 tablet (200 mg) by mouth at night. 10/26/14  Yes Swords, Darrick Penna, MD  lidocaine-prilocaine (EMLA) cream Apply 1 application topically daily as needed. 10/24/18  Yes Marin Olp, MD  losartan (COZAAR) 100 MG tablet Take 100 mg by mouth every evening.  05/30/18  Yes [provider]  macitentan (OPSUMIT) 10 MG tablet Take 10 mg by mouth daily.   Yes [provider]  ondansetron (ZOFRAN-ODT) 4 MG disintegrating tablet Take 1 tablet (4 mg total) by mouth every 8 (eight) hours as needed for nausea or vomiting. 09/13/18  Yes Marin Olp, MD  pantoprazole (PROTONIX) 40 MG tablet Take 1 tablet (40 mg total) by mouth daily. 10/04/18  Yes Ladene Artist, MD  predniSONE (DELTASONE) 10 MG tablet Take 10 mg by mouth at bedtime.  01/26/16  Yes [provider]  RENVELA 800 MG tablet Take 800-3,200 mg by mouth as directed. Take 3-4 tablets by mouth daily with each meals & take 1-2 tablets by mouth daily with each snack. 01/26/16  Yes [provider]  triamcinolone ointment (KENALOG) 0.1 % Apply 1 application topically daily as needed (FOR RASH/SKIN IRRITATION.).   Yes [provider]    Family History Family History  Problem Relation Age of Onset  . Lupus Mother   . Cancer Maternal Grandmother        unknown  . Prostate cancer Paternal Grandfather   . Diabetes Brother   . Diabetes Other        mat cousin    Social History Social History   Tobacco Use  . Smoking status: Never Smoker  . Smokeless tobacco: Never Used  Substance Use Topics  . Alcohol use: No    Alcohol/week: 0.0 standard drinks  . Drug use: No     Allergies   Lisinopril and Nsaids   Review of Systems Review of Systems  Constitutional: Negative for chills and fever.  HENT: Negative.   Eyes: Negative for visual disturbance.  Respiratory: Positive for chest tightness. Negative for cough, shortness of breath and wheezing.   Cardiovascular: Positive for palpitations. Negative for chest pain and leg swelling.  Gastrointestinal: Negative for abdominal pain, diarrhea, nausea and vomiting.  Genitourinary: Negative for dysuria and frequency.  Musculoskeletal: Negative for arthralgias and myalgias.  Skin: Negative for color change and rash.  Neurological: Negative for dizziness, syncope, light-headedness and headaches.     Physical Exam Updated Vital Signs BP 110/75 (BP Location: Right Arm)   Pulse 83   Temp 98.9 F (37.2 C) (Oral)   Resp 18   LMP 11/24/2018   SpO2 100%   Physical Exam Vitals signs and nursing note reviewed.  Constitutional:      General: She is not in acute distress.    Appearance: Normal appearance. She is well-developed and normal weight. She is not ill-appearing or diaphoretic.  HENT:     Head:  Normocephalic and atraumatic.     Mouth/Throat:     Mouth: Mucous membranes are moist.     Pharynx: Oropharynx is clear.  Eyes:     General:        Right eye: No discharge.        Left eye: No discharge.     Pupils: Pupils are equal, round, and reactive to light.  Neck:     Musculoskeletal: Neck supple.  Cardiovascular:     Rate and Rhythm: Normal rate and regular rhythm.     Pulses: Normal pulses.     Heart sounds: Normal heart sounds. No murmur. No friction rub. No gallop.   Pulmonary:     Effort: Pulmonary effort is normal. No respiratory distress.     Breath sounds: Normal breath sounds. No wheezing or rales.     Comments: Respirations equal and unlabored, patient able to speak in full sentences, lungs clear to auscultation bilaterally Abdominal:     General: Abdomen is flat. Bowel sounds are normal. There is no distension.     Palpations: Abdomen is soft. There is no mass.     Tenderness: There is no abdominal tenderness. There is no guarding.     Comments: Abdomen soft, nondistended, nontender to palpation in all quadrants without guarding or peritoneal signs  Musculoskeletal:        General: No deformity.     Right lower leg: No edema.     Left lower leg: No edema.  Skin:    General: Skin is warm and dry.     Capillary Refill: Capillary refill takes less than 2 seconds.  Neurological:     Mental Status: She is alert and oriented to person, place, and time.     Coordination: Coordination normal.     Comments: Speech is clear, able to follow commands Moves extremities without ataxia, coordination intact   Psychiatric:        Mood and Affect: Mood normal.        Behavior: Behavior normal.      ED Treatments / Results  Labs (all labs ordered are listed, but only abnormal results are displayed) Labs Reviewed  BASIC METABOLIC PANEL - Abnormal; Notable for the following components:      Result Value  Chloride 95 (*)    BUN 46 (*)    Creatinine, Ser 10.86 (*)     Calcium 7.7 (*)    GFR calc non Af Amer 4 (*)    GFR calc Af Amer 5 (*)    All other components within normal limits  CBC - Abnormal; Notable for the following components:   WBC 2.4 (*)    RBC 3.28 (*)    Hemoglobin 9.9 (*)    HCT 32.3 (*)    RDW 16.3 (*)    All other components within normal limits  POCT I-STAT EG7 - Abnormal; Notable for the following components:   pO2, Ven 57.0 (*)    Bicarbonate 32.4 (*)    TCO2 34 (*)    Acid-Base Excess 7.0 (*)    Calcium, Ion 0.99 (*)    HCT 29.0 (*)    Hemoglobin 9.9 (*)    All other components within normal limits  MAGNESIUM  I-STAT TROPONIN, ED  I-STAT BETA HCG BLOOD, ED ([facility], [facility], [facility] ONLY)    EKG EKG Interpretation  Date/Time:  Tuesday November 26 2018 16:16:47 EST Ventricular Rate:  70 PR Interval:    QRS Duration: 103 QT Interval:  472 QTC Calculation: 510 R Axis:   44 Text Interpretation:  Sinus rhythm Prolonged PR interval LAE, consider biatrial enlargement Borderline prolonged QT interval Confirmed by Lennice Sites [phone]) on 11/26/2018 4:21:48 PM   Radiology No results found.  Procedures Procedures (including critical care time)  Medications Ordered in ED Medications  calcium carbonate (TUMS - dosed in mg elemental calcium) chewable tablet 1,000 mg of elemental calcium (1,000 mg of elemental calcium Oral Given 11/26/18 1916)     Initial Impression / Assessment and Plan / ED Course  I have reviewed the triage vital signs and the nursing notes.  Pertinent labs & imaging results that were available during my care of the patient were reviewed by me and considered in my medical decision making (see chart for details).  Patient presents to the emergency department for evaluation of palpitations, on arrival patient with normal vitals and in no acute distress.  EKG shows normal sinus rhythm with no evidence of arrhythmia, does have borderline QTC prolongation although on review of patient notes she has had this in the past  as well.  Patient on cardiac monitoring throughout ED stay without any evidence of arrhythmia.  She reports some chest tightness occasionally with these brief episodes of palpitations but no persistent chest pain, no shortness of breath, lightheadedness or syncope.  Has seen a cardiologist in the past and has a history of CHF but has not followed up with one recently, was sent by her PCP today for further evaluation of the symptoms.  Is on hemodialysis and has not missed any recent dialysis treatments.  Basic labs, troponin, hCG and EKG ordered from triage.  Creatinine elevated as expected in setting of ESRD, but potassium is normal, patient with hypocalcemia of 7.7 but no other acute electrolyte derangements with blood cell count of 2.4 and hemoglobin of 9.9, at baseline, negative pregnancy and negative troponin.  Discussed hypocalcemia with Dr. Ronnald Nian, will check ionized calcium as well as a magnesium level.  Patient's EKG with borderline QT changes but patient has had a history of this in the past.  Will check electrolytes and then discuss with nephrology if they would recommend correcting calcium more if this is something that can be corrected with dialysis.  Ionized calcium of 0.99 which is just below  normal and mag of 2.6.  Case discussed with Dr. Posey Pronto with nephrology who does feel that dialysis can help with hypercalcemia but also recommends giving the patient 1000 mg of elemental calcium (calcium carbonate) once daily at bedtime for the next 2 weeks.  Patient has dialysis tomorrow we will also provide cardiology referral for patient to obtain outpatient follow-up once again.  Suspect the palpitations patient may be feeling or PVCs or PACs given reassuring cardiac monitoring throughout ED stay.  At this time feel patient is stable for discharge home.  Answered questions.  Patient expresses understanding and agreement with plan.   Final Clinical Impressions(s) / ED Diagnoses   Final diagnoses:    Palpitations  Hypocalcemia  ESRD on hemodialysis [facility])    ED Discharge Orders    None       Jacqlyn Larsen, PA-C 11/28/18 1622    Lennice Sites, DO 11/29/18 0050

## 2018-11-26 NOTE — ED Triage Notes (Signed)
Per pt, states last dialysis was yesterday-states she feels like her heart is racing-denies CP, SOB

## 2018-11-26 NOTE — Progress Notes (Signed)
[name] [name] is a 38 y.o. female here for a new problem.  I acted as a Education administrator for [employer], PA-C Anselmo Pickler, LPN  History of Present Illness:   Chief Complaint  Patient presents with  . Irregular Heart Beat    HPI   Irregular Heartbeat Significant past medical history of essential HTN, TIA, anemia, SLE, ESRD on HD, CHF who presents with c/o irregular heartbeat started on Friday it is has been happening everyday most of the day like every 30 seconds. Having fatigue, chest pressure. Pt denies numbness/tingling, blurred vision, dizziness, SOB, worsening edema of LE, or headaches. Currently not on   She states that she has not missed any dialysis sessions and that she is compliant with medications.  Has history of seeing Dr. Perry Mount at [facility]. Last visit in May 2019. Was told to follow-up around 05/30/18, has not had follow-up since.   BP Readings from Last 3 Encounters:  11/26/18 100/70  11/21/18 119/81  09/26/18 100/64     Past Medical History:  Diagnosis Date  . CHF (congestive heart failure) ([place]) 5-6 yrs ago  hosp  . Chronic kidney disease   . ERYTHEMATOSUS, LUPUS 08/07/2006  . Hypertension   . Intestinal infection due to Clostridium difficile   . Secondary cardiomyopathy, unspecified   . Sleep apnea    does not wear CPAP  . Systemic lupus erythematosus ([place])   . TIA (transient ischemic attack)   . Unspecified deficiency anemia   . Unspecified essential hypertension      Social History   Socioeconomic History  . Marital status: Single    Spouse name: Not on file  . Number of children: 0  . Years of education: Not on file  . Highest education level: Not on file  Occupational History  . Occupation: Education administrator   . Occupation: Scientist, clinical (histocompatibility and immunogenetics): [employer]  Social Needs  . Financial resource strain: Not on file  . Food insecurity:    Worry: Not on file    Inability: Not on file  . Transportation needs:     Medical: Not on file    Non-medical: Not on file  Tobacco Use  . Smoking status: Never Smoker  . Smokeless tobacco: Never Used  Substance and Sexual Activity  . Alcohol use: No    Alcohol/week: 0.0 standard drinks  . Drug use: No  . Sexual activity: Never  Lifestyle  . Physical activity:    Days per week: Not on file    Minutes per session: Not on file  . Stress: Not on file  Relationships  . Social connections:    Talks on phone: Not on file    Gets together: Not on file    Attends religious service: Not on file    Active member of club or organization: Not on file    Attends meetings of clubs or organizations: Not on file    Relationship status: Not on file  . Intimate partner violence:    Fear of current or ex partner: Not on file    Emotionally abused: Not on file    Physically abused: Not on file    Forced sexual activity: Not on file  Other Topics Concern  . Not on file  Social History Narrative   Lives with sister.       Works at [employer]: cooking, watch movies, time with friends    Past Surgical History:  Procedure Laterality Date  . AV FISTULA PLACEMENT Right 05/23/2016   Procedure: ARTERIOVENOUS (AV) FISTULA CREATION VERSUS GRAFT INSERTION;  Surgeon: Angelia Mould, MD;  Location: [facility];  Service: Vascular;  Laterality: Right;  . BIOPSY  08/12/2018   Procedure: BIOPSY;  Surgeon: Ladene Artist, MD;  Location: [facility];  Service: Endoscopy;;  . COLONOSCOPY WITH PROPOFOL N/A 08/12/2018   Procedure: COLONOSCOPY WITH PROPOFOL;  Surgeon: Ladene Artist, MD;  Location: [facility];  Service: Endoscopy;  Laterality: N/A;  . ESOPHAGOGASTRODUODENOSCOPY (EGD) WITH PROPOFOL N/A 08/12/2018   Procedure: ESOPHAGOGASTRODUODENOSCOPY (EGD) WITH PROPOFOL;  Surgeon: Ladene Artist, MD;  Location: [facility];  Service: Endoscopy;  Laterality: N/A;  . kidney biopsies     to determine lupus nephritis    Family History  Problem  Relation Age of Onset  . Lupus Mother   . Cancer Maternal Grandmother        unknown  . Prostate cancer Paternal Grandfather   . Diabetes Brother   . Diabetes Unknown        mat cousin    Allergies  Allergen Reactions  . Lisinopril Swelling    SWELLING OF TOP LIP  . Nsaids Other (See Comments)    MD TOLD PATIENT TO AVOID NSAIDS DUE TO RENAL INSUFFICIENY.    Current Medications:   Current Outpatient Medications:  .  amLODipine (NORVASC) 10 MG tablet, Take 10 mg by mouth every evening. , Disp: , Rfl: 4 .  b complex-vitamin c-folic acid (NEPHRO-VITE) 0.8 MG TABS tablet, Take 1 tablet by mouth daily., Disp: , Rfl:  .  carvedilol (COREG) 25 MG tablet, Take 25 mg by mouth 2 (two) times daily. , Disp: , Rfl: 9 .  cloNIDine (CATAPRES) 0.1 MG tablet, Take 0.1 mg by mouth 2 (two) times daily., Disp: , Rfl: 0 .  hydroxychloroquine (PLAQUENIL) 200 MG tablet, TAKE 1 TABLET BY MOUTH TWICE A DAY (Patient taking differently: Take 100-200 mg by mouth See admin instructions. Take 1 tablet (200 mg) by mouth daily in the morning & take 0.5 tablet (100 mg) by mouth at night.), Disp: 60 tablet, Rfl: 1 .  lidocaine-prilocaine (EMLA) cream, Apply 1 application topically daily as needed., Disp: 30 g, Rfl: 0 .  losartan (COZAAR) 100 MG tablet, Take 100 mg by mouth every evening. , Disp: , Rfl: 11 .  macitentan (OPSUMIT) 10 MG tablet, Take 10 mg by mouth daily., Disp: , Rfl:  .  ondansetron (ZOFRAN-ODT) 4 MG disintegrating tablet, Take 1 tablet (4 mg total) by mouth every 8 (eight) hours as needed for nausea or vomiting., Disp: 30 tablet, Rfl: 2 .  pantoprazole (PROTONIX) 40 MG tablet, Take 1 tablet (40 mg total) by mouth daily., Disp: 30 tablet, Rfl: 2 .  predniSONE (DELTASONE) 10 MG tablet, Take 10 mg by mouth at bedtime. , Disp: , Rfl: 12 .  RENVELA 800 MG tablet, Take 800-3,200 mg by mouth as directed. Take 3-4 tablets by mouth daily with each meals & take 1-2 tablets by mouth daily with each snack., Disp:  , Rfl: 11 .  triamcinolone ointment (KENALOG) 0.1 %, Apply 1 application topically daily as needed (FOR RASH/SKIN IRRITATION.)., Disp: , Rfl:    Review of Systems:   Review of Systems  Constitutional: Negative for chills, fever, malaise/fatigue and weight loss.  Respiratory: Negative for shortness of breath.   Cardiovascular: Positive for chest pain and leg swelling. Negative for orthopnea and claudication.  Gastrointestinal: Negative for heartburn, nausea and vomiting.  Neurological:  Negative for dizziness, tingling and headaches.      Vitals:   Vitals:   11/26/18 0947  BP: 100/70  Pulse: 75  Temp: 98.2 F (36.8 C)  TempSrc: Oral  SpO2: 100%  Weight: 191 lb 8 oz (86.9 kg)  Height: 5\' 7"  (1.702 m)     Body mass index is 29.99 kg/m.  Physical Exam:   Physical Exam Vitals signs and nursing note reviewed.  Constitutional:      General: She is not in acute distress.    Appearance: She is well-developed. She is not ill-appearing or toxic-appearing.  Cardiovascular:     Rate and Rhythm: Normal rate and regular rhythm.     Pulses: Normal pulses.     Heart sounds: Normal heart sounds, S1 normal and S2 normal.     Comments: 1+ bilateral lower extremity edema Pulmonary:     Effort: Pulmonary effort is normal.     Breath sounds: Normal breath sounds.  Skin:    General: Skin is warm and dry.  Neurological:     Mental Status: She is alert.     GCS: GCS eye subscore is 4. GCS verbal subscore is 5. GCS motor subscore is 6.  Psychiatric:        Speech: Speech normal.        Behavior: Behavior normal. Behavior is cooperative.    EKG tracing is personally reviewed.  EKG notes NSR.     Assessment and Plan:   Rhyse was seen today for irregular heart beat.  Diagnoses and all orders for this visit:  Irregular heartbeat -     EKG 12-Lead   Given complex past medical history, lack of cardiology follow-up and ongoing symptoms, will send to ER for further  work-up.  Discussed that she needs cardiology care here at our office. If does not get referral through ED, discussed that she reach out to our office.  . Reviewed expectations re: course of current medical issues. . Discussed self-management of symptoms. . Outlined signs and symptoms indicating need for more acute intervention. . Patient verbalized understanding and all questions were answered. . See orders for this visit as documented in the electronic medical record. . Patient received an After-Visit Summary.  CMA or LPN served as scribe during this visit. History, Physical, and Plan performed by medical provider. The above documentation has been reviewed and is accurate and complete.   Inda Coke, PA-C

## 2018-11-26 NOTE — Telephone Encounter (Signed)
I reviewed the results of the capsule endo with the patient.  All questions answered.  She will call back for any additional questions or concerns.

## 2018-11-26 NOTE — Discharge Instructions (Addendum)
Your calcium was low today, please take 5 chewable tablets of calcium (Tums) daily at bedtime for the next 2 weeks.  I would like for you to follow with cardiology regarding these palpitations your work-up today is reassuring.  Continue with dialysis tomorrow as planned.  Return to the emergency department if you have persistent palpitations that do not go away, develop chest pain, shortness of breath feel lightheaded or like you may pass out or any other new or concerning symptoms.

## 2018-11-26 NOTE — Patient Instructions (Signed)
It was great to see you!  Please go to the ER for further evaluation.  If for some reason you do not get sent to a cardiologist locally, please let us know and we can send in a stat referral so you have one locally.  Take care,  Inda Coke PA-C

## 2018-11-27 ENCOUNTER — Telehealth: Payer: Self-pay | Admitting: Family Medicine

## 2018-11-27 DIAGNOSIS — N2581 Secondary hyperparathyroidism of renal origin: Secondary | ICD-10-CM | POA: Diagnosis not present

## 2018-11-27 DIAGNOSIS — R002 Palpitations: Secondary | ICD-10-CM

## 2018-11-27 DIAGNOSIS — D631 Anemia in chronic kidney disease: Secondary | ICD-10-CM | POA: Diagnosis not present

## 2018-11-27 DIAGNOSIS — E611 Iron deficiency: Secondary | ICD-10-CM | POA: Diagnosis not present

## 2018-11-27 DIAGNOSIS — N186 End stage renal disease: Secondary | ICD-10-CM | POA: Diagnosis not present

## 2018-11-27 NOTE — Telephone Encounter (Signed)
See note  Copied from [place] [id number]. Topic: Referral - Request for Referral >> Nov 27, 2018  4:03 PM Bea Graff, NT wrote: Has patient seen PCP for this complaint? Yes.   *If NO, is insurance requiring patient see PCP for this issue before PCP can refer them? Referral for which specialty: Cardiologist  Preferred provider/office: any Reason for referral: Heart palpitations. Seen in ER

## 2018-11-27 NOTE — Telephone Encounter (Signed)
I have confirmed with Dr. Yong Channel and he verbally approved the referral. Referral is needed to be placed and [facility] [place] can process after placing.

## 2018-11-27 NOTE — Addendum Note (Signed)
Addended by: Gwenyth Ober R on: 11/27/2018 05:29 PM   Modules accepted: Orders

## 2018-11-27 NOTE — Telephone Encounter (Signed)
Noted  Referral placed.

## 2018-11-28 ENCOUNTER — Encounter: Payer: Self-pay | Admitting: Family Medicine

## 2018-11-28 NOTE — Telephone Encounter (Signed)
Okay for Rheumatology referral?

## 2018-11-29 ENCOUNTER — Other Ambulatory Visit: Payer: Self-pay

## 2018-11-29 DIAGNOSIS — N2581 Secondary hyperparathyroidism of renal origin: Secondary | ICD-10-CM | POA: Diagnosis not present

## 2018-11-29 DIAGNOSIS — E611 Iron deficiency: Secondary | ICD-10-CM | POA: Diagnosis not present

## 2018-11-29 DIAGNOSIS — D631 Anemia in chronic kidney disease: Secondary | ICD-10-CM | POA: Diagnosis not present

## 2018-11-29 DIAGNOSIS — N186 End stage renal disease: Secondary | ICD-10-CM | POA: Diagnosis not present

## 2018-11-29 DIAGNOSIS — R002 Palpitations: Secondary | ICD-10-CM

## 2018-11-29 DIAGNOSIS — M329 Systemic lupus erythematosus, unspecified: Secondary | ICD-10-CM

## 2018-12-02 DIAGNOSIS — N186 End stage renal disease: Secondary | ICD-10-CM | POA: Diagnosis not present

## 2018-12-02 DIAGNOSIS — D631 Anemia in chronic kidney disease: Secondary | ICD-10-CM | POA: Diagnosis not present

## 2018-12-02 DIAGNOSIS — E611 Iron deficiency: Secondary | ICD-10-CM | POA: Diagnosis not present

## 2018-12-02 DIAGNOSIS — N2581 Secondary hyperparathyroidism of renal origin: Secondary | ICD-10-CM | POA: Diagnosis not present

## 2018-12-04 DIAGNOSIS — D631 Anemia in chronic kidney disease: Secondary | ICD-10-CM | POA: Diagnosis not present

## 2018-12-04 DIAGNOSIS — E611 Iron deficiency: Secondary | ICD-10-CM | POA: Diagnosis not present

## 2018-12-04 DIAGNOSIS — N2581 Secondary hyperparathyroidism of renal origin: Secondary | ICD-10-CM | POA: Diagnosis not present

## 2018-12-04 DIAGNOSIS — N186 End stage renal disease: Secondary | ICD-10-CM | POA: Diagnosis not present

## 2018-12-06 DIAGNOSIS — E611 Iron deficiency: Secondary | ICD-10-CM | POA: Diagnosis not present

## 2018-12-06 DIAGNOSIS — N186 End stage renal disease: Secondary | ICD-10-CM | POA: Diagnosis not present

## 2018-12-06 DIAGNOSIS — N2581 Secondary hyperparathyroidism of renal origin: Secondary | ICD-10-CM | POA: Diagnosis not present

## 2018-12-06 DIAGNOSIS — D631 Anemia in chronic kidney disease: Secondary | ICD-10-CM | POA: Diagnosis not present

## 2018-12-09 DIAGNOSIS — D631 Anemia in chronic kidney disease: Secondary | ICD-10-CM | POA: Diagnosis not present

## 2018-12-09 DIAGNOSIS — N2581 Secondary hyperparathyroidism of renal origin: Secondary | ICD-10-CM | POA: Diagnosis not present

## 2018-12-09 DIAGNOSIS — N186 End stage renal disease: Secondary | ICD-10-CM | POA: Diagnosis not present

## 2018-12-09 DIAGNOSIS — E611 Iron deficiency: Secondary | ICD-10-CM | POA: Diagnosis not present

## 2018-12-11 DIAGNOSIS — E611 Iron deficiency: Secondary | ICD-10-CM | POA: Diagnosis not present

## 2018-12-11 DIAGNOSIS — D631 Anemia in chronic kidney disease: Secondary | ICD-10-CM | POA: Diagnosis not present

## 2018-12-11 DIAGNOSIS — N2581 Secondary hyperparathyroidism of renal origin: Secondary | ICD-10-CM | POA: Diagnosis not present

## 2018-12-11 DIAGNOSIS — N186 End stage renal disease: Secondary | ICD-10-CM | POA: Diagnosis not present

## 2018-12-13 DIAGNOSIS — N2581 Secondary hyperparathyroidism of renal origin: Secondary | ICD-10-CM | POA: Diagnosis not present

## 2018-12-13 DIAGNOSIS — E611 Iron deficiency: Secondary | ICD-10-CM | POA: Diagnosis not present

## 2018-12-13 DIAGNOSIS — N186 End stage renal disease: Secondary | ICD-10-CM | POA: Diagnosis not present

## 2018-12-13 DIAGNOSIS — D631 Anemia in chronic kidney disease: Secondary | ICD-10-CM | POA: Diagnosis not present

## 2018-12-16 DIAGNOSIS — D631 Anemia in chronic kidney disease: Secondary | ICD-10-CM | POA: Diagnosis not present

## 2018-12-16 DIAGNOSIS — E611 Iron deficiency: Secondary | ICD-10-CM | POA: Diagnosis not present

## 2018-12-16 DIAGNOSIS — N2581 Secondary hyperparathyroidism of renal origin: Secondary | ICD-10-CM | POA: Diagnosis not present

## 2018-12-16 DIAGNOSIS — N186 End stage renal disease: Secondary | ICD-10-CM | POA: Diagnosis not present

## 2018-12-17 ENCOUNTER — Telehealth: Payer: Self-pay | Admitting: Gastroenterology

## 2018-12-17 MED ORDER — PANTOPRAZOLE SODIUM 40 MG PO TBEC: 40.0000 mg | DELAYED_RELEASE_TABLET | Freq: Every day | ORAL | 2 refills | Status: DC

## 2018-12-17 NOTE — Telephone Encounter (Signed)
Informed patient to continue pantoprazole daily per Dr. Lynne Leader last office note for her reflux symptoms. Patient verbalized understanding.

## 2018-12-17 NOTE — Telephone Encounter (Signed)
Pt called and stated that she was put on pantoprazole (PROTONIX) 40 MG tablet [[id number]]  By the pa and wants to know if she should continue to take the medication.

## 2018-12-18 DIAGNOSIS — D631 Anemia in chronic kidney disease: Secondary | ICD-10-CM | POA: Diagnosis not present

## 2018-12-18 DIAGNOSIS — N186 End stage renal disease: Secondary | ICD-10-CM | POA: Diagnosis not present

## 2018-12-18 DIAGNOSIS — E611 Iron deficiency: Secondary | ICD-10-CM | POA: Diagnosis not present

## 2018-12-18 DIAGNOSIS — N2581 Secondary hyperparathyroidism of renal origin: Secondary | ICD-10-CM | POA: Diagnosis not present

## 2018-12-19 NOTE — Progress Notes (Signed)
Cardiology Office Note   Date:  12/20/2018   ID:  [name] [name], [name] [name] 1981/08/15, MRN [id number]  PCP:  Marin Olp, MD  Cardiologist:   No primary care provider on file. Referring:  Marin Olp, MD  No chief complaint on file.     History of Present Illness: [name] [name] is a 38 y.o. female who presents for evaluation of palpitations.  She was in the ED recently for this.  I reviewed these records for this visit.    There was no significant dysrhythmia.    She was in [place] and did have some cardiac work up.  I reviewed notes in Care Everywhere.  She had an echo for dyspnea which demonstrated severe LVH with an EF of 60%.  She had moderate MR.  She has severe pulmonary HTN.   She had a ZIO patch.  I do not have these results but she does not think they found a significant dysrhythmia..  She had a cath last year with NL coronary arteries and severe pulm HTN not responsive to vasodilators.  It was thought that this might be related to pulmonary HTN.    I do note that she had a previous EF of 25% with severe LVH.  She had normal coronaries in 2007.  Her EF in 2015 had improved to 55 - 60%.    She has end-stage renal disease related to lupus.  She is moving back from [place] to get her care here.  She needs to be established with pulmonary who is been following her pulmonary hypertension.  She is seeing a rheumatologist.  She has a primary care doctor.  She said that when she was in the emergency room for about a week her heart has been beating very hard.  It was not fast.  She does feel it in her chest.  She was not having any presyncope or syncope.  She would occasionally feel it skipped but she was not having this as a predominant complaint.  This was happening at rest and throughout the day.  It slowly went away.  In the emergency room there was no significant arrhythmia.  She is no longer having the sensation.  She is very aware of her medical condition and  diagnostic tests.  Her blood pressure somewhat labile.  She will occasionally skip her medications if her blood pressure is low.  She tolerates dialysis.  She does not take her medicines for blood pressure on dialysis days.  She wears PRN oxygen.  Her oxygen sats sometimes falling to the 80s particular with activity and that is when she wears her oxygen.  She denies any chest pressure, neck or arm discomfort.  She has no PND or orthopnea.  She has no edema.   Past Medical History:  Diagnosis Date  . CHF (congestive heart failure) ([place]) 5-6 yrs ago  hosp  . Chronic kidney disease   . ERYTHEMATOSUS, LUPUS 08/07/2006  . Hypertension   . Intestinal infection due to Clostridium difficile   . Secondary cardiomyopathy, unspecified   . Sleep apnea    does not wear CPAP  . Systemic lupus erythematosus ([place])   . TIA (transient ischemic attack)   . Unspecified deficiency anemia   . Unspecified essential hypertension     Past Surgical History:  Procedure Laterality Date  . AV FISTULA PLACEMENT Right 05/23/2016   Procedure: ARTERIOVENOUS (AV) FISTULA CREATION VERSUS GRAFT INSERTION;  Surgeon: Angelia Mould, MD;  Location: [facility];  Service: Vascular;  Laterality: Right;  . BIOPSY  08/12/2018   Procedure: BIOPSY;  Surgeon: Ladene Artist, MD;  Location: [facility];  Service: Endoscopy;;  . COLONOSCOPY WITH PROPOFOL N/A 08/12/2018   Procedure: COLONOSCOPY WITH PROPOFOL;  Surgeon: Ladene Artist, MD;  Location: [facility];  Service: Endoscopy;  Laterality: N/A;  . ESOPHAGOGASTRODUODENOSCOPY (EGD) WITH PROPOFOL N/A 08/12/2018   Procedure: ESOPHAGOGASTRODUODENOSCOPY (EGD) WITH PROPOFOL;  Surgeon: Ladene Artist, MD;  Location: [facility];  Service: Endoscopy;  Laterality: N/A;  . kidney biopsies     to determine lupus nephritis     Current Outpatient Medications  Medication Sig Dispense Refill  . amLODipine (NORVASC) 10 MG tablet Take 10 mg by mouth every evening.   4  . b  complex-vitamin c-folic acid (NEPHRO-VITE) 0.8 MG TABS tablet Take 1 tablet by mouth daily.    . calcium carbonate (TUMS EX) 750 MG chewable tablet Chew 2 tablets by mouth daily as needed for heartburn (chews 2 tablets if needed for heartburn and stomach issues).    . carvedilol (COREG) 25 MG tablet Take 25 mg by mouth 2 (two) times daily.   9  . cloNIDine (CATAPRES) 0.1 MG tablet Take 0.1 mg by mouth 2 (two) times daily.  0  . hydroxychloroquine (PLAQUENIL) 200 MG tablet TAKE 1 TABLET BY MOUTH TWICE A DAY (Patient taking differently: Take 100-200 mg by mouth See admin instructions. Take 1/2 tablet (100 mg) by mouth daily in the morning & take 1 tablet (200 mg) by mouth at night.) 60 tablet 1  . lidocaine-prilocaine (EMLA) cream Apply 1 application topically daily as needed. 30 g 0  . losartan (COZAAR) 100 MG tablet Take 100 mg by mouth every evening.   11  . macitentan (OPSUMIT) 10 MG tablet Take 10 mg by mouth daily.    . ondansetron (ZOFRAN-ODT) 4 MG disintegrating tablet Take 1 tablet (4 mg total) by mouth every 8 (eight) hours as needed for nausea or vomiting. 30 tablet 2  . pantoprazole (PROTONIX) 40 MG tablet Take 1 tablet (40 mg total) by mouth daily. 30 tablet 2  . predniSONE (DELTASONE) 10 MG tablet Take 10 mg by mouth at bedtime.   12  . RENVELA 800 MG tablet Take 800-3,200 mg by mouth as directed. Take 3-4 tablets by mouth daily with each meals & take 1-2 tablets by mouth daily with each snack.  11  . triamcinolone ointment (KENALOG) 0.1 % Apply 1 application topically daily as needed (FOR RASH/SKIN IRRITATION.).     No current facility-administered medications for this visit.     Allergies:   Lisinopril and Nsaids    Social History:  The patient  reports that she has never smoked. She has never used smokeless tobacco. She reports that she does not drink alcohol or use drugs.   Family History:  The patient's family history includes Cancer in her maternal grandmother; Diabetes in her  brother and another family member; Hypertension in her father; Lupus in her mother; Prostate cancer in her paternal grandfather.    ROS:  Please see the history of present illness.   Otherwise, review of systems are positive for none.   All other systems are reviewed and negative.    PHYSICAL EXAM: VS:  BP 108/72 (BP Location: Left Arm)   Pulse 66   Ht 5\' 7"  (1.702 m)   Wt 190 lb 12.8 oz (86.5 kg)   LMP 11/24/2018   SpO2 93%   BMI 29.88 kg/m  ,  BMI Body mass index is 29.88 kg/m. GENERAL:  Well appearing HEENT:  Pupils equal round and reactive, fundi not visualized, oral mucosa unremarkable NECK: Positive jugular venous distention, waveform within normal limits, carotid upstroke brisk and symmetric, no bruits, no thyromegaly LYMPHATICS:  No cervical, inguinal adenopathy LUNGS:  Clear to auscultation bilaterally BACK:  No CVA tenderness CHEST:  Unremarkable HEART:  PMI not displaced or sustained,S1 and S2 within normal limits, no S3, no S4, no clicks, no rubs, transmitted bruit from her dialysis catheter, no murmurs ABD:  Flat, positive bowel sounds normal in frequency in pitch, no bruits, no rebound, no guarding, no midline pulsatile mass, no hepatomegaly, no splenomegaly EXT:  2 plus pulses throughout, no edema, no cyanosis no clubbing, right upper extremity thrill and bruit with dialysis fistula SKIN:  No rashes no nodules NEURO:  Cranial nerves II through XII grossly intact, motor grossly intact throughout PSYCH:  Cognitively intact, oriented to person place and time    EKG:  EKG is not ordered today. The ekg ordered 11/26/18 demonstrates sinus rhythm, rate 70, axis within normal limits, intervals within normal limits, no acute ST-T wave changes.   Recent Labs: 11/21/2018: ALT 13 11/26/2018: BUN 46; Creatinine, Ser 10.86; Hemoglobin 9.9; Magnesium 2.6; Platelets 155; Potassium 4.0; Sodium 138    Lipid Panel No results found for: CHOL, TRIG, HDL, CHOLHDL, VLDL, LDLCALC,  LDLDIRECT    Wt Readings from Last 3 Encounters:  12/20/18 190 lb 12.8 oz (86.5 kg)  11/26/18 191 lb 8 oz (86.9 kg)  11/21/18 190 lb 1.9 oz (86.2 kg)      Other studies Reviewed: Additional studies/ records that were reviewed today include: Extensive records reviewed care everywhere and documented as above.   (Greater than 60 9 minutes reviewing all data with greater than 50% face to face with the patient). Review of the above records demonstrates:  Please see elsewhere in the note.     ASSESSMENT AND PLAN:  SEVERE LVH: I going to follow this with an echo in March which we will 1 year from the previous.  This is related likely to her hypertension.  She also has lupus.  I do not suspect any other infiltrative etiology.  SEVERE PULMONARY HTN:    She had no response to vasodilators as above.  It was thought that pulm HTN was related to lupus..  She is to be followed by pulmonary medicine.  I have asked for a referral to pulmonary.  For now she will continue the meds as listed and I will assess this with the echo as above.  MODERATE MR: This will be followed with the echocardiogram.  PALPITATIONS: She is no longer experiencing these.  She is going to take PRN beta-blocker if she does have the sensation.  If it is happening fairly consistently I might give her propranolol to take PRN.  We also discussed using an Alive Cor to follow this.  HTN: She is very savvy about using her medicines as needed.  We discussed this in some detail.  No change in therapy.   Current medicines are reviewed at length with the patient today.  The patient does not have concerns regarding medicines.  The following changes have been made:  no change  Labs/ tests ordered today include:   Orders Placed This Encounter  Procedures  . ECHOCARDIOGRAM COMPLETE     Disposition:   FU with me in six months.     Signed, Minus Breeding, MD  12/20/2018 3:17 PM  [facility]

## 2018-12-20 ENCOUNTER — Ambulatory Visit (INDEPENDENT_AMBULATORY_CARE_PROVIDER_SITE_OTHER): Payer: Medicare Other | Admitting: Cardiology

## 2018-12-20 ENCOUNTER — Encounter: Payer: Self-pay | Admitting: Cardiology

## 2018-12-20 VITALS — BP 108/72 | HR 66 | Ht 67.0 in | Wt 190.8 lb

## 2018-12-20 DIAGNOSIS — I10 Essential (primary) hypertension: Secondary | ICD-10-CM | POA: Diagnosis not present

## 2018-12-20 DIAGNOSIS — I517 Cardiomegaly: Secondary | ICD-10-CM

## 2018-12-20 DIAGNOSIS — I340 Nonrheumatic mitral (valve) insufficiency: Secondary | ICD-10-CM | POA: Insufficient documentation

## 2018-12-20 DIAGNOSIS — D631 Anemia in chronic kidney disease: Secondary | ICD-10-CM | POA: Diagnosis not present

## 2018-12-20 DIAGNOSIS — N2581 Secondary hyperparathyroidism of renal origin: Secondary | ICD-10-CM | POA: Diagnosis not present

## 2018-12-20 DIAGNOSIS — R002 Palpitations: Secondary | ICD-10-CM

## 2018-12-20 DIAGNOSIS — I272 Pulmonary hypertension, unspecified: Secondary | ICD-10-CM

## 2018-12-20 DIAGNOSIS — N186 End stage renal disease: Secondary | ICD-10-CM | POA: Diagnosis not present

## 2018-12-20 DIAGNOSIS — E611 Iron deficiency: Secondary | ICD-10-CM | POA: Diagnosis not present

## 2018-12-20 NOTE — Patient Instructions (Signed)
Medication Instructions:  Continue current medications  If you need a refill on your cardiac medications before your next appointment, please call your pharmacy.  Labwork: None Ordered   Testing/Procedures: Your physician has requested that you have an echocardiogram in March. Echocardiography is a painless test that uses sound waves to create images of your heart. It provides your doctor with information about the size and shape of your heart and how well your heart's chambers and valves are working. This procedure takes approximately one hour. There are no restrictions for this procedure.   Follow-Up: You will need a follow up appointment in 6 months.  Please call our office 2 months in advance to schedule this appointment.  You may see Dr Percival Spanish or one of the following Advanced Practice Providers on your designated Care Team:   Rosaria Ferries, PA-C . Jory Sims, DNP, ANP   At [facility], you and your health needs are our priority.  As part of our continuing mission to provide you with exceptional heart care, we have created designated Provider Care Teams.  These Care Teams include your primary Cardiologist (physician) and Advanced Practice Providers (APPs -  Physician Assistants and Nurse Practitioners) who all work together to provide you with the care you need, when you need it.  Thank you for choosing [facility] at [facility]!!

## 2018-12-22 DIAGNOSIS — I158 Other secondary hypertension: Secondary | ICD-10-CM | POA: Diagnosis not present

## 2018-12-22 DIAGNOSIS — Z992 Dependence on renal dialysis: Secondary | ICD-10-CM | POA: Diagnosis not present

## 2018-12-22 DIAGNOSIS — N186 End stage renal disease: Secondary | ICD-10-CM | POA: Diagnosis not present

## 2018-12-23 ENCOUNTER — Telehealth: Payer: Self-pay | Admitting: *Deleted

## 2018-12-23 DIAGNOSIS — D631 Anemia in chronic kidney disease: Secondary | ICD-10-CM | POA: Diagnosis not present

## 2018-12-23 DIAGNOSIS — N2581 Secondary hyperparathyroidism of renal origin: Secondary | ICD-10-CM | POA: Diagnosis not present

## 2018-12-23 DIAGNOSIS — E875 Hyperkalemia: Secondary | ICD-10-CM | POA: Diagnosis not present

## 2018-12-23 DIAGNOSIS — N186 End stage renal disease: Secondary | ICD-10-CM | POA: Diagnosis not present

## 2018-12-23 NOTE — Telephone Encounter (Signed)
Spoke with pt. She has been scheduled for a consult with Dr. Lamonte Sakai on 12/25/2018 at 3:45pm. Nothing further was needed.

## 2018-12-23 NOTE — Telephone Encounter (Signed)
-----   Message from Collene Gobble, MD sent at 12/23/2018  9:02 AM EST ----- Yes would be happy to see her. We will contact her to set up.  Thanks to the referral! Best, rob ----- Message ----- From: Minus Breeding, MD Sent: 12/20/2018   2:34 PM EST To: Collene Gobble, MD  This lovely new patient for me has pulm HTN related to Lupus/diastolic dysfunction.  Moving here and needs a pulmonologist.  Would you see her.  She is very nice .  She is on macitentan.  Thanks.  Maylon Cos

## 2018-12-25 ENCOUNTER — Institutional Professional Consult (permissible substitution): Payer: 59 | Admitting: Emergency Medicine

## 2018-12-25 DIAGNOSIS — N186 End stage renal disease: Secondary | ICD-10-CM | POA: Diagnosis not present

## 2018-12-25 DIAGNOSIS — E875 Hyperkalemia: Secondary | ICD-10-CM | POA: Diagnosis not present

## 2018-12-25 DIAGNOSIS — D631 Anemia in chronic kidney disease: Secondary | ICD-10-CM | POA: Diagnosis not present

## 2018-12-25 DIAGNOSIS — N2581 Secondary hyperparathyroidism of renal origin: Secondary | ICD-10-CM | POA: Diagnosis not present

## 2018-12-27 DIAGNOSIS — D631 Anemia in chronic kidney disease: Secondary | ICD-10-CM | POA: Diagnosis not present

## 2018-12-27 DIAGNOSIS — N186 End stage renal disease: Secondary | ICD-10-CM | POA: Diagnosis not present

## 2018-12-27 DIAGNOSIS — E875 Hyperkalemia: Secondary | ICD-10-CM | POA: Diagnosis not present

## 2018-12-27 DIAGNOSIS — N2581 Secondary hyperparathyroidism of renal origin: Secondary | ICD-10-CM | POA: Diagnosis not present

## 2018-12-30 DIAGNOSIS — N2581 Secondary hyperparathyroidism of renal origin: Secondary | ICD-10-CM | POA: Diagnosis not present

## 2018-12-30 DIAGNOSIS — E875 Hyperkalemia: Secondary | ICD-10-CM | POA: Diagnosis not present

## 2018-12-30 DIAGNOSIS — D631 Anemia in chronic kidney disease: Secondary | ICD-10-CM | POA: Diagnosis not present

## 2018-12-30 DIAGNOSIS — N186 End stage renal disease: Secondary | ICD-10-CM | POA: Diagnosis not present

## 2018-12-31 ENCOUNTER — Ambulatory Visit (HOSPITAL_COMMUNITY): Payer: Medicare Other | Attending: Cardiovascular Disease

## 2018-12-31 DIAGNOSIS — I272 Pulmonary hypertension, unspecified: Secondary | ICD-10-CM

## 2019-01-01 DIAGNOSIS — D631 Anemia in chronic kidney disease: Secondary | ICD-10-CM | POA: Diagnosis not present

## 2019-01-01 DIAGNOSIS — E875 Hyperkalemia: Secondary | ICD-10-CM | POA: Diagnosis not present

## 2019-01-01 DIAGNOSIS — N186 End stage renal disease: Secondary | ICD-10-CM | POA: Diagnosis not present

## 2019-01-01 DIAGNOSIS — N2581 Secondary hyperparathyroidism of renal origin: Secondary | ICD-10-CM | POA: Diagnosis not present

## 2019-01-03 DIAGNOSIS — D631 Anemia in chronic kidney disease: Secondary | ICD-10-CM | POA: Diagnosis not present

## 2019-01-03 DIAGNOSIS — E875 Hyperkalemia: Secondary | ICD-10-CM | POA: Diagnosis not present

## 2019-01-03 DIAGNOSIS — N2581 Secondary hyperparathyroidism of renal origin: Secondary | ICD-10-CM | POA: Diagnosis not present

## 2019-01-03 DIAGNOSIS — N186 End stage renal disease: Secondary | ICD-10-CM | POA: Diagnosis not present

## 2019-01-06 DIAGNOSIS — E875 Hyperkalemia: Secondary | ICD-10-CM | POA: Diagnosis not present

## 2019-01-06 DIAGNOSIS — N186 End stage renal disease: Secondary | ICD-10-CM | POA: Diagnosis not present

## 2019-01-06 DIAGNOSIS — D631 Anemia in chronic kidney disease: Secondary | ICD-10-CM | POA: Diagnosis not present

## 2019-01-06 DIAGNOSIS — N2581 Secondary hyperparathyroidism of renal origin: Secondary | ICD-10-CM | POA: Diagnosis not present

## 2019-01-07 ENCOUNTER — Institutional Professional Consult (permissible substitution): Payer: 59 | Admitting: Emergency Medicine

## 2019-01-08 DIAGNOSIS — D631 Anemia in chronic kidney disease: Secondary | ICD-10-CM | POA: Diagnosis not present

## 2019-01-08 DIAGNOSIS — N186 End stage renal disease: Secondary | ICD-10-CM | POA: Diagnosis not present

## 2019-01-08 DIAGNOSIS — E875 Hyperkalemia: Secondary | ICD-10-CM | POA: Diagnosis not present

## 2019-01-08 DIAGNOSIS — N2581 Secondary hyperparathyroidism of renal origin: Secondary | ICD-10-CM | POA: Diagnosis not present

## 2019-01-10 DIAGNOSIS — E875 Hyperkalemia: Secondary | ICD-10-CM | POA: Diagnosis not present

## 2019-01-10 DIAGNOSIS — N2581 Secondary hyperparathyroidism of renal origin: Secondary | ICD-10-CM | POA: Diagnosis not present

## 2019-01-10 DIAGNOSIS — N186 End stage renal disease: Secondary | ICD-10-CM | POA: Diagnosis not present

## 2019-01-10 DIAGNOSIS — D631 Anemia in chronic kidney disease: Secondary | ICD-10-CM | POA: Diagnosis not present

## 2019-01-13 DIAGNOSIS — E875 Hyperkalemia: Secondary | ICD-10-CM | POA: Diagnosis not present

## 2019-01-13 DIAGNOSIS — D631 Anemia in chronic kidney disease: Secondary | ICD-10-CM | POA: Diagnosis not present

## 2019-01-13 DIAGNOSIS — N2581 Secondary hyperparathyroidism of renal origin: Secondary | ICD-10-CM | POA: Diagnosis not present

## 2019-01-13 DIAGNOSIS — N186 End stage renal disease: Secondary | ICD-10-CM | POA: Diagnosis not present

## 2019-01-15 DIAGNOSIS — E875 Hyperkalemia: Secondary | ICD-10-CM | POA: Diagnosis not present

## 2019-01-15 DIAGNOSIS — N186 End stage renal disease: Secondary | ICD-10-CM | POA: Diagnosis not present

## 2019-01-15 DIAGNOSIS — N2581 Secondary hyperparathyroidism of renal origin: Secondary | ICD-10-CM | POA: Diagnosis not present

## 2019-01-15 DIAGNOSIS — D631 Anemia in chronic kidney disease: Secondary | ICD-10-CM | POA: Diagnosis not present

## 2019-01-16 DIAGNOSIS — I7300 Raynaud's syndrome without gangrene: Secondary | ICD-10-CM | POA: Diagnosis not present

## 2019-01-16 DIAGNOSIS — M329 Systemic lupus erythematosus, unspecified: Secondary | ICD-10-CM | POA: Diagnosis not present

## 2019-01-16 DIAGNOSIS — I270 Primary pulmonary hypertension: Secondary | ICD-10-CM | POA: Diagnosis not present

## 2019-01-16 DIAGNOSIS — M1A09X0 Idiopathic chronic gout, multiple sites, without tophus (tophi): Secondary | ICD-10-CM | POA: Diagnosis not present

## 2019-01-16 DIAGNOSIS — M064 Inflammatory polyarthropathy: Secondary | ICD-10-CM | POA: Diagnosis not present

## 2019-01-16 DIAGNOSIS — Z79899 Other long term (current) drug therapy: Secondary | ICD-10-CM | POA: Diagnosis not present

## 2019-01-16 DIAGNOSIS — N184 Chronic kidney disease, stage 4 (severe): Secondary | ICD-10-CM | POA: Diagnosis not present

## 2019-01-16 DIAGNOSIS — M3214 Glomerular disease in systemic lupus erythematosus: Secondary | ICD-10-CM | POA: Diagnosis not present

## 2019-01-16 DIAGNOSIS — R21 Rash and other nonspecific skin eruption: Secondary | ICD-10-CM | POA: Diagnosis not present

## 2019-01-17 DIAGNOSIS — N2581 Secondary hyperparathyroidism of renal origin: Secondary | ICD-10-CM | POA: Diagnosis not present

## 2019-01-17 DIAGNOSIS — N186 End stage renal disease: Secondary | ICD-10-CM | POA: Diagnosis not present

## 2019-01-17 DIAGNOSIS — E875 Hyperkalemia: Secondary | ICD-10-CM | POA: Diagnosis not present

## 2019-01-17 DIAGNOSIS — D631 Anemia in chronic kidney disease: Secondary | ICD-10-CM | POA: Diagnosis not present

## 2019-01-20 DIAGNOSIS — N2581 Secondary hyperparathyroidism of renal origin: Secondary | ICD-10-CM | POA: Diagnosis not present

## 2019-01-20 DIAGNOSIS — D631 Anemia in chronic kidney disease: Secondary | ICD-10-CM | POA: Diagnosis not present

## 2019-01-20 DIAGNOSIS — E875 Hyperkalemia: Secondary | ICD-10-CM | POA: Diagnosis not present

## 2019-01-20 DIAGNOSIS — N186 End stage renal disease: Secondary | ICD-10-CM | POA: Diagnosis not present

## 2019-01-22 DIAGNOSIS — E611 Iron deficiency: Secondary | ICD-10-CM | POA: Diagnosis not present

## 2019-01-22 DIAGNOSIS — I158 Other secondary hypertension: Secondary | ICD-10-CM | POA: Diagnosis not present

## 2019-01-22 DIAGNOSIS — N186 End stage renal disease: Secondary | ICD-10-CM | POA: Diagnosis not present

## 2019-01-22 DIAGNOSIS — Z992 Dependence on renal dialysis: Secondary | ICD-10-CM | POA: Diagnosis not present

## 2019-01-22 DIAGNOSIS — N2581 Secondary hyperparathyroidism of renal origin: Secondary | ICD-10-CM | POA: Diagnosis not present

## 2019-01-24 DIAGNOSIS — N186 End stage renal disease: Secondary | ICD-10-CM | POA: Diagnosis not present

## 2019-01-24 DIAGNOSIS — N2581 Secondary hyperparathyroidism of renal origin: Secondary | ICD-10-CM | POA: Diagnosis not present

## 2019-01-24 DIAGNOSIS — E611 Iron deficiency: Secondary | ICD-10-CM | POA: Diagnosis not present

## 2019-01-27 DIAGNOSIS — N186 End stage renal disease: Secondary | ICD-10-CM | POA: Diagnosis not present

## 2019-01-27 DIAGNOSIS — N2581 Secondary hyperparathyroidism of renal origin: Secondary | ICD-10-CM | POA: Diagnosis not present

## 2019-01-27 DIAGNOSIS — E611 Iron deficiency: Secondary | ICD-10-CM | POA: Diagnosis not present

## 2019-01-29 DIAGNOSIS — E611 Iron deficiency: Secondary | ICD-10-CM | POA: Diagnosis not present

## 2019-01-29 DIAGNOSIS — N2581 Secondary hyperparathyroidism of renal origin: Secondary | ICD-10-CM | POA: Diagnosis not present

## 2019-01-29 DIAGNOSIS — N186 End stage renal disease: Secondary | ICD-10-CM | POA: Diagnosis not present

## 2019-01-31 DIAGNOSIS — N186 End stage renal disease: Secondary | ICD-10-CM | POA: Diagnosis not present

## 2019-01-31 DIAGNOSIS — N2581 Secondary hyperparathyroidism of renal origin: Secondary | ICD-10-CM | POA: Diagnosis not present

## 2019-01-31 DIAGNOSIS — E611 Iron deficiency: Secondary | ICD-10-CM | POA: Diagnosis not present

## 2019-02-03 DIAGNOSIS — N2581 Secondary hyperparathyroidism of renal origin: Secondary | ICD-10-CM | POA: Diagnosis not present

## 2019-02-03 DIAGNOSIS — N186 End stage renal disease: Secondary | ICD-10-CM | POA: Diagnosis not present

## 2019-02-03 DIAGNOSIS — E611 Iron deficiency: Secondary | ICD-10-CM | POA: Diagnosis not present

## 2019-02-05 DIAGNOSIS — N2581 Secondary hyperparathyroidism of renal origin: Secondary | ICD-10-CM | POA: Diagnosis not present

## 2019-02-05 DIAGNOSIS — E611 Iron deficiency: Secondary | ICD-10-CM | POA: Diagnosis not present

## 2019-02-05 DIAGNOSIS — N186 End stage renal disease: Secondary | ICD-10-CM | POA: Diagnosis not present

## 2019-02-07 DIAGNOSIS — N2581 Secondary hyperparathyroidism of renal origin: Secondary | ICD-10-CM | POA: Diagnosis not present

## 2019-02-07 DIAGNOSIS — E611 Iron deficiency: Secondary | ICD-10-CM | POA: Diagnosis not present

## 2019-02-07 DIAGNOSIS — N186 End stage renal disease: Secondary | ICD-10-CM | POA: Diagnosis not present

## 2019-02-10 DIAGNOSIS — E611 Iron deficiency: Secondary | ICD-10-CM | POA: Diagnosis not present

## 2019-02-10 DIAGNOSIS — N186 End stage renal disease: Secondary | ICD-10-CM | POA: Diagnosis not present

## 2019-02-10 DIAGNOSIS — N2581 Secondary hyperparathyroidism of renal origin: Secondary | ICD-10-CM | POA: Diagnosis not present

## 2019-02-12 DIAGNOSIS — N186 End stage renal disease: Secondary | ICD-10-CM | POA: Diagnosis not present

## 2019-02-12 DIAGNOSIS — E611 Iron deficiency: Secondary | ICD-10-CM | POA: Diagnosis not present

## 2019-02-12 DIAGNOSIS — N2581 Secondary hyperparathyroidism of renal origin: Secondary | ICD-10-CM | POA: Diagnosis not present

## 2019-02-14 DIAGNOSIS — N2581 Secondary hyperparathyroidism of renal origin: Secondary | ICD-10-CM | POA: Diagnosis not present

## 2019-02-14 DIAGNOSIS — E611 Iron deficiency: Secondary | ICD-10-CM | POA: Diagnosis not present

## 2019-02-14 DIAGNOSIS — N186 End stage renal disease: Secondary | ICD-10-CM | POA: Diagnosis not present

## 2019-02-17 DIAGNOSIS — E611 Iron deficiency: Secondary | ICD-10-CM | POA: Diagnosis not present

## 2019-02-17 DIAGNOSIS — N2581 Secondary hyperparathyroidism of renal origin: Secondary | ICD-10-CM | POA: Diagnosis not present

## 2019-02-17 DIAGNOSIS — N186 End stage renal disease: Secondary | ICD-10-CM | POA: Diagnosis not present

## 2019-02-19 DIAGNOSIS — N2581 Secondary hyperparathyroidism of renal origin: Secondary | ICD-10-CM | POA: Diagnosis not present

## 2019-02-19 DIAGNOSIS — E611 Iron deficiency: Secondary | ICD-10-CM | POA: Diagnosis not present

## 2019-02-19 DIAGNOSIS — N186 End stage renal disease: Secondary | ICD-10-CM | POA: Diagnosis not present

## 2019-02-21 ENCOUNTER — Telehealth: Payer: Self-pay | Admitting: Gastroenterology

## 2019-02-21 DIAGNOSIS — N2581 Secondary hyperparathyroidism of renal origin: Secondary | ICD-10-CM | POA: Diagnosis not present

## 2019-02-21 DIAGNOSIS — N939 Abnormal uterine and vaginal bleeding, unspecified: Secondary | ICD-10-CM | POA: Diagnosis not present

## 2019-02-21 DIAGNOSIS — N840 Polyp of corpus uteri: Secondary | ICD-10-CM | POA: Diagnosis not present

## 2019-02-21 DIAGNOSIS — N186 End stage renal disease: Secondary | ICD-10-CM | POA: Diagnosis not present

## 2019-02-21 DIAGNOSIS — I158 Other secondary hypertension: Secondary | ICD-10-CM | POA: Diagnosis not present

## 2019-02-21 DIAGNOSIS — Z992 Dependence on renal dialysis: Secondary | ICD-10-CM | POA: Diagnosis not present

## 2019-02-21 NOTE — Telephone Encounter (Signed)
Patient scheduled for a virtual visit with Dr. Fuller Plan on 02/25/19 3:00

## 2019-02-21 NOTE — Telephone Encounter (Signed)
Patient call said that she is experiencing new symptoms like a burning sensation en her stomach and when she moves. Does not think the med is working, or she is just having new symptoms. Would like to know what to do.

## 2019-02-24 DIAGNOSIS — N2581 Secondary hyperparathyroidism of renal origin: Secondary | ICD-10-CM | POA: Diagnosis not present

## 2019-02-24 DIAGNOSIS — N186 End stage renal disease: Secondary | ICD-10-CM | POA: Diagnosis not present

## 2019-02-25 ENCOUNTER — Encounter: Payer: Self-pay | Admitting: General Surgery

## 2019-02-26 ENCOUNTER — Ambulatory Visit (INDEPENDENT_AMBULATORY_CARE_PROVIDER_SITE_OTHER): Payer: 59 | Admitting: Gastroenterology

## 2019-02-26 ENCOUNTER — Other Ambulatory Visit: Payer: Self-pay

## 2019-02-26 ENCOUNTER — Encounter: Payer: Self-pay | Admitting: Gastroenterology

## 2019-02-26 VITALS — Ht 67.0 in | Wt 190.0 lb

## 2019-02-26 DIAGNOSIS — K219 Gastro-esophageal reflux disease without esophagitis: Secondary | ICD-10-CM

## 2019-02-26 DIAGNOSIS — R1084 Generalized abdominal pain: Secondary | ICD-10-CM

## 2019-02-26 DIAGNOSIS — N186 End stage renal disease: Secondary | ICD-10-CM | POA: Diagnosis not present

## 2019-02-26 DIAGNOSIS — N2581 Secondary hyperparathyroidism of renal origin: Secondary | ICD-10-CM | POA: Diagnosis not present

## 2019-02-26 MED ORDER — GLYCOPYRROLATE 2 MG PO TABS: 2.0000 mg | ORAL_TABLET | Freq: Two times a day (BID) | ORAL | 11 refills | Status: DC

## 2019-02-26 NOTE — Progress Notes (Signed)
    History of Present Illness: This is a 38 year old female with end-stage renal disease who relates intermittent episodes of generalized abdominal crampy pain.  The symptoms have been present for a couple months.  They are definitely worse following hemodialysis and are less bothersome on days when she does not have hemodialysis.  She underwent extensive evaluation for Hemoccult positive stool and anemia in late 2019 that inluded colonoscopy, EGD and VCE with findings of duodenitis, benign gastric fundic polyps, left colon diverticulosis and internal hemorrhoids.  RUQ [place] 07/2018 MPRESSION: Normal right upper quadrant ultrasound.   Current Medications, Allergies, Past Medical History, Past Surgical History, Family History and Social History were reviewed in [employer] record.   Physical Exam: Telemedicine - not performed    Assessment and Recommendations:  1. Intermittent crampy generalized abdominal pain.  Consider mesenteric insufficiency exacerbated by dialysis.  Trial of glycopyrrolate 2 mg po bid. If symptoms not substantially improved proceed with abdominal imaging and reassessment of dialysis parameters.  REV in 1 month.   2. GERD and duodenitis. Continue pantoprazole 40 mg po daily.   3. ESRD on HD.   4.  Anemia associated with CKD.   These services were provided via telemedicine, audio only per patient request.  The patient was at home and the provider was in the office, alone.  We discussed the limitations of evaluation and management by telemedicine and the availability of in person appointments.  Patient consented for this telemedicine visit and is aware of possible charges for this service.  The other person participating in the telemedicine service was Marlon Pel, [place] who reviewed medications, allergies, past history and completed AVS.  Time spent on call: 7 minutes

## 2019-02-26 NOTE — Patient Instructions (Signed)
We have sent the following medications to your pharmacy for you to pick up at your convenience: glycopyrrolate.   Continue pantoprazole 40 mg twice daily.   Thank you for choosing me and [facility].  Pricilla Riffle. Dagoberto Ligas., MD., Marval Regal

## 2019-02-28 DIAGNOSIS — N2581 Secondary hyperparathyroidism of renal origin: Secondary | ICD-10-CM | POA: Diagnosis not present

## 2019-02-28 DIAGNOSIS — N186 End stage renal disease: Secondary | ICD-10-CM | POA: Diagnosis not present

## 2019-03-03 DIAGNOSIS — N2581 Secondary hyperparathyroidism of renal origin: Secondary | ICD-10-CM | POA: Diagnosis not present

## 2019-03-03 DIAGNOSIS — N186 End stage renal disease: Secondary | ICD-10-CM | POA: Diagnosis not present

## 2019-03-05 DIAGNOSIS — N186 End stage renal disease: Secondary | ICD-10-CM | POA: Diagnosis not present

## 2019-03-05 DIAGNOSIS — N2581 Secondary hyperparathyroidism of renal origin: Secondary | ICD-10-CM | POA: Diagnosis not present

## 2019-03-07 DIAGNOSIS — N2581 Secondary hyperparathyroidism of renal origin: Secondary | ICD-10-CM | POA: Diagnosis not present

## 2019-03-07 DIAGNOSIS — N186 End stage renal disease: Secondary | ICD-10-CM | POA: Diagnosis not present

## 2019-03-10 DIAGNOSIS — N2581 Secondary hyperparathyroidism of renal origin: Secondary | ICD-10-CM | POA: Diagnosis not present

## 2019-03-10 DIAGNOSIS — N186 End stage renal disease: Secondary | ICD-10-CM | POA: Diagnosis not present

## 2019-03-11 DIAGNOSIS — N186 End stage renal disease: Secondary | ICD-10-CM | POA: Diagnosis not present

## 2019-03-11 DIAGNOSIS — T82858A Stenosis of vascular prosthetic devices, implants and grafts, initial encounter: Secondary | ICD-10-CM | POA: Diagnosis not present

## 2019-03-11 DIAGNOSIS — I871 Compression of vein: Secondary | ICD-10-CM | POA: Diagnosis not present

## 2019-03-11 DIAGNOSIS — Z992 Dependence on renal dialysis: Secondary | ICD-10-CM | POA: Diagnosis not present

## 2019-03-12 DIAGNOSIS — N186 End stage renal disease: Secondary | ICD-10-CM | POA: Diagnosis not present

## 2019-03-12 DIAGNOSIS — N2581 Secondary hyperparathyroidism of renal origin: Secondary | ICD-10-CM | POA: Diagnosis not present

## 2019-03-14 DIAGNOSIS — N186 End stage renal disease: Secondary | ICD-10-CM | POA: Diagnosis not present

## 2019-03-14 DIAGNOSIS — N2581 Secondary hyperparathyroidism of renal origin: Secondary | ICD-10-CM | POA: Diagnosis not present

## 2019-03-17 DIAGNOSIS — N2581 Secondary hyperparathyroidism of renal origin: Secondary | ICD-10-CM | POA: Diagnosis not present

## 2019-03-17 DIAGNOSIS — N186 End stage renal disease: Secondary | ICD-10-CM | POA: Diagnosis not present

## 2019-03-18 ENCOUNTER — Other Ambulatory Visit: Payer: Self-pay | Admitting: Gastroenterology

## 2019-03-19 DIAGNOSIS — N186 End stage renal disease: Secondary | ICD-10-CM | POA: Diagnosis not present

## 2019-03-19 DIAGNOSIS — N2581 Secondary hyperparathyroidism of renal origin: Secondary | ICD-10-CM | POA: Diagnosis not present

## 2019-03-21 DIAGNOSIS — N186 End stage renal disease: Secondary | ICD-10-CM | POA: Diagnosis not present

## 2019-03-21 DIAGNOSIS — N2581 Secondary hyperparathyroidism of renal origin: Secondary | ICD-10-CM | POA: Diagnosis not present

## 2019-03-24 DIAGNOSIS — N2581 Secondary hyperparathyroidism of renal origin: Secondary | ICD-10-CM | POA: Diagnosis not present

## 2019-03-24 DIAGNOSIS — D631 Anemia in chronic kidney disease: Secondary | ICD-10-CM | POA: Diagnosis not present

## 2019-03-24 DIAGNOSIS — Z992 Dependence on renal dialysis: Secondary | ICD-10-CM | POA: Diagnosis not present

## 2019-03-24 DIAGNOSIS — N186 End stage renal disease: Secondary | ICD-10-CM | POA: Diagnosis not present

## 2019-03-24 DIAGNOSIS — I158 Other secondary hypertension: Secondary | ICD-10-CM | POA: Diagnosis not present

## 2019-03-26 DIAGNOSIS — N186 End stage renal disease: Secondary | ICD-10-CM | POA: Diagnosis not present

## 2019-03-26 DIAGNOSIS — N2581 Secondary hyperparathyroidism of renal origin: Secondary | ICD-10-CM | POA: Diagnosis not present

## 2019-03-26 DIAGNOSIS — D631 Anemia in chronic kidney disease: Secondary | ICD-10-CM | POA: Diagnosis not present

## 2019-03-28 DIAGNOSIS — D631 Anemia in chronic kidney disease: Secondary | ICD-10-CM | POA: Diagnosis not present

## 2019-03-28 DIAGNOSIS — N186 End stage renal disease: Secondary | ICD-10-CM | POA: Diagnosis not present

## 2019-03-28 DIAGNOSIS — N2581 Secondary hyperparathyroidism of renal origin: Secondary | ICD-10-CM | POA: Diagnosis not present

## 2019-03-31 DIAGNOSIS — N2581 Secondary hyperparathyroidism of renal origin: Secondary | ICD-10-CM | POA: Diagnosis not present

## 2019-03-31 DIAGNOSIS — N186 End stage renal disease: Secondary | ICD-10-CM | POA: Diagnosis not present

## 2019-03-31 DIAGNOSIS — D631 Anemia in chronic kidney disease: Secondary | ICD-10-CM | POA: Diagnosis not present

## 2019-04-02 DIAGNOSIS — D631 Anemia in chronic kidney disease: Secondary | ICD-10-CM | POA: Diagnosis not present

## 2019-04-02 DIAGNOSIS — N2581 Secondary hyperparathyroidism of renal origin: Secondary | ICD-10-CM | POA: Diagnosis not present

## 2019-04-02 DIAGNOSIS — N186 End stage renal disease: Secondary | ICD-10-CM | POA: Diagnosis not present

## 2019-04-04 DIAGNOSIS — N186 End stage renal disease: Secondary | ICD-10-CM | POA: Diagnosis not present

## 2019-04-04 DIAGNOSIS — N2581 Secondary hyperparathyroidism of renal origin: Secondary | ICD-10-CM | POA: Diagnosis not present

## 2019-04-04 DIAGNOSIS — D631 Anemia in chronic kidney disease: Secondary | ICD-10-CM | POA: Diagnosis not present

## 2019-04-07 DIAGNOSIS — N2581 Secondary hyperparathyroidism of renal origin: Secondary | ICD-10-CM | POA: Diagnosis not present

## 2019-04-07 DIAGNOSIS — D631 Anemia in chronic kidney disease: Secondary | ICD-10-CM | POA: Diagnosis not present

## 2019-04-07 DIAGNOSIS — N186 End stage renal disease: Secondary | ICD-10-CM | POA: Diagnosis not present

## 2019-04-08 ENCOUNTER — Encounter: Payer: Self-pay | Admitting: Internal Medicine

## 2019-04-08 ENCOUNTER — Other Ambulatory Visit: Payer: Self-pay

## 2019-04-08 ENCOUNTER — Ambulatory Visit (INDEPENDENT_AMBULATORY_CARE_PROVIDER_SITE_OTHER): Payer: Medicare Other

## 2019-04-08 ENCOUNTER — Ambulatory Visit (INDEPENDENT_AMBULATORY_CARE_PROVIDER_SITE_OTHER): Payer: Medicare Other | Admitting: Internal Medicine

## 2019-04-08 DIAGNOSIS — R0609 Other forms of dyspnea: Secondary | ICD-10-CM | POA: Diagnosis not present

## 2019-04-08 DIAGNOSIS — R109 Unspecified abdominal pain: Secondary | ICD-10-CM | POA: Diagnosis not present

## 2019-04-08 NOTE — Patient Instructions (Addendum)
To get the most out of exercise, you need to be continuously aware that you are short of breath, but never out of breath, building up to 30 minutes daily and monitor your 02 saturations continuously while exercising with goal of 02 sat > 90%    As you improve, it will actually be easier for you to do the same amount of exercise  in  30 minutes so always push to the level where you are short of breath.    Classic subdiaphragmatic pain pattern suggests ibs:  Stereotypical, migratory with a very limited distribution of pain locations, daytime  worse in sitting position, frequently associated with generalized abd bloating, not as likely to be present supine due to the dome effect of the diaphragm which  is  canceled in that position. Frequently these patients have had multiple negative GI workups and CT scans.  Treatment consists of avoiding foods that cause gas (especially boiled eggs, mexcican food but especially  beans and undercooked vegetables like  spinach and some salads)  and citrucel 1 heaping tsp twice daily with a large glass of water.  Pain should improve w/in 2 weeks and if not then consider further GI work up.    I would also try off tums and take your calcium as calcium gluconate if ok with Dr Lorrene Reid   Please remember to go to the  x-ray department  for your tests - we will call you with the results when they are available    We will need to request records from Dr Dorris Singh in [place]  Please schedule a follow up office visit in 6 weeks, call sooner if needed with pfts first

## 2019-04-08 NOTE — Progress Notes (Signed)
[name] [name], female    DOB: 1981-05-04,     MRN: [id number]   Brief patient profile:  63 yobf never smoker disabled due to SLE  with onset of fatigue/hair loss/ arthritis  at age 38 (just like her mother) complicated by CRI around 2010 on HD since 2015 per Lorrene Reid and [facility] on opsumit per Dr Dorris Singh at [facility] referred to pulmonary clinic 04/08/2019 by Dr   Percival Spanish as no pulmonary doctor in [place]   On 02 2019 prn    History of Present Illness  04/08/2019  Pulmonary/ 1st office eval/Deseri Loss  Chief Complaint  Patient presents with  . Consult    Pulmonary HTN, C/O SOB and abd. pain after dialysis.  Dyspnea:  During HD better with 02 / M W F in am  Most of the time walking across the parking makes her sob,  Esp if walks fast, but p hd much worse for hours assoc with non-specific abd pain neg gi w/u  Cough: none Sleep: able to lie flat SABA use: none Prednisone 10 mg daily started "recently"  tapered to 5 mg "for her wbc which is high"   abd pain p hd can last all day, worse with activity/ no better with 02 /always resolves supine w/in 15 min   No obvious other patterns in  day to day or daytime variability or assoc excess/ purulent sputum or mucus plugs or hemoptysis chest tightness, subjective wheeze or overt sinus or hb symptoms.   Sleeping ok  without nocturnal  or early am exacerbation  of respiratory  c/o's or need for noct saba. Also denies any obvious fluctuation of symptoms with weather or environmental changes or other aggravating or alleviating factors except as outlined above   No unusual exposure hx or h/o childhood pna/ asthma or knowledge of premature birth.  Current Allergies, Complete Past Medical History, Past Surgical History, Family History, and Social History were reviewed in [employer] record.  ROS  The following are not active complaints unless bolded Hoarseness, sore throat, dysphagia, dental problems, itching, sneezing,  nasal  congestion or discharge of excess mucus or purulent secretions, ear ache,   fever, chills, sweats, unintended wt loss or wt gain, classically pleuritic or exertional cp,  orthopnea pnd or arm/hand swelling  or leg swelling, presyncope, palpitations, abdominal pain, anorexia, nausea, vomiting, diarrhea  or change in bowel habits or change in bladder habits, change in stools or change in urine, dysuria, hematuria,  rash, arthralgias, visual complaints, headache, numbness, weakness or ataxia or problems with walking or coordination,  change in mood or  memory.                  Past Medical History:  Diagnosis Date  . CHF (congestive heart failure) ([place]) 5-6 yrs ago  hosp  . Chronic kidney disease   . ERYTHEMATOSUS, LUPUS 08/07/2006  . Hypertension   . Intestinal infection due to Clostridium difficile   . Secondary cardiomyopathy, unspecified   . Sleep apnea    does not wear CPAP  . Systemic lupus erythematosus ([place])   . TIA (transient ischemic attack)   . Unspecified deficiency anemia   . Unspecified essential hypertension     Outpatient Medications Prior to Visit  Medication Sig Dispense Refill  . amLODipine (NORVASC) 10 MG tablet Take 10 mg by mouth every evening.   4  . b complex-vitamin c-folic acid (NEPHRO-VITE) 0.8 MG TABS tablet Take 1 tablet by mouth daily.    Marland Kitchen  calcium carbonate (TUMS EX) 750 MG chewable tablet Chew 2 tablets by mouth daily as needed for heartburn (chews 2 tablets if needed for heartburn and stomach issues).    . carvedilol (COREG) 25 MG tablet Take 25 mg by mouth 2 (two) times daily.   9  . cloNIDine (CATAPRES) 0.1 MG tablet Take 0.1 mg by mouth 2 (two) times daily.  0  . glycopyrrolate (ROBINUL) 2 MG tablet Take 1 tablet (2 mg total) by mouth 2 (two) times daily. 60 tablet 11  . hydroxychloroquine (PLAQUENIL) 200 MG tablet TAKE 1 TABLET BY MOUTH TWICE A DAY (Patient taking differently: Take 100-200 mg by mouth See admin instructions. Take 1/2 tablet (100  mg) by mouth daily in the morning & take 1 tablet (200 mg) by mouth at night.) 60 tablet 1  . lidocaine-prilocaine (EMLA) cream Apply 1 application topically daily as needed. 30 g 0  . losartan (COZAAR) 100 MG tablet Take 100 mg by mouth every evening.   11  . macitentan (OPSUMIT) 10 MG tablet Take 10 mg by mouth daily.    . ondansetron (ZOFRAN-ODT) 4 MG disintegrating tablet Take 1 tablet (4 mg total) by mouth every 8 (eight) hours as needed for nausea or vomiting. 30 tablet 2  . pantoprazole (PROTONIX) 40 MG tablet TAKE 1 TABLET BY MOUTH EVERY DAY 90 tablet 0  . predniSONE (DELTASONE) 10 MG tablet Take 10 mg by mouth at bedtime.   12  . RENVELA 800 MG tablet Take 800-3,200 mg by mouth as directed. Take 3-4 tablets by mouth daily with each meals & take 1-2 tablets by mouth daily with each snack.  11  . triamcinolone ointment (KENALOG) 0.1 % Apply 1 application topically daily as needed (FOR RASH/SKIN IRRITATION.).         Objective:     BP 122/84 (BP Location: Left Arm, Cuff Size: Normal)   Pulse 96   Temp 98.1 F (36.7 C) (Oral)   Ht 5\' 7"  (1.702 m)   Wt 193 lb (87.5 kg)   SpO2 100%   BMI 30.23 kg/m   SpO2: 100 %   amb bf/ somber affect  HEENT: nl dentition, turbinates bilaterally, and oropharynx. Nl external ear canals without cough reflex   NECK :  without JVD/Nodes/TM/ nl carotid upstrokes bilaterally   LUNGS: no acc muscle use,  Nl contour chest with decreased bs bases without cough on insp or exp maneuvers   CV:  RRR  no s3 or murmur or increase in P2, and no edema   ABD:  soft and nontender with nl inspiratory excursion in the supine position. No bruits or organomegaly appreciated, bowel sounds nl  MS:  Nl gait/ ext warm without deformities, calf tenderness, cyanosis or clubbing No obvious joint restrictions   SKIN: warm and dry without lesions    NEURO:  alert, approp, nl sensorium with  no motor or cerebellar deficits apparent.     CXR PA and Lateral:    04/08/2019 :    I personally reviewed images and agree with radiology impression as follows:   No acute disease. Cardiomegaly       Assessment   DOE   Onset summer 2019  Echo  12/31/2018  The left ventricle has normal systolic function with an ejection fraction of 60-65%. The cavity size was normal. There is moderate concentric left ventricular hypertrophy. Left ventricular diastolic parameters were normal.  2. The right ventricle has normal systolic function. The cavity was normal. There is no  increase in right ventricular wall thickness. Right ventricular systolic pressure is mildly elevated with an estimated pressure of 39.5 mmHg.  3. Left atrial size was mildly dilated.  4. Right atrial size was mildly dilated.- 04/08/2019   Walked RA  2 laps @  approx 256ft each @ moderate pace  stopped due to end of study with sats 100% at end and mild sob so rec regular sub max ex/ monitor 02 sats on and off hd and post HD     She carries dx of sle and not scleroderma but ? If she could have some overlap or MCTD dx and no problem with using opsimut here pending eval of records from [place] though I sense most of problems at this point are related to conditioning as the findings on today's walking study don't really support PAH or lung dz as being the problem.  rec sub max ex and return for pfts or at least 58min walk in 6 weeks with review of records in meantime    Abdominal pain Onset late 2019 > rx 04/08/2019 trial of ibs diet/ citrucel   Classic subdiaphragmatic pain pattern suggests ibs:  Stereotypical,  daytime, not usually exacerbated by exercise  or coughing, worse in sitting position, frequently associated with generalized abd bloating, not as likely to be present supine due to the dome effect of the diaphragm which  is  canceled in that position. Frequently these patients have had multiple negative GI workups and CT scans.  Treatment consists of avoiding foods that cause gas (especially  boiled eggs, mexcican food but especially  beans and undercooked vegetables like  spinach and some salads)  and citrucel 1 heaping tsp twice daily with a large glass of water.  Pain should improve w/in 2 weeks and if not then consider further GI work up.        Total time devoted to counseling  > 50 % of initial 60 min office visit:  reviewed case with pt/ directly observed portions of ambulatory 02 saturation study/  discussion of options/alternatives/ personally creating written customized instructions  in presence of pt  then going over those specific  Instructions directly with the pt including how to use all of the meds but in particular covering each new medication in detail and the difference between the maintenance= "automatic" meds and the prns using an action plan format for the latter (If this problem/symptom => do that organization reading Left to right).  Please see AVS from this visit for a full list of these instructions which I personally wrote for this pt and  are unique to this visit.     Christinia Gully, MD 04/08/2019

## 2019-04-09 ENCOUNTER — Encounter: Payer: Self-pay | Admitting: Internal Medicine

## 2019-04-09 DIAGNOSIS — D631 Anemia in chronic kidney disease: Secondary | ICD-10-CM | POA: Diagnosis not present

## 2019-04-09 DIAGNOSIS — N186 End stage renal disease: Secondary | ICD-10-CM | POA: Diagnosis not present

## 2019-04-09 DIAGNOSIS — R109 Unspecified abdominal pain: Secondary | ICD-10-CM | POA: Insufficient documentation

## 2019-04-09 DIAGNOSIS — N2581 Secondary hyperparathyroidism of renal origin: Secondary | ICD-10-CM | POA: Diagnosis not present

## 2019-04-09 NOTE — Assessment & Plan Note (Signed)
Onset late 2019 > rx 04/08/2019 trial of ibs diet/ citrucel    Classic subdiaphragmatic pain pattern suggests ibs:  Stereotypical,  daytime, not usually exacerbated by exercise  or coughing, worse in sitting position, frequently associated with generalized abd bloating, not as likely to be present supine due to the dome effect of the diaphragm which  is  canceled in that position. Frequently these patients have had multiple negative GI workups and CT scans.  Treatment consists of avoiding foods that cause gas (especially boiled eggs, mexcican food but especially  beans and undercooked vegetables like  spinach and some salads)  and citrucel 1 heaping tsp twice daily with a large glass of water.  Pain should improve w/in 2 weeks and if not then consider further GI work up.

## 2019-04-09 NOTE — Assessment & Plan Note (Signed)
Onset summer 2019  Echo  12/31/2018  The left ventricle has normal systolic function with an ejection fraction of 60-65%. The cavity size was normal. There is moderate concentric left ventricular hypertrophy. Left ventricular diastolic parameters were normal.  2. The right ventricle has normal systolic function. The cavity was normal. There is no increase in right ventricular wall thickness. Right ventricular systolic pressure is mildly elevated with an estimated pressure of 39.5 mmHg.  3. Left atrial size was mildly dilated.  4. Right atrial size was mildly dilated.- 04/08/2019   Walked RA  2 laps @  approx 26ft each @ moderate pace  stopped due to end of study with sats 100% at end and mild sob so rec regular sub max ex/ monitor 02 sats on and off hd and post HD     She carries dx of sle and not scleroderma but ? If she could have some overlap or MCTD dx and no problem with using opsimut here pending eval of records from [place] though I sense most of problems at this point are related to conditioning as the findings on today's walking study don't really support PAH or lung dz as being the problem.  rec sub max ex and return for pfts or at least 16min walk in 6 weeks with review of records in meantime   Total time devoted to counseling  > 50 % of initial 60 min office visit:  reviewed case with pt/ directly observed portions of ambulatory 02 saturation study/  discussion of options/alternatives/ personally creating written customized instructions  in presence of pt  then going over those specific  Instructions directly with the pt including how to use all of the meds but in particular covering each new medication in detail and the difference between the maintenance= "automatic" meds and the prns using an action plan format for the latter (If this problem/symptom => do that organization reading Left to right).  Please see AVS from this visit for a full list of these instructions which I personally  wrote for this pt and  are unique to this visit.

## 2019-04-09 NOTE — Progress Notes (Signed)
Spoke with pt and notified of results per Dr. Wert. Pt verbalized understanding and denied any questions. 

## 2019-04-11 DIAGNOSIS — D631 Anemia in chronic kidney disease: Secondary | ICD-10-CM | POA: Diagnosis not present

## 2019-04-11 DIAGNOSIS — N2581 Secondary hyperparathyroidism of renal origin: Secondary | ICD-10-CM | POA: Diagnosis not present

## 2019-04-11 DIAGNOSIS — N186 End stage renal disease: Secondary | ICD-10-CM | POA: Diagnosis not present

## 2019-04-14 DIAGNOSIS — D631 Anemia in chronic kidney disease: Secondary | ICD-10-CM | POA: Diagnosis not present

## 2019-04-14 DIAGNOSIS — N186 End stage renal disease: Secondary | ICD-10-CM | POA: Diagnosis not present

## 2019-04-14 DIAGNOSIS — N2581 Secondary hyperparathyroidism of renal origin: Secondary | ICD-10-CM | POA: Diagnosis not present

## 2019-04-15 ENCOUNTER — Telehealth: Payer: Self-pay | Admitting: Internal Medicine

## 2019-04-16 DIAGNOSIS — N2581 Secondary hyperparathyroidism of renal origin: Secondary | ICD-10-CM | POA: Diagnosis not present

## 2019-04-16 DIAGNOSIS — D631 Anemia in chronic kidney disease: Secondary | ICD-10-CM | POA: Diagnosis not present

## 2019-04-16 DIAGNOSIS — N186 End stage renal disease: Secondary | ICD-10-CM | POA: Diagnosis not present

## 2019-04-18 DIAGNOSIS — D631 Anemia in chronic kidney disease: Secondary | ICD-10-CM | POA: Diagnosis not present

## 2019-04-18 DIAGNOSIS — N2581 Secondary hyperparathyroidism of renal origin: Secondary | ICD-10-CM | POA: Diagnosis not present

## 2019-04-18 DIAGNOSIS — N186 End stage renal disease: Secondary | ICD-10-CM | POA: Diagnosis not present

## 2019-04-18 NOTE — Telephone Encounter (Signed)
Called and left message on pt vm to call back to schedule PFT -pr  °

## 2019-04-21 DIAGNOSIS — N186 End stage renal disease: Secondary | ICD-10-CM | POA: Diagnosis not present

## 2019-04-21 DIAGNOSIS — N2581 Secondary hyperparathyroidism of renal origin: Secondary | ICD-10-CM | POA: Diagnosis not present

## 2019-04-21 DIAGNOSIS — D631 Anemia in chronic kidney disease: Secondary | ICD-10-CM | POA: Diagnosis not present

## 2019-04-22 DIAGNOSIS — N184 Chronic kidney disease, stage 4 (severe): Secondary | ICD-10-CM | POA: Diagnosis not present

## 2019-04-22 DIAGNOSIS — I7300 Raynaud's syndrome without gangrene: Secondary | ICD-10-CM | POA: Diagnosis not present

## 2019-04-22 DIAGNOSIS — R21 Rash and other nonspecific skin eruption: Secondary | ICD-10-CM | POA: Diagnosis not present

## 2019-04-22 DIAGNOSIS — M3214 Glomerular disease in systemic lupus erythematosus: Secondary | ICD-10-CM | POA: Diagnosis not present

## 2019-04-22 DIAGNOSIS — M064 Inflammatory polyarthropathy: Secondary | ICD-10-CM | POA: Diagnosis not present

## 2019-04-22 DIAGNOSIS — M1A09X0 Idiopathic chronic gout, multiple sites, without tophus (tophi): Secondary | ICD-10-CM | POA: Diagnosis not present

## 2019-04-22 DIAGNOSIS — I270 Primary pulmonary hypertension: Secondary | ICD-10-CM | POA: Diagnosis not present

## 2019-04-22 DIAGNOSIS — E669 Obesity, unspecified: Secondary | ICD-10-CM | POA: Diagnosis not present

## 2019-04-22 DIAGNOSIS — M329 Systemic lupus erythematosus, unspecified: Secondary | ICD-10-CM | POA: Diagnosis not present

## 2019-04-22 DIAGNOSIS — Z6830 Body mass index (BMI) 30.0-30.9, adult: Secondary | ICD-10-CM | POA: Diagnosis not present

## 2019-04-22 DIAGNOSIS — Z79899 Other long term (current) drug therapy: Secondary | ICD-10-CM | POA: Diagnosis not present

## 2019-04-22 NOTE — Telephone Encounter (Signed)
Called and scheduled patient for PFT and f/u with Dr. Melvyn Novas on 05/27/2019 - mailed info sheet regarding PFT protocol -pr

## 2019-04-23 DIAGNOSIS — I158 Other secondary hypertension: Secondary | ICD-10-CM | POA: Diagnosis not present

## 2019-04-23 DIAGNOSIS — D631 Anemia in chronic kidney disease: Secondary | ICD-10-CM | POA: Diagnosis not present

## 2019-04-23 DIAGNOSIS — Z992 Dependence on renal dialysis: Secondary | ICD-10-CM | POA: Diagnosis not present

## 2019-04-23 DIAGNOSIS — N2581 Secondary hyperparathyroidism of renal origin: Secondary | ICD-10-CM | POA: Diagnosis not present

## 2019-04-23 DIAGNOSIS — N186 End stage renal disease: Secondary | ICD-10-CM | POA: Diagnosis not present

## 2019-04-25 DIAGNOSIS — N186 End stage renal disease: Secondary | ICD-10-CM | POA: Diagnosis not present

## 2019-04-25 DIAGNOSIS — D631 Anemia in chronic kidney disease: Secondary | ICD-10-CM | POA: Diagnosis not present

## 2019-04-25 DIAGNOSIS — N2581 Secondary hyperparathyroidism of renal origin: Secondary | ICD-10-CM | POA: Diagnosis not present

## 2019-04-28 DIAGNOSIS — D631 Anemia in chronic kidney disease: Secondary | ICD-10-CM | POA: Diagnosis not present

## 2019-04-28 DIAGNOSIS — N2581 Secondary hyperparathyroidism of renal origin: Secondary | ICD-10-CM | POA: Diagnosis not present

## 2019-04-28 DIAGNOSIS — N186 End stage renal disease: Secondary | ICD-10-CM | POA: Diagnosis not present

## 2019-04-29 DIAGNOSIS — I272 Pulmonary hypertension, unspecified: Secondary | ICD-10-CM | POA: Diagnosis not present

## 2019-04-29 DIAGNOSIS — M3219 Other organ or system involvement in systemic lupus erythematosus: Secondary | ICD-10-CM | POA: Diagnosis not present

## 2019-04-29 DIAGNOSIS — N19 Unspecified kidney failure: Secondary | ICD-10-CM | POA: Diagnosis not present

## 2019-04-30 DIAGNOSIS — N2581 Secondary hyperparathyroidism of renal origin: Secondary | ICD-10-CM | POA: Diagnosis not present

## 2019-04-30 DIAGNOSIS — N186 End stage renal disease: Secondary | ICD-10-CM | POA: Diagnosis not present

## 2019-04-30 DIAGNOSIS — D631 Anemia in chronic kidney disease: Secondary | ICD-10-CM | POA: Diagnosis not present

## 2019-05-02 DIAGNOSIS — N2581 Secondary hyperparathyroidism of renal origin: Secondary | ICD-10-CM | POA: Diagnosis not present

## 2019-05-02 DIAGNOSIS — D631 Anemia in chronic kidney disease: Secondary | ICD-10-CM | POA: Diagnosis not present

## 2019-05-02 DIAGNOSIS — N186 End stage renal disease: Secondary | ICD-10-CM | POA: Diagnosis not present

## 2019-05-05 DIAGNOSIS — D509 Iron deficiency anemia, unspecified: Secondary | ICD-10-CM | POA: Diagnosis not present

## 2019-05-05 DIAGNOSIS — N186 End stage renal disease: Secondary | ICD-10-CM | POA: Diagnosis not present

## 2019-05-05 DIAGNOSIS — N2581 Secondary hyperparathyroidism of renal origin: Secondary | ICD-10-CM | POA: Diagnosis not present

## 2019-05-05 DIAGNOSIS — D631 Anemia in chronic kidney disease: Secondary | ICD-10-CM | POA: Diagnosis not present

## 2019-05-07 DIAGNOSIS — N186 End stage renal disease: Secondary | ICD-10-CM | POA: Diagnosis not present

## 2019-05-07 DIAGNOSIS — N2581 Secondary hyperparathyroidism of renal origin: Secondary | ICD-10-CM | POA: Diagnosis not present

## 2019-05-07 DIAGNOSIS — D631 Anemia in chronic kidney disease: Secondary | ICD-10-CM | POA: Diagnosis not present

## 2019-05-07 DIAGNOSIS — D509 Iron deficiency anemia, unspecified: Secondary | ICD-10-CM | POA: Diagnosis not present

## 2019-05-09 DIAGNOSIS — N2581 Secondary hyperparathyroidism of renal origin: Secondary | ICD-10-CM | POA: Diagnosis not present

## 2019-05-09 DIAGNOSIS — D631 Anemia in chronic kidney disease: Secondary | ICD-10-CM | POA: Diagnosis not present

## 2019-05-09 DIAGNOSIS — D509 Iron deficiency anemia, unspecified: Secondary | ICD-10-CM | POA: Diagnosis not present

## 2019-05-09 DIAGNOSIS — N186 End stage renal disease: Secondary | ICD-10-CM | POA: Diagnosis not present

## 2019-05-12 DIAGNOSIS — D631 Anemia in chronic kidney disease: Secondary | ICD-10-CM | POA: Diagnosis not present

## 2019-05-12 DIAGNOSIS — D509 Iron deficiency anemia, unspecified: Secondary | ICD-10-CM | POA: Diagnosis not present

## 2019-05-12 DIAGNOSIS — N186 End stage renal disease: Secondary | ICD-10-CM | POA: Diagnosis not present

## 2019-05-12 DIAGNOSIS — N2581 Secondary hyperparathyroidism of renal origin: Secondary | ICD-10-CM | POA: Diagnosis not present

## 2019-05-14 DIAGNOSIS — D631 Anemia in chronic kidney disease: Secondary | ICD-10-CM | POA: Diagnosis not present

## 2019-05-14 DIAGNOSIS — N186 End stage renal disease: Secondary | ICD-10-CM | POA: Diagnosis not present

## 2019-05-14 DIAGNOSIS — D509 Iron deficiency anemia, unspecified: Secondary | ICD-10-CM | POA: Diagnosis not present

## 2019-05-14 DIAGNOSIS — N2581 Secondary hyperparathyroidism of renal origin: Secondary | ICD-10-CM | POA: Diagnosis not present

## 2019-05-16 DIAGNOSIS — D631 Anemia in chronic kidney disease: Secondary | ICD-10-CM | POA: Diagnosis not present

## 2019-05-16 DIAGNOSIS — N2581 Secondary hyperparathyroidism of renal origin: Secondary | ICD-10-CM | POA: Diagnosis not present

## 2019-05-16 DIAGNOSIS — N186 End stage renal disease: Secondary | ICD-10-CM | POA: Diagnosis not present

## 2019-05-16 DIAGNOSIS — D509 Iron deficiency anemia, unspecified: Secondary | ICD-10-CM | POA: Diagnosis not present

## 2019-05-19 DIAGNOSIS — N186 End stage renal disease: Secondary | ICD-10-CM | POA: Diagnosis not present

## 2019-05-19 DIAGNOSIS — D631 Anemia in chronic kidney disease: Secondary | ICD-10-CM | POA: Diagnosis not present

## 2019-05-19 DIAGNOSIS — N2581 Secondary hyperparathyroidism of renal origin: Secondary | ICD-10-CM | POA: Diagnosis not present

## 2019-05-19 DIAGNOSIS — D509 Iron deficiency anemia, unspecified: Secondary | ICD-10-CM | POA: Diagnosis not present

## 2019-05-20 ENCOUNTER — Telehealth: Payer: Self-pay | Admitting: Internal Medicine

## 2019-05-20 ENCOUNTER — Ambulatory Visit: Payer: Medicare Other | Admitting: Internal Medicine

## 2019-05-21 DIAGNOSIS — D509 Iron deficiency anemia, unspecified: Secondary | ICD-10-CM | POA: Diagnosis not present

## 2019-05-21 DIAGNOSIS — N2581 Secondary hyperparathyroidism of renal origin: Secondary | ICD-10-CM | POA: Diagnosis not present

## 2019-05-21 DIAGNOSIS — N186 End stage renal disease: Secondary | ICD-10-CM | POA: Diagnosis not present

## 2019-05-21 DIAGNOSIS — D631 Anemia in chronic kidney disease: Secondary | ICD-10-CM | POA: Diagnosis not present

## 2019-05-23 DIAGNOSIS — D631 Anemia in chronic kidney disease: Secondary | ICD-10-CM | POA: Diagnosis not present

## 2019-05-23 DIAGNOSIS — N186 End stage renal disease: Secondary | ICD-10-CM | POA: Diagnosis not present

## 2019-05-23 DIAGNOSIS — D509 Iron deficiency anemia, unspecified: Secondary | ICD-10-CM | POA: Diagnosis not present

## 2019-05-23 DIAGNOSIS — N2581 Secondary hyperparathyroidism of renal origin: Secondary | ICD-10-CM | POA: Diagnosis not present

## 2019-05-24 DIAGNOSIS — I158 Other secondary hypertension: Secondary | ICD-10-CM | POA: Diagnosis not present

## 2019-05-24 DIAGNOSIS — Z992 Dependence on renal dialysis: Secondary | ICD-10-CM | POA: Diagnosis not present

## 2019-05-24 DIAGNOSIS — N186 End stage renal disease: Secondary | ICD-10-CM | POA: Diagnosis not present

## 2019-05-26 DIAGNOSIS — D509 Iron deficiency anemia, unspecified: Secondary | ICD-10-CM | POA: Diagnosis not present

## 2019-05-26 DIAGNOSIS — N2581 Secondary hyperparathyroidism of renal origin: Secondary | ICD-10-CM | POA: Diagnosis not present

## 2019-05-26 DIAGNOSIS — Z992 Dependence on renal dialysis: Secondary | ICD-10-CM | POA: Diagnosis not present

## 2019-05-26 DIAGNOSIS — N186 End stage renal disease: Secondary | ICD-10-CM | POA: Diagnosis not present

## 2019-05-26 DIAGNOSIS — D631 Anemia in chronic kidney disease: Secondary | ICD-10-CM | POA: Diagnosis not present

## 2019-05-27 ENCOUNTER — Ambulatory Visit: Payer: Medicare Other | Admitting: Internal Medicine

## 2019-05-28 DIAGNOSIS — D631 Anemia in chronic kidney disease: Secondary | ICD-10-CM | POA: Diagnosis not present

## 2019-05-28 DIAGNOSIS — N2581 Secondary hyperparathyroidism of renal origin: Secondary | ICD-10-CM | POA: Diagnosis not present

## 2019-05-28 DIAGNOSIS — D509 Iron deficiency anemia, unspecified: Secondary | ICD-10-CM | POA: Diagnosis not present

## 2019-05-28 DIAGNOSIS — Z992 Dependence on renal dialysis: Secondary | ICD-10-CM | POA: Diagnosis not present

## 2019-05-28 DIAGNOSIS — N186 End stage renal disease: Secondary | ICD-10-CM | POA: Diagnosis not present

## 2019-05-30 DIAGNOSIS — Z992 Dependence on renal dialysis: Secondary | ICD-10-CM | POA: Diagnosis not present

## 2019-05-30 DIAGNOSIS — D509 Iron deficiency anemia, unspecified: Secondary | ICD-10-CM | POA: Diagnosis not present

## 2019-05-30 DIAGNOSIS — D631 Anemia in chronic kidney disease: Secondary | ICD-10-CM | POA: Diagnosis not present

## 2019-05-30 DIAGNOSIS — N186 End stage renal disease: Secondary | ICD-10-CM | POA: Diagnosis not present

## 2019-05-30 DIAGNOSIS — N2581 Secondary hyperparathyroidism of renal origin: Secondary | ICD-10-CM | POA: Diagnosis not present

## 2019-06-02 DIAGNOSIS — Z992 Dependence on renal dialysis: Secondary | ICD-10-CM | POA: Diagnosis not present

## 2019-06-02 DIAGNOSIS — D509 Iron deficiency anemia, unspecified: Secondary | ICD-10-CM | POA: Diagnosis not present

## 2019-06-02 DIAGNOSIS — N2581 Secondary hyperparathyroidism of renal origin: Secondary | ICD-10-CM | POA: Diagnosis not present

## 2019-06-02 DIAGNOSIS — D631 Anemia in chronic kidney disease: Secondary | ICD-10-CM | POA: Diagnosis not present

## 2019-06-02 DIAGNOSIS — N186 End stage renal disease: Secondary | ICD-10-CM | POA: Diagnosis not present

## 2019-06-04 DIAGNOSIS — N186 End stage renal disease: Secondary | ICD-10-CM | POA: Diagnosis not present

## 2019-06-04 DIAGNOSIS — Z992 Dependence on renal dialysis: Secondary | ICD-10-CM | POA: Diagnosis not present

## 2019-06-04 DIAGNOSIS — D631 Anemia in chronic kidney disease: Secondary | ICD-10-CM | POA: Diagnosis not present

## 2019-06-04 DIAGNOSIS — N2581 Secondary hyperparathyroidism of renal origin: Secondary | ICD-10-CM | POA: Diagnosis not present

## 2019-06-04 DIAGNOSIS — D509 Iron deficiency anemia, unspecified: Secondary | ICD-10-CM | POA: Diagnosis not present

## 2019-06-06 DIAGNOSIS — D509 Iron deficiency anemia, unspecified: Secondary | ICD-10-CM | POA: Diagnosis not present

## 2019-06-06 DIAGNOSIS — N2581 Secondary hyperparathyroidism of renal origin: Secondary | ICD-10-CM | POA: Diagnosis not present

## 2019-06-06 DIAGNOSIS — N186 End stage renal disease: Secondary | ICD-10-CM | POA: Diagnosis not present

## 2019-06-06 DIAGNOSIS — Z992 Dependence on renal dialysis: Secondary | ICD-10-CM | POA: Diagnosis not present

## 2019-06-06 DIAGNOSIS — D631 Anemia in chronic kidney disease: Secondary | ICD-10-CM | POA: Diagnosis not present

## 2019-06-09 DIAGNOSIS — N186 End stage renal disease: Secondary | ICD-10-CM | POA: Diagnosis not present

## 2019-06-09 DIAGNOSIS — D509 Iron deficiency anemia, unspecified: Secondary | ICD-10-CM | POA: Diagnosis not present

## 2019-06-09 DIAGNOSIS — D631 Anemia in chronic kidney disease: Secondary | ICD-10-CM | POA: Diagnosis not present

## 2019-06-09 DIAGNOSIS — Z992 Dependence on renal dialysis: Secondary | ICD-10-CM | POA: Diagnosis not present

## 2019-06-09 DIAGNOSIS — N2581 Secondary hyperparathyroidism of renal origin: Secondary | ICD-10-CM | POA: Diagnosis not present

## 2019-06-11 DIAGNOSIS — Z992 Dependence on renal dialysis: Secondary | ICD-10-CM | POA: Diagnosis not present

## 2019-06-11 DIAGNOSIS — D631 Anemia in chronic kidney disease: Secondary | ICD-10-CM | POA: Diagnosis not present

## 2019-06-11 DIAGNOSIS — D509 Iron deficiency anemia, unspecified: Secondary | ICD-10-CM | POA: Diagnosis not present

## 2019-06-11 DIAGNOSIS — N186 End stage renal disease: Secondary | ICD-10-CM | POA: Diagnosis not present

## 2019-06-11 DIAGNOSIS — N2581 Secondary hyperparathyroidism of renal origin: Secondary | ICD-10-CM | POA: Diagnosis not present

## 2019-06-13 DIAGNOSIS — Z992 Dependence on renal dialysis: Secondary | ICD-10-CM | POA: Diagnosis not present

## 2019-06-13 DIAGNOSIS — D631 Anemia in chronic kidney disease: Secondary | ICD-10-CM | POA: Diagnosis not present

## 2019-06-13 DIAGNOSIS — D509 Iron deficiency anemia, unspecified: Secondary | ICD-10-CM | POA: Diagnosis not present

## 2019-06-13 DIAGNOSIS — N186 End stage renal disease: Secondary | ICD-10-CM | POA: Diagnosis not present

## 2019-06-13 DIAGNOSIS — N2581 Secondary hyperparathyroidism of renal origin: Secondary | ICD-10-CM | POA: Diagnosis not present

## 2019-06-16 DIAGNOSIS — D631 Anemia in chronic kidney disease: Secondary | ICD-10-CM | POA: Diagnosis not present

## 2019-06-16 DIAGNOSIS — Z992 Dependence on renal dialysis: Secondary | ICD-10-CM | POA: Diagnosis not present

## 2019-06-16 DIAGNOSIS — N2581 Secondary hyperparathyroidism of renal origin: Secondary | ICD-10-CM | POA: Diagnosis not present

## 2019-06-16 DIAGNOSIS — D509 Iron deficiency anemia, unspecified: Secondary | ICD-10-CM | POA: Diagnosis not present

## 2019-06-16 DIAGNOSIS — N186 End stage renal disease: Secondary | ICD-10-CM | POA: Diagnosis not present

## 2019-06-18 DIAGNOSIS — N2581 Secondary hyperparathyroidism of renal origin: Secondary | ICD-10-CM | POA: Diagnosis not present

## 2019-06-18 DIAGNOSIS — D509 Iron deficiency anemia, unspecified: Secondary | ICD-10-CM | POA: Diagnosis not present

## 2019-06-18 DIAGNOSIS — N186 End stage renal disease: Secondary | ICD-10-CM | POA: Diagnosis not present

## 2019-06-18 DIAGNOSIS — Z992 Dependence on renal dialysis: Secondary | ICD-10-CM | POA: Diagnosis not present

## 2019-06-18 DIAGNOSIS — D631 Anemia in chronic kidney disease: Secondary | ICD-10-CM | POA: Diagnosis not present

## 2019-06-20 DIAGNOSIS — N898 Other specified noninflammatory disorders of vagina: Secondary | ICD-10-CM | POA: Diagnosis not present

## 2019-06-20 DIAGNOSIS — N870 Mild cervical dysplasia: Secondary | ICD-10-CM | POA: Diagnosis not present

## 2019-06-20 DIAGNOSIS — Z202 Contact with and (suspected) exposure to infections with a predominantly sexual mode of transmission: Secondary | ICD-10-CM | POA: Diagnosis not present

## 2019-06-20 DIAGNOSIS — Z992 Dependence on renal dialysis: Secondary | ICD-10-CM | POA: Diagnosis not present

## 2019-06-20 DIAGNOSIS — N83201 Unspecified ovarian cyst, right side: Secondary | ICD-10-CM | POA: Diagnosis not present

## 2019-06-20 DIAGNOSIS — N2581 Secondary hyperparathyroidism of renal origin: Secondary | ICD-10-CM | POA: Diagnosis not present

## 2019-06-20 DIAGNOSIS — D631 Anemia in chronic kidney disease: Secondary | ICD-10-CM | POA: Diagnosis not present

## 2019-06-20 DIAGNOSIS — D509 Iron deficiency anemia, unspecified: Secondary | ICD-10-CM | POA: Diagnosis not present

## 2019-06-20 DIAGNOSIS — D638 Anemia in other chronic diseases classified elsewhere: Secondary | ICD-10-CM | POA: Diagnosis not present

## 2019-06-20 DIAGNOSIS — R1909 Other intra-abdominal and pelvic swelling, mass and lump: Secondary | ICD-10-CM | POA: Diagnosis not present

## 2019-06-20 DIAGNOSIS — N186 End stage renal disease: Secondary | ICD-10-CM | POA: Diagnosis not present

## 2019-06-23 DIAGNOSIS — N186 End stage renal disease: Secondary | ICD-10-CM | POA: Diagnosis not present

## 2019-06-23 DIAGNOSIS — Z992 Dependence on renal dialysis: Secondary | ICD-10-CM | POA: Diagnosis not present

## 2019-06-23 DIAGNOSIS — D509 Iron deficiency anemia, unspecified: Secondary | ICD-10-CM | POA: Diagnosis not present

## 2019-06-23 DIAGNOSIS — D631 Anemia in chronic kidney disease: Secondary | ICD-10-CM | POA: Diagnosis not present

## 2019-06-23 DIAGNOSIS — N2581 Secondary hyperparathyroidism of renal origin: Secondary | ICD-10-CM | POA: Diagnosis not present

## 2019-06-24 ENCOUNTER — Other Ambulatory Visit: Payer: Self-pay

## 2019-06-24 ENCOUNTER — Ambulatory Visit (INDEPENDENT_AMBULATORY_CARE_PROVIDER_SITE_OTHER): Payer: Medicare Other | Admitting: Vascular Surgery

## 2019-06-24 ENCOUNTER — Encounter: Payer: Self-pay | Admitting: Vascular Surgery

## 2019-06-24 VITALS — BP 95/70 | HR 86 | Temp 97.3°F | Resp 18 | Ht 67.0 in | Wt 193.0 lb

## 2019-06-24 DIAGNOSIS — N186 End stage renal disease: Secondary | ICD-10-CM

## 2019-06-24 DIAGNOSIS — Z992 Dependence on renal dialysis: Secondary | ICD-10-CM | POA: Diagnosis not present

## 2019-06-24 DIAGNOSIS — I158 Other secondary hypertension: Secondary | ICD-10-CM | POA: Diagnosis not present

## 2019-06-24 NOTE — Telephone Encounter (Signed)
Patient did not want to keep appointment for PFT and OV.  She did not want to give a reason.   Routing to MW as Juluis Rainier.

## 2019-06-24 NOTE — Progress Notes (Signed)
[facility] [place]  Patient name: [name] [name] MRN: [id number] DOB: January 04, 1981 Sex: female  REASON FOR CONSULT: Evaluation of skin changes over right brachiocephalic fistula  HPI: [name] [name] is a 38 y.o. female, who is here today for evaluation.  She had had initially placement of a right brachiocephalic fistula with Dr. Scot Dock on 05/23/2016.  She is required no surgical revision since they initial placement.  She does report she has had no vascular treatment at [facility].  She is seen today for concern of some superficial ulceration over the fistula in her upper arm.  Denies any bleeding from this.  She has had ability to have access of her fistula  Past Medical History:  Diagnosis Date  . CHF (congestive heart failure) ([place]) 5-6 yrs ago  hosp  . Chronic kidney disease   . ERYTHEMATOSUS, LUPUS 08/07/2006  . Hypertension   . Intestinal infection due to Clostridium difficile   . Secondary cardiomyopathy, unspecified   . Sleep apnea    does not wear CPAP  . Systemic lupus erythematosus ([place])   . TIA (transient ischemic attack)   . Unspecified deficiency anemia   . Unspecified essential hypertension     Family History  Problem Relation Age of Onset  . Lupus Mother   . Cancer Maternal Grandmother        unknown  . Prostate cancer Paternal Grandfather   . Hypertension Father   . Diabetes Brother   . Diabetes Other        mat cousin    SOCIAL HISTORY: Social History   Socioeconomic History  . Marital status: Single    Spouse name: Not on file  . Number of children: 0  . Years of education: Not on file  . Highest education level: Not on file  Occupational History  . Occupation: Education administrator   . Occupation: Scientist, clinical (histocompatibility and immunogenetics): [employer]  Social Needs  . Financial resource strain: Not on file  . Food insecurity    Worry: Not on file    Inability: Not on file  . Transportation needs     Medical: Not on file    Non-medical: Not on file  Tobacco Use  . Smoking status: Never Smoker  . Smokeless tobacco: Never Used  Substance and Sexual Activity  . Alcohol use: No    Alcohol/week: 0.0 standard drinks  . Drug use: No  . Sexual activity: Never  Lifestyle  . Physical activity    Days per week: Not on file    Minutes per session: Not on file  . Stress: Not on file  Relationships  . Social Herbalist on phone: Not on file    Gets together: Not on file    Attends religious service: Not on file    Active member of club or organization: Not on file    Attends meetings of clubs or organizations: Not on file    Relationship status: Not on file  . Intimate partner violence    Fear of current or ex partner: Not on file    Emotionally abused: Not on file    Physically abused: Not on file    Forced sexual activity: Not on file  Other Topics Concern  . Not on file  Social History Narrative   Lives with sister.        Allergies  Allergen Reactions  . Lisinopril Swelling    SWELLING OF  TOP LIP  . Nsaids Other (See Comments)    MD TOLD PATIENT TO AVOID NSAIDS DUE TO RENAL INSUFFICIENY.    Current Outpatient Medications  Medication Sig Dispense Refill  . amLODipine (NORVASC) 10 MG tablet Take 10 mg by mouth every evening.   4  . b complex-vitamin c-folic acid (NEPHRO-VITE) 0.8 MG TABS tablet Take 1 tablet by mouth daily.    . carvedilol (COREG) 25 MG tablet Take 25 mg by mouth 2 (two) times daily.   9  . cinacalcet (SENSIPAR) 30 MG tablet TAKE 1 TABLET BY MOUTH DAILY WITH DINNER (DO NOT TAKE LESS THAN 12 HOURS PRIOR TO DIALYSIS)    . cloNIDine (CATAPRES) 0.1 MG tablet Take 0.1 mg by mouth 2 (two) times daily.  0  . ferric citrate (AURYXIA) 1 GM 210 MG(Fe) tablet Auryxia 210 mg iron tablet    . glycopyrrolate (ROBINUL) 2 MG tablet Take 1 tablet (2 mg total) by mouth 2 (two) times daily. 60 tablet 11  . hydroxychloroquine (PLAQUENIL) 200 MG tablet TAKE 1  TABLET BY MOUTH TWICE A DAY (Patient taking differently: Take 100-200 mg by mouth See admin instructions. Take 1/2 tablet (100 mg) by mouth daily in the morning & take 1 tablet (200 mg) by mouth at night.) 60 tablet 1  . lidocaine-prilocaine (EMLA) cream Apply 1 application topically daily as needed. 30 g 0  . losartan (COZAAR) 100 MG tablet Take 100 mg by mouth every evening.   11  . macitentan (OPSUMIT) 10 MG tablet Take 10 mg by mouth daily.    . ondansetron (ZOFRAN-ODT) 4 MG disintegrating tablet Take 1 tablet (4 mg total) by mouth every 8 (eight) hours as needed for nausea or vomiting. 30 tablet 2  . pantoprazole (PROTONIX) 40 MG tablet TAKE 1 TABLET BY MOUTH EVERY DAY 90 tablet 0  . predniSONE (DELTASONE) 10 MG tablet Take 10 mg by mouth at bedtime.   12  . sucroferric oxyhydroxide (VELPHORO) 500 MG chewable tablet Velphoro 500 mg chewable tablet    . triamcinolone ointment (KENALOG) 0.1 % Apply 1 application topically daily as needed (FOR RASH/SKIN IRRITATION.).     No current facility-administered medications for this visit.     REVIEW OF SYSTEMS:  [X]  denotes positive finding, [ ]  denotes negative finding Cardiac  Comments:  Chest pain or chest pressure:    Shortness of breath upon exertion: x   Short of breath when lying flat:    Irregular heart rhythm:        Vascular    Pain in calf, thigh, or hip brought on by ambulation:    Pain in feet at night that wakes you up from your sleep:     Blood clot in your veins:    Leg swelling:         Pulmonary    Oxygen at home:    Productive cough:     Wheezing:         Neurologic    Sudden weakness in arms or legs:     Sudden numbness in arms or legs:     Sudden onset of difficulty speaking or slurred speech:    Temporary loss of vision in one eye:     Problems with dizziness:         Gastrointestinal    Blood in stool:     Vomited blood:         Genitourinary    Burning when urinating:     Blood in urine:  Psychiatric    Major depression:         Hematologic    Bleeding problems:    Problems with blood clotting too easily:        Skin    Rashes or ulcers:        Constitutional    Fever or chills:      PHYSICAL EXAM: Vitals:   06/24/19 1044  BP: 95/70  Pulse: 86  Resp: 18  Temp: (!) 97.3 F (36.3 C)  SpO2: 100%  Weight: 87.5 kg  Height: 5\' 7"  (1.702 m)    GENERAL: The patient is a well-nourished female, in no acute distress. The vital signs are documented above. CARDIOVASCULAR: Palpable right radial pulse.  Venous aneurysmal change of right upper arm AV fistula.  The more proximal area of the access site does have some superficial skin breakdown and blistering.  This does not appear to go full-thickness.  The access site around this does not have any evidence of skin breakdown. PULMONARY: There is good air exchange  ABDOMEN: Soft and non-tender  MUSCULOSKELETAL: There are no major deformities or cyanosis. NEUROLOGIC: No focal weakness or paresthesias are detected. SKIN: There are no ulcers or rashes noted. PSYCHIATRIC: The patient has a normal affect.  DATA:  None  MEDICAL ISSUES: Had long discussion with the patient.  I explained that she currently does not have any evidence of full-thickness skin breakdown and would feel comfortable with continued observation.  She does not report any significant pain associated with her access.  She reports that the technicians are able to obtain needle access without difficulty.  I explained that if she is having increasing pain, difficulty for access or more skin breakdown would recommend resection of this area and plication of the vein.  She understands and wishes continued observation at this time   Rosetta Posner, MD [facility] Tel [phone] Pager [phone]

## 2019-06-25 DIAGNOSIS — Z23 Encounter for immunization: Secondary | ICD-10-CM | POA: Diagnosis not present

## 2019-06-25 DIAGNOSIS — D509 Iron deficiency anemia, unspecified: Secondary | ICD-10-CM | POA: Diagnosis not present

## 2019-06-25 DIAGNOSIS — Z992 Dependence on renal dialysis: Secondary | ICD-10-CM | POA: Diagnosis not present

## 2019-06-25 DIAGNOSIS — N2581 Secondary hyperparathyroidism of renal origin: Secondary | ICD-10-CM | POA: Diagnosis not present

## 2019-06-25 DIAGNOSIS — N186 End stage renal disease: Secondary | ICD-10-CM | POA: Diagnosis not present

## 2019-06-25 DIAGNOSIS — D631 Anemia in chronic kidney disease: Secondary | ICD-10-CM | POA: Diagnosis not present

## 2019-06-27 DIAGNOSIS — D631 Anemia in chronic kidney disease: Secondary | ICD-10-CM | POA: Diagnosis not present

## 2019-06-27 DIAGNOSIS — N186 End stage renal disease: Secondary | ICD-10-CM | POA: Diagnosis not present

## 2019-06-27 DIAGNOSIS — Z23 Encounter for immunization: Secondary | ICD-10-CM | POA: Diagnosis not present

## 2019-06-27 DIAGNOSIS — Z992 Dependence on renal dialysis: Secondary | ICD-10-CM | POA: Diagnosis not present

## 2019-06-27 DIAGNOSIS — N2581 Secondary hyperparathyroidism of renal origin: Secondary | ICD-10-CM | POA: Diagnosis not present

## 2019-06-27 DIAGNOSIS — D509 Iron deficiency anemia, unspecified: Secondary | ICD-10-CM | POA: Diagnosis not present

## 2019-06-30 DIAGNOSIS — D509 Iron deficiency anemia, unspecified: Secondary | ICD-10-CM | POA: Diagnosis not present

## 2019-06-30 DIAGNOSIS — Z992 Dependence on renal dialysis: Secondary | ICD-10-CM | POA: Diagnosis not present

## 2019-06-30 DIAGNOSIS — N186 End stage renal disease: Secondary | ICD-10-CM | POA: Diagnosis not present

## 2019-06-30 DIAGNOSIS — D631 Anemia in chronic kidney disease: Secondary | ICD-10-CM | POA: Diagnosis not present

## 2019-06-30 DIAGNOSIS — N2581 Secondary hyperparathyroidism of renal origin: Secondary | ICD-10-CM | POA: Diagnosis not present

## 2019-06-30 DIAGNOSIS — Z23 Encounter for immunization: Secondary | ICD-10-CM | POA: Diagnosis not present

## 2019-07-02 DIAGNOSIS — N2581 Secondary hyperparathyroidism of renal origin: Secondary | ICD-10-CM | POA: Diagnosis not present

## 2019-07-02 DIAGNOSIS — D631 Anemia in chronic kidney disease: Secondary | ICD-10-CM | POA: Diagnosis not present

## 2019-07-02 DIAGNOSIS — N186 End stage renal disease: Secondary | ICD-10-CM | POA: Diagnosis not present

## 2019-07-02 DIAGNOSIS — Z23 Encounter for immunization: Secondary | ICD-10-CM | POA: Diagnosis not present

## 2019-07-02 DIAGNOSIS — Z992 Dependence on renal dialysis: Secondary | ICD-10-CM | POA: Diagnosis not present

## 2019-07-02 DIAGNOSIS — D509 Iron deficiency anemia, unspecified: Secondary | ICD-10-CM | POA: Diagnosis not present

## 2019-07-04 DIAGNOSIS — Z992 Dependence on renal dialysis: Secondary | ICD-10-CM | POA: Diagnosis not present

## 2019-07-04 DIAGNOSIS — D509 Iron deficiency anemia, unspecified: Secondary | ICD-10-CM | POA: Diagnosis not present

## 2019-07-04 DIAGNOSIS — N186 End stage renal disease: Secondary | ICD-10-CM | POA: Diagnosis not present

## 2019-07-04 DIAGNOSIS — N2581 Secondary hyperparathyroidism of renal origin: Secondary | ICD-10-CM | POA: Diagnosis not present

## 2019-07-04 DIAGNOSIS — D631 Anemia in chronic kidney disease: Secondary | ICD-10-CM | POA: Diagnosis not present

## 2019-07-04 DIAGNOSIS — Z23 Encounter for immunization: Secondary | ICD-10-CM | POA: Diagnosis not present

## 2019-07-07 ENCOUNTER — Other Ambulatory Visit: Payer: Self-pay

## 2019-07-07 DIAGNOSIS — D631 Anemia in chronic kidney disease: Secondary | ICD-10-CM | POA: Diagnosis not present

## 2019-07-07 DIAGNOSIS — N2581 Secondary hyperparathyroidism of renal origin: Secondary | ICD-10-CM | POA: Diagnosis not present

## 2019-07-07 DIAGNOSIS — Z23 Encounter for immunization: Secondary | ICD-10-CM | POA: Diagnosis not present

## 2019-07-07 DIAGNOSIS — Z992 Dependence on renal dialysis: Secondary | ICD-10-CM | POA: Diagnosis not present

## 2019-07-07 DIAGNOSIS — N186 End stage renal disease: Secondary | ICD-10-CM | POA: Diagnosis not present

## 2019-07-07 DIAGNOSIS — D509 Iron deficiency anemia, unspecified: Secondary | ICD-10-CM | POA: Diagnosis not present

## 2019-07-07 MED ORDER — PANTOPRAZOLE SODIUM 40 MG PO TBEC: 40.0000 mg | DELAYED_RELEASE_TABLET | Freq: Every day | ORAL | 1 refills | Status: DC

## 2019-07-09 DIAGNOSIS — D631 Anemia in chronic kidney disease: Secondary | ICD-10-CM | POA: Diagnosis not present

## 2019-07-09 DIAGNOSIS — Z23 Encounter for immunization: Secondary | ICD-10-CM | POA: Diagnosis not present

## 2019-07-09 DIAGNOSIS — D509 Iron deficiency anemia, unspecified: Secondary | ICD-10-CM | POA: Diagnosis not present

## 2019-07-09 DIAGNOSIS — N186 End stage renal disease: Secondary | ICD-10-CM | POA: Diagnosis not present

## 2019-07-09 DIAGNOSIS — Z992 Dependence on renal dialysis: Secondary | ICD-10-CM | POA: Diagnosis not present

## 2019-07-09 DIAGNOSIS — N2581 Secondary hyperparathyroidism of renal origin: Secondary | ICD-10-CM | POA: Diagnosis not present

## 2019-07-11 DIAGNOSIS — N2581 Secondary hyperparathyroidism of renal origin: Secondary | ICD-10-CM | POA: Diagnosis not present

## 2019-07-11 DIAGNOSIS — Z992 Dependence on renal dialysis: Secondary | ICD-10-CM | POA: Diagnosis not present

## 2019-07-11 DIAGNOSIS — D509 Iron deficiency anemia, unspecified: Secondary | ICD-10-CM | POA: Diagnosis not present

## 2019-07-11 DIAGNOSIS — N186 End stage renal disease: Secondary | ICD-10-CM | POA: Diagnosis not present

## 2019-07-11 DIAGNOSIS — Z23 Encounter for immunization: Secondary | ICD-10-CM | POA: Diagnosis not present

## 2019-07-11 DIAGNOSIS — D631 Anemia in chronic kidney disease: Secondary | ICD-10-CM | POA: Diagnosis not present

## 2019-07-14 DIAGNOSIS — D509 Iron deficiency anemia, unspecified: Secondary | ICD-10-CM | POA: Diagnosis not present

## 2019-07-14 DIAGNOSIS — D631 Anemia in chronic kidney disease: Secondary | ICD-10-CM | POA: Diagnosis not present

## 2019-07-14 DIAGNOSIS — N186 End stage renal disease: Secondary | ICD-10-CM | POA: Diagnosis not present

## 2019-07-14 DIAGNOSIS — N2581 Secondary hyperparathyroidism of renal origin: Secondary | ICD-10-CM | POA: Diagnosis not present

## 2019-07-14 DIAGNOSIS — Z992 Dependence on renal dialysis: Secondary | ICD-10-CM | POA: Diagnosis not present

## 2019-07-14 DIAGNOSIS — Z23 Encounter for immunization: Secondary | ICD-10-CM | POA: Diagnosis not present

## 2019-07-16 DIAGNOSIS — N2581 Secondary hyperparathyroidism of renal origin: Secondary | ICD-10-CM | POA: Diagnosis not present

## 2019-07-16 DIAGNOSIS — D631 Anemia in chronic kidney disease: Secondary | ICD-10-CM | POA: Diagnosis not present

## 2019-07-16 DIAGNOSIS — N186 End stage renal disease: Secondary | ICD-10-CM | POA: Diagnosis not present

## 2019-07-16 DIAGNOSIS — Z23 Encounter for immunization: Secondary | ICD-10-CM | POA: Diagnosis not present

## 2019-07-16 DIAGNOSIS — Z992 Dependence on renal dialysis: Secondary | ICD-10-CM | POA: Diagnosis not present

## 2019-07-16 DIAGNOSIS — D509 Iron deficiency anemia, unspecified: Secondary | ICD-10-CM | POA: Diagnosis not present

## 2019-07-18 DIAGNOSIS — Z23 Encounter for immunization: Secondary | ICD-10-CM | POA: Diagnosis not present

## 2019-07-18 DIAGNOSIS — D631 Anemia in chronic kidney disease: Secondary | ICD-10-CM | POA: Diagnosis not present

## 2019-07-18 DIAGNOSIS — D509 Iron deficiency anemia, unspecified: Secondary | ICD-10-CM | POA: Diagnosis not present

## 2019-07-18 DIAGNOSIS — N2581 Secondary hyperparathyroidism of renal origin: Secondary | ICD-10-CM | POA: Diagnosis not present

## 2019-07-18 DIAGNOSIS — N186 End stage renal disease: Secondary | ICD-10-CM | POA: Diagnosis not present

## 2019-07-18 DIAGNOSIS — Z992 Dependence on renal dialysis: Secondary | ICD-10-CM | POA: Diagnosis not present

## 2019-07-21 ENCOUNTER — Encounter: Payer: Self-pay | Admitting: Family Medicine

## 2019-07-21 DIAGNOSIS — Z23 Encounter for immunization: Secondary | ICD-10-CM | POA: Diagnosis not present

## 2019-07-21 DIAGNOSIS — D509 Iron deficiency anemia, unspecified: Secondary | ICD-10-CM | POA: Diagnosis not present

## 2019-07-21 DIAGNOSIS — D631 Anemia in chronic kidney disease: Secondary | ICD-10-CM | POA: Diagnosis not present

## 2019-07-21 DIAGNOSIS — N2581 Secondary hyperparathyroidism of renal origin: Secondary | ICD-10-CM | POA: Diagnosis not present

## 2019-07-21 DIAGNOSIS — Z992 Dependence on renal dialysis: Secondary | ICD-10-CM | POA: Diagnosis not present

## 2019-07-21 DIAGNOSIS — N186 End stage renal disease: Secondary | ICD-10-CM | POA: Diagnosis not present

## 2019-07-23 DIAGNOSIS — N2581 Secondary hyperparathyroidism of renal origin: Secondary | ICD-10-CM | POA: Diagnosis not present

## 2019-07-23 DIAGNOSIS — Z23 Encounter for immunization: Secondary | ICD-10-CM | POA: Diagnosis not present

## 2019-07-23 DIAGNOSIS — D509 Iron deficiency anemia, unspecified: Secondary | ICD-10-CM | POA: Diagnosis not present

## 2019-07-23 DIAGNOSIS — N186 End stage renal disease: Secondary | ICD-10-CM | POA: Diagnosis not present

## 2019-07-23 DIAGNOSIS — Z992 Dependence on renal dialysis: Secondary | ICD-10-CM | POA: Diagnosis not present

## 2019-07-23 DIAGNOSIS — D631 Anemia in chronic kidney disease: Secondary | ICD-10-CM | POA: Diagnosis not present

## 2019-07-24 DIAGNOSIS — Z992 Dependence on renal dialysis: Secondary | ICD-10-CM | POA: Diagnosis not present

## 2019-07-24 DIAGNOSIS — N186 End stage renal disease: Secondary | ICD-10-CM | POA: Diagnosis not present

## 2019-07-24 DIAGNOSIS — I158 Other secondary hypertension: Secondary | ICD-10-CM | POA: Diagnosis not present

## 2019-07-25 DIAGNOSIS — D631 Anemia in chronic kidney disease: Secondary | ICD-10-CM | POA: Diagnosis not present

## 2019-07-25 DIAGNOSIS — N186 End stage renal disease: Secondary | ICD-10-CM | POA: Diagnosis not present

## 2019-07-25 DIAGNOSIS — N2581 Secondary hyperparathyroidism of renal origin: Secondary | ICD-10-CM | POA: Diagnosis not present

## 2019-07-25 DIAGNOSIS — D509 Iron deficiency anemia, unspecified: Secondary | ICD-10-CM | POA: Diagnosis not present

## 2019-07-25 DIAGNOSIS — Z992 Dependence on renal dialysis: Secondary | ICD-10-CM | POA: Diagnosis not present

## 2019-07-28 DIAGNOSIS — N2581 Secondary hyperparathyroidism of renal origin: Secondary | ICD-10-CM | POA: Diagnosis not present

## 2019-07-28 DIAGNOSIS — D631 Anemia in chronic kidney disease: Secondary | ICD-10-CM | POA: Diagnosis not present

## 2019-07-28 DIAGNOSIS — D509 Iron deficiency anemia, unspecified: Secondary | ICD-10-CM | POA: Diagnosis not present

## 2019-07-28 DIAGNOSIS — N186 End stage renal disease: Secondary | ICD-10-CM | POA: Diagnosis not present

## 2019-07-28 DIAGNOSIS — Z992 Dependence on renal dialysis: Secondary | ICD-10-CM | POA: Diagnosis not present

## 2019-07-30 DIAGNOSIS — N2581 Secondary hyperparathyroidism of renal origin: Secondary | ICD-10-CM | POA: Diagnosis not present

## 2019-07-30 DIAGNOSIS — Z992 Dependence on renal dialysis: Secondary | ICD-10-CM | POA: Diagnosis not present

## 2019-07-30 DIAGNOSIS — D509 Iron deficiency anemia, unspecified: Secondary | ICD-10-CM | POA: Diagnosis not present

## 2019-07-30 DIAGNOSIS — N186 End stage renal disease: Secondary | ICD-10-CM | POA: Diagnosis not present

## 2019-07-30 DIAGNOSIS — D631 Anemia in chronic kidney disease: Secondary | ICD-10-CM | POA: Diagnosis not present

## 2019-08-01 DIAGNOSIS — N2581 Secondary hyperparathyroidism of renal origin: Secondary | ICD-10-CM | POA: Diagnosis not present

## 2019-08-01 DIAGNOSIS — Z992 Dependence on renal dialysis: Secondary | ICD-10-CM | POA: Diagnosis not present

## 2019-08-01 DIAGNOSIS — D509 Iron deficiency anemia, unspecified: Secondary | ICD-10-CM | POA: Diagnosis not present

## 2019-08-01 DIAGNOSIS — D631 Anemia in chronic kidney disease: Secondary | ICD-10-CM | POA: Diagnosis not present

## 2019-08-01 DIAGNOSIS — N186 End stage renal disease: Secondary | ICD-10-CM | POA: Diagnosis not present

## 2019-08-04 DIAGNOSIS — N186 End stage renal disease: Secondary | ICD-10-CM | POA: Diagnosis not present

## 2019-08-04 DIAGNOSIS — D631 Anemia in chronic kidney disease: Secondary | ICD-10-CM | POA: Diagnosis not present

## 2019-08-04 DIAGNOSIS — N2581 Secondary hyperparathyroidism of renal origin: Secondary | ICD-10-CM | POA: Diagnosis not present

## 2019-08-04 DIAGNOSIS — Z992 Dependence on renal dialysis: Secondary | ICD-10-CM | POA: Diagnosis not present

## 2019-08-04 DIAGNOSIS — D509 Iron deficiency anemia, unspecified: Secondary | ICD-10-CM | POA: Diagnosis not present

## 2019-08-06 DIAGNOSIS — N2581 Secondary hyperparathyroidism of renal origin: Secondary | ICD-10-CM | POA: Diagnosis not present

## 2019-08-06 DIAGNOSIS — N186 End stage renal disease: Secondary | ICD-10-CM | POA: Diagnosis not present

## 2019-08-06 DIAGNOSIS — D631 Anemia in chronic kidney disease: Secondary | ICD-10-CM | POA: Diagnosis not present

## 2019-08-06 DIAGNOSIS — Z992 Dependence on renal dialysis: Secondary | ICD-10-CM | POA: Diagnosis not present

## 2019-08-06 DIAGNOSIS — D509 Iron deficiency anemia, unspecified: Secondary | ICD-10-CM | POA: Diagnosis not present

## 2019-08-08 DIAGNOSIS — N2581 Secondary hyperparathyroidism of renal origin: Secondary | ICD-10-CM | POA: Diagnosis not present

## 2019-08-08 DIAGNOSIS — Z992 Dependence on renal dialysis: Secondary | ICD-10-CM | POA: Diagnosis not present

## 2019-08-08 DIAGNOSIS — D631 Anemia in chronic kidney disease: Secondary | ICD-10-CM | POA: Diagnosis not present

## 2019-08-08 DIAGNOSIS — D509 Iron deficiency anemia, unspecified: Secondary | ICD-10-CM | POA: Diagnosis not present

## 2019-08-08 DIAGNOSIS — N186 End stage renal disease: Secondary | ICD-10-CM | POA: Diagnosis not present

## 2019-08-11 DIAGNOSIS — Z992 Dependence on renal dialysis: Secondary | ICD-10-CM | POA: Diagnosis not present

## 2019-08-11 DIAGNOSIS — N2581 Secondary hyperparathyroidism of renal origin: Secondary | ICD-10-CM | POA: Diagnosis not present

## 2019-08-11 DIAGNOSIS — N186 End stage renal disease: Secondary | ICD-10-CM | POA: Diagnosis not present

## 2019-08-11 DIAGNOSIS — D509 Iron deficiency anemia, unspecified: Secondary | ICD-10-CM | POA: Diagnosis not present

## 2019-08-11 DIAGNOSIS — D631 Anemia in chronic kidney disease: Secondary | ICD-10-CM | POA: Diagnosis not present

## 2019-08-13 ENCOUNTER — Other Ambulatory Visit: Payer: Self-pay

## 2019-08-13 DIAGNOSIS — N186 End stage renal disease: Secondary | ICD-10-CM | POA: Diagnosis not present

## 2019-08-13 DIAGNOSIS — Z992 Dependence on renal dialysis: Secondary | ICD-10-CM | POA: Diagnosis not present

## 2019-08-13 DIAGNOSIS — D509 Iron deficiency anemia, unspecified: Secondary | ICD-10-CM | POA: Diagnosis not present

## 2019-08-13 DIAGNOSIS — N2581 Secondary hyperparathyroidism of renal origin: Secondary | ICD-10-CM | POA: Diagnosis not present

## 2019-08-13 DIAGNOSIS — D631 Anemia in chronic kidney disease: Secondary | ICD-10-CM | POA: Diagnosis not present

## 2019-08-15 DIAGNOSIS — D509 Iron deficiency anemia, unspecified: Secondary | ICD-10-CM | POA: Diagnosis not present

## 2019-08-15 DIAGNOSIS — N186 End stage renal disease: Secondary | ICD-10-CM | POA: Diagnosis not present

## 2019-08-15 DIAGNOSIS — Z992 Dependence on renal dialysis: Secondary | ICD-10-CM | POA: Diagnosis not present

## 2019-08-15 DIAGNOSIS — N2581 Secondary hyperparathyroidism of renal origin: Secondary | ICD-10-CM | POA: Diagnosis not present

## 2019-08-15 DIAGNOSIS — D631 Anemia in chronic kidney disease: Secondary | ICD-10-CM | POA: Diagnosis not present

## 2019-08-18 DIAGNOSIS — D509 Iron deficiency anemia, unspecified: Secondary | ICD-10-CM | POA: Diagnosis not present

## 2019-08-18 DIAGNOSIS — N2581 Secondary hyperparathyroidism of renal origin: Secondary | ICD-10-CM | POA: Diagnosis not present

## 2019-08-18 DIAGNOSIS — Z992 Dependence on renal dialysis: Secondary | ICD-10-CM | POA: Diagnosis not present

## 2019-08-18 DIAGNOSIS — N186 End stage renal disease: Secondary | ICD-10-CM | POA: Diagnosis not present

## 2019-08-18 DIAGNOSIS — D631 Anemia in chronic kidney disease: Secondary | ICD-10-CM | POA: Diagnosis not present

## 2019-08-20 ENCOUNTER — Other Ambulatory Visit: Payer: Self-pay | Admitting: *Deleted

## 2019-08-20 DIAGNOSIS — D509 Iron deficiency anemia, unspecified: Secondary | ICD-10-CM | POA: Diagnosis not present

## 2019-08-20 DIAGNOSIS — Z992 Dependence on renal dialysis: Secondary | ICD-10-CM | POA: Diagnosis not present

## 2019-08-20 DIAGNOSIS — N186 End stage renal disease: Secondary | ICD-10-CM | POA: Diagnosis not present

## 2019-08-20 DIAGNOSIS — N2581 Secondary hyperparathyroidism of renal origin: Secondary | ICD-10-CM | POA: Diagnosis not present

## 2019-08-20 DIAGNOSIS — D631 Anemia in chronic kidney disease: Secondary | ICD-10-CM | POA: Diagnosis not present

## 2019-08-21 DIAGNOSIS — L930 Discoid lupus erythematosus: Secondary | ICD-10-CM | POA: Diagnosis not present

## 2019-08-21 DIAGNOSIS — L853 Xerosis cutis: Secondary | ICD-10-CM | POA: Diagnosis not present

## 2019-08-22 DIAGNOSIS — D631 Anemia in chronic kidney disease: Secondary | ICD-10-CM | POA: Diagnosis not present

## 2019-08-22 DIAGNOSIS — N2581 Secondary hyperparathyroidism of renal origin: Secondary | ICD-10-CM | POA: Diagnosis not present

## 2019-08-22 DIAGNOSIS — N186 End stage renal disease: Secondary | ICD-10-CM | POA: Diagnosis not present

## 2019-08-22 DIAGNOSIS — Z992 Dependence on renal dialysis: Secondary | ICD-10-CM | POA: Diagnosis not present

## 2019-08-22 DIAGNOSIS — D509 Iron deficiency anemia, unspecified: Secondary | ICD-10-CM | POA: Diagnosis not present

## 2019-08-24 DIAGNOSIS — Z992 Dependence on renal dialysis: Secondary | ICD-10-CM | POA: Diagnosis not present

## 2019-08-24 DIAGNOSIS — N186 End stage renal disease: Secondary | ICD-10-CM | POA: Diagnosis not present

## 2019-08-24 DIAGNOSIS — I158 Other secondary hypertension: Secondary | ICD-10-CM | POA: Diagnosis not present

## 2019-08-25 ENCOUNTER — Other Ambulatory Visit (HOSPITAL_COMMUNITY)
Admission: RE | Admit: 2019-08-25 | Discharge: 2019-08-25 | Disposition: A | Discharge status: Home / Self Care | Payer: Medicare Other | Source: Ambulatory Visit | Attending: Vascular Surgery | Admitting: Vascular Surgery

## 2019-08-25 ENCOUNTER — Encounter (HOSPITAL_COMMUNITY): Payer: Self-pay | Admitting: *Deleted

## 2019-08-25 ENCOUNTER — Other Ambulatory Visit: Payer: Self-pay

## 2019-08-25 DIAGNOSIS — N2581 Secondary hyperparathyroidism of renal origin: Secondary | ICD-10-CM | POA: Diagnosis not present

## 2019-08-25 DIAGNOSIS — Z20828 Contact with and (suspected) exposure to other viral communicable diseases: Secondary | ICD-10-CM | POA: Diagnosis not present

## 2019-08-25 DIAGNOSIS — D509 Iron deficiency anemia, unspecified: Secondary | ICD-10-CM | POA: Diagnosis not present

## 2019-08-25 DIAGNOSIS — Z992 Dependence on renal dialysis: Secondary | ICD-10-CM | POA: Diagnosis not present

## 2019-08-25 DIAGNOSIS — Z01812 Encounter for preprocedural laboratory examination: Secondary | ICD-10-CM | POA: Diagnosis not present

## 2019-08-25 DIAGNOSIS — D631 Anemia in chronic kidney disease: Secondary | ICD-10-CM | POA: Diagnosis not present

## 2019-08-25 DIAGNOSIS — N186 End stage renal disease: Secondary | ICD-10-CM | POA: Diagnosis not present

## 2019-08-25 LAB — SARS CORONAVIRUS 2 (TAT 6-24 HRS): SARS Coronavirus 2: NEGATIVE

## 2019-08-25 NOTE — Progress Notes (Signed)
Pt denies any acute cardiopulmonary issues. Pt stated that she is under the care of Dr. Percival Spanish, Cardiology and Dr. Yong Channel, PCP. Pt made aware to stop taking  vitamins, fish oil and herbal medications. Do not take any NSAIDs ie: Ibuprofen, Advil, Naproxen (Aleve), Motrin, BC and Goody Powder. Pt verbalized understanding of all pre-op instructions. PA, Anesthesiology,  asked to review pt history.

## 2019-08-26 DIAGNOSIS — N2581 Secondary hyperparathyroidism of renal origin: Secondary | ICD-10-CM | POA: Diagnosis not present

## 2019-08-26 DIAGNOSIS — Z992 Dependence on renal dialysis: Secondary | ICD-10-CM | POA: Diagnosis not present

## 2019-08-26 DIAGNOSIS — D631 Anemia in chronic kidney disease: Secondary | ICD-10-CM | POA: Diagnosis not present

## 2019-08-26 DIAGNOSIS — N186 End stage renal disease: Secondary | ICD-10-CM | POA: Diagnosis not present

## 2019-08-26 DIAGNOSIS — D509 Iron deficiency anemia, unspecified: Secondary | ICD-10-CM | POA: Diagnosis not present

## 2019-08-26 NOTE — Anesthesia Preprocedure Evaluation (Addendum)
Anesthesia Evaluation  Patient identified by MRN, date of birth, ID band Patient awake    Reviewed: Allergy & Precautions, NPO status , Patient's Chart, lab work & pertinent test results  Airway Mallampati: II  TM Distance: >3 FB Neck ROM: Full    Dental no notable dental hx.    Pulmonary shortness of breath, sleep apnea ,    Pulmonary exam normal breath sounds clear to auscultation       Cardiovascular hypertension, Pt. on medications +CHF and + DOE  Normal cardiovascular exam Rhythm:Regular Rate:Normal     Neuro/Psych TIAnegative psych ROS   GI/Hepatic Neg liver ROS, GERD  ,  Endo/Other  negative endocrine ROS  Renal/GU ESRF and DialysisRenal disease  negative genitourinary   Musculoskeletal negative musculoskeletal ROS (+)   Abdominal   Peds negative pediatric ROS (+)  Hematology negative hematology ROS (+) anemia ,   Anesthesia Other Findings   Reproductive/Obstetrics negative OB ROS                            Anesthesia Physical Anesthesia Plan  ASA: IV  Anesthesia Plan: MAC   Post-op Pain Management:    Induction: Intravenous  PONV Risk Score and Plan: 2 and Ondansetron, Midazolam and Treatment may vary due to age or medical condition  Airway Management Planned: Simple Face Mask  Additional Equipment:   Intra-op Plan:   Post-operative Plan:   Informed Consent: I have reviewed the patients History and Physical, chart, labs and discussed the procedure including the risks, benefits and alternatives for the proposed anesthesia with the patient or authorized representative who has indicated his/her understanding and acceptance.     Dental advisory given  Plan Discussed with: CRNA  Anesthesia Plan Comments: (Hx of HFrEF with EF of 25% with severe LVH. EF has since recovered to >55%.  She had normal coronaries by cath 2019, however, cath also showed severe pulm HTN. Last  saw Dr. Percival Spanish 2/20 and updated echo was ordered which showed moderate but not severe LVH. EF 60-65%.  Pulmonary pressures only mildly elevated.  No other cardiac work up was recommended at that time.   Follows with Dr. Melvyn Novas for hx of DOE. Per last OV note 04/09/19 "She carries dx of sle and not scleroderma but ? If she could have some overlap or MCTD dx and no problem with using opsimut here pending eval of records from [place] though I sense most of problems at this point are related to conditioning as the findings on today's walking study don't really support PAH or lung dz as being the problem... 04/08/2019 - Walked RA  2 laps @  approx 233f each @ moderate pace  stopped due to end of study with sats 100% at end and mild sob so rec regular sub max ex/ monitor 02 sats on and off hd and post HD."   ESRD on HD due to lupus nephritis.  Pt is same day workup and will need DOS labs and eval.  TTE 12/31/18  1. The left ventricle has normal systolic function with an ejection fraction of 60-65%. The cavity size was normal. There is moderate concentric left ventricular hypertrophy. Left ventricular diastolic parameters were normal.  2. The right ventricle has normal systolic function. The cavity was normal. There is no increase in right ventricular wall thickness. Right ventricular systolic pressure is mildly elevated with an estimated pressure of 39.5 mmHg.  3. Left atrial size was mildly dilated.  4. Right atrial size was mildly dilated.  5. Pulmonic valve regurgitation is mild by color flow Doppler.  6. There is mild dilatation of the ascending aorta measuring 39 mm.  Cath 03/15/18 (care everywhere): Impression: 1. Severe pulmonary hypertension with no response to IV adenosine  challenge. 2. Elevated left ventricular filling pressure. 3. No evidence of mitral or aortic stenosis. 4. Normal coronary arteries.  Discussion: The patient's pulmonary hypertension is possibly due to  lupus. She will  need evaluation to evaluate for possible chronic  recurrent multiple pulmonary emboli with ventilation perfusion scanning.  In addition, she will require pulmonary referral.  Continue current medications. Refer any further evaluation and workup to  Dr. Perry Mount, her primary cardiologist. )   Anesthesia Quick Evaluation

## 2019-08-26 NOTE — Progress Notes (Signed)
Anesthesia Chart Review: Same day workup  Hx of HFrEF with EF of 25% with severe LVH. EF has since recovered to >55%.  She had normal coronaries by cath 2019, however, cath also showed severe pulm HTN. Last saw Dr. Percival Spanish 2/20 and updated echo was ordered which showed moderate but not severe LVH. EF 60-65%.  Pulmonary pressures only mildly elevated.  No other cardiac work up was recommended at that time.   Follows with Dr. Melvyn Novas for hx of DOE. Per last OV note 04/09/19 "She carries dx of sle and not scleroderma but ? If she could have some overlap or MCTD dx and no problem with using opsimut here pending eval of records from [place] though I sense most of problems at this point are related to conditioning as the findings on today's walking study don't really support PAH or lung dz as being the problem... 04/08/2019 - Walked RA  2 laps @  approx 283ft each @ moderate pace  stopped due to end of study with sats 100% at end and mild sob so rec regular sub max ex/ monitor 02 sats on and off hd and post HD."   ESRD on HD due to lupus nephritis.  Pt is same day workup and will need DOS labs and eval.  TTE 12/31/18  1. The left ventricle has normal systolic function with an ejection fraction of 60-65%. The cavity size was normal. There is moderate concentric left ventricular hypertrophy. Left ventricular diastolic parameters were normal.  2. The right ventricle has normal systolic function. The cavity was normal. There is no increase in right ventricular wall thickness. Right ventricular systolic pressure is mildly elevated with an estimated pressure of 39.5 mmHg.  3. Left atrial size was mildly dilated.  4. Right atrial size was mildly dilated.  5. Pulmonic valve regurgitation is mild by color flow Doppler.  6. There is mild dilatation of the ascending aorta measuring 39 mm.  Cath 03/15/18 (care everywhere): Impression: 1. Severe pulmonary hypertension with no response to IV adenosine  challenge. 2.  Elevated left ventricular filling pressure. 3. No evidence of mitral or aortic stenosis. 4. Normal coronary arteries.  Discussion: The patient's pulmonary hypertension is possibly due to  lupus. She will need evaluation to evaluate for possible chronic  recurrent multiple pulmonary emboli with ventilation perfusion scanning.  In addition, she will require pulmonary referral.  Continue current medications. Refer any further evaluation and workup to  Dr. Perry Mount, her primary cardiologist.    Karoline Caldwell, PA-C [facility] Short Stay Center/Anesthesiology Phone [phone] 08/26/2019 11:31 AM

## 2019-08-27 ENCOUNTER — Other Ambulatory Visit: Payer: Self-pay

## 2019-08-27 ENCOUNTER — Encounter (HOSPITAL_COMMUNITY)
Admission: RE | Disposition: A | Discharge status: Home / Self Care | Payer: Self-pay | Source: Home / Self Care | Attending: Vascular Surgery

## 2019-08-27 ENCOUNTER — Ambulatory Visit (HOSPITAL_COMMUNITY): Payer: Medicare Other | Admitting: Physician Assistant

## 2019-08-27 ENCOUNTER — Encounter (HOSPITAL_COMMUNITY): Payer: Self-pay | Admitting: Certified Registered"

## 2019-08-27 ENCOUNTER — Ambulatory Visit (HOSPITAL_COMMUNITY)
Admission: RE | Admit: 2019-08-27 | Discharge: 2019-08-27 | Disposition: A | Discharge status: Home / Self Care | Payer: Medicare Other | Attending: Vascular Surgery | Admitting: Vascular Surgery

## 2019-08-27 DIAGNOSIS — K219 Gastro-esophageal reflux disease without esophagitis: Secondary | ICD-10-CM | POA: Insufficient documentation

## 2019-08-27 DIAGNOSIS — N186 End stage renal disease: Secondary | ICD-10-CM | POA: Insufficient documentation

## 2019-08-27 DIAGNOSIS — Z886 Allergy status to analgesic agent status: Secondary | ICD-10-CM | POA: Diagnosis not present

## 2019-08-27 DIAGNOSIS — I132 Hypertensive heart and chronic kidney disease with heart failure and with stage 5 chronic kidney disease, or end stage renal disease: Secondary | ICD-10-CM | POA: Insufficient documentation

## 2019-08-27 DIAGNOSIS — I272 Pulmonary hypertension, unspecified: Secondary | ICD-10-CM | POA: Insufficient documentation

## 2019-08-27 DIAGNOSIS — Z992 Dependence on renal dialysis: Secondary | ICD-10-CM | POA: Insufficient documentation

## 2019-08-27 DIAGNOSIS — I429 Cardiomyopathy, unspecified: Secondary | ICD-10-CM | POA: Insufficient documentation

## 2019-08-27 DIAGNOSIS — Z832 Family history of diseases of the blood and blood-forming organs and certain disorders involving the immune mechanism: Secondary | ICD-10-CM | POA: Diagnosis not present

## 2019-08-27 DIAGNOSIS — Z888 Allergy status to other drugs, medicaments and biological substances status: Secondary | ICD-10-CM | POA: Diagnosis not present

## 2019-08-27 DIAGNOSIS — M3214 Glomerular disease in systemic lupus erythematosus: Secondary | ICD-10-CM | POA: Insufficient documentation

## 2019-08-27 DIAGNOSIS — I371 Nonrheumatic pulmonary valve insufficiency: Secondary | ICD-10-CM | POA: Insufficient documentation

## 2019-08-27 DIAGNOSIS — Y838 Other surgical procedures as the cause of abnormal reaction of the patient, or of later complication, without mention of misadventure at the time of the procedure: Secondary | ICD-10-CM | POA: Diagnosis not present

## 2019-08-27 DIAGNOSIS — I5020 Unspecified systolic (congestive) heart failure: Secondary | ICD-10-CM | POA: Diagnosis not present

## 2019-08-27 DIAGNOSIS — Z79899 Other long term (current) drug therapy: Secondary | ICD-10-CM | POA: Diagnosis not present

## 2019-08-27 DIAGNOSIS — Z8249 Family history of ischemic heart disease and other diseases of the circulatory system: Secondary | ICD-10-CM | POA: Insufficient documentation

## 2019-08-27 DIAGNOSIS — G4730 Sleep apnea, unspecified: Secondary | ICD-10-CM | POA: Diagnosis not present

## 2019-08-27 DIAGNOSIS — T829XXA Unspecified complication of cardiac and vascular prosthetic device, implant and graft, initial encounter: Secondary | ICD-10-CM | POA: Diagnosis not present

## 2019-08-27 DIAGNOSIS — Z7952 Long term (current) use of systemic steroids: Secondary | ICD-10-CM | POA: Diagnosis not present

## 2019-08-27 DIAGNOSIS — I509 Heart failure, unspecified: Secondary | ICD-10-CM | POA: Diagnosis not present

## 2019-08-27 DIAGNOSIS — T82510A Breakdown (mechanical) of surgically created arteriovenous fistula, initial encounter: Secondary | ICD-10-CM | POA: Diagnosis not present

## 2019-08-27 DIAGNOSIS — Z8673 Personal history of transient ischemic attack (TIA), and cerebral infarction without residual deficits: Secondary | ICD-10-CM | POA: Diagnosis not present

## 2019-08-27 HISTORY — DX: Other specified health status: Z78.9

## 2019-08-27 HISTORY — DX: Pulmonary hypertension, unspecified: I27.20

## 2019-08-27 HISTORY — PX: REVISON OF ARTERIOVENOUS FISTULA: SHX6074

## 2019-08-27 HISTORY — DX: Dyspnea, unspecified: R06.00

## 2019-08-27 LAB — POCT I-STAT, CHEM 8
BUN: 18 mg/dL (ref 6–20)
Calcium, Ion: 1.07 mmol/L — ABNORMAL LOW (ref 1.15–1.40)
Chloride: 96 mmol/L — ABNORMAL LOW (ref 98–111)
Creatinine, Ser: 8.4 mg/dL — ABNORMAL HIGH (ref 0.44–1.00)
Glucose, Bld: 73 mg/dL (ref 70–99)
HCT: 39 % (ref 36.0–46.0)
Hemoglobin: 13.3 g/dL (ref 12.0–15.0)
Potassium: 4.1 mmol/L (ref 3.5–5.1)
Sodium: 137 mmol/L (ref 135–145)
TCO2: 30 mmol/L (ref 22–32)

## 2019-08-27 LAB — HCG, SERUM, QUALITATIVE: Preg, Serum: NEGATIVE

## 2019-08-27 SURGERY — REVISON OF ARTERIOVENOUS FISTULA
Anesthesia: Monitor Anesthesia Care | Laterality: Right

## 2019-08-27 MED ORDER — CEFAZOLIN SODIUM-DEXTROSE 2-4 GM/100ML-% IV SOLN
INTRAVENOUS | Status: AC
  Filled 2019-08-27: qty 100

## 2019-08-27 MED ORDER — PHENYLEPHRINE 40 MCG/ML (10ML) SYRINGE FOR IV PUSH (FOR BLOOD PRESSURE SUPPORT)
PREFILLED_SYRINGE | INTRAVENOUS | Status: AC
  Filled 2019-08-27: qty 10

## 2019-08-27 MED ORDER — PROMETHAZINE HCL 25 MG/ML IJ SOLN: 6.2500 mg | INTRAMUSCULAR | Status: DC | PRN

## 2019-08-27 MED ORDER — PROPOFOL 10 MG/ML IV BOLUS
INTRAVENOUS | Status: AC
  Filled 2019-08-27: qty 20

## 2019-08-27 MED ORDER — OXYCODONE HCL 5 MG PO TABS: 5.0000 mg | ORAL_TABLET | Freq: Once | ORAL | Status: DC | PRN

## 2019-08-27 MED ORDER — MIDAZOLAM HCL 5 MG/5ML IJ SOLN
INTRAMUSCULAR | Status: DC | PRN
  Administered 2019-08-27: 2 mg via INTRAVENOUS

## 2019-08-27 MED ORDER — ONDANSETRON HCL 4 MG/2ML IJ SOLN
INTRAMUSCULAR | Status: DC | PRN
  Administered 2019-08-27: 4 mg via INTRAVENOUS

## 2019-08-27 MED ORDER — EPHEDRINE SULFATE-NACL 50-0.9 MG/10ML-% IV SOSY
PREFILLED_SYRINGE | INTRAVENOUS | Status: DC | PRN
  Administered 2019-08-27: 10 mg via INTRAVENOUS

## 2019-08-27 MED ORDER — ONDANSETRON HCL 4 MG/2ML IJ SOLN
INTRAMUSCULAR | Status: AC
  Filled 2019-08-27: qty 2

## 2019-08-27 MED ORDER — PROPOFOL 10 MG/ML IV BOLUS
INTRAVENOUS | Status: DC | PRN
  Administered 2019-08-27 (×3): 10 mg via INTRAVENOUS

## 2019-08-27 MED ORDER — SODIUM CHLORIDE 0.9 % IV SOLN
INTRAVENOUS | Status: DC | PRN
  Administered 2019-08-27: 12:00:00 500 mL

## 2019-08-27 MED ORDER — SODIUM CHLORIDE 0.9 % IR SOLN
Status: DC | PRN
  Administered 2019-08-27: 1000 mL

## 2019-08-27 MED ORDER — PHENYLEPHRINE 40 MCG/ML (10ML) SYRINGE FOR IV PUSH (FOR BLOOD PRESSURE SUPPORT)
PREFILLED_SYRINGE | INTRAVENOUS | Status: DC | PRN
  Administered 2019-08-27 (×5): 80 ug via INTRAVENOUS

## 2019-08-27 MED ORDER — HYDROMORPHONE HCL 1 MG/ML IJ SOLN: 0.2500 mg | INTRAMUSCULAR | Status: DC | PRN

## 2019-08-27 MED ORDER — PROPOFOL 500 MG/50ML IV EMUL
INTRAVENOUS | Status: DC | PRN
  Administered 2019-08-27: 75 ug/kg/min via INTRAVENOUS

## 2019-08-27 MED ORDER — CHLORHEXIDINE GLUCONATE 4 % EX LIQD: 60.0000 mL | Freq: Once | CUTANEOUS | Status: DC

## 2019-08-27 MED ORDER — SODIUM CHLORIDE 0.9 % IV SOLN
INTRAVENOUS | Status: AC
  Filled 2019-08-27: qty 1.2

## 2019-08-27 MED ORDER — OXYCODONE HCL 5 MG PO TABS: 5.0000 mg | ORAL_TABLET | Freq: Four times a day (QID) | ORAL | 0 refills | Status: DC | PRN

## 2019-08-27 MED ORDER — LIDOCAINE 2% (20 MG/ML) 5 ML SYRINGE
INTRAMUSCULAR | Status: DC | PRN
  Administered 2019-08-27: 40 mg via INTRAVENOUS

## 2019-08-27 MED ORDER — PROTAMINE SULFATE 10 MG/ML IV SOLN
INTRAVENOUS | Status: AC
  Filled 2019-08-27: qty 5

## 2019-08-27 MED ORDER — FENTANYL CITRATE (PF) 100 MCG/2ML IJ SOLN
INTRAMUSCULAR | Status: DC | PRN
  Administered 2019-08-27: 50 ug via INTRAVENOUS

## 2019-08-27 MED ORDER — LABETALOL HCL 5 MG/ML IV SOLN
INTRAVENOUS | Status: AC
  Filled 2019-08-27: qty 4

## 2019-08-27 MED ORDER — LIDOCAINE-EPINEPHRINE 0.5 %-1:200000 IJ SOLN
INTRAMUSCULAR | Status: AC
  Filled 2019-08-27: qty 1

## 2019-08-27 MED ORDER — SODIUM CHLORIDE 0.9 % IV SOLN
INTRAVENOUS | Status: DC
  Administered 2019-08-27: 10:00:00 via INTRAVENOUS

## 2019-08-27 MED ORDER — OXYCODONE HCL 5 MG/5ML PO SOLN: 5.0000 mg | Freq: Once | ORAL | Status: DC | PRN

## 2019-08-27 MED ORDER — LIDOCAINE-EPINEPHRINE 0.5 %-1:200000 IJ SOLN
INTRAMUSCULAR | Status: DC | PRN
  Administered 2019-08-27: 17 mL

## 2019-08-27 MED ORDER — MIDAZOLAM HCL 2 MG/2ML IJ SOLN
INTRAMUSCULAR | Status: AC
  Filled 2019-08-27: qty 2

## 2019-08-27 MED ORDER — CEFAZOLIN SODIUM-DEXTROSE 2-4 GM/100ML-% IV SOLN
2.0000 g | INTRAVENOUS | Status: AC
  Administered 2019-08-27: 2 g via INTRAVENOUS

## 2019-08-27 MED ORDER — FENTANYL CITRATE (PF) 250 MCG/5ML IJ SOLN
INTRAMUSCULAR | Status: AC
  Filled 2019-08-27: qty 5

## 2019-08-27 SURGICAL SUPPLY — 35 items
ARMBAND PINK RESTRICT EXTREMIT (MISCELLANEOUS) ×3 IMPLANT
CANISTER SUCT 3000ML PPV (MISCELLANEOUS) ×3 IMPLANT
CANNULA VESSEL 3MM 2 BLNT TIP (CANNULA) ×3 IMPLANT
CLIP LIGATING EXTRA MED SLVR (CLIP) ×3 IMPLANT
CLIP LIGATING EXTRA SM BLUE (MISCELLANEOUS) ×3 IMPLANT
COVER PROBE W GEL 5X96 (DRAPES) IMPLANT
COVER WAND RF STERILE (DRAPES) ×1 IMPLANT
DECANTER SPIKE VIAL GLASS SM (MISCELLANEOUS) ×3 IMPLANT
DERMABOND ADVANCED (GAUZE/BANDAGES/DRESSINGS) ×2
DERMABOND ADVANCED .7 DNX12 (GAUZE/BANDAGES/DRESSINGS) ×1 IMPLANT
ELECT REM PT RETURN 9FT ADLT (ELECTROSURGICAL) ×3
ELECTRODE REM PT RTRN 9FT ADLT (ELECTROSURGICAL) ×1 IMPLANT
GLOVE BIOGEL PI IND STRL 6.5 (GLOVE) IMPLANT
GLOVE BIOGEL PI IND STRL 7.0 (GLOVE) IMPLANT
GLOVE BIOGEL PI INDICATOR 6.5 (GLOVE) ×6
GLOVE BIOGEL PI INDICATOR 7.0 (GLOVE) ×6
GLOVE ECLIPSE 6.5 STRL STRAW (GLOVE) ×2 IMPLANT
GLOVE ECLIPSE 7.0 STRL STRAW (GLOVE) ×2 IMPLANT
GLOVE SS BIOGEL STRL SZ 7.5 (GLOVE) ×1 IMPLANT
GLOVE SUPERSENSE BIOGEL SZ 7.5 (GLOVE) ×2
GLOVE SURG SS PI 6.5 STRL IVOR (GLOVE) ×2 IMPLANT
GOWN STRL REUS W/ TWL LRG LVL3 (GOWN DISPOSABLE) ×3 IMPLANT
GOWN STRL REUS W/TWL LRG LVL3 (GOWN DISPOSABLE) ×8
KIT BASIN OR (CUSTOM PROCEDURE TRAY) ×3 IMPLANT
KIT TURNOVER KIT B (KITS) ×3 IMPLANT
NS IRRIG 1000ML POUR BTL (IV SOLUTION) ×3 IMPLANT
PACK CV ACCESS (CUSTOM PROCEDURE TRAY) ×3 IMPLANT
PAD ARMBOARD 7.5X6 YLW CONV (MISCELLANEOUS) ×6 IMPLANT
SUT PROLENE 5 0 C 1 24 (SUTURE) ×4 IMPLANT
SUT PROLENE 6 0 CC (SUTURE) ×3 IMPLANT
SUT VIC AB 3-0 SH 27 (SUTURE) ×2
SUT VIC AB 3-0 SH 27X BRD (SUTURE) ×1 IMPLANT
TOWEL GREEN STERILE (TOWEL DISPOSABLE) ×3 IMPLANT
UNDERPAD 30X30 (UNDERPADS AND DIAPERS) ×3 IMPLANT
WATER STERILE IRR 1000ML POUR (IV SOLUTION) ×3 IMPLANT

## 2019-08-27 NOTE — Discharge Instructions (Signed)
° °  [facility] [place] ° °Discharge Instructions ° °AV Fistula or Graft Surgery for Dialysis Access ° °Please refer to the following instructions for your post-procedure care. Your surgeon or physician assistant will discuss any changes with you. ° °Activity ° °You may drive the day following your surgery, if you are comfortable and no longer taking prescription pain medication. Resume full activity as the soreness in your incision resolves. ° °Bathing/Showering ° °You may shower after you go home. Keep your incision dry for 48 hours. Do not soak in a bathtub, hot tub, or swim until the incision heals completely. You may not shower if you have a hemodialysis catheter. ° °Incision Care ° °Clean your incision with mild soap and water after 48 hours. Pat the area dry with a clean towel. You do not need a bandage unless otherwise instructed. Do not apply any ointments or creams to your incision. You may have skin glue on your incision. Do not peel it off. It will come off on its own in about one week. Your arm may swell a bit after surgery. To reduce swelling use pillows to elevate your arm so it is above your heart. Your doctor will tell you if you need to lightly wrap your arm with an ACE bandage. ° °Diet ° °Resume your normal diet. There are not special food restrictions following this procedure. In order to heal from your surgery, it is CRITICAL to get adequate nutrition. Your body requires vitamins, minerals, and protein. Vegetables are the best source of vitamins and minerals. Vegetables also provide the perfect balance of protein. Processed food has little nutritional value, so try to avoid this. ° °Medications ° °Resume taking all of your medications. If your incision is causing pain, you may take over-the counter pain relievers such as acetaminophen (Tylenol). If you were prescribed a stronger pain medication, please be aware these medications can cause nausea and constipation. Prevent  nausea by taking the medication with a snack or meal. Avoid constipation by drinking plenty of fluids and eating foods with high amount of fiber, such as fruits, vegetables, and grains. Do not take Tylenol if you are taking prescription pain medications. ° ° ° ° °Follow up °Your surgeon may want to see you in the office following your access surgery. If so, this will be arranged at the time of your surgery. ° °Please call us immediately for any of the following conditions: ° °Increased pain, redness, drainage (pus) from your incision site °Fever of 101 degrees or higher °Severe or worsening pain at your incision site °Hand pain or numbness. ° °Reduce your risk of vascular disease: ° °Stop smoking. If you would like help, call [facility] at 1-800-QUIT-NOW (1-800-784-8669) or  at 336-586-4000 ° °Manage your cholesterol °Maintain a desired weight °Control your diabetes °Keep your blood pressure down ° °Dialysis ° °It will take several weeks to several months for your new dialysis access to be ready for use. Your surgeon will determine when it is OK to use it. Your nephrologist will continue to direct your dialysis. You can continue to use your Permcath until your new access is ready for use. ° °If you have any questions, please call the office at [phone]. ° °

## 2019-08-27 NOTE — Transfer of Care (Signed)
Immediate Anesthesia Transfer of Care Note  Patient: [name] [name]  Procedure(s) Performed: REVISON OF ARTERIOVENOUS FISTULA  AND PLICATION RIGHT ARM (Right )  Patient Location: PACU  Anesthesia Type:MAC  Level of Consciousness: awake and patient cooperative  Airway & Oxygen Therapy: Patient Spontanous Breathing  Post-op Assessment: Report given to RN, Post -op Vital signs reviewed and stable and Patient moving all extremities  Post vital signs: Reviewed and stable  Last Vitals:  Vitals Value Taken Time  BP 90/56 08/27/19 1256  Temp    Pulse 97 08/27/19 1256  Resp 19 08/27/19 1256  SpO2 99 % 08/27/19 1256  Vitals shown include unvalidated device data.  Last Pain:  Vitals:   08/27/19 0950  TempSrc:   PainSc: 0-No pain      Patients Stated Pain Goal: 4 (78/67/67 2094)  Complications: No apparent anesthesia complications

## 2019-08-27 NOTE — Anesthesia Postprocedure Evaluation (Signed)
Anesthesia Post Note  Patient: [name] [name]  Procedure(s) Performed: REVISON OF ARTERIOVENOUS FISTULA  AND PLICATION RIGHT ARM (Right )     Patient location during evaluation: PACU Anesthesia Type: MAC Level of consciousness: awake and alert Pain management: pain level controlled Vital Signs Assessment: post-procedure vital signs reviewed and stable Respiratory status: spontaneous breathing, nonlabored ventilation and respiratory function stable Cardiovascular status: stable and blood pressure returned to baseline Postop Assessment: no apparent nausea or vomiting Anesthetic complications: no    Last Vitals:  Vitals:   08/27/19 1256 08/27/19 1306  BP: (!) 90/56 (!) 91/56  Pulse: 97   Resp: 19   Temp: 36.7 C 36.7 C  SpO2: 99% 97%    Last Pain:  Vitals:   08/27/19 1306  TempSrc:   PainSc: 0-No pain                 Lynda Rainwater

## 2019-08-27 NOTE — H&P (Signed)
Office Visit  06/24/2019 [facility] -Judge Stall, Arvilla Meres, MD Vascular Surgery  ESRD on dialysis [facility]) Dx  New Patient (Initial Visit) ; Referred by Marin Olp, MD Reason for Visit  Additional Documentation  Vitals:   BP 95/70 (BP Location: Left Arm, Patient Position: Sitting, Cuff Size: Large)  Pulse 86  Temp 97.3 F (36.3 C)   Resp 18  Ht 5\' 7"  (1.702 m)  Wt 87.5 kg  SpO2 100%  BMI 30.23 kg/m  BSA 2.03 m  Flowsheets:   Clinical Intake,  Healthcare Directives,  Vital Signs,  MEWS Score,  Anthropometrics,  Method of Visit    Encounter Info:   Billing Info,  History,  Allergies,  Detailed Report    Orthostatic Vitals Recorded in This Encounter   06/24/2019  1044     Patient Position: Sitting  BP Location: Left Arm  Cuff Size: Large  All Notes  Progress Notes by Rosetta Posner, MD at 06/24/2019 10:40 AM Author: Rosetta Posner, MD Author Type: Physician Filed: 06/24/2019 1:00 PM  Note Status: Signed Cosign: Cosign Not Required Encounter Date: 06/24/2019  Editor: Rosetta Posner, MD (Physician)                                        [facility]  Patient name: [name] [name]     MRN: [id number]        DOB: 1981/03/19        Sex: female  REASON FOR CONSULT: Evaluation of skin changes over right brachiocephalic fistula  HPI: [name] [name] is a 38 y.o. female, who is here today for evaluation.  She had had initially placement of a right brachiocephalic fistula with Dr. Scot Dock on 05/23/2016.  She is required no surgical revision since they initial placement.  She does report she has had no vascular treatment at [facility].  She is seen today for concern of some superficial ulceration over the fistula in her upper arm.  Denies any bleeding from this.  She has had ability to have access of her fistula      Past Medical History:  Diagnosis Date  . CHF (congestive heart failure) ([place]) 5-6 yrs  ago  hosp  . Chronic kidney disease   . ERYTHEMATOSUS, LUPUS 08/07/2006  . Hypertension   . Intestinal infection due to Clostridium difficile   . Secondary cardiomyopathy, unspecified   . Sleep apnea    does not wear CPAP  . Systemic lupus erythematosus ([place])   . TIA (transient ischemic attack)   . Unspecified deficiency anemia   . Unspecified essential hypertension          Family History  Problem Relation Age of Onset  . Lupus Mother   . Cancer Maternal Grandmother        unknown  . Prostate cancer Paternal Grandfather   . Hypertension Father   . Diabetes Brother   . Diabetes Other        mat cousin    SOCIAL HISTORY: Social History        Socioeconomic History  . Marital status: Single    Spouse name: Not on file  . Number of children: 0  . Years of education: Not on file  . Highest education level: Not on file  Occupational History  . Occupation: Education administrator   .  Occupation: Scientist, clinical (histocompatibility and immunogenetics): [employer]  Social Needs  . Financial resource strain: Not on file  . Food insecurity    Worry: Not on file    Inability: Not on file  . Transportation needs    Medical: Not on file    Non-medical: Not on file  Tobacco Use  . Smoking status: Never Smoker  . Smokeless tobacco: Never Used  Substance and Sexual Activity  . Alcohol use: No    Alcohol/week: 0.0 standard drinks  . Drug use: No  . Sexual activity: Never  Lifestyle  . Physical activity    Days per week: Not on file    Minutes per session: Not on file  . Stress: Not on file  Relationships  . Social Herbalist on phone: Not on file    Gets together: Not on file    Attends religious service: Not on file    Active member of club or organization: Not on file    Attends meetings of clubs or organizations: Not on file    Relationship status: Not on file  . Intimate partner violence    Fear of current or ex partner: Not on  file    Emotionally abused: Not on file    Physically abused: Not on file    Forced sexual activity: Not on file  Other Topics Concern  . Not on file  Social History Narrative   Lives with sister.             Allergies  Allergen Reactions  . Lisinopril Swelling    SWELLING OF TOP LIP  . Nsaids Other (See Comments)    MD TOLD PATIENT TO AVOID NSAIDS DUE TO RENAL INSUFFICIENY.          Current Outpatient Medications  Medication Sig Dispense Refill  . amLODipine (NORVASC) 10 MG tablet Take 10 mg by mouth every evening.   4  . b complex-vitamin c-folic acid (NEPHRO-VITE) 0.8 MG TABS tablet Take 1 tablet by mouth daily.    . carvedilol (COREG) 25 MG tablet Take 25 mg by mouth 2 (two) times daily.   9  . cinacalcet (SENSIPAR) 30 MG tablet TAKE 1 TABLET BY MOUTH DAILY WITH DINNER (DO NOT TAKE LESS THAN 12 HOURS PRIOR TO DIALYSIS)    . cloNIDine (CATAPRES) 0.1 MG tablet Take 0.1 mg by mouth 2 (two) times daily.  0  . ferric citrate (AURYXIA) 1 GM 210 MG(Fe) tablet Auryxia 210 mg iron tablet    . glycopyrrolate (ROBINUL) 2 MG tablet Take 1 tablet (2 mg total) by mouth 2 (two) times daily. 60 tablet 11  . hydroxychloroquine (PLAQUENIL) 200 MG tablet TAKE 1 TABLET BY MOUTH TWICE A DAY (Patient taking differently: Take 100-200 mg by mouth See admin instructions. Take 1/2 tablet (100 mg) by mouth daily in the morning & take 1 tablet (200 mg) by mouth at night.) 60 tablet 1  . lidocaine-prilocaine (EMLA) cream Apply 1 application topically daily as needed. 30 g 0  . losartan (COZAAR) 100 MG tablet Take 100 mg by mouth every evening.   11  . macitentan (OPSUMIT) 10 MG tablet Take 10 mg by mouth daily.    . ondansetron (ZOFRAN-ODT) 4 MG disintegrating tablet Take 1 tablet (4 mg total) by mouth every 8 (eight) hours as needed for nausea or vomiting. 30 tablet 2  . pantoprazole (PROTONIX) 40 MG tablet TAKE 1 TABLET BY MOUTH EVERY DAY 90 tablet 0  . predniSONE  (  DELTASONE) 10 MG tablet Take 10 mg by mouth at bedtime.   12  . sucroferric oxyhydroxide (VELPHORO) 500 MG chewable tablet Velphoro 500 mg chewable tablet    . triamcinolone ointment (KENALOG) 0.1 % Apply 1 application topically daily as needed (FOR RASH/SKIN IRRITATION.).     No current facility-administered medications for this visit.     REVIEW OF SYSTEMS:  [X]  denotes positive finding, [ ]  denotes negative finding Cardiac  Comments:  Chest pain or chest pressure:    Shortness of breath upon exertion: x   Short of breath when lying flat:    Irregular heart rhythm:        Vascular    Pain in calf, thigh, or hip brought on by ambulation:    Pain in feet at night that wakes you up from your sleep:     Blood clot in your veins:    Leg swelling:         Pulmonary    Oxygen at home:    Productive cough:     Wheezing:         Neurologic    Sudden weakness in arms or legs:     Sudden numbness in arms or legs:     Sudden onset of difficulty speaking or slurred speech:    Temporary loss of vision in one eye:     Problems with dizziness:         Gastrointestinal    Blood in stool:     Vomited blood:         Genitourinary    Burning when urinating:     Blood in urine:        Psychiatric    Major depression:         Hematologic    Bleeding problems:    Problems with blood clotting too easily:        Skin    Rashes or ulcers:        Constitutional    Fever or chills:      PHYSICAL EXAM:    Vitals:   06/24/19 1044  BP: 95/70  Pulse: 86  Resp: 18  Temp: (!) 97.3 F (36.3 C)  SpO2: 100%  Weight: 87.5 kg  Height: 5\' 7"  (1.702 m)    GENERAL: The patient is a well-nourished female, in no acute distress. The vital signs are documented above. CARDIOVASCULAR: Palpable right radial pulse.  Venous aneurysmal change of right upper arm AV fistula.  The more  proximal area of the access site does have some superficial skin breakdown and blistering.  This does not appear to go full-thickness.  The access site around this does not have any evidence of skin breakdown. PULMONARY: There is good air exchange  ABDOMEN: Soft and non-tender  MUSCULOSKELETAL: There are no major deformities or cyanosis. NEUROLOGIC: No focal weakness or paresthesias are detected. SKIN: There are no ulcers or rashes noted. PSYCHIATRIC: The patient has a normal affect.  DATA:  None  MEDICAL ISSUES: Had long discussion with the patient.  I explained that she currently does not have any evidence of full-thickness skin breakdown and would feel comfortable with continued observation.  She does not report any significant pain associated with her access.  She reports that the technicians are able to obtain needle access without difficulty.  I explained that if she is having increasing pain, difficulty for access or more skin breakdown would recommend resection of this area and plication of the vein.  She understands  and wishes continued observation at this time   Rosetta Posner, MD [facility] [phone] Pager [phone]       Addendum:  The patient has been re-examined and re-evaluated.  The patient's history and physical has been reviewed and is unchanged.    [name] [name] is a 38 y.o. female is being admitted with END STAGE RENAL DISEASE ON HEMODIALYSIS. All the risks, benefits and other treatment options have been discussed with the patient. The patient has consented to proceed with Procedure(s): REVISON OF ARTERIOVENOUS FISTULA AND PLICATION RIGHT ARM as a surgical intervention.  Patric Buckhalter 08/27/2019 11:09 AM Vascular and Vein Surgery

## 2019-08-27 NOTE — H&P (View-Only) (Signed)
Office Visit  06/24/2019 [facility] -Judge Stall, Arvilla Meres, MD Vascular Surgery  ESRD on dialysis [facility]) Dx  New Patient (Initial Visit) ; Referred by Marin Olp, MD Reason for Visit  Additional Documentation  Vitals:   BP 95/70 (BP Location: Left Arm, Patient Position: Sitting, Cuff Size: Large)  Pulse 86  Temp 97.3 F (36.3 C)   Resp 18  Ht 5\' 7"  (1.702 m)  Wt 87.5 kg  SpO2 100%  BMI 30.23 kg/m  BSA 2.03 m  Flowsheets:   Clinical Intake,  Healthcare Directives,  Vital Signs,  MEWS Score,  Anthropometrics,  Method of Visit    Encounter Info:   Billing Info,  History,  Allergies,  Detailed Report    Orthostatic Vitals Recorded in This Encounter   06/24/2019  1044     Patient Position: Sitting  BP Location: Left Arm  Cuff Size: Large  All Notes  Progress Notes by Rosetta Posner, MD at 06/24/2019 10:40 AM Author: Rosetta Posner, MD Author Type: Physician Filed: 06/24/2019 1:00 PM  Note Status: Signed Cosign: Cosign Not Required Encounter Date: 06/24/2019  Editor: Rosetta Posner, MD (Physician)                                        Vascular and Vein Specialist of [facility]  Patient name: [name] [name]     MRN: [id number]        DOB: 09-Jun-1981        Sex: female  REASON FOR CONSULT: Evaluation of skin changes over right brachiocephalic fistula  HPI: [name] [name] is a 38 y.o. female, who is here today for evaluation.  She had had initially placement of a right brachiocephalic fistula with Dr. Scot Dock on 05/23/2016.  She is required no surgical revision since they initial placement.  She does report she has had no vascular treatment at [facility].  She is seen today for concern of some superficial ulceration over the fistula in her upper arm.  Denies any bleeding from this.  She has had ability to have access of her fistula      Past Medical History:  Diagnosis Date  . CHF (congestive heart failure) ([place]) 5-6 yrs  ago  hosp  . Chronic kidney disease   . ERYTHEMATOSUS, LUPUS 08/07/2006  . Hypertension   . Intestinal infection due to Clostridium difficile   . Secondary cardiomyopathy, unspecified   . Sleep apnea    does not wear CPAP  . Systemic lupus erythematosus ([place])   . TIA (transient ischemic attack)   . Unspecified deficiency anemia   . Unspecified essential hypertension          Family History  Problem Relation Age of Onset  . Lupus Mother   . Cancer Maternal Grandmother        unknown  . Prostate cancer Paternal Grandfather   . Hypertension Father   . Diabetes Brother   . Diabetes Other        mat cousin    SOCIAL HISTORY: Social History        Socioeconomic History  . Marital status: Single    Spouse name: Not on file  . Number of children: 0  . Years of education: Not on file  . Highest education level: Not on file  Occupational History  . Occupation: Education administrator   .  Occupation: Scientist, clinical (histocompatibility and immunogenetics): [employer]  Social Needs  . Financial resource strain: Not on file  . Food insecurity    Worry: Not on file    Inability: Not on file  . Transportation needs    Medical: Not on file    Non-medical: Not on file  Tobacco Use  . Smoking status: Never Smoker  . Smokeless tobacco: Never Used  Substance and Sexual Activity  . Alcohol use: No    Alcohol/week: 0.0 standard drinks  . Drug use: No  . Sexual activity: Never  Lifestyle  . Physical activity    Days per week: Not on file    Minutes per session: Not on file  . Stress: Not on file  Relationships  . Social Herbalist on phone: Not on file    Gets together: Not on file    Attends religious service: Not on file    Active member of club or organization: Not on file    Attends meetings of clubs or organizations: Not on file    Relationship status: Not on file  . Intimate partner violence    Fear of current or ex partner: Not on  file    Emotionally abused: Not on file    Physically abused: Not on file    Forced sexual activity: Not on file  Other Topics Concern  . Not on file  Social History Narrative   Lives with sister.             Allergies  Allergen Reactions  . Lisinopril Swelling    SWELLING OF TOP LIP  . Nsaids Other (See Comments)    MD TOLD PATIENT TO AVOID NSAIDS DUE TO RENAL INSUFFICIENY.          Current Outpatient Medications  Medication Sig Dispense Refill  . amLODipine (NORVASC) 10 MG tablet Take 10 mg by mouth every evening.   4  . b complex-vitamin c-folic acid (NEPHRO-VITE) 0.8 MG TABS tablet Take 1 tablet by mouth daily.    . carvedilol (COREG) 25 MG tablet Take 25 mg by mouth 2 (two) times daily.   9  . cinacalcet (SENSIPAR) 30 MG tablet TAKE 1 TABLET BY MOUTH DAILY WITH DINNER (DO NOT TAKE LESS THAN 12 HOURS PRIOR TO DIALYSIS)    . cloNIDine (CATAPRES) 0.1 MG tablet Take 0.1 mg by mouth 2 (two) times daily.  0  . ferric citrate (AURYXIA) 1 GM 210 MG(Fe) tablet Auryxia 210 mg iron tablet    . glycopyrrolate (ROBINUL) 2 MG tablet Take 1 tablet (2 mg total) by mouth 2 (two) times daily. 60 tablet 11  . hydroxychloroquine (PLAQUENIL) 200 MG tablet TAKE 1 TABLET BY MOUTH TWICE A DAY (Patient taking differently: Take 100-200 mg by mouth See admin instructions. Take 1/2 tablet (100 mg) by mouth daily in the morning & take 1 tablet (200 mg) by mouth at night.) 60 tablet 1  . lidocaine-prilocaine (EMLA) cream Apply 1 application topically daily as needed. 30 g 0  . losartan (COZAAR) 100 MG tablet Take 100 mg by mouth every evening.   11  . macitentan (OPSUMIT) 10 MG tablet Take 10 mg by mouth daily.    . ondansetron (ZOFRAN-ODT) 4 MG disintegrating tablet Take 1 tablet (4 mg total) by mouth every 8 (eight) hours as needed for nausea or vomiting. 30 tablet 2  . pantoprazole (PROTONIX) 40 MG tablet TAKE 1 TABLET BY MOUTH EVERY DAY 90 tablet 0  . predniSONE  (  DELTASONE) 10 MG tablet Take 10 mg by mouth at bedtime.   12  . sucroferric oxyhydroxide (VELPHORO) 500 MG chewable tablet Velphoro 500 mg chewable tablet    . triamcinolone ointment (KENALOG) 0.1 % Apply 1 application topically daily as needed (FOR RASH/SKIN IRRITATION.).     No current facility-administered medications for this visit.     REVIEW OF SYSTEMS:  [X]  denotes positive finding, [ ]  denotes negative finding Cardiac  Comments:  Chest pain or chest pressure:    Shortness of breath upon exertion: x   Short of breath when lying flat:    Irregular heart rhythm:        Vascular    Pain in calf, thigh, or hip brought on by ambulation:    Pain in feet at night that wakes you up from your sleep:     Blood clot in your veins:    Leg swelling:         Pulmonary    Oxygen at home:    Productive cough:     Wheezing:         Neurologic    Sudden weakness in arms or legs:     Sudden numbness in arms or legs:     Sudden onset of difficulty speaking or slurred speech:    Temporary loss of vision in one eye:     Problems with dizziness:         Gastrointestinal    Blood in stool:     Vomited blood:         Genitourinary    Burning when urinating:     Blood in urine:        Psychiatric    Major depression:         Hematologic    Bleeding problems:    Problems with blood clotting too easily:        Skin    Rashes or ulcers:        Constitutional    Fever or chills:      PHYSICAL EXAM:    Vitals:   06/24/19 1044  BP: 95/70  Pulse: 86  Resp: 18  Temp: (!) 97.3 F (36.3 C)  SpO2: 100%  Weight: 87.5 kg  Height: 5\' 7"  (1.702 m)    GENERAL: The patient is a well-nourished female, in no acute distress. The vital signs are documented above. CARDIOVASCULAR: Palpable right radial pulse.  Venous aneurysmal change of right upper arm AV fistula.  The more  proximal area of the access site does have some superficial skin breakdown and blistering.  This does not appear to go full-thickness.  The access site around this does not have any evidence of skin breakdown. PULMONARY: There is good air exchange  ABDOMEN: Soft and non-tender  MUSCULOSKELETAL: There are no major deformities or cyanosis. NEUROLOGIC: No focal weakness or paresthesias are detected. SKIN: There are no ulcers or rashes noted. PSYCHIATRIC: The patient has a normal affect.  DATA:  None  MEDICAL ISSUES: Had long discussion with the patient.  I explained that she currently does not have any evidence of full-thickness skin breakdown and would feel comfortable with continued observation.  She does not report any significant pain associated with her access.  She reports that the technicians are able to obtain needle access without difficulty.  I explained that if she is having increasing pain, difficulty for access or more skin breakdown would recommend resection of this area and plication of the vein.  She understands  and wishes continued observation at this time   Rosetta Posner, MD [facility] of [facility] [phone] Pager [phone]       Addendum:  The patient has been re-examined and re-evaluated.  The patient's history and physical has been reviewed and is unchanged.    [name] [name] is a 38 y.o. female is being admitted with END STAGE RENAL DISEASE ON HEMODIALYSIS. All the risks, benefits and other treatment options have been discussed with the patient. The patient has consented to proceed with Procedure(s): REVISON OF ARTERIOVENOUS FISTULA AND PLICATION RIGHT ARM as a surgical intervention.  Corwin Kuiken 08/27/2019 11:09 AM Vascular and Vein Surgery

## 2019-08-28 ENCOUNTER — Encounter (HOSPITAL_COMMUNITY): Payer: Self-pay | Admitting: Vascular Surgery

## 2019-08-28 DIAGNOSIS — I509 Heart failure, unspecified: Secondary | ICD-10-CM | POA: Diagnosis not present

## 2019-08-28 DIAGNOSIS — Z992 Dependence on renal dialysis: Secondary | ICD-10-CM | POA: Diagnosis not present

## 2019-08-28 DIAGNOSIS — Z202 Contact with and (suspected) exposure to infections with a predominantly sexual mode of transmission: Secondary | ICD-10-CM | POA: Diagnosis not present

## 2019-08-28 DIAGNOSIS — D638 Anemia in other chronic diseases classified elsewhere: Secondary | ICD-10-CM | POA: Diagnosis not present

## 2019-08-28 DIAGNOSIS — N898 Other specified noninflammatory disorders of vagina: Secondary | ICD-10-CM | POA: Diagnosis not present

## 2019-08-28 DIAGNOSIS — N83201 Unspecified ovarian cyst, right side: Secondary | ICD-10-CM | POA: Diagnosis not present

## 2019-08-28 DIAGNOSIS — R1909 Other intra-abdominal and pelvic swelling, mass and lump: Secondary | ICD-10-CM | POA: Diagnosis not present

## 2019-08-29 ENCOUNTER — Ambulatory Visit (HOSPITAL_COMMUNITY): Payer: Medicare Other

## 2019-08-29 ENCOUNTER — Other Ambulatory Visit (HOSPITAL_COMMUNITY)
Admission: RE | Admit: 2019-08-29 | Discharge: 2019-08-29 | Disposition: A | Discharge status: Home / Self Care | Payer: Medicare Other | Source: Ambulatory Visit | Attending: Vascular Surgery | Admitting: Vascular Surgery

## 2019-08-29 ENCOUNTER — Other Ambulatory Visit: Payer: Self-pay | Admitting: *Deleted

## 2019-08-29 ENCOUNTER — Telehealth: Payer: Self-pay | Admitting: *Deleted

## 2019-08-29 ENCOUNTER — Encounter (HOSPITAL_COMMUNITY)
Admission: RE | Disposition: A | Discharge status: Home / Self Care | Payer: Self-pay | Source: Ambulatory Visit | Attending: Vascular Surgery

## 2019-08-29 ENCOUNTER — Encounter (HOSPITAL_COMMUNITY): Payer: Self-pay

## 2019-08-29 ENCOUNTER — Ambulatory Visit (HOSPITAL_COMMUNITY): Payer: Medicare Other | Admitting: Certified Registered"

## 2019-08-29 ENCOUNTER — Ambulatory Visit (HOSPITAL_COMMUNITY)
Admission: RE | Admit: 2019-08-29 | Discharge: 2019-08-29 | Disposition: A | Discharge status: Home / Self Care | Payer: Medicare Other | Source: Ambulatory Visit | Attending: Vascular Surgery | Admitting: Vascular Surgery

## 2019-08-29 ENCOUNTER — Other Ambulatory Visit: Payer: Self-pay

## 2019-08-29 DIAGNOSIS — N186 End stage renal disease: Secondary | ICD-10-CM | POA: Insufficient documentation

## 2019-08-29 DIAGNOSIS — Z8249 Family history of ischemic heart disease and other diseases of the circulatory system: Secondary | ICD-10-CM | POA: Diagnosis not present

## 2019-08-29 DIAGNOSIS — I509 Heart failure, unspecified: Secondary | ICD-10-CM | POA: Diagnosis not present

## 2019-08-29 DIAGNOSIS — Z419 Encounter for procedure for purposes other than remedying health state, unspecified: Secondary | ICD-10-CM

## 2019-08-29 DIAGNOSIS — Z888 Allergy status to other drugs, medicaments and biological substances status: Secondary | ICD-10-CM | POA: Insufficient documentation

## 2019-08-29 DIAGNOSIS — I132 Hypertensive heart and chronic kidney disease with heart failure and with stage 5 chronic kidney disease, or end stage renal disease: Secondary | ICD-10-CM | POA: Insufficient documentation

## 2019-08-29 DIAGNOSIS — T8249XA Other complication of vascular dialysis catheter, initial encounter: Secondary | ICD-10-CM | POA: Insufficient documentation

## 2019-08-29 DIAGNOSIS — Z992 Dependence on renal dialysis: Secondary | ICD-10-CM | POA: Diagnosis not present

## 2019-08-29 DIAGNOSIS — Z8673 Personal history of transient ischemic attack (TIA), and cerebral infarction without residual deficits: Secondary | ICD-10-CM | POA: Diagnosis not present

## 2019-08-29 DIAGNOSIS — M329 Systemic lupus erythematosus, unspecified: Secondary | ICD-10-CM | POA: Diagnosis not present

## 2019-08-29 DIAGNOSIS — G4730 Sleep apnea, unspecified: Secondary | ICD-10-CM | POA: Insufficient documentation

## 2019-08-29 DIAGNOSIS — T82868A Thrombosis of vascular prosthetic devices, implants and grafts, initial encounter: Secondary | ICD-10-CM | POA: Diagnosis not present

## 2019-08-29 DIAGNOSIS — Z20828 Contact with and (suspected) exposure to other viral communicable diseases: Secondary | ICD-10-CM | POA: Insufficient documentation

## 2019-08-29 DIAGNOSIS — Z886 Allergy status to analgesic agent status: Secondary | ICD-10-CM | POA: Diagnosis not present

## 2019-08-29 DIAGNOSIS — I517 Cardiomegaly: Secondary | ICD-10-CM | POA: Diagnosis not present

## 2019-08-29 DIAGNOSIS — Y838 Other surgical procedures as the cause of abnormal reaction of the patient, or of later complication, without mention of misadventure at the time of the procedure: Secondary | ICD-10-CM | POA: Insufficient documentation

## 2019-08-29 DIAGNOSIS — Z79899 Other long term (current) drug therapy: Secondary | ICD-10-CM | POA: Diagnosis not present

## 2019-08-29 HISTORY — PX: INSERTION OF DIALYSIS CATHETER: SHX1324

## 2019-08-29 LAB — POCT I-STAT, CHEM 8
BUN: 62 mg/dL — ABNORMAL HIGH (ref 6–20)
Calcium, Ion: 1.01 mmol/L — ABNORMAL LOW (ref 1.15–1.40)
Chloride: 103 mmol/L (ref 98–111)
Creatinine, Ser: 16 mg/dL — ABNORMAL HIGH (ref 0.44–1.00)
Glucose, Bld: 81 mg/dL (ref 70–99)
HCT: 33 % — ABNORMAL LOW (ref 36.0–46.0)
Hemoglobin: 11.2 g/dL — ABNORMAL LOW (ref 12.0–15.0)
Potassium: 6.3 mmol/L (ref 3.5–5.1)
Sodium: 136 mmol/L (ref 135–145)
TCO2: 27 mmol/L (ref 22–32)

## 2019-08-29 LAB — HCG, SERUM, QUALITATIVE: Preg, Serum: NEGATIVE

## 2019-08-29 LAB — SARS CORONAVIRUS 2 (TAT 6-24 HRS): SARS Coronavirus 2: NEGATIVE

## 2019-08-29 SURGERY — INSERTION OF DIALYSIS CATHETER
Anesthesia: General

## 2019-08-29 MED ORDER — PROPOFOL 10 MG/ML IV BOLUS
INTRAVENOUS | Status: AC
  Filled 2019-08-29: qty 20

## 2019-08-29 MED ORDER — SODIUM POLYSTYRENE SULFONATE 15 GM/60ML PO SUSP: 15.0000 g | Freq: Once | ORAL | 0 refills | Status: AC

## 2019-08-29 MED ORDER — LIDOCAINE 2% (20 MG/ML) 5 ML SYRINGE
INTRAMUSCULAR | Status: AC
  Filled 2019-08-29: qty 5

## 2019-08-29 MED ORDER — CHLORHEXIDINE GLUCONATE 4 % EX LIQD: 60.0000 mL | Freq: Once | CUTANEOUS | Status: DC

## 2019-08-29 MED ORDER — DEXAMETHASONE SODIUM PHOSPHATE 4 MG/ML IJ SOLN
INTRAMUSCULAR | Status: DC | PRN
  Administered 2019-08-29: 4 mg via INTRAVENOUS

## 2019-08-29 MED ORDER — ONDANSETRON HCL 4 MG/2ML IJ SOLN
INTRAMUSCULAR | Status: AC
  Filled 2019-08-29: qty 2

## 2019-08-29 MED ORDER — PROMETHAZINE HCL 25 MG/ML IJ SOLN
INTRAMUSCULAR | Status: AC
  Filled 2019-08-29: qty 1

## 2019-08-29 MED ORDER — FENTANYL CITRATE (PF) 250 MCG/5ML IJ SOLN
INTRAMUSCULAR | Status: AC
  Filled 2019-08-29: qty 5

## 2019-08-29 MED ORDER — SODIUM CHLORIDE 0.9 % IV SOLN: INTRAVENOUS | Status: DC

## 2019-08-29 MED ORDER — DEXAMETHASONE SODIUM PHOSPHATE 10 MG/ML IJ SOLN
INTRAMUSCULAR | Status: AC
  Filled 2019-08-29: qty 1

## 2019-08-29 MED ORDER — FENTANYL CITRATE (PF) 100 MCG/2ML IJ SOLN
INTRAMUSCULAR | Status: AC
  Filled 2019-08-29: qty 2

## 2019-08-29 MED ORDER — SODIUM POLYSTYRENE SULFONATE 15 GM/60ML PO SUSP: 30.0000 g | Freq: Once | ORAL | Status: DC

## 2019-08-29 MED ORDER — SODIUM POLYSTYRENE SULFONATE 15 GM/60ML PO SUSP: 15.0000 g | Freq: Once | ORAL | Status: DC

## 2019-08-29 MED ORDER — LIDOCAINE 2% (20 MG/ML) 5 ML SYRINGE
INTRAMUSCULAR | Status: DC | PRN
  Administered 2019-08-29: 100 mg via INTRAVENOUS

## 2019-08-29 MED ORDER — MIDAZOLAM HCL 2 MG/2ML IJ SOLN
INTRAMUSCULAR | Status: AC
  Filled 2019-08-29: qty 2

## 2019-08-29 MED ORDER — ONDANSETRON HCL 4 MG/2ML IJ SOLN
INTRAMUSCULAR | Status: DC | PRN
  Administered 2019-08-29: 4 mg via INTRAVENOUS

## 2019-08-29 MED ORDER — HYDROMORPHONE HCL 1 MG/ML IJ SOLN: 0.2500 mg | INTRAMUSCULAR | Status: DC | PRN

## 2019-08-29 MED ORDER — HYDROCODONE-ACETAMINOPHEN 7.5-325 MG PO TABS: 1.0000 | ORAL_TABLET | Freq: Once | ORAL | Status: DC | PRN

## 2019-08-29 MED ORDER — CEFAZOLIN SODIUM-DEXTROSE 2-4 GM/100ML-% IV SOLN
2.0000 g | INTRAVENOUS | Status: AC
  Administered 2019-08-29: 2 g via INTRAVENOUS
  Filled 2019-08-29: qty 100

## 2019-08-29 MED ORDER — SODIUM CHLORIDE 0.9 % IV SOLN
INTRAVENOUS | Status: AC
  Filled 2019-08-29: qty 1.2

## 2019-08-29 MED ORDER — HEPARIN SODIUM (PORCINE) 1000 UNIT/ML IJ SOLN
INTRAMUSCULAR | Status: AC
  Filled 2019-08-29: qty 1

## 2019-08-29 MED ORDER — PHENYLEPHRINE HCL (PRESSORS) 10 MG/ML IV SOLN
INTRAVENOUS | Status: DC | PRN
  Administered 2019-08-29 (×6): 80 ug via INTRAVENOUS

## 2019-08-29 MED ORDER — PROMETHAZINE HCL 25 MG/ML IJ SOLN: 6.2500 mg | INTRAMUSCULAR | Status: DC | PRN

## 2019-08-29 MED ORDER — ACETAMINOPHEN 10 MG/ML IV SOLN: 1000.0000 mg | Freq: Once | INTRAVENOUS | Status: DC | PRN

## 2019-08-29 MED ORDER — SODIUM CHLORIDE 0.9 % IV SOLN
INTRAVENOUS | Status: DC
  Administered 2019-08-29: 15:00:00 via INTRAVENOUS

## 2019-08-29 MED ORDER — MEPERIDINE HCL 25 MG/ML IJ SOLN: 6.2500 mg | INTRAMUSCULAR | Status: DC | PRN

## 2019-08-29 MED ORDER — PROPOFOL 10 MG/ML IV BOLUS
INTRAVENOUS | Status: DC | PRN
  Administered 2019-08-29: 200 mg via INTRAVENOUS

## 2019-08-29 MED ORDER — FENTANYL CITRATE (PF) 250 MCG/5ML IJ SOLN
INTRAMUSCULAR | Status: DC | PRN
  Administered 2019-08-29 (×6): 50 ug via INTRAVENOUS

## 2019-08-29 MED ORDER — NEOSTIGMINE METHYLSULFATE 3 MG/3ML IV SOSY
PREFILLED_SYRINGE | INTRAVENOUS | Status: AC
  Filled 2019-08-29: qty 3

## 2019-08-29 MED ORDER — LIDOCAINE-EPINEPHRINE 0.5 %-1:200000 IJ SOLN
INTRAMUSCULAR | Status: AC
  Filled 2019-08-29: qty 1

## 2019-08-29 SURGICAL SUPPLY — 46 items
BAG DECANTER FOR FLEXI CONT (MISCELLANEOUS) ×3 IMPLANT
BIOPATCH RED 1 DISK 7.0 (GAUZE/BANDAGES/DRESSINGS) ×2 IMPLANT
BIOPATCH RED 1IN DISK 7.0MM (GAUZE/BANDAGES/DRESSINGS) ×1
CATH PALINDROME RT-P 15FX19CM (CATHETERS) IMPLANT
CATH PALINDROME RT-P 15FX23CM (CATHETERS) ×3 IMPLANT
CATH PALINDROME RT-P 15FX28CM (CATHETERS) IMPLANT
CATH PALINDROME RT-P 15FX55CM (CATHETERS) IMPLANT
CATH SOFT-VU 4F 65 STRAIGHT (CATHETERS) ×1 IMPLANT
CATH SOFT-VU STRAIGHT 4F 65CM (CATHETERS) ×2
COVER PROBE W GEL 5X96 (DRAPES) ×3 IMPLANT
COVER SURGICAL LIGHT HANDLE (MISCELLANEOUS) ×3 IMPLANT
COVER WAND RF STERILE (DRAPES) ×3 IMPLANT
DECANTER SPIKE VIAL GLASS SM (MISCELLANEOUS) ×3 IMPLANT
DERMABOND ADHESIVE PROPEN (GAUZE/BANDAGES/DRESSINGS) ×2
DERMABOND ADVANCED (GAUZE/BANDAGES/DRESSINGS) ×2
DERMABOND ADVANCED .7 DNX12 (GAUZE/BANDAGES/DRESSINGS) ×1 IMPLANT
DERMABOND ADVANCED .7 DNX6 (GAUZE/BANDAGES/DRESSINGS) ×1 IMPLANT
DEVICE TORQUE KENDALL .025-038 (MISCELLANEOUS) ×3 IMPLANT
DRAPE C-ARM 42X72 X-RAY (DRAPES) ×3 IMPLANT
DRAPE CHEST BREAST 15X10 FENES (DRAPES) ×3 IMPLANT
GAUZE 4X4 16PLY RFD (DISPOSABLE) ×3 IMPLANT
GLOVE SS BIOGEL STRL SZ 7.5 (GLOVE) ×1 IMPLANT
GLOVE SUPERSENSE BIOGEL SZ 7.5 (GLOVE) ×2
GOWN STRL REUS W/ TWL LRG LVL3 (GOWN DISPOSABLE) ×2 IMPLANT
GOWN STRL REUS W/TWL LRG LVL3 (GOWN DISPOSABLE) ×4
GUIDEWIRE ANGLED .035X260CM (WIRE) ×3 IMPLANT
KIT BASIN OR (CUSTOM PROCEDURE TRAY) ×3 IMPLANT
KIT TURNOVER KIT B (KITS) ×3 IMPLANT
NEEDLE 18GX1X1/2 (RX/OR ONLY) (NEEDLE) ×3 IMPLANT
NEEDLE 22X1 1/2 (OR ONLY) (NEEDLE) IMPLANT
NEEDLE HYPO 25GX1X1/2 BEV (NEEDLE) ×3 IMPLANT
NS IRRIG 1000ML POUR BTL (IV SOLUTION) ×3 IMPLANT
PACK SURGICAL SETUP 50X90 (CUSTOM PROCEDURE TRAY) ×3 IMPLANT
PAD ARMBOARD 7.5X6 YLW CONV (MISCELLANEOUS) ×6 IMPLANT
SET MICROPUNCTURE 5F STIFF (MISCELLANEOUS) ×3 IMPLANT
SOAP 2 % CHG 4 OZ (WOUND CARE) ×3 IMPLANT
SUT ETHILON 3 0 PS 1 (SUTURE) ×3 IMPLANT
SUT VICRYL 4-0 PS2 18IN ABS (SUTURE) ×3 IMPLANT
SYR 10ML LL (SYRINGE) ×3 IMPLANT
SYR 20ML LL LF (SYRINGE) ×3 IMPLANT
SYR 5ML LL (SYRINGE) ×6 IMPLANT
SYR CONTROL 10ML LL (SYRINGE) ×3 IMPLANT
TOWEL GREEN STERILE (TOWEL DISPOSABLE) ×6 IMPLANT
TOWEL GREEN STERILE FF (TOWEL DISPOSABLE) ×3 IMPLANT
WATER STERILE IRR 1000ML POUR (IV SOLUTION) ×3 IMPLANT
WIRE TORQFLEX AUST .018X40CM (WIRE) ×3 IMPLANT

## 2019-08-29 NOTE — Telephone Encounter (Signed)
Spoke with AMY at [facility] of patient's pending [place] today at [facility] and that patient was instructed to call them after surgery for further instructions regarding HD.

## 2019-08-29 NOTE — Telephone Encounter (Signed)
Called update to patient. [place] to get call back from doctor on call with instructions and remain NPO may take meds with sips of water.

## 2019-08-29 NOTE — Anesthesia Postprocedure Evaluation (Signed)
Anesthesia Post Note  Patient: [name] [name]  Procedure(s) Performed: INSERTION OF TUNNEL DIALYSIS CATHETER (N/A )     Patient location during evaluation: PACU Anesthesia Type: General Level of consciousness: awake and alert Pain management: pain level controlled Vital Signs Assessment: post-procedure vital signs reviewed and stable Respiratory status: spontaneous breathing, nonlabored ventilation, respiratory function stable and patient connected to nasal cannula oxygen Cardiovascular status: blood pressure returned to baseline and stable Postop Assessment: no apparent nausea or vomiting Anesthetic complications: no    Last Vitals:  Vitals:   08/29/19 1409  BP: 130/89  Pulse: 89  Resp: 20  Temp: 36.6 C  SpO2: 100%    Last Pain:  Vitals:   08/29/19 1412  TempSrc:   PainSc: 5                  Barnet Glasgow

## 2019-08-29 NOTE — Anesthesia Procedure Notes (Signed)
Procedure Name: LMA Insertion Date/Time: 08/29/2019 3:09 PM Performed by: Amadeo Garnet, CRNA Pre-anesthesia Checklist: Patient identified, Emergency Drugs available, Suction available, Patient being monitored and Timeout performed Patient Re-evaluated:Patient Re-evaluated prior to induction Oxygen Delivery Method: Circle system utilized Preoxygenation: Pre-oxygenation with 100% oxygen Induction Type: IV induction Ventilation: Mask ventilation without difficulty LMA: LMA inserted LMA Size: 4.0 Number of attempts: 1 Placement Confirmation: positive ETCO2 and breath sounds checked- equal and bilateral Tube secured with: Tape Dental Injury: Teeth and Oropharynx as per pre-operative assessment

## 2019-08-29 NOTE — Telephone Encounter (Signed)
Call to patient at 9:15 this am  and instructed to remain NPO for possible procedure later today for HD access. I will call her back with instructions after speaking with doctor on call.  Patient states she had a full meal at 7:00 am. Joycelyn Schmid at [facility] stated she instructed patient to be NPO after unable to have treatment at 6 am today due to clotted access. Page sent out to Dr. Donnetta Hutching and to PA's to call me back to give patient instructions.

## 2019-08-29 NOTE — Interval H&P Note (Signed)
History and Physical Interval Note:  08/29/2019 2:37 PM  [name] [name]  has presented today for surgery, with the diagnosis of clotted catheter.  The various methods of treatment have been discussed with the patient and family. After consideration of risks, benefits and other options for treatment, the patient has consented to  Procedure(s): INSERTION OF TUNNEL DIALYSIS CATHETER (N/A) as a surgical intervention.  The patient's history has been reviewed, patient examined, no change in status, stable for surgery.  I have reviewed the patient's chart and labs.  Questions were answered to the patient's satisfaction.     Deitra Mayo

## 2019-08-29 NOTE — Discharge Instructions (Addendum)
Vascular Access for Hemodialysis        A vascular access is a connection to the blood inside your blood vessels that allows blood to be easily removed from your body and returned to your body during kidney dialysis (hemodialysis). Hemodialysis is a procedure in which a machine outside of the body filters the blood of a person whose kidneys are no longer working properly. There are three types of vascular accesses:  Arteriovenous fistula (AVF). This is a connection between an artery and a vein (usually in the arm) that is made by sewing them together. Blood in the artery flows directly into the vein, causing it to get larger over time. This makes it easier for the vein to be used for hemodialysis. An arteriovenous fistula takes 1-6 months to develop after surgery.  Arteriovenous graft (AVG). This is a connection between an artery and a vein in the arm that is made with a tube. An arteriovenous graft can be used within 2-3 weeks of surgery.  Venous catheter. This is a thin, flexible tube that is placed in a large vein (usually in the neck, chest, or groin). A venous catheter for hemodialysis contains two tubes that come out of the skin. A venous catheter can be used right away. It is usually used as a temporary access if you need hemodialysis before a fistula or graft has developed, or if kidney failure is sudden (acute) and likely to improve without the need for long-term dialysis. It may also be used as a permanent access if a fistula or graft cannot be created. Which type of access is best for me? The type of access that is best for you depends on the size and strength of your veins, your age, and any other health problems that you have, such as diabetes. An ultrasound test may be used to look at your veins to help make this decision. A fistula is usually the preferred type of access. It can last several years and is less likely than the other types of accesses to become infected or to cause a blood  clot within a blood vessel (thrombosis). However, a fistula is not an option for everyone. If your veins are not the right size or if the fistula does not develop properly, a graft may be used instead. Grafts require you to have strong veins. If your veins are not strong enough for a graft, a catheter may be used. Catheters are more likely than fistulas and grafts to become infected or to have a thrombosis. Sometimes, only one type of access is an option. Your health care provider will help you determine which type of access is best for you. How is a vascular access used? The way that the access is used depends on the type of access:  If the access is a fistula or graft, two needles are inserted through the skin into the access before each hemodialysis session. Blood leaves the body through one of the needles and travels through a tube to the hemodialysis machine (dialyzer). Then it flows through another tube and returns to the body through the second needle.  If the access is a catheter, one tube is connected directly to the tube that leads to the dialyzer, and the other tube is connected to a tube that leads away from the dialyzer. Blood leaves the body through one tube and returns to the body through the other. What problems can occur with vascular access?  A blood clot within a blood vessel (thrombosis).   Thrombosis can lead to a narrowing of a blood vessel (stenosis). If thrombosis occurs frequently, another access site may be created as a backup.  Infection.  Heart enlargement (cardiomegaly) and heart failure. Changes in blood flow may cause an increase in blood pressure or heart rate, making your heart work harder to pump blood. These problems are most likely to occur with a venous catheter and least likely to occur with an arteriovenous fistula. How do I care for my vascular access?  Wear a medical alert bracelet. In case of an emergency, this bracelet tells health care providers that you  are a dialysis patient and allows them to care for your veins appropriately. If you have a graft or fistula:  A "bruit" is a noise that is heard with a stethoscope, and a "thrill" is a vibration that is felt over the graft or fistula. The presence of the bruit and thrill indicates that the access is working. You will be taught to feel for the thrill each day. If this is not felt, the access may be clotted. Call your health care provider.  Keep your arm straight and raised (elevated) above your heart while the access site is healing.  You may freely use the arm where your vascular access is located after the site heals. Keep the following in mind: ? Avoid pressure on the arm. ? Avoid lifting heavy objects with the arm. ? Avoid sleeping on the arm. ? Avoid wearing tight-sleeved shirts or jewelry around the graft or fistula.  Do not allow blood pressure monitoring or needle punctures on the side where the graft or fistula is located.  With permission from your health care provider, you may do exercises to help with blood flow through a fistula. These exercises involve squeezing a rubber ball or other soft objects as instructed.  Wash your access site according to directions from your health care provider. If you have a venous catheter:  Keep the insertion site clean and dry at all times.  If you are told you can shower after the site heals, use a protective covering over the catheter to keep it dry.  Follow directions from your health care provider for bandage (dressing) changes.  Ask your health care provider what activities are safe for you. You may be restricted from lifting or making repetitive arm movements on the side with the catheter. Contact a health care provider if:  Swelling around the graft or fistula gets worse.  You develop new pain.  Your catheter gets damaged. Get help right away if:  You have pain, numbness, an unusual pale skin color, or blue fingers or sores at  the tips of your fingers in the hand on the side of your fistula.  You have chills.  You have a fever.  You have pus or other fluid (drainage) at the vascular access site.  You develop skin redness or red streaking on the skin around, above, or below the vascular access.  You have bleeding at the vascular access that cannot be easily controlled.  Your catheter gets pulled out of place.  You feel your heart racing or skipping beats.  You have chest pain. Summary  A vascular access is a connection to the blood inside your blood vessels that allows blood to be easily removed from your body and returned to your body during kidney dialysis (hemodialysis).  There are three types of vascular accesses.The type of access that is best for you depends on the size and strength of your   veins, your age, and any other health problems that you have, such as diabetes. °· A fistula is usually the preferred type of access, although it is not an option for everyone. It can last several years and is less likely than the other types of accesses to become infected or to cause a blood clot within a blood vessel (thrombosis). °· Wear a medical alert bracelet. In case of an emergency, this tells health care providers that you are a dialysis patient. °This information is not intended to replace advice given to you by your health care provider. Make sure you discuss any questions you have with your health care provider. °Document Released: 12/30/2002 Document Revised: 01/28/2019 Document Reviewed: 11/03/2016 °Elsevier Patient Education © 2020 [employer] ° °

## 2019-08-29 NOTE — Anesthesia Preprocedure Evaluation (Addendum)
Anesthesia Evaluation  Patient identified by MRN, date of birth, ID band Patient awake    Reviewed: Allergy & Precautions, NPO status , Patient's Chart, lab work & pertinent test results  Airway Mallampati: II  TM Distance: >3 FB Neck ROM: Full    Dental no notable dental hx. (+) Teeth Intact   Pulmonary sleep apnea and Continuous Positive Airway Pressure Ventilation ,    Pulmonary exam normal breath sounds clear to auscultation       Cardiovascular hypertension, Pt. on medications +CHF and + DOE  Normal cardiovascular exam Rhythm:Regular Rate:Normal     Neuro/Psych TIAnegative psych ROS   GI/Hepatic Neg liver ROS, GERD  Medicated,  Endo/Other    Renal/GU DialysisRenal diseaseK+ 6.3  Nl MWF dialysis     Musculoskeletal negative musculoskeletal ROS (+)   Abdominal   Peds  Hematology  (+) anemia , Hgb 11.2   Anesthesia Other Findings   Reproductive/Obstetrics negative OB ROS (+) Pregnancy                            Anesthesia Physical Anesthesia Plan  ASA: IV  Anesthesia Plan: General   Post-op Pain Management:    Induction: Intravenous  PONV Risk Score and Plan: 3 and Treatment may vary due to age or medical condition, Ondansetron and Dexamethasone  Airway Management Planned: LMA  Additional Equipment: None  Intra-op Plan:   Post-operative Plan:   Informed Consent: I have reviewed the patients History and Physical, chart, labs and discussed the procedure including the risks, benefits and alternatives for the proposed anesthesia with the patient or authorized representative who has indicated his/her understanding and acceptance.     Dental advisory given  Plan Discussed with: CRNA  Anesthesia Plan Comments:        Anesthesia Quick Evaluation

## 2019-08-29 NOTE — Transfer of Care (Signed)
Immediate Anesthesia Transfer of Care Note  Patient: [name] [name]  Procedure(s) Performed: INSERTION OF TUNNEL DIALYSIS CATHETER (N/A )  Patient Location: PACU  Anesthesia Type:General  Level of Consciousness: awake, alert  and oriented  Airway & Oxygen Therapy: Patient Spontanous Breathing  Post-op Assessment: Report given to RN, Post -op Vital signs reviewed and stable and Patient moving all extremities  Post vital signs: Reviewed and stable  Last Vitals:  Vitals Value Taken Time  BP 133/87 08/29/19 1612  Temp    Pulse 94 08/29/19 1613  Resp 15 08/29/19 1614  SpO2 100 % 08/29/19 1613  Vitals shown include unvalidated device data.  Last Pain:  Vitals:   08/29/19 1412  TempSrc:   PainSc: 5       Patients Stated Pain Goal: 3 (83/33/83 2919)  Complications: No apparent anesthesia complications

## 2019-08-29 NOTE — Progress Notes (Signed)
Spoke with Buffy at [facility] OR to book add on surgery today for [facility] with Dr. Donnetta Hutching. Patient stated NPO since 7 am today.  Patient will need pre-op nasal swab per anesthesia.  Patient called and instructed to leave now to go to [facility] for nasal swab for Covid-19. To get in the pre-op lane. Then report to [facility] admitting. Will need a driver for discharge to home. Verbalized understanding.

## 2019-08-29 NOTE — Op Note (Signed)
    NAME: [name] [name]    MRN: [id number] DOB: 1981/04/12    DATE OF OPERATION: 08/29/2019  PREOP DIAGNOSIS:    End-stage renal disease  POSTOP DIAGNOSIS:    Same  PROCEDURE:    Ultrasound-guided placement of right IJ 23 cm tunneled dialysis catheter  SURGEON: Judeth Cornfield. Scot Dock, MD  ASSIST: None  ANESTHESIA: General  EBL: Minimal  INDICATIONS:    [name] [name] is a 38 y.o. female who has a right arm access which is thrombosed and I was asked to place a tunneled dialysis catheter.  FINDINGS:   She had evidence of significant collaterals in both sides of her neck suggesting possible IJ stenoses or occlusions.  TECHNIQUE:   The patient was taken to the operating room and received a general anesthetic.  The neck and upper chest were prepped and draped in usual sterile fashion.  By duplex there appeared to be large collaterals on both sides of the neck.  On the left side there was one more prominent vein.  After the skin was anesthetized under ultrasound guidance I cannulated this with a micropuncture needle but was unable to thread a wire suggesting an occlusion.  I therefore went to the right side.  On the right side there were multiple vein suggesting collaterals I selected what I felt was most likely the IJ and cannulated this with a micropuncture needle under ultrasound guidance.  The wire would not advance but I was able to get the sheath in enough that I could use an angled Glidewire and ultimately I was able to manipulate this into the right atrium.  I then exchanged this wire for the J-wire over a straight catheter.  The tract over this wire was dilated and then the dilator and peel-away sheath were advanced over the wire and the dilator removed.  The catheter was threaded over the wire through the peel-away sheath into the right atrium and the peel-away sheath and wire were then removed.  The exit site for the catheter was selected and the skin anesthetized  between the 2 areas the catheter was brought through the tunnel cut the appropriate length and the distal ports were attached.  Both ports withdrew easily with and flushed with heparinized saline and filled with concentrated heparin.  The catheter was secured at its exit site with a 3-0 nylon suture.  The IJ cannulation site was closed with a 4-0 subcuticular stitch.  Sterile dressing was applied.  The patient tolerated procedure well was transferred to recovery room in stable condition.  All needle and sponge counts were correct.  Deitra Mayo, MD, FACS [facility]  DATE OF DICTATION:   08/29/2019

## 2019-08-30 ENCOUNTER — Encounter (HOSPITAL_COMMUNITY): Payer: Self-pay | Admitting: Vascular Surgery

## 2019-08-30 DIAGNOSIS — N2581 Secondary hyperparathyroidism of renal origin: Secondary | ICD-10-CM | POA: Diagnosis not present

## 2019-08-30 DIAGNOSIS — D509 Iron deficiency anemia, unspecified: Secondary | ICD-10-CM | POA: Diagnosis not present

## 2019-08-30 DIAGNOSIS — N186 End stage renal disease: Secondary | ICD-10-CM | POA: Diagnosis not present

## 2019-08-30 DIAGNOSIS — Z992 Dependence on renal dialysis: Secondary | ICD-10-CM | POA: Diagnosis not present

## 2019-08-30 DIAGNOSIS — D631 Anemia in chronic kidney disease: Secondary | ICD-10-CM | POA: Diagnosis not present

## 2019-09-01 ENCOUNTER — Telehealth: Payer: Self-pay | Admitting: Vascular Surgery

## 2019-09-01 ENCOUNTER — Other Ambulatory Visit: Payer: Self-pay | Admitting: *Deleted

## 2019-09-01 DIAGNOSIS — Z992 Dependence on renal dialysis: Secondary | ICD-10-CM | POA: Diagnosis not present

## 2019-09-01 DIAGNOSIS — N2581 Secondary hyperparathyroidism of renal origin: Secondary | ICD-10-CM | POA: Diagnosis not present

## 2019-09-01 DIAGNOSIS — N186 End stage renal disease: Secondary | ICD-10-CM | POA: Diagnosis not present

## 2019-09-01 DIAGNOSIS — D631 Anemia in chronic kidney disease: Secondary | ICD-10-CM | POA: Diagnosis not present

## 2019-09-01 DIAGNOSIS — D509 Iron deficiency anemia, unspecified: Secondary | ICD-10-CM | POA: Diagnosis not present

## 2019-09-01 NOTE — Telephone Encounter (Signed)
Spoke with patient by phone.  She had a plication of her.  She subsequently had placement of a tunneled dialysis catheter on August 29, 2019 by Dr. Scot Dock.  She apparently had a plication of her AV fistula on the right arm by Dr. Donnetta Hutching on November 4 but I am unable to find operative note regarding this.  Today she complains of pain a few inches above her incision.  She states she has some swelling in that area.  It has been about the same for the last several days.  In discussions with her her pain was the thing that bothers her the most.  In light of this I do not believe that she has an expanding hematoma.  The fistula apparently is occluded so she be should be at real risk of bleeding.  The patient is scheduled for bilateral central venogram by Dr. Trula Slade tomorrow.  I discussed with her that Dr. Trula Slade could look at the fistula tomorrow and see if she needs any further pain medication or intervention at that time.  Ruta Hinds, MD [facility] [place] Office: [phone]

## 2019-09-02 ENCOUNTER — Other Ambulatory Visit: Payer: Self-pay

## 2019-09-02 ENCOUNTER — Ambulatory Visit (HOSPITAL_BASED_OUTPATIENT_CLINIC_OR_DEPARTMENT_OTHER): Payer: Medicare Other

## 2019-09-02 ENCOUNTER — Ambulatory Visit (HOSPITAL_COMMUNITY)
Admission: RE | Admit: 2019-09-02 | Discharge: 2019-09-02 | Disposition: A | Discharge status: Home / Self Care | Payer: Medicare Other | Attending: Surgery | Admitting: Surgery

## 2019-09-02 ENCOUNTER — Encounter (HOSPITAL_COMMUNITY)
Admission: RE | Disposition: A | Discharge status: Home / Self Care | Payer: Self-pay | Source: Home / Self Care | Attending: Surgery

## 2019-09-02 ENCOUNTER — Encounter (HOSPITAL_COMMUNITY): Payer: Self-pay | Admitting: Surgery

## 2019-09-02 DIAGNOSIS — N186 End stage renal disease: Secondary | ICD-10-CM | POA: Diagnosis not present

## 2019-09-02 DIAGNOSIS — T82510A Breakdown (mechanical) of surgically created arteriovenous fistula, initial encounter: Secondary | ICD-10-CM | POA: Diagnosis not present

## 2019-09-02 DIAGNOSIS — Z992 Dependence on renal dialysis: Secondary | ICD-10-CM | POA: Insufficient documentation

## 2019-09-02 DIAGNOSIS — Y841 Kidney dialysis as the cause of abnormal reaction of the patient, or of later complication, without mention of misadventure at the time of the procedure: Secondary | ICD-10-CM | POA: Diagnosis not present

## 2019-09-02 HISTORY — PX: UPPER EXTREMITY VENOGRAPHY: CATH118272

## 2019-09-02 LAB — HCG, SERUM, QUALITATIVE: Preg, Serum: NEGATIVE

## 2019-09-02 LAB — POCT I-STAT, CHEM 8
BUN: 22 mg/dL — ABNORMAL HIGH (ref 6–20)
Calcium, Ion: 1.18 mmol/L (ref 1.15–1.40)
Chloride: 95 mmol/L — ABNORMAL LOW (ref 98–111)
Creatinine, Ser: 9.9 mg/dL — ABNORMAL HIGH (ref 0.44–1.00)
Glucose, Bld: 87 mg/dL (ref 70–99)
HCT: 31 % — ABNORMAL LOW (ref 36.0–46.0)
Hemoglobin: 10.5 g/dL — ABNORMAL LOW (ref 12.0–15.0)
Potassium: 3.9 mmol/L (ref 3.5–5.1)
Sodium: 137 mmol/L (ref 135–145)
TCO2: 32 mmol/L (ref 22–32)

## 2019-09-02 SURGERY — UPPER EXTREMITY VENOGRAPHY
Anesthesia: LOCAL | Laterality: Bilateral

## 2019-09-02 MED ORDER — SODIUM CHLORIDE 0.9 % IV SOLN: 250.0000 mL | INTRAVENOUS | Status: DC | PRN

## 2019-09-02 MED ORDER — SODIUM CHLORIDE 0.9% FLUSH: 3.0000 mL | INTRAVENOUS | Status: DC | PRN

## 2019-09-02 MED ORDER — IODIXANOL 320 MG/ML IV SOLN
INTRAVENOUS | Status: DC | PRN
  Administered 2019-09-02: 10:00:00 20 mL via INTRAVENOUS

## 2019-09-02 MED ORDER — SODIUM CHLORIDE 0.9% FLUSH: 3.0000 mL | Freq: Two times a day (BID) | INTRAVENOUS | Status: DC

## 2019-09-02 SURGICAL SUPPLY — 2 items
STOPCOCK MORSE 400PSI 3WAY (MISCELLANEOUS) ×2 IMPLANT
TRANSDUCER W/STOPCOCK (MISCELLANEOUS) ×2 IMPLANT

## 2019-09-02 NOTE — Discharge Instructions (Signed)
Venogram, Care After °This sheet gives you information about how to care for yourself after your procedure. Your health care provider may also give you more specific instructions. If you have problems or questions, contact your health care provider. °What can I expect after the procedure? °After the procedure, it is common to have: °· Bruising or mild discomfort in the area where the IV was inserted (insertion site). °Follow these instructions at home: °Eating and drinking ° °· Follow instructions from your health care provider about eating or drinking restrictions. °· Drink a lot of fluids for the first several days after the procedure, as directed by your health care provider. This helps to wash (flush) the contrast out of your body. Examples of healthy fluids include water or low-calorie drinks. °General instructions °· Check your IV insertion area every day for signs of infection. Check for: °? Redness, swelling, or pain. °? Fluid or blood. °? Warmth. °? Pus or a bad smell. °· Take over-the-counter and prescription medicines only as told by your health care provider. °· Rest and return to your normal activities as told by your health care provider. Ask your health care provider what activities are safe for you. °· Do not drive for 24 hours if you were given a medicine to help you relax (sedative), or until your health care provider approves. °· Keep all follow-up visits as told by your health care provider. This is important. °Contact a health care provider if: °· Your skin becomes itchy or you develop a rash or hives. °· You have a fever that does not get better with medicine. °· You feel nauseous. °· You vomit. °· You have redness, swelling, or pain around the insertion site. °· You have fluid or blood coming from the insertion site. °· Your insertion area feels warm to the touch. °· You have pus or a bad smell coming from the insertion site. °Get help right away if: °· You have difficulty breathing or  shortness of breath. °· You develop chest pain. °· You faint. °· You feel very dizzy. °These symptoms may represent a serious problem that is an emergency. Do not wait to see if the symptoms will go away. Get medical help right away. Call your local emergency services (911 in the [place] Do not drive yourself to the hospital. °Summary °· After your procedure, it is common to have bruising or mild discomfort in the area where the IV was inserted. °· You should check your IV insertion area every day for signs of infection. °· Take over-the-counter and prescription medicines only as told by your health care provider. °· You should drink a lot of fluids for the first several days after the procedure to help flush the contrast from your body. °This information is not intended to replace advice given to you by your health care provider. Make sure you discuss any questions you have with your health care provider. °Document Released: 07/30/2013 Document Revised: 09/21/2017 Document Reviewed: 09/02/2016 °Elsevier Patient Education © 2020 [employer] ° °

## 2019-09-02 NOTE — Progress Notes (Signed)
UE vein mapping       has been completed. Preliminary results can be found under CV proc through chart review. Makhiya Coburn, BS, RDMS, RVT   

## 2019-09-02 NOTE — Op Note (Signed)
    Patient name: [name] [name] MRN: [id number] DOB: 05/23/1981 Sex: female  09/02/2019 Pre-operative Diagnosis: ESRD Post-operative diagnosis:  Same Surgeon:  Annamarie Major Procedure Performed:  1.  Bilateral central venogram    Indications:  Patient is in need of new access  Procedure:  The patient was identified in the holding area and taken to room 8.  The patient was then placed supine on the table and prepped and draped in the usual sterile fashion.  A time out was called.  Ultrasound was used to evaluate the fistula.  The vein was patent and compressible.  A digital ultrasound image was acquired.  The fistula was then accessed under ultrasound guidance using a micropuncture needle.  An 018 wire was then asvanced without resistance and a micropuncture sheath was placed.  Contrast injections were then performed through the sheath.  Findings:  Right brachial and axillary veins are patent, however there is occlusion of the subclavian vein and Innominate vein.   The left central venous system is patent without stenosis   Intervention:  non  Impression:  #1  Occluded right central venous system  #2  Patent right central venous system  #3  Patient will get vein mappnig in recovery room and will plan for new left sided access    V. Annamarie Major, M.D., [facility] [place] Office: [phone] Pager:  [phone]

## 2019-09-02 NOTE — H&P (Signed)
   Patient name: [name] [name] MRN: [id number] DOB: 1981/06/05 Sex: female    HISTORY OF PRESENT ILLNESS:   [name] [name] is a 38 y.o. female with occluded right arm fistula in need of new access.    CURRENT MEDICATIONS:    Current Facility-Administered Medications  Medication Dose Route Frequency Provider Last Rate Last Dose  . 0.9 %  sodium chloride infusion  250 mL Intravenous PRN Serafina Mitchell, MD      . sodium chloride flush (NS) 0.9 % injection 3 mL  3 mL Intravenous Q12H Serafina Mitchell, MD      . sodium chloride flush (NS) 0.9 % injection 3 mL  3 mL Intravenous PRN Serafina Mitchell, MD        REVIEW OF SYSTEMS:   [X]  denotes positive finding, [ ]  denotes negative finding Cardiac  Comments:  Chest pain or chest pressure:    Shortness of breath upon exertion:    Short of breath when lying flat:    Irregular heart rhythm:    Constitutional    Fever or chills:      PHYSICAL EXAM:   Vitals:   09/02/19 0624  BP: 110/78  Pulse: (!) 103  Resp: 16  Temp: (!) 97.4 F (36.3 C)  TempSrc: Skin  SpO2: 100%  Weight: 86.2 kg  Height: 5\' 7"  (1.702 m)    GENERAL: The patient is a well-nourished female, in no acute distress. The vital signs are documented above. CARDIOVASCULAR: There is a regular rate and rhythm. PULMONARY: Non-labored respirations   STUDIES:      MEDICAL ISSUES:   Plan for bilateral venogram for access planning  Leia Alf, MD, FACS [facility] [phone] Pager [phone]

## 2019-09-03 DIAGNOSIS — N186 End stage renal disease: Secondary | ICD-10-CM | POA: Diagnosis not present

## 2019-09-03 DIAGNOSIS — D509 Iron deficiency anemia, unspecified: Secondary | ICD-10-CM | POA: Diagnosis not present

## 2019-09-03 DIAGNOSIS — D631 Anemia in chronic kidney disease: Secondary | ICD-10-CM | POA: Diagnosis not present

## 2019-09-03 DIAGNOSIS — Z992 Dependence on renal dialysis: Secondary | ICD-10-CM | POA: Diagnosis not present

## 2019-09-03 DIAGNOSIS — N2581 Secondary hyperparathyroidism of renal origin: Secondary | ICD-10-CM | POA: Diagnosis not present

## 2019-09-03 NOTE — Op Note (Signed)
    OPERATIVE REPORT  DATE OF SURGERY: 09/03/2019  PATIENT: [name] [name], 38 y.o. 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  PHYSICIAN ASSISTANT: Laurence Slate, PA-C  ANESTHESIA: General  EBL: per anesthesia record  No intake/output data recorded.  BLOOD ADMINISTERED: none  DRAINS: none  SPECIMEN: none  COUNTS CORRECT:  YES  PATIENT DISPOSITION:  PACU - hemodynamically stable  PROCEDURE DETAILS: Patient was taken up and placed supine position where the area of the right upper arm was prepped and draped in usual sterile fashion.  The patient had diffuse aneurysmal change of her right brachiocephalic fistula.  There was an area with thinning of the skin and hypopigmentation and also tenderness in the midportion.  Incision was made over this area and an ellipse of the fragile skin was removed.  The vein was mobilized circumferentially above and below this.  The vein was opened and there was some mural thrombus present in the vein.  This was resected and the vein size was brought to a plastic aneurysmal portion.  The vein was closed with a running 5-0 Prolene in 2 layers.  Clamps were removed and good thrill was noted through the fistula.  Wounds were irrigated with saline..  Hemostasis obtained electrocautery.  The wounds were closed with 3-0 Vicryl in the subcutaneous and subcuticular tissue.  Sterile dressing was applied   [name] [name], M.D., [facility] 09/03/2019 1:15 PM

## 2019-09-04 DIAGNOSIS — N184 Chronic kidney disease, stage 4 (severe): Secondary | ICD-10-CM | POA: Diagnosis not present

## 2019-09-04 DIAGNOSIS — I7300 Raynaud's syndrome without gangrene: Secondary | ICD-10-CM | POA: Diagnosis not present

## 2019-09-04 DIAGNOSIS — M329 Systemic lupus erythematosus, unspecified: Secondary | ICD-10-CM | POA: Diagnosis not present

## 2019-09-04 DIAGNOSIS — M3214 Glomerular disease in systemic lupus erythematosus: Secondary | ICD-10-CM | POA: Diagnosis not present

## 2019-09-04 DIAGNOSIS — R21 Rash and other nonspecific skin eruption: Secondary | ICD-10-CM | POA: Diagnosis not present

## 2019-09-04 DIAGNOSIS — E669 Obesity, unspecified: Secondary | ICD-10-CM | POA: Diagnosis not present

## 2019-09-04 DIAGNOSIS — Z6830 Body mass index (BMI) 30.0-30.9, adult: Secondary | ICD-10-CM | POA: Diagnosis not present

## 2019-09-04 DIAGNOSIS — I270 Primary pulmonary hypertension: Secondary | ICD-10-CM | POA: Diagnosis not present

## 2019-09-04 DIAGNOSIS — Z79899 Other long term (current) drug therapy: Secondary | ICD-10-CM | POA: Diagnosis not present

## 2019-09-04 DIAGNOSIS — M064 Inflammatory polyarthropathy: Secondary | ICD-10-CM | POA: Diagnosis not present

## 2019-09-04 DIAGNOSIS — M1A09X0 Idiopathic chronic gout, multiple sites, without tophus (tophi): Secondary | ICD-10-CM | POA: Diagnosis not present

## 2019-09-05 DIAGNOSIS — Z992 Dependence on renal dialysis: Secondary | ICD-10-CM | POA: Diagnosis not present

## 2019-09-05 DIAGNOSIS — N186 End stage renal disease: Secondary | ICD-10-CM | POA: Diagnosis not present

## 2019-09-05 DIAGNOSIS — D631 Anemia in chronic kidney disease: Secondary | ICD-10-CM | POA: Diagnosis not present

## 2019-09-05 DIAGNOSIS — N2581 Secondary hyperparathyroidism of renal origin: Secondary | ICD-10-CM | POA: Diagnosis not present

## 2019-09-05 DIAGNOSIS — D509 Iron deficiency anemia, unspecified: Secondary | ICD-10-CM | POA: Diagnosis not present

## 2019-09-08 DIAGNOSIS — Z992 Dependence on renal dialysis: Secondary | ICD-10-CM | POA: Diagnosis not present

## 2019-09-08 DIAGNOSIS — N2581 Secondary hyperparathyroidism of renal origin: Secondary | ICD-10-CM | POA: Diagnosis not present

## 2019-09-08 DIAGNOSIS — D631 Anemia in chronic kidney disease: Secondary | ICD-10-CM | POA: Diagnosis not present

## 2019-09-08 DIAGNOSIS — N186 End stage renal disease: Secondary | ICD-10-CM | POA: Diagnosis not present

## 2019-09-08 DIAGNOSIS — D509 Iron deficiency anemia, unspecified: Secondary | ICD-10-CM | POA: Diagnosis not present

## 2019-09-10 ENCOUNTER — Encounter (HOSPITAL_COMMUNITY): Payer: Medicare Other

## 2019-09-10 ENCOUNTER — Encounter: Payer: Medicare Other | Admitting: Family

## 2019-09-10 DIAGNOSIS — N186 End stage renal disease: Secondary | ICD-10-CM | POA: Diagnosis not present

## 2019-09-10 DIAGNOSIS — D631 Anemia in chronic kidney disease: Secondary | ICD-10-CM | POA: Diagnosis not present

## 2019-09-10 DIAGNOSIS — Z992 Dependence on renal dialysis: Secondary | ICD-10-CM | POA: Diagnosis not present

## 2019-09-10 DIAGNOSIS — D509 Iron deficiency anemia, unspecified: Secondary | ICD-10-CM | POA: Diagnosis not present

## 2019-09-10 DIAGNOSIS — N2581 Secondary hyperparathyroidism of renal origin: Secondary | ICD-10-CM | POA: Diagnosis not present

## 2019-09-12 DIAGNOSIS — D509 Iron deficiency anemia, unspecified: Secondary | ICD-10-CM | POA: Diagnosis not present

## 2019-09-12 DIAGNOSIS — N186 End stage renal disease: Secondary | ICD-10-CM | POA: Diagnosis not present

## 2019-09-12 DIAGNOSIS — N2581 Secondary hyperparathyroidism of renal origin: Secondary | ICD-10-CM | POA: Diagnosis not present

## 2019-09-12 DIAGNOSIS — Z992 Dependence on renal dialysis: Secondary | ICD-10-CM | POA: Diagnosis not present

## 2019-09-12 DIAGNOSIS — D631 Anemia in chronic kidney disease: Secondary | ICD-10-CM | POA: Diagnosis not present

## 2019-09-14 DIAGNOSIS — Z992 Dependence on renal dialysis: Secondary | ICD-10-CM | POA: Diagnosis not present

## 2019-09-14 DIAGNOSIS — D509 Iron deficiency anemia, unspecified: Secondary | ICD-10-CM | POA: Diagnosis not present

## 2019-09-14 DIAGNOSIS — N2581 Secondary hyperparathyroidism of renal origin: Secondary | ICD-10-CM | POA: Diagnosis not present

## 2019-09-14 DIAGNOSIS — N186 End stage renal disease: Secondary | ICD-10-CM | POA: Diagnosis not present

## 2019-09-14 DIAGNOSIS — D631 Anemia in chronic kidney disease: Secondary | ICD-10-CM | POA: Diagnosis not present

## 2019-09-16 DIAGNOSIS — D509 Iron deficiency anemia, unspecified: Secondary | ICD-10-CM | POA: Diagnosis not present

## 2019-09-16 DIAGNOSIS — D631 Anemia in chronic kidney disease: Secondary | ICD-10-CM | POA: Diagnosis not present

## 2019-09-16 DIAGNOSIS — N2581 Secondary hyperparathyroidism of renal origin: Secondary | ICD-10-CM | POA: Diagnosis not present

## 2019-09-16 DIAGNOSIS — N186 End stage renal disease: Secondary | ICD-10-CM | POA: Diagnosis not present

## 2019-09-16 DIAGNOSIS — Z992 Dependence on renal dialysis: Secondary | ICD-10-CM | POA: Diagnosis not present

## 2019-09-17 ENCOUNTER — Encounter: Payer: Medicare Other | Admitting: Vascular Surgery

## 2019-09-17 ENCOUNTER — Other Ambulatory Visit: Payer: Self-pay

## 2019-09-17 ENCOUNTER — Encounter (HOSPITAL_COMMUNITY): Payer: Medicare Other

## 2019-09-17 DIAGNOSIS — Z992 Dependence on renal dialysis: Secondary | ICD-10-CM

## 2019-09-17 DIAGNOSIS — N186 End stage renal disease: Secondary | ICD-10-CM

## 2019-09-19 DIAGNOSIS — Z992 Dependence on renal dialysis: Secondary | ICD-10-CM | POA: Diagnosis not present

## 2019-09-19 DIAGNOSIS — D509 Iron deficiency anemia, unspecified: Secondary | ICD-10-CM | POA: Diagnosis not present

## 2019-09-19 DIAGNOSIS — D631 Anemia in chronic kidney disease: Secondary | ICD-10-CM | POA: Diagnosis not present

## 2019-09-19 DIAGNOSIS — N186 End stage renal disease: Secondary | ICD-10-CM | POA: Diagnosis not present

## 2019-09-19 DIAGNOSIS — N2581 Secondary hyperparathyroidism of renal origin: Secondary | ICD-10-CM | POA: Diagnosis not present

## 2019-09-22 ENCOUNTER — Ambulatory Visit (INDEPENDENT_AMBULATORY_CARE_PROVIDER_SITE_OTHER): Payer: Medicare Other | Admitting: Physician Assistant

## 2019-09-22 ENCOUNTER — Other Ambulatory Visit: Payer: Self-pay | Admitting: *Deleted

## 2019-09-22 ENCOUNTER — Encounter (HOSPITAL_COMMUNITY): Payer: Medicare Other

## 2019-09-22 ENCOUNTER — Other Ambulatory Visit: Payer: Self-pay

## 2019-09-22 ENCOUNTER — Encounter: Payer: Self-pay | Admitting: *Deleted

## 2019-09-22 VITALS — BP 132/74 | HR 78 | Temp 98.0°F | Resp 14 | Ht 67.0 in | Wt 200.0 lb

## 2019-09-22 DIAGNOSIS — N186 End stage renal disease: Secondary | ICD-10-CM | POA: Diagnosis not present

## 2019-09-22 DIAGNOSIS — D509 Iron deficiency anemia, unspecified: Secondary | ICD-10-CM | POA: Diagnosis not present

## 2019-09-22 DIAGNOSIS — Z992 Dependence on renal dialysis: Secondary | ICD-10-CM

## 2019-09-22 DIAGNOSIS — N2581 Secondary hyperparathyroidism of renal origin: Secondary | ICD-10-CM | POA: Diagnosis not present

## 2019-09-22 DIAGNOSIS — D631 Anemia in chronic kidney disease: Secondary | ICD-10-CM | POA: Diagnosis not present

## 2019-09-22 NOTE — Progress Notes (Signed)
[facility] [place] HISTORY AND PHYSICAL   History of Present Illness:  Patient is a 38 y.o. year old female who presents for placement of a permanent hemodialysis access.  She underwent central venogram 09/02/19 by Dr. Trula Slade which showed Impression:             #1  Occluded right central venous system             #2  Patent right central venous system             #3  Patient will get vein mappnig in recovery room and will plan for new left sided access.  She had a right arm fistula that occluded.  We will now review vein mapping for new access options.   Her right arm incisions have healed well.  She currently is on HD via right TDC.     Past Medical History:  Diagnosis Date  . CHF (congestive heart failure) ([place]) 5-6 yrs ago  hosp  . Chronic kidney disease   . Dyspnea    with exertion  . ERYTHEMATOSUS, LUPUS 08/07/2006  . Hypertension   . Intestinal infection due to Clostridium difficile   . On supplemental oxygen by nasal cannula    as needed; does not wear CPAP or Bipap  . Pulmonary hypertension ([place])   . Secondary cardiomyopathy, unspecified   . Sleep apnea    does not wear CPAP  . Systemic lupus erythematosus ([place])   . TIA (transient ischemic attack)   . Unspecified deficiency anemia   . Unspecified essential hypertension     Past Surgical History:  Procedure Laterality Date  . AV FISTULA PLACEMENT Right 05/23/2016   Procedure: ARTERIOVENOUS (AV) FISTULA CREATION VERSUS GRAFT INSERTION;  Surgeon: Angelia Mould, MD;  Location: [facility];  Service: Vascular;  Laterality: Right;  . BIOPSY  08/12/2018   Procedure: BIOPSY;  Surgeon: Ladene Artist, MD;  Location: [facility];  Service: Endoscopy;;  . BREAST SURGERY     biopsy  . CARDIAC CATHETERIZATION    . COLONOSCOPY WITH PROPOFOL N/A 08/12/2018   Procedure: COLONOSCOPY WITH PROPOFOL;  Surgeon: Ladene Artist, MD;  Location: [facility];  Service: Endoscopy;  Laterality: N/A;  .  ESOPHAGOGASTRODUODENOSCOPY (EGD) WITH PROPOFOL N/A 08/12/2018   Procedure: ESOPHAGOGASTRODUODENOSCOPY (EGD) WITH PROPOFOL;  Surgeon: Ladene Artist, MD;  Location: [facility];  Service: Endoscopy;  Laterality: N/A;  . INSERTION OF DIALYSIS CATHETER N/A 08/29/2019   Procedure: INSERTION OF TUNNEL DIALYSIS CATHETER;  Surgeon: Angelia Mould, MD;  Location: [facility];  Service: Vascular;  Laterality: N/A;  . kidney biopsies     to determine lupus nephritis  . REVISON OF ARTERIOVENOUS FISTULA Right 08/27/2019   Procedure: REVISON OF ARTERIOVENOUS FISTULA  AND PLICATION RIGHT ARM;  Surgeon: Rosetta Posner, MD;  Location: [facility];  Service: Vascular;  Laterality: Right;  . UPPER EXTREMITY VENOGRAPHY Bilateral 09/02/2019   Procedure: UPPER EXTREMITY VENOGRAPHY;  Surgeon: Serafina Mitchell, MD;  Location: [facility];  Service: Cardiovascular;  Laterality: Bilateral;     Social History Social History   Tobacco Use  . Smoking status: Never Smoker  . Smokeless tobacco: Never Used  Substance Use Topics  . Alcohol use: No    Alcohol/week: 0.0 standard drinks  . Drug use: No    Family History Family History  Problem Relation Age of Onset  . Lupus Mother   . Cancer Maternal Grandmother  unknown  . Prostate cancer Paternal Grandfather   . Hypertension Father   . Diabetes Brother   . Diabetes Other        mat cousin    Allergies  Allergies  Allergen Reactions  . Lisinopril Swelling    SWELLING OF TOP LIP  . Nsaids Other (See Comments)    MD TOLD PATIENT TO AVOID NSAIDS DUE TO RENAL INSUFFICIENY.     Current Outpatient Medications  Medication Sig Dispense Refill  . b complex-vitamin c-folic acid (NEPHRO-VITE) 0.8 MG TABS tablet Take 1 tablet by mouth at bedtime.     . cinacalcet (SENSIPAR) 30 MG tablet Take 30 mg by mouth every evening.     . ferric citrate (AURYXIA) 1 GM 210 MG(Fe) tablet Take 630 mg by mouth 3 (three) times daily with meals.     .  hydroxychloroquine (PLAQUENIL) 200 MG tablet TAKE 1 TABLET BY MOUTH TWICE A DAY (Patient taking differently: Take 200 mg by mouth 2 (two) times daily. ) 60 tablet 1  . lidocaine-prilocaine (EMLA) cream Apply 1 application topically daily as needed. (Patient taking differently: Apply 1 application topically daily as needed (prior to port accessed.). ) 30 g 0  . macitentan (OPSUMIT) 10 MG tablet Take 10 mg by mouth daily.    . midodrine (PROAMATINE) 10 MG tablet Take 10 mg by mouth every Monday, Wednesday, and Friday with hemodialysis.    Marland Kitchen ondansetron (ZOFRAN-ODT) 4 MG disintegrating tablet Take 1 tablet (4 mg total) by mouth every 8 (eight) hours as needed for nausea or vomiting. 30 tablet 2  . pantoprazole (PROTONIX) 40 MG tablet Take 1 tablet (40 mg total) by mouth daily. 90 tablet 1  . predniSONE (DELTASONE) 5 MG tablet Take 5 mg by mouth every evening.    . triamcinolone ointment (KENALOG) 0.1 % Apply 1 application topically daily as needed (FOR RASH/SKIN IRRITATION.).     No current facility-administered medications for this visit.     ROS:   General:  No weight loss, Fever, chills  HEENT: No recent headaches, no nasal bleeding, no visual changes, no sore throat  Neurologic: No dizziness, blackouts, seizures. No recent symptoms of stroke or mini- stroke. No recent episodes of slurred speech, or temporary blindness.  Cardiac: No recent episodes of chest pain/pressure, no shortness of breath at rest.  No shortness of breath with exertion.  Denies history of atrial fibrillation or irregular heartbeat  Vascular: No history of rest pain in feet.  No history of claudication.  No history of non-healing ulcer, No history of DVT   Pulmonary: No home oxygen, no productive cough, no hemoptysis,  No asthma or wheezing  Musculoskeletal:  [ ]  Arthritis, [ ]  Low back pain,  [ ]  Joint pain  Hematologic:No history of hypercoagulable state.  No history of easy bleeding.  No history of  anemia  Gastrointestinal: No hematochezia or melena,  No gastroesophageal reflux, no trouble swallowing  Urinary: [ ]  chronic Kidney disease, [x ] on HD - [ x] MWF or [ ]  TTHS, [ ]  Burning with urination, [ ]  Frequent urination, [ ]  Difficulty urinating;   Skin: No rashes  Psychological: No history of anxiety,  No history of depression   Physical Examination  Vitals:   09/22/19 1443  BP: 132/74  Pulse: 78  Resp: 14  Temp: 98 F (36.7 C)  TempSrc: Temporal  SpO2: 99%  Weight: 200 lb (90.7 kg)  Height: 5\' 7"  (1.702 m)    Body mass index is  31.32 kg/m.  General:  Alert and oriented, no acute distress HEENT: Normal Neck: No bruit or JVD Pulmonary: Clear to auscultation bilaterally Cardiac: Regular Rate and Rhythm without murmur Gastrointestinal: Soft, non-tender, non-distended, no mass, no scars Skin: No rash Extremity Pulses:  2+ radial, brachial pulses bilaterally Musculoskeletal: No deformity or edema  Neurologic: Upper and lower extremity motor 5/5 and symmetric  DATA:   Right cephalic occluded +-----------------+-------------+----------+------------------------------+ Left Cephalic    Diameter (cm)Depth (cm)           Findings            +-----------------+-------------+----------+------------------------------+ Shoulder             0.19        0.91                                  +-----------------+-------------+----------+------------------------------+ Prox upper arm       0.07        0.99                                  +-----------------+-------------+----------+------------------------------+ Mid upper arm        0.12        0.75                                  +-----------------+-------------+----------+------------------------------+ Dist upper arm       0.10        0.65                                  +-----------------+-------------+----------+------------------------------+ Antecubital fossa                        unable to image due to IV site +-----------------+-------------+----------+------------------------------+ Prox forearm                                    not visualized         +-----------------+-------------+----------+------------------------------+ Mid forearm                                     not visualized         +-----------------+-------------+----------+------------------------------+  +-----------------+-------------+----------+-----------------+ Left Basilic     Diameter (cm)Depth (cm)    Findings      +-----------------+-------------+----------+-----------------+ Prox upper arm       0.26        1.37                     +-----------------+-------------+----------+-----------------+ Mid upper arm        0.12        1.54       branching     +-----------------+-------------+----------+-----------------+ Dist upper arm       0.12        1.15                     +-----------------+-------------+----------+-----------------+ Antecubital fossa  multiple branches +-----------------+-------------+----------+-----------------+   ASSESSMENT:  ESRD s/p right AV fistula thrombosis with occluded right central venous out flow.     PLAN: Her vein are insufficient for fistula creation.  She will be scheduled for left UE AV graft placement.  HD on MWF via right TDC.  She agrees to this plan.  Roxy Horseman PA-C [facility] [place] Office: [phone]  MD in clinic [place]

## 2019-09-24 ENCOUNTER — Telehealth: Payer: Self-pay | Admitting: Family Medicine

## 2019-09-24 NOTE — Telephone Encounter (Signed)
I left a message asking the patient to call and schedule Medicare AWV with Courtney ([facility]).  If patient calls back, please schedule Medicare Wellness Visit (initial) at next available opening.  VDM (Dee-Dee) 

## 2019-10-03 ENCOUNTER — Other Ambulatory Visit: Payer: Self-pay

## 2019-10-03 ENCOUNTER — Other Ambulatory Visit: Payer: Self-pay | Admitting: *Deleted

## 2019-10-03 ENCOUNTER — Telehealth: Payer: Self-pay | Admitting: Gastroenterology

## 2019-10-03 MED ORDER — DICYCLOMINE HCL 10 MG PO CAPS: 10.0000 mg | ORAL_CAPSULE | Freq: Three times a day (TID) | ORAL | 5 refills | Status: DC

## 2019-10-03 MED ORDER — DICYCLOMINE HCL 10 MG PO CAPS: 10.0000 mg | ORAL_CAPSULE | Freq: Three times a day (TID) | ORAL | 5 refills | Status: AC | PRN

## 2019-10-03 NOTE — Telephone Encounter (Signed)
Spoke to the patient who reports she has resumed taking her prescribed Glycopyrrolate (on 08/20/2019 this med was taken off of her med list because she reported she was no longer taking). She reports she was told back in May to take only on an "as needed" basis or taking 1/2 of a tablet. This documentation is in Epic, patient messaging. She reports she takes 1/2 of a tablet but reports the side effect that causes decreased saliva is so exaggerated it causes her to not be able to eat any food the rest of the day. She wants to know if there is another medication that can be prescribed without the unpleasant side effects. Please advise.

## 2019-10-03 NOTE — Telephone Encounter (Signed)
Dicyclomine 10 mg po tid prn, #90, 5 refills DC glycopyrrolate

## 2019-10-03 NOTE — Telephone Encounter (Signed)
Spoke to patient who is aware of the new script which was sent to the pharmacy on file.   Patient also aware to discontinue to glycopyrrolate.   Patient told to call the office with further questions or concerns.

## 2019-10-03 NOTE — Telephone Encounter (Signed)
Patient notified rx sent 

## 2019-10-04 ENCOUNTER — Other Ambulatory Visit (HOSPITAL_COMMUNITY)
Admission: RE | Admit: 2019-10-04 | Discharge: 2019-10-04 | Disposition: A | Discharge status: Home / Self Care | Payer: Medicare Other | Source: Ambulatory Visit | Attending: Vascular Surgery | Admitting: Vascular Surgery

## 2019-10-04 DIAGNOSIS — Z01812 Encounter for preprocedural laboratory examination: Secondary | ICD-10-CM | POA: Diagnosis present

## 2019-10-04 DIAGNOSIS — Z20828 Contact with and (suspected) exposure to other viral communicable diseases: Secondary | ICD-10-CM | POA: Insufficient documentation

## 2019-10-04 LAB — SARS CORONAVIRUS 2 (TAT 6-24 HRS): SARS Coronavirus 2: NEGATIVE

## 2019-10-07 ENCOUNTER — Other Ambulatory Visit: Payer: Self-pay

## 2019-10-07 ENCOUNTER — Encounter (HOSPITAL_COMMUNITY): Payer: Self-pay | Admitting: Surgery

## 2019-10-07 ENCOUNTER — Ambulatory Visit (HOSPITAL_COMMUNITY): Payer: Medicare Other | Admitting: Certified Registered"

## 2019-10-07 ENCOUNTER — Ambulatory Visit (HOSPITAL_COMMUNITY)
Admission: RE | Admit: 2019-10-07 | Discharge: 2019-10-07 | Disposition: A | Discharge status: Home / Self Care | Payer: Medicare Other | Attending: Surgery | Admitting: Surgery

## 2019-10-07 ENCOUNTER — Encounter (HOSPITAL_COMMUNITY)
Admission: RE | Disposition: A | Discharge status: Home / Self Care | Payer: Self-pay | Source: Home / Self Care | Attending: Surgery

## 2019-10-07 DIAGNOSIS — I429 Cardiomyopathy, unspecified: Secondary | ICD-10-CM | POA: Diagnosis not present

## 2019-10-07 DIAGNOSIS — D649 Anemia, unspecified: Secondary | ICD-10-CM | POA: Insufficient documentation

## 2019-10-07 DIAGNOSIS — G4730 Sleep apnea, unspecified: Secondary | ICD-10-CM | POA: Insufficient documentation

## 2019-10-07 DIAGNOSIS — Z8673 Personal history of transient ischemic attack (TIA), and cerebral infarction without residual deficits: Secondary | ICD-10-CM | POA: Insufficient documentation

## 2019-10-07 DIAGNOSIS — N186 End stage renal disease: Secondary | ICD-10-CM | POA: Diagnosis not present

## 2019-10-07 DIAGNOSIS — I509 Heart failure, unspecified: Secondary | ICD-10-CM | POA: Diagnosis not present

## 2019-10-07 DIAGNOSIS — Z992 Dependence on renal dialysis: Secondary | ICD-10-CM | POA: Diagnosis not present

## 2019-10-07 DIAGNOSIS — M329 Systemic lupus erythematosus, unspecified: Secondary | ICD-10-CM | POA: Insufficient documentation

## 2019-10-07 DIAGNOSIS — I132 Hypertensive heart and chronic kidney disease with heart failure and with stage 5 chronic kidney disease, or end stage renal disease: Secondary | ICD-10-CM | POA: Insufficient documentation

## 2019-10-07 DIAGNOSIS — Z79899 Other long term (current) drug therapy: Secondary | ICD-10-CM | POA: Insufficient documentation

## 2019-10-07 DIAGNOSIS — N185 Chronic kidney disease, stage 5: Secondary | ICD-10-CM

## 2019-10-07 DIAGNOSIS — I272 Pulmonary hypertension, unspecified: Secondary | ICD-10-CM | POA: Insufficient documentation

## 2019-10-07 HISTORY — PX: AV FISTULA PLACEMENT: SHX1204

## 2019-10-07 LAB — POCT I-STAT, CHEM 8
BUN: 30 mg/dL — ABNORMAL HIGH (ref 6–20)
Calcium, Ion: 0.97 mmol/L — ABNORMAL LOW (ref 1.15–1.40)
Chloride: 96 mmol/L — ABNORMAL LOW (ref 98–111)
Creatinine, Ser: 10 mg/dL — ABNORMAL HIGH (ref 0.44–1.00)
Glucose, Bld: 82 mg/dL (ref 70–99)
HCT: 41 % (ref 36.0–46.0)
Hemoglobin: 13.9 g/dL (ref 12.0–15.0)
Potassium: 4.6 mmol/L (ref 3.5–5.1)
Sodium: 135 mmol/L (ref 135–145)
TCO2: 33 mmol/L — ABNORMAL HIGH (ref 22–32)

## 2019-10-07 LAB — HCG, SERUM, QUALITATIVE: Preg, Serum: NEGATIVE

## 2019-10-07 SURGERY — INSERTION OF ARTERIOVENOUS (AV) GORE-TEX GRAFT ARM
Anesthesia: Monitor Anesthesia Care | Site: Arm Upper | Laterality: Left

## 2019-10-07 MED ORDER — PHENYLEPHRINE HCL-NACL 10-0.9 MG/250ML-% IV SOLN
INTRAVENOUS | Status: DC | PRN
  Administered 2019-10-07: 25 ug/min via INTRAVENOUS

## 2019-10-07 MED ORDER — CEFAZOLIN SODIUM-DEXTROSE 2-4 GM/100ML-% IV SOLN
2.0000 g | INTRAVENOUS | Status: AC
  Administered 2019-10-07: 11:00:00 2 g via INTRAVENOUS

## 2019-10-07 MED ORDER — LIDOCAINE 2% (20 MG/ML) 5 ML SYRINGE
INTRAMUSCULAR | Status: DC | PRN
  Administered 2019-10-07: 40 mg via INTRAVENOUS

## 2019-10-07 MED ORDER — GLYCOPYRROLATE PF 0.2 MG/ML IJ SOSY
PREFILLED_SYRINGE | INTRAMUSCULAR | Status: DC | PRN
  Administered 2019-10-07: .1 mg via INTRAVENOUS

## 2019-10-07 MED ORDER — HYDROCODONE-ACETAMINOPHEN 5-325 MG PO TABS: 1.0000 | ORAL_TABLET | Freq: Four times a day (QID) | ORAL | 0 refills | Status: DC | PRN

## 2019-10-07 MED ORDER — SODIUM CHLORIDE 0.9 % IV SOLN
INTRAVENOUS | Status: DC | PRN
  Administered 2019-10-07: 500 mL

## 2019-10-07 MED ORDER — ONDANSETRON HCL 4 MG/2ML IJ SOLN
INTRAMUSCULAR | Status: DC | PRN
  Administered 2019-10-07: 4 mg via INTRAVENOUS

## 2019-10-07 MED ORDER — LIDOCAINE HCL (PF) 1 % IJ SOLN
INTRAMUSCULAR | Status: AC
  Filled 2019-10-07: qty 30

## 2019-10-07 MED ORDER — MIDAZOLAM HCL 5 MG/5ML IJ SOLN
INTRAMUSCULAR | Status: DC | PRN
  Administered 2019-10-07: 2 mg via INTRAVENOUS

## 2019-10-07 MED ORDER — DEXAMETHASONE SODIUM PHOSPHATE 10 MG/ML IJ SOLN
INTRAMUSCULAR | Status: DC | PRN
  Administered 2019-10-07: 4 mg via INTRAVENOUS

## 2019-10-07 MED ORDER — LIDOCAINE-EPINEPHRINE 1 %-1:100000 IJ SOLN
INTRAMUSCULAR | Status: AC
  Filled 2019-10-07: qty 1

## 2019-10-07 MED ORDER — PROPOFOL 500 MG/50ML IV EMUL
INTRAVENOUS | Status: DC | PRN
  Administered 2019-10-07: 20 mg via INTRAVENOUS
  Administered 2019-10-07: 75 ug/kg/min via INTRAVENOUS

## 2019-10-07 MED ORDER — SODIUM CHLORIDE 0.9 % IV SOLN
INTRAVENOUS | Status: DC
  Administered 2019-10-07: 09:00:00 10 mL/h via INTRAVENOUS

## 2019-10-07 MED ORDER — LIDOCAINE-EPINEPHRINE 1 %-1:100000 IJ SOLN
INTRAMUSCULAR | Status: DC | PRN
  Administered 2019-10-07: 7.5 mL

## 2019-10-07 MED ORDER — HYDROCODONE-ACETAMINOPHEN 5-325 MG PO TABS: 1.0000 | ORAL_TABLET | ORAL | Status: DC | PRN

## 2019-10-07 MED ORDER — CEFAZOLIN SODIUM-DEXTROSE 2-4 GM/100ML-% IV SOLN
INTRAVENOUS | Status: AC
  Filled 2019-10-07: qty 100

## 2019-10-07 MED ORDER — FENTANYL CITRATE (PF) 250 MCG/5ML IJ SOLN
INTRAMUSCULAR | Status: AC
  Filled 2019-10-07: qty 5

## 2019-10-07 MED ORDER — PROPOFOL 10 MG/ML IV BOLUS
INTRAVENOUS | Status: AC
  Filled 2019-10-07: qty 20

## 2019-10-07 MED ORDER — FENTANYL CITRATE (PF) 250 MCG/5ML IJ SOLN
INTRAMUSCULAR | Status: DC | PRN
  Administered 2019-10-07: 50 ug via INTRAVENOUS
  Administered 2019-10-07 (×2): 25 ug via INTRAVENOUS

## 2019-10-07 MED ORDER — 0.9 % SODIUM CHLORIDE (POUR BTL) OPTIME
TOPICAL | Status: DC | PRN
  Administered 2019-10-07: 09:00:00 1000 mL

## 2019-10-07 MED ORDER — MIDAZOLAM HCL 2 MG/2ML IJ SOLN
INTRAMUSCULAR | Status: AC
  Filled 2019-10-07: qty 2

## 2019-10-07 MED ORDER — PHENYLEPHRINE 40 MCG/ML (10ML) SYRINGE FOR IV PUSH (FOR BLOOD PRESSURE SUPPORT)
PREFILLED_SYRINGE | INTRAVENOUS | Status: DC | PRN
  Administered 2019-10-07 (×2): 40 ug via INTRAVENOUS
  Administered 2019-10-07: 80 ug via INTRAVENOUS

## 2019-10-07 SURGICAL SUPPLY — 39 items
ARMBAND PINK RESTRICT EXTREMIT (MISCELLANEOUS) ×6 IMPLANT
CANISTER SUCT 3000ML PPV (MISCELLANEOUS) ×3 IMPLANT
CLIP VESOCCLUDE MED 6/CT (CLIP) ×3 IMPLANT
CLIP VESOCCLUDE SM WIDE 6/CT (CLIP) ×3 IMPLANT
COVER WAND RF STERILE (DRAPES) ×3 IMPLANT
DERMABOND ADVANCED (GAUZE/BANDAGES/DRESSINGS) ×2
DERMABOND ADVANCED .7 DNX12 (GAUZE/BANDAGES/DRESSINGS) ×1 IMPLANT
ELECT REM PT RETURN 9FT ADLT (ELECTROSURGICAL) ×3
ELECTRODE REM PT RTRN 9FT ADLT (ELECTROSURGICAL) ×1 IMPLANT
GLOVE BIOGEL PI IND STRL 7.5 (GLOVE) ×2 IMPLANT
GLOVE BIOGEL PI IND STRL 8 (GLOVE) ×1 IMPLANT
GLOVE BIOGEL PI INDICATOR 7.5 (GLOVE) ×4
GLOVE BIOGEL PI INDICATOR 8 (GLOVE) ×2
GLOVE ECLIPSE 7.0 STRL STRAW (GLOVE) ×3 IMPLANT
GLOVE ECLIPSE 8.0 STRL XLNG CF (GLOVE) ×3 IMPLANT
GLOVE SURG SS PI 7.5 STRL IVOR (GLOVE) ×3 IMPLANT
GOWN STRL REUS W/ TWL LRG LVL3 (GOWN DISPOSABLE) ×2 IMPLANT
GOWN STRL REUS W/ TWL XL LVL3 (GOWN DISPOSABLE) ×1 IMPLANT
GOWN STRL REUS W/TWL LRG LVL3 (GOWN DISPOSABLE) ×4
GOWN STRL REUS W/TWL XL LVL3 (GOWN DISPOSABLE) ×2
HEMOSTAT SNOW SURGICEL 2X4 (HEMOSTASIS) IMPLANT
KIT BASIN OR (CUSTOM PROCEDURE TRAY) ×3 IMPLANT
KIT TURNOVER KIT B (KITS) ×3 IMPLANT
NEEDLE HYPO 25GX1X1/2 BEV (NEEDLE) ×3 IMPLANT
NS IRRIG 1000ML POUR BTL (IV SOLUTION) ×3 IMPLANT
PACK CV ACCESS (CUSTOM PROCEDURE TRAY) IMPLANT
PACK PERIPHERAL VASCULAR (CUSTOM PROCEDURE TRAY) ×3 IMPLANT
PAD ARMBOARD 7.5X6 YLW CONV (MISCELLANEOUS) ×6 IMPLANT
STOCKINETTE 6  STRL (DRAPES) ×2
STOCKINETTE 6 STRL (DRAPES) ×1 IMPLANT
SUT PROLENE 6 0 BV (SUTURE) ×12 IMPLANT
SUT VIC AB 3-0 SH 27 (SUTURE) ×4
SUT VIC AB 3-0 SH 27X BRD (SUTURE) ×2 IMPLANT
SUT VICRYL 4-0 PS2 18IN ABS (SUTURE) IMPLANT
SYR CONTROL 10ML LL (SYRINGE) ×3 IMPLANT
SYR TOOMEY 50ML (SYRINGE) IMPLANT
TOWEL GREEN STERILE (TOWEL DISPOSABLE) ×3 IMPLANT
UNDERPAD 30X30 (UNDERPADS AND DIAPERS) ×3 IMPLANT
WATER STERILE IRR 1000ML POUR (IV SOLUTION) ×3 IMPLANT

## 2019-10-07 NOTE — Anesthesia Preprocedure Evaluation (Addendum)
Anesthesia Evaluation  Patient identified by MRN, date of birth, ID band Patient awake    Reviewed: Allergy & Precautions, NPO status , Patient's Chart, lab work & pertinent test results  Airway Mallampati: II  TM Distance: >3 FB     Dental   Pulmonary shortness of breath, sleep apnea ,    breath sounds clear to auscultation       Cardiovascular hypertension, +CHF and + DOE   Rhythm:Regular Rate:Normal     Neuro/Psych TIA   GI/Hepatic Neg liver ROS, GERD  ,  Endo/Other    Renal/GU Renal disease     Musculoskeletal   Abdominal   Peds  Hematology  (+) anemia ,   Anesthesia Other Findings   Reproductive/Obstetrics                            Anesthesia Physical Anesthesia Plan  ASA: III  Anesthesia Plan: MAC   Post-op Pain Management:    Induction: Intravenous  PONV Risk Score and Plan: 3  Airway Management Planned: Nasal Cannula and Simple Face Mask  Additional Equipment:   Intra-op Plan:   Post-operative Plan:   Informed Consent: I have reviewed the patients History and Physical, chart, labs and discussed the procedure including the risks, benefits and alternatives for the proposed anesthesia with the patient or authorized representative who has indicated his/her understanding and acceptance.     Dental advisory given  Plan Discussed with: CRNA and Anesthesiologist  Anesthesia Plan Comments:         Anesthesia Quick Evaluation

## 2019-10-07 NOTE — Op Note (Signed)
    Patient name: [name] [name] MRN: [id number] DOB: Mar 14, 1981 Sex: female  10/07/2019 Pre-operative Diagnosis: End-stage renal disease Post-operative diagnosis:  Same Surgeon:  Erlene Quan C. Donzetta Matters, MD Assistant: Risa Grill, PA Procedure Performed:  Left arm brachiocephalic AV fistula creation  Indications: 38 year old female with history of end-stage renal disease.  Previously was on dialysis via right arm AV fistula that has now failed and she has central venous occlusion on the right.  She does have a tunneled dialysis catheter on the right.  She is indicated for new access on the left.  Findings: By ultrasound she had suitable cephalic vein on the left.  By open exploration this vein did have some scarring but was 4 mm in diameter.  Brachial artery was quite small 2 mm.  After fistula creation there is a very strong thrill.  There is a week monophasic ulnar signal at the wrist does somewhat augment with compression of the fistula.   Procedure:  The patient was identified in the holding area and taken to the operating room where she is placed in proper table MAC anesthesia was induced.  She was sterilely prepped and draped in the left upper extremity usual fashion antibiotics were administered and a timeout was called.  We began using ultrasound to identify what appeared to be a suitable cephalic vein in the upper arm.  There is a palpable brachial pulse at the antecubitum.  The areas anesthetized 1% lidocaine transverse incision was made.  We dissected out the vein there was some scarring in the area where there had been a previous IV.  This was repaired with 6-0 Prolene suture.  Branches were taken from clips and ties.  The artery was dissected free through the deep fascia.  Vessel loop was placed around this.  It was clamped distally proximally opened longitudinally flushed heparinized saline by directions.  The vein was transected distally and tied off.  It was spatulated.  Was sewn into  side with 6-0 Prolene suture.  Prior to completion anastomosis we allowed flushing all direction.  Upon completion there is a strong thrill in the fistula.  There is only a weak monophasic signal at the wrist at the ulnar artery did somewhat augment with compression of the fistula.  We irrigated the wound we closed in layers of Vicryl and Monocryl.  Dermabond was placed at the level of the skin.  She was awakened from anesthesia having tolerated the procedure well without immediate complication.  All counts were correct at completion.  EBL: 25 cc   Aubrielle Stroud C. Donzetta Matters, MD [facility] Office: [phone] Pager: [phone]

## 2019-10-07 NOTE — H&P (Signed)
   History and Physical Update  The patient was interviewed and re-examined.  The patient's previous History and Physical has been reviewed and is unchanged from recent office visit. Plan for left arm avf vs more likely graft.  I have discussed the r/b/a with patient and she agrees to proceed.   Zaylei Mullane C. Donzetta Matters, MD [facility] [place] Office: [phone] Pager: [phone]

## 2019-10-07 NOTE — Transfer of Care (Signed)
Immediate Anesthesia Transfer of Care Note  Patient: [name] [name]  Procedure(s) Performed: Creation of left arm Brachiocephalic Fistula (Left Arm Upper)  Patient Location: PACU  Anesthesia Type:MAC  Level of Consciousness: awake and patient cooperative  Airway & Oxygen Therapy: Patient Spontanous Breathing  Post-op Assessment: Report given to RN, Post -op Vital signs reviewed and stable and Patient moving all extremities  Post vital signs: Reviewed and stable  Last Vitals:  Vitals Value Taken Time  BP    Temp    Pulse    Resp 19 10/07/19 1153  SpO2    Vitals shown include unvalidated device data.  Last Pain:  Vitals:   10/07/19 0859  TempSrc: Oral  PainSc: 0-No pain      Patients Stated Pain Goal: 5 (75/88/32 5498)  Complications: No apparent anesthesia complications

## 2019-10-07 NOTE — Discharge Instructions (Signed)
° °  [facility] ° °Discharge Instructions ° °AV Fistula or Graft Surgery for Dialysis Access ° °Please refer to the following instructions for your post-procedure care. Your surgeon or physician assistant will discuss any changes with you. ° °Activity ° °You may drive the day following your surgery, if you are comfortable and no longer taking prescription pain medication. Resume full activity as the soreness in your incision resolves. ° °Bathing/Showering ° °You may shower after you go home. Keep your incision dry for 48 hours. Do not soak in a bathtub, hot tub, or swim until the incision heals completely. You may not shower if you have a hemodialysis catheter. ° °Incision Care ° °Clean your incision with mild soap and water after 48 hours. Pat the area dry with a clean towel. You do not need a bandage unless otherwise instructed. Do not apply any ointments or creams to your incision. You may have skin glue on your incision. Do not peel it off. It will come off on its own in about one week. Your arm may swell a bit after surgery. To reduce swelling use pillows to elevate your arm so it is above your heart. Your doctor will tell you if you need to lightly wrap your arm with an ACE bandage. ° °Diet ° °Resume your normal diet. There are not special food restrictions following this procedure. In order to heal from your surgery, it is CRITICAL to get adequate nutrition. Your body requires vitamins, minerals, and protein. Vegetables are the best source of vitamins and minerals. Vegetables also provide the perfect balance of protein. Processed food has little nutritional value, so try to avoid this. ° °Medications ° °Resume taking all of your medications. If your incision is causing pain, you may take over-the counter pain relievers such as acetaminophen (Tylenol). If you were prescribed a stronger pain medication, please be aware these medications can cause nausea and constipation. Prevent  nausea by taking the medication with a snack or meal. Avoid constipation by drinking plenty of fluids and eating foods with high amount of fiber, such as fruits, vegetables, and grains. Do not take Tylenol if you are taking prescription pain medications. ° ° ° ° °Follow up °Your surgeon may want to see you in the office following your access surgery. If so, this will be arranged at the time of your surgery. ° °Please call us immediately for any of the following conditions: ° °Increased pain, redness, drainage (pus) from your incision site °Fever of 101 degrees or higher °Severe or worsening pain at your incision site °Hand pain or numbness. ° °Reduce your risk of vascular disease: ° °Stop smoking. If you would like help, call [facility] at 1-800-QUIT-NOW (1-800-784-8669) or [place] at 336-586-4000 ° °Manage your cholesterol °Maintain a desired weight °Control your diabetes °Keep your blood pressure down ° °Dialysis ° °It will take several weeks to several months for your new dialysis access to be ready for use. Your surgeon will determine when it is OK to use it. Your nephrologist will continue to direct your dialysis. You can continue to use your Permcath until your new access is ready for use. ° °If you have any questions, please call the office at [phone]. ° °

## 2019-10-10 NOTE — Anesthesia Postprocedure Evaluation (Signed)
Anesthesia Post Note  Patient: [name] [name]  Procedure(s) Performed: Creation of left arm Brachiocephalic Fistula (Left Arm Upper)     Patient location during evaluation: PACU Anesthesia Type: MAC Level of consciousness: awake Pain management: pain level controlled Vital Signs Assessment: post-procedure vital signs reviewed and stable Respiratory status: spontaneous breathing Cardiovascular status: stable Postop Assessment: no apparent nausea or vomiting Anesthetic complications: no    Last Vitals:  Vitals:   10/07/19 1208 10/07/19 1223  BP: 100/61 103/64  Pulse:    Resp: 16 14  Temp:    SpO2: 100% 100%    Last Pain:  Vitals:   10/07/19 1223  TempSrc:   PainSc: 0-No pain                 Zunairah Devers

## 2019-10-13 ENCOUNTER — Encounter: Payer: Self-pay | Admitting: *Deleted

## 2019-10-24 DIAGNOSIS — I158 Other secondary hypertension: Secondary | ICD-10-CM | POA: Diagnosis not present

## 2019-10-24 DIAGNOSIS — N186 End stage renal disease: Secondary | ICD-10-CM | POA: Diagnosis not present

## 2019-10-24 DIAGNOSIS — Z992 Dependence on renal dialysis: Secondary | ICD-10-CM | POA: Diagnosis not present

## 2019-10-25 DIAGNOSIS — Z992 Dependence on renal dialysis: Secondary | ICD-10-CM | POA: Diagnosis not present

## 2019-10-25 DIAGNOSIS — D509 Iron deficiency anemia, unspecified: Secondary | ICD-10-CM | POA: Diagnosis not present

## 2019-10-25 DIAGNOSIS — N186 End stage renal disease: Secondary | ICD-10-CM | POA: Diagnosis not present

## 2019-10-25 DIAGNOSIS — D631 Anemia in chronic kidney disease: Secondary | ICD-10-CM | POA: Diagnosis not present

## 2019-10-25 DIAGNOSIS — N2581 Secondary hyperparathyroidism of renal origin: Secondary | ICD-10-CM | POA: Diagnosis not present

## 2019-10-27 DIAGNOSIS — Z992 Dependence on renal dialysis: Secondary | ICD-10-CM | POA: Diagnosis not present

## 2019-10-27 DIAGNOSIS — N186 End stage renal disease: Secondary | ICD-10-CM | POA: Diagnosis not present

## 2019-10-27 DIAGNOSIS — D509 Iron deficiency anemia, unspecified: Secondary | ICD-10-CM | POA: Diagnosis not present

## 2019-10-27 DIAGNOSIS — N2581 Secondary hyperparathyroidism of renal origin: Secondary | ICD-10-CM | POA: Diagnosis not present

## 2019-10-27 DIAGNOSIS — D631 Anemia in chronic kidney disease: Secondary | ICD-10-CM | POA: Diagnosis not present

## 2019-10-29 DIAGNOSIS — D631 Anemia in chronic kidney disease: Secondary | ICD-10-CM | POA: Diagnosis not present

## 2019-10-29 DIAGNOSIS — Z992 Dependence on renal dialysis: Secondary | ICD-10-CM | POA: Diagnosis not present

## 2019-10-29 DIAGNOSIS — N2581 Secondary hyperparathyroidism of renal origin: Secondary | ICD-10-CM | POA: Diagnosis not present

## 2019-10-29 DIAGNOSIS — D509 Iron deficiency anemia, unspecified: Secondary | ICD-10-CM | POA: Diagnosis not present

## 2019-10-29 DIAGNOSIS — N186 End stage renal disease: Secondary | ICD-10-CM | POA: Diagnosis not present

## 2019-10-31 DIAGNOSIS — D509 Iron deficiency anemia, unspecified: Secondary | ICD-10-CM | POA: Diagnosis not present

## 2019-10-31 DIAGNOSIS — Z992 Dependence on renal dialysis: Secondary | ICD-10-CM | POA: Diagnosis not present

## 2019-10-31 DIAGNOSIS — D631 Anemia in chronic kidney disease: Secondary | ICD-10-CM | POA: Diagnosis not present

## 2019-10-31 DIAGNOSIS — N186 End stage renal disease: Secondary | ICD-10-CM | POA: Diagnosis not present

## 2019-10-31 DIAGNOSIS — N2581 Secondary hyperparathyroidism of renal origin: Secondary | ICD-10-CM | POA: Diagnosis not present

## 2019-11-03 DIAGNOSIS — D509 Iron deficiency anemia, unspecified: Secondary | ICD-10-CM | POA: Diagnosis not present

## 2019-11-03 DIAGNOSIS — N2581 Secondary hyperparathyroidism of renal origin: Secondary | ICD-10-CM | POA: Diagnosis not present

## 2019-11-03 DIAGNOSIS — N186 End stage renal disease: Secondary | ICD-10-CM | POA: Diagnosis not present

## 2019-11-03 DIAGNOSIS — Z992 Dependence on renal dialysis: Secondary | ICD-10-CM | POA: Diagnosis not present

## 2019-11-03 DIAGNOSIS — D631 Anemia in chronic kidney disease: Secondary | ICD-10-CM | POA: Diagnosis not present

## 2019-11-05 DIAGNOSIS — N186 End stage renal disease: Secondary | ICD-10-CM | POA: Diagnosis not present

## 2019-11-05 DIAGNOSIS — D509 Iron deficiency anemia, unspecified: Secondary | ICD-10-CM | POA: Diagnosis not present

## 2019-11-05 DIAGNOSIS — D631 Anemia in chronic kidney disease: Secondary | ICD-10-CM | POA: Diagnosis not present

## 2019-11-05 DIAGNOSIS — Z992 Dependence on renal dialysis: Secondary | ICD-10-CM | POA: Diagnosis not present

## 2019-11-05 DIAGNOSIS — N2581 Secondary hyperparathyroidism of renal origin: Secondary | ICD-10-CM | POA: Diagnosis not present

## 2019-11-07 ENCOUNTER — Telehealth (HOSPITAL_COMMUNITY): Payer: Self-pay

## 2019-11-07 ENCOUNTER — Other Ambulatory Visit: Payer: Self-pay

## 2019-11-07 DIAGNOSIS — Z992 Dependence on renal dialysis: Secondary | ICD-10-CM | POA: Diagnosis not present

## 2019-11-07 DIAGNOSIS — N186 End stage renal disease: Secondary | ICD-10-CM

## 2019-11-07 DIAGNOSIS — N2581 Secondary hyperparathyroidism of renal origin: Secondary | ICD-10-CM | POA: Diagnosis not present

## 2019-11-07 DIAGNOSIS — D631 Anemia in chronic kidney disease: Secondary | ICD-10-CM | POA: Diagnosis not present

## 2019-11-07 DIAGNOSIS — D509 Iron deficiency anemia, unspecified: Secondary | ICD-10-CM | POA: Diagnosis not present

## 2019-11-07 NOTE — Telephone Encounter (Signed)

## 2019-11-10 ENCOUNTER — Ambulatory Visit (INDEPENDENT_AMBULATORY_CARE_PROVIDER_SITE_OTHER): Payer: Self-pay | Admitting: Physician Assistant

## 2019-11-10 ENCOUNTER — Other Ambulatory Visit: Payer: Self-pay

## 2019-11-10 ENCOUNTER — Ambulatory Visit (HOSPITAL_COMMUNITY)
Admission: RE | Admit: 2019-11-10 | Discharge: 2019-11-10 | Disposition: A | Discharge status: Home / Self Care | Payer: Medicare Other | Source: Ambulatory Visit | Attending: Surgery | Admitting: Surgery

## 2019-11-10 VITALS — BP 97/63 | HR 115 | Temp 97.2°F | Resp 16 | Ht 67.0 in | Wt 198.0 lb

## 2019-11-10 DIAGNOSIS — N2581 Secondary hyperparathyroidism of renal origin: Secondary | ICD-10-CM | POA: Diagnosis not present

## 2019-11-10 DIAGNOSIS — Z992 Dependence on renal dialysis: Secondary | ICD-10-CM

## 2019-11-10 DIAGNOSIS — N186 End stage renal disease: Secondary | ICD-10-CM | POA: Diagnosis not present

## 2019-11-10 DIAGNOSIS — D509 Iron deficiency anemia, unspecified: Secondary | ICD-10-CM | POA: Diagnosis not present

## 2019-11-10 DIAGNOSIS — D631 Anemia in chronic kidney disease: Secondary | ICD-10-CM | POA: Diagnosis not present

## 2019-11-10 NOTE — Addendum Note (Signed)
Addended by: York Cerise C on: 11/10/2019 02:15 PM   Modules accepted: Orders, SmartSet

## 2019-11-10 NOTE — Progress Notes (Signed)
Established Dialysis Access   History of Present Illness   [name] [name] is a 39 y.o. (Dec 13, 1980) female who presents for re-evaluation of permanent access.  Prior access surgeries include right brachiocephalic fistula by Dr. Scot Dock in August 2017.  She then underwent revision by plication by Dr. Donnetta Hutching in November 2020.  Fistula promptly thrombosed and a TDC was placed.  She recently had left brachiocephalic fistula creation by Dr. Donzetta Matters on 10/07/2019.  She is dialyzing without complications from right IJ TDC despite known right central venous occlusion.  She is dialyzing on a Monday Wednesday Friday schedule.  The patient's PMH, PSH, SH, and FamHx were reviewed and are unchanged from prior visit.  Current Outpatient Medications  Medication Sig Dispense Refill  . b complex-vitamin c-folic acid (NEPHRO-VITE) 0.8 MG TABS tablet Take 1 tablet by mouth at bedtime.     . cinacalcet (SENSIPAR) 30 MG tablet Take 30 mg by mouth every evening.     . dicyclomine (BENTYL) 10 MG capsule Take 1 capsule (10 mg total) by mouth 3 (three) times daily as needed for spasms. 90 capsule 5  . dicyclomine (BENTYL) 10 MG capsule Take 1 capsule (10 mg total) by mouth 3 (three) times daily before meals. As needed for abdominal pain 90 capsule 5  . ferric citrate (AURYXIA) 1 GM 210 MG(Fe) tablet Take 630 mg by mouth 3 (three) times daily with meals.     . hydroxychloroquine (PLAQUENIL) 200 MG tablet TAKE 1 TABLET BY MOUTH TWICE A DAY (Patient taking differently: Take 200 mg by mouth 2 (two) times daily. ) 60 tablet 1  . lidocaine-prilocaine (EMLA) cream Apply 1 application topically daily as needed. (Patient taking differently: Apply 1 application topically daily as needed (prior to port accessed.). ) 30 g 0  . macitentan (OPSUMIT) 10 MG tablet Take 10 mg by mouth daily.    . midodrine (PROAMATINE) 10 MG tablet Take 10 mg by mouth every Monday, Wednesday, and Friday with hemodialysis.    Marland Kitchen ondansetron  (ZOFRAN-ODT) 4 MG disintegrating tablet Take 1 tablet (4 mg total) by mouth every 8 (eight) hours as needed for nausea or vomiting. 30 tablet 2  . pantoprazole (PROTONIX) 40 MG tablet Take 1 tablet (40 mg total) by mouth daily. 90 tablet 1  . predniSONE (DELTASONE) 5 MG tablet Take 5 mg by mouth every evening.    . triamcinolone ointment (KENALOG) 0.1 % Apply 1 application topically daily as needed (FOR RASH/SKIN IRRITATION.).    Marland Kitchen HYDROcodone-acetaminophen (NORCO/VICODIN) 5-325 MG tablet Take 1 tablet by mouth every 6 (six) hours as needed for up to 10 doses for moderate pain. 10 tablet 0   No current facility-administered medications for this visit.    On ROS today: 10 system ROS negative   Physical Examination   Vitals:   11/10/19 1326  BP: 97/63  Pulse: (!) 115  Resp: 16  Temp: (!) 97.2 F (36.2 C)  TempSrc: Temporal  SpO2: 99%  Weight: 198 lb (89.8 kg)  Height: 5\' 7"  (1.702 m)   Body mass index is 31.01 kg/m.  General Alert, O x 3, WD, NAD  Pulmonary Sym exp, good B air movt, CTA B  Cardiac RRR, Nl S1, S2, no Murmurs,  Vascular Vessel Right Left  Radial Palpable Palpable  Brachial Palpable Palpable  Ulnar Not palpable Not palpable    Musculo- skeletal M/S 5/5 throughout  , Extremities without ischemic changes; easily palpable thrill near anastomosis then fistula takes a deeper course into  distal upper arm    Neurologic A&O; CN grossly intact     Non-invasive Vascular Imaging   left Arm Access Duplex:   Diameters:  >6 mm  Depth:  10 mm    Medical Decision Making   [name] [name] is a 39 y.o. female who presents with ESRD requiring hemodialysis.    Patent left brachiocephalic fistula  Based on fistula duplex; fistula runs deeper than 7 mm throughout its course  Plan will be for superficialization of the left brachiocephalic fistula by Dr. Donzetta Matters in the next couple weeks Risk, benefits, and alternatives to access surgery were discussed.   The  patient is aware the risks include but are not limited to: bleeding, infection, steal syndrome, nerve damage, thrombosis, failure to mature, and need for additional procedures.   The patient agrees to proceed with the procedure.    Dagoberto Ligas PA-C [facility] Office: [phone]  Clinic MD: Trula Slade

## 2019-11-12 DIAGNOSIS — D631 Anemia in chronic kidney disease: Secondary | ICD-10-CM | POA: Diagnosis not present

## 2019-11-12 DIAGNOSIS — D509 Iron deficiency anemia, unspecified: Secondary | ICD-10-CM | POA: Diagnosis not present

## 2019-11-12 DIAGNOSIS — N2581 Secondary hyperparathyroidism of renal origin: Secondary | ICD-10-CM | POA: Diagnosis not present

## 2019-11-12 DIAGNOSIS — Z992 Dependence on renal dialysis: Secondary | ICD-10-CM | POA: Diagnosis not present

## 2019-11-12 DIAGNOSIS — N186 End stage renal disease: Secondary | ICD-10-CM | POA: Diagnosis not present

## 2019-11-14 DIAGNOSIS — N186 End stage renal disease: Secondary | ICD-10-CM | POA: Diagnosis not present

## 2019-11-14 DIAGNOSIS — N2581 Secondary hyperparathyroidism of renal origin: Secondary | ICD-10-CM | POA: Diagnosis not present

## 2019-11-14 DIAGNOSIS — D631 Anemia in chronic kidney disease: Secondary | ICD-10-CM | POA: Diagnosis not present

## 2019-11-14 DIAGNOSIS — D509 Iron deficiency anemia, unspecified: Secondary | ICD-10-CM | POA: Diagnosis not present

## 2019-11-14 DIAGNOSIS — Z992 Dependence on renal dialysis: Secondary | ICD-10-CM | POA: Diagnosis not present

## 2019-11-17 DIAGNOSIS — Z992 Dependence on renal dialysis: Secondary | ICD-10-CM | POA: Diagnosis not present

## 2019-11-17 DIAGNOSIS — N2581 Secondary hyperparathyroidism of renal origin: Secondary | ICD-10-CM | POA: Diagnosis not present

## 2019-11-17 DIAGNOSIS — D509 Iron deficiency anemia, unspecified: Secondary | ICD-10-CM | POA: Diagnosis not present

## 2019-11-17 DIAGNOSIS — D631 Anemia in chronic kidney disease: Secondary | ICD-10-CM | POA: Diagnosis not present

## 2019-11-17 DIAGNOSIS — N186 End stage renal disease: Secondary | ICD-10-CM | POA: Diagnosis not present

## 2019-11-19 DIAGNOSIS — Z992 Dependence on renal dialysis: Secondary | ICD-10-CM | POA: Diagnosis not present

## 2019-11-19 DIAGNOSIS — D509 Iron deficiency anemia, unspecified: Secondary | ICD-10-CM | POA: Diagnosis not present

## 2019-11-19 DIAGNOSIS — N186 End stage renal disease: Secondary | ICD-10-CM | POA: Diagnosis not present

## 2019-11-19 DIAGNOSIS — D631 Anemia in chronic kidney disease: Secondary | ICD-10-CM | POA: Diagnosis not present

## 2019-11-19 DIAGNOSIS — N2581 Secondary hyperparathyroidism of renal origin: Secondary | ICD-10-CM | POA: Diagnosis not present

## 2019-11-20 ENCOUNTER — Encounter (HOSPITAL_COMMUNITY): Payer: Self-pay | Admitting: Vascular Surgery

## 2019-11-20 NOTE — Progress Notes (Addendum)
Ms Simoneau denies chest pain or shortness of breath. Patient denies any s/s of Covid or any exposure to her knowledge.  Ms Runk will be tested 11/20/2018 after dialysis.  Ms Mitchell states she wears a wig and a scarf and she prefers to wear it into surgery.  Ms Tarnow does not void. Patient said that she does not want to have a pregnancy."I know I am not pregnant and I don't want blood drawn, there is no way I am pregnant."  Addendum I spoke to Dr Tobias Alexander at [phone], Dr Tobias Alexander said that patient does not h ave to have a pregnancy test.

## 2019-11-21 ENCOUNTER — Other Ambulatory Visit (HOSPITAL_COMMUNITY)
Admission: RE | Admit: 2019-11-21 | Discharge: 2019-11-21 | Disposition: A | Discharge status: Home / Self Care | Payer: Medicare Other | Source: Ambulatory Visit | Attending: Vascular Surgery | Admitting: Vascular Surgery

## 2019-11-21 DIAGNOSIS — Z01812 Encounter for preprocedural laboratory examination: Secondary | ICD-10-CM | POA: Diagnosis not present

## 2019-11-21 DIAGNOSIS — D631 Anemia in chronic kidney disease: Secondary | ICD-10-CM | POA: Diagnosis not present

## 2019-11-21 DIAGNOSIS — N2581 Secondary hyperparathyroidism of renal origin: Secondary | ICD-10-CM | POA: Diagnosis not present

## 2019-11-21 DIAGNOSIS — D509 Iron deficiency anemia, unspecified: Secondary | ICD-10-CM | POA: Diagnosis not present

## 2019-11-21 DIAGNOSIS — N186 End stage renal disease: Secondary | ICD-10-CM | POA: Diagnosis not present

## 2019-11-21 DIAGNOSIS — Z20822 Contact with and (suspected) exposure to covid-19: Secondary | ICD-10-CM | POA: Diagnosis not present

## 2019-11-21 DIAGNOSIS — Z992 Dependence on renal dialysis: Secondary | ICD-10-CM | POA: Diagnosis not present

## 2019-11-21 LAB — SARS CORONAVIRUS 2 (TAT 6-24 HRS): SARS Coronavirus 2: NEGATIVE

## 2019-11-24 ENCOUNTER — Encounter (HOSPITAL_COMMUNITY): Payer: Self-pay

## 2019-11-24 DIAGNOSIS — N2581 Secondary hyperparathyroidism of renal origin: Secondary | ICD-10-CM | POA: Diagnosis not present

## 2019-11-24 DIAGNOSIS — D631 Anemia in chronic kidney disease: Secondary | ICD-10-CM | POA: Diagnosis not present

## 2019-11-24 DIAGNOSIS — Z992 Dependence on renal dialysis: Secondary | ICD-10-CM | POA: Diagnosis not present

## 2019-11-24 DIAGNOSIS — I158 Other secondary hypertension: Secondary | ICD-10-CM | POA: Diagnosis not present

## 2019-11-24 DIAGNOSIS — D509 Iron deficiency anemia, unspecified: Secondary | ICD-10-CM | POA: Diagnosis not present

## 2019-11-24 DIAGNOSIS — N186 End stage renal disease: Secondary | ICD-10-CM | POA: Diagnosis not present

## 2019-11-24 NOTE — Progress Notes (Signed)
Spoke to patient on the phone about time change to surgery. Patient called back and stated that she did not have anyone to take her to surgery because of the time change. She stated she wished to cancel/reschedule. I informed her to call Dr. Claretha Cooper office in the AM and see if they could reschedule for her.

## 2019-11-25 ENCOUNTER — Ambulatory Visit (HOSPITAL_COMMUNITY): Admission: RE | Admit: 2019-11-25 | Payer: Medicare Other | Source: Home / Self Care | Admitting: Vascular Surgery

## 2019-11-25 HISTORY — DX: Personal history of other medical treatment: Z92.89

## 2019-11-25 HISTORY — DX: Cerebral infarction, unspecified: I63.9

## 2019-11-25 SURGERY — FISTULA SUPERFICIALIZATION
Anesthesia: Monitor Anesthesia Care | Laterality: Left

## 2019-11-26 DIAGNOSIS — D631 Anemia in chronic kidney disease: Secondary | ICD-10-CM | POA: Diagnosis not present

## 2019-11-26 DIAGNOSIS — N186 End stage renal disease: Secondary | ICD-10-CM | POA: Diagnosis not present

## 2019-11-26 DIAGNOSIS — D509 Iron deficiency anemia, unspecified: Secondary | ICD-10-CM | POA: Diagnosis not present

## 2019-11-26 DIAGNOSIS — N2581 Secondary hyperparathyroidism of renal origin: Secondary | ICD-10-CM | POA: Diagnosis not present

## 2019-11-26 DIAGNOSIS — Z992 Dependence on renal dialysis: Secondary | ICD-10-CM | POA: Diagnosis not present

## 2019-11-28 DIAGNOSIS — N186 End stage renal disease: Secondary | ICD-10-CM | POA: Diagnosis not present

## 2019-11-28 DIAGNOSIS — D509 Iron deficiency anemia, unspecified: Secondary | ICD-10-CM | POA: Diagnosis not present

## 2019-11-28 DIAGNOSIS — Z992 Dependence on renal dialysis: Secondary | ICD-10-CM | POA: Diagnosis not present

## 2019-11-28 DIAGNOSIS — N2581 Secondary hyperparathyroidism of renal origin: Secondary | ICD-10-CM | POA: Diagnosis not present

## 2019-11-28 DIAGNOSIS — D631 Anemia in chronic kidney disease: Secondary | ICD-10-CM | POA: Diagnosis not present

## 2019-12-01 DIAGNOSIS — N2581 Secondary hyperparathyroidism of renal origin: Secondary | ICD-10-CM | POA: Diagnosis not present

## 2019-12-01 DIAGNOSIS — D509 Iron deficiency anemia, unspecified: Secondary | ICD-10-CM | POA: Diagnosis not present

## 2019-12-01 DIAGNOSIS — N186 End stage renal disease: Secondary | ICD-10-CM | POA: Diagnosis not present

## 2019-12-01 DIAGNOSIS — D631 Anemia in chronic kidney disease: Secondary | ICD-10-CM | POA: Diagnosis not present

## 2019-12-01 DIAGNOSIS — Z992 Dependence on renal dialysis: Secondary | ICD-10-CM | POA: Diagnosis not present

## 2019-12-01 IMAGING — DX DG CHEST 2V
2 series · 2 of 2 positions shown · non-contrast
Comparison: 03/21/2016

CLINICAL DATA: Cough, short of breath

EXAM:
CHEST - 2 VIEW

[chest pa]
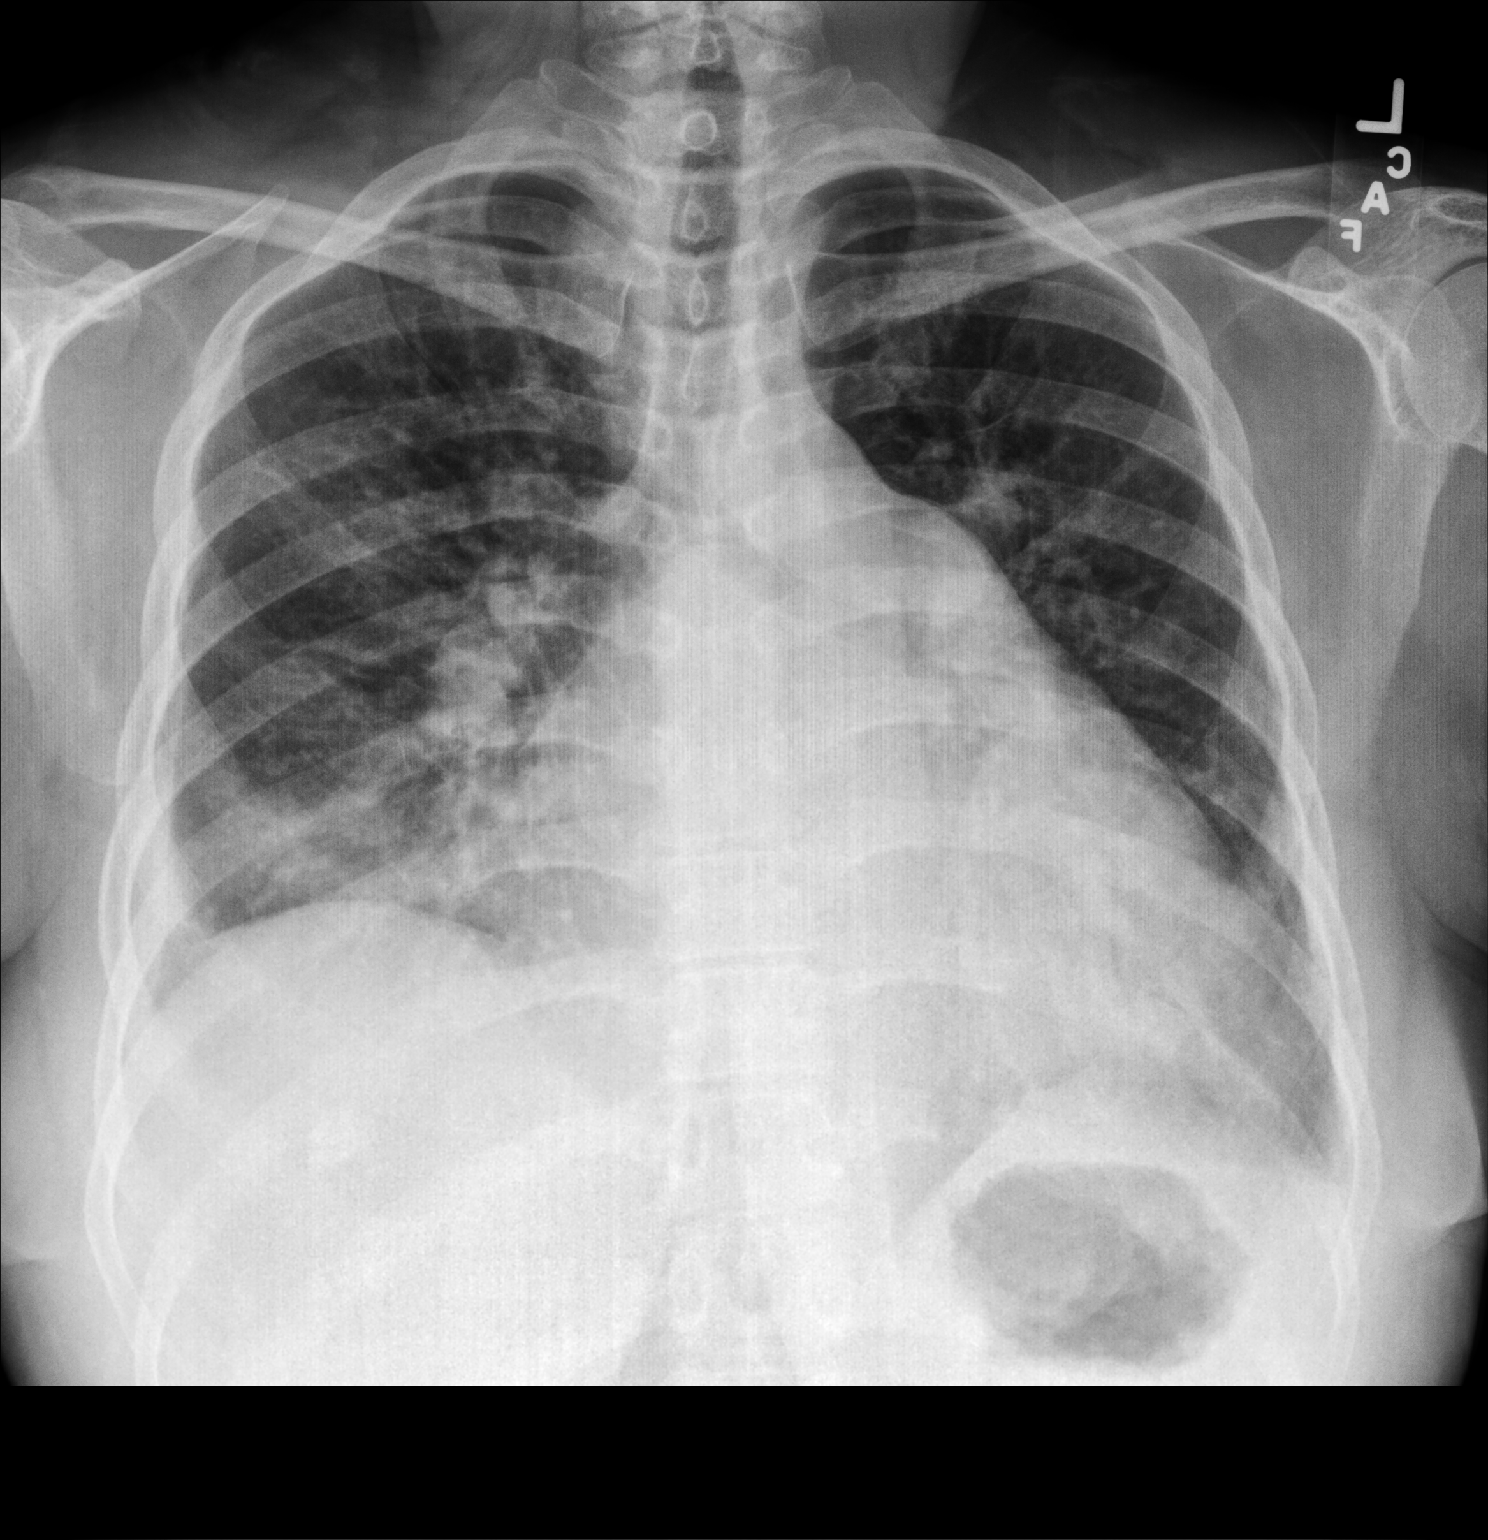

[chest lat]
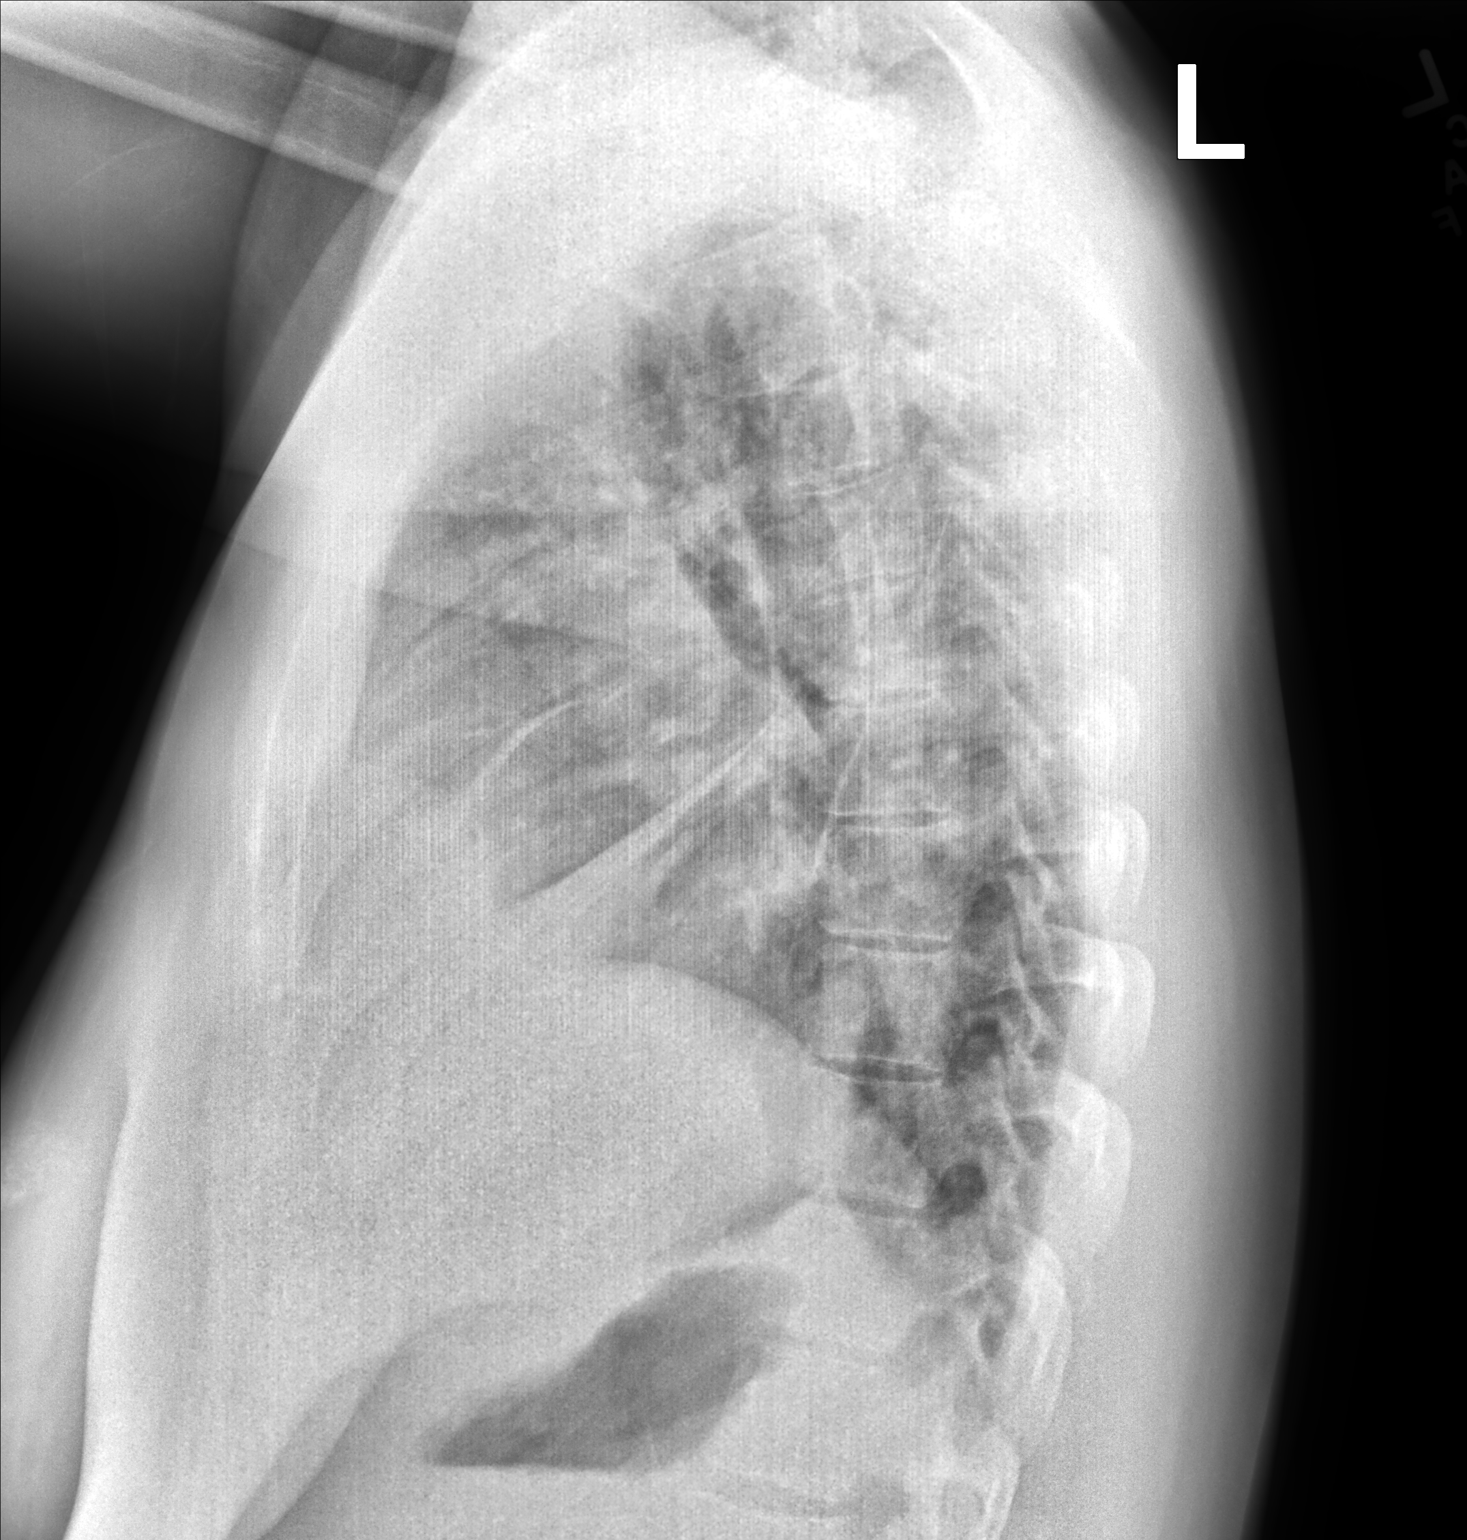

[2 of 2 positions shown; findings below may reference images not displayed]

FINDINGS: Cardiomegaly, increased compared to prior. Vascular congestion.
Patchy bilateral lower lobe airspace disease with small pleural
effusion. No pneumothorax.
IMPRESSION: 1. Enlarged cardiomediastinal silhouette with vascular congestion
2. Small pleural effusions. More confluent airspace disease at the
lung bases may reflect pneumonia

## 2019-12-02 DIAGNOSIS — I272 Pulmonary hypertension, unspecified: Secondary | ICD-10-CM | POA: Diagnosis not present

## 2019-12-03 DIAGNOSIS — N2581 Secondary hyperparathyroidism of renal origin: Secondary | ICD-10-CM | POA: Diagnosis not present

## 2019-12-03 DIAGNOSIS — D631 Anemia in chronic kidney disease: Secondary | ICD-10-CM | POA: Diagnosis not present

## 2019-12-03 DIAGNOSIS — N186 End stage renal disease: Secondary | ICD-10-CM | POA: Diagnosis not present

## 2019-12-03 DIAGNOSIS — Z992 Dependence on renal dialysis: Secondary | ICD-10-CM | POA: Diagnosis not present

## 2019-12-03 DIAGNOSIS — D509 Iron deficiency anemia, unspecified: Secondary | ICD-10-CM | POA: Diagnosis not present

## 2019-12-05 DIAGNOSIS — D631 Anemia in chronic kidney disease: Secondary | ICD-10-CM | POA: Diagnosis not present

## 2019-12-05 DIAGNOSIS — N186 End stage renal disease: Secondary | ICD-10-CM | POA: Diagnosis not present

## 2019-12-05 DIAGNOSIS — N2581 Secondary hyperparathyroidism of renal origin: Secondary | ICD-10-CM | POA: Diagnosis not present

## 2019-12-05 DIAGNOSIS — D509 Iron deficiency anemia, unspecified: Secondary | ICD-10-CM | POA: Diagnosis not present

## 2019-12-05 DIAGNOSIS — Z992 Dependence on renal dialysis: Secondary | ICD-10-CM | POA: Diagnosis not present

## 2019-12-08 DIAGNOSIS — N186 End stage renal disease: Secondary | ICD-10-CM | POA: Diagnosis not present

## 2019-12-08 DIAGNOSIS — N2581 Secondary hyperparathyroidism of renal origin: Secondary | ICD-10-CM | POA: Diagnosis not present

## 2019-12-08 DIAGNOSIS — D509 Iron deficiency anemia, unspecified: Secondary | ICD-10-CM | POA: Diagnosis not present

## 2019-12-08 DIAGNOSIS — D631 Anemia in chronic kidney disease: Secondary | ICD-10-CM | POA: Diagnosis not present

## 2019-12-08 DIAGNOSIS — Z992 Dependence on renal dialysis: Secondary | ICD-10-CM | POA: Diagnosis not present

## 2019-12-09 ENCOUNTER — Encounter (HOSPITAL_COMMUNITY): Payer: Self-pay | Admitting: Vascular Surgery

## 2019-12-09 ENCOUNTER — Other Ambulatory Visit (HOSPITAL_COMMUNITY)
Admission: RE | Admit: 2019-12-09 | Discharge: 2019-12-09 | Disposition: A | Discharge status: Home / Self Care | Payer: Medicare Other | Source: Ambulatory Visit | Attending: Vascular Surgery | Admitting: Vascular Surgery

## 2019-12-09 ENCOUNTER — Other Ambulatory Visit: Payer: Self-pay

## 2019-12-09 DIAGNOSIS — Z01812 Encounter for preprocedural laboratory examination: Secondary | ICD-10-CM | POA: Diagnosis not present

## 2019-12-09 DIAGNOSIS — Z20822 Contact with and (suspected) exposure to covid-19: Secondary | ICD-10-CM | POA: Diagnosis not present

## 2019-12-09 LAB — SARS CORONAVIRUS 2 (TAT 6-24 HRS): SARS Coronavirus 2: NEGATIVE

## 2019-12-09 NOTE — Progress Notes (Signed)
Spoke with pt for pre-op call. Pt has hx of Pulmonary HTN and Lupus. Pt denies any recent chest pain or increase in shortness of breath. Pt states she is not diabetic.   Pt had her Covid test done today. States she's in quarantine and understands she needs to stay in quarantine until she comes to the hospital.   Please see Jan's note on 11/20/19 about pt not wanting a serum pregnancy test.

## 2019-12-10 DIAGNOSIS — D631 Anemia in chronic kidney disease: Secondary | ICD-10-CM | POA: Diagnosis not present

## 2019-12-10 DIAGNOSIS — N2581 Secondary hyperparathyroidism of renal origin: Secondary | ICD-10-CM | POA: Diagnosis not present

## 2019-12-10 DIAGNOSIS — D509 Iron deficiency anemia, unspecified: Secondary | ICD-10-CM | POA: Diagnosis not present

## 2019-12-10 DIAGNOSIS — Z992 Dependence on renal dialysis: Secondary | ICD-10-CM | POA: Diagnosis not present

## 2019-12-10 DIAGNOSIS — N186 End stage renal disease: Secondary | ICD-10-CM | POA: Diagnosis not present

## 2019-12-10 NOTE — Anesthesia Preprocedure Evaluation (Addendum)
Anesthesia Evaluation  Patient identified by MRN, date of birth, ID band Patient awake    Reviewed: Allergy & Precautions, NPO status , Patient's Chart, lab work & pertinent test results  Airway Mallampati: II  TM Distance: >3 FB     Dental   Pulmonary shortness of breath, sleep apnea ,    breath sounds clear to auscultation       Cardiovascular hypertension, +CHF and + DOE   Rhythm:Regular Rate:Normal     Neuro/Psych TIA   GI/Hepatic Neg liver ROS, GERD  ,  Endo/Other    Renal/GU Renal disease     Musculoskeletal   Abdominal   Peds  Hematology  (+) anemia ,   Anesthesia Other Findings   Reproductive/Obstetrics                             Lab Results  Component Value Date   WBC 2.4 (L) 11/26/2018   HGB 14.3 12/11/2019   HCT 42.0 12/11/2019   MCV 98.5 11/26/2018   PLT 155 11/26/2018   Lab Results  Component Value Date   CREATININE 10.80 (H) 12/11/2019   BUN 35 (H) 12/11/2019   NA 134 (L) 12/11/2019   K 4.9 12/11/2019   CL 97 (L) 12/11/2019   CO2 32 11/26/2018    Anesthesia Physical  Anesthesia Plan  ASA: III  Anesthesia Plan: MAC   Post-op Pain Management:    Induction: Intravenous  PONV Risk Score and Plan: 3  Airway Management Planned: Nasal Cannula and Simple Face Mask  Additional Equipment:   Intra-op Plan:   Post-operative Plan:   Informed Consent: I have reviewed the patients History and Physical, chart, labs and discussed the procedure including the risks, benefits and alternatives for the proposed anesthesia with the patient or authorized representative who has indicated his/her understanding and acceptance.     Dental advisory given  Plan Discussed with: CRNA and Anesthesiologist  Anesthesia Plan Comments:         Anesthesia Quick Evaluation

## 2019-12-11 ENCOUNTER — Other Ambulatory Visit: Payer: Self-pay

## 2019-12-11 ENCOUNTER — Encounter (HOSPITAL_COMMUNITY)
Admission: RE | Disposition: A | Discharge status: Home / Self Care | Payer: Self-pay | Source: Home / Self Care | Attending: Vascular Surgery

## 2019-12-11 ENCOUNTER — Encounter (HOSPITAL_COMMUNITY): Payer: Self-pay | Admitting: Vascular Surgery

## 2019-12-11 ENCOUNTER — Ambulatory Visit (HOSPITAL_COMMUNITY): Payer: Medicare Other | Admitting: Anesthesiology

## 2019-12-11 ENCOUNTER — Ambulatory Visit (HOSPITAL_COMMUNITY)
Admission: RE | Admit: 2019-12-11 | Discharge: 2019-12-11 | Disposition: A | Discharge status: Home / Self Care | Payer: Medicare Other | Attending: Vascular Surgery | Admitting: Vascular Surgery

## 2019-12-11 DIAGNOSIS — E1122 Type 2 diabetes mellitus with diabetic chronic kidney disease: Secondary | ICD-10-CM | POA: Diagnosis not present

## 2019-12-11 DIAGNOSIS — I132 Hypertensive heart and chronic kidney disease with heart failure and with stage 5 chronic kidney disease, or end stage renal disease: Secondary | ICD-10-CM | POA: Insufficient documentation

## 2019-12-11 DIAGNOSIS — G4730 Sleep apnea, unspecified: Secondary | ICD-10-CM | POA: Diagnosis not present

## 2019-12-11 DIAGNOSIS — Z992 Dependence on renal dialysis: Secondary | ICD-10-CM | POA: Diagnosis not present

## 2019-12-11 DIAGNOSIS — N186 End stage renal disease: Secondary | ICD-10-CM | POA: Diagnosis not present

## 2019-12-11 DIAGNOSIS — D631 Anemia in chronic kidney disease: Secondary | ICD-10-CM | POA: Diagnosis not present

## 2019-12-11 DIAGNOSIS — N185 Chronic kidney disease, stage 5: Secondary | ICD-10-CM | POA: Diagnosis not present

## 2019-12-11 DIAGNOSIS — Z8673 Personal history of transient ischemic attack (TIA), and cerebral infarction without residual deficits: Secondary | ICD-10-CM | POA: Diagnosis not present

## 2019-12-11 DIAGNOSIS — Z7952 Long term (current) use of systemic steroids: Secondary | ICD-10-CM | POA: Diagnosis not present

## 2019-12-11 DIAGNOSIS — I509 Heart failure, unspecified: Secondary | ICD-10-CM | POA: Insufficient documentation

## 2019-12-11 DIAGNOSIS — Z79899 Other long term (current) drug therapy: Secondary | ICD-10-CM | POA: Insufficient documentation

## 2019-12-11 HISTORY — DX: Gastro-esophageal reflux disease without esophagitis: K21.9

## 2019-12-11 HISTORY — PX: FISTULA SUPERFICIALIZATION: SHX6341

## 2019-12-11 LAB — POCT I-STAT, CHEM 8
BUN: 35 mg/dL — ABNORMAL HIGH (ref 6–20)
Calcium, Ion: 1 mmol/L — ABNORMAL LOW (ref 1.15–1.40)
Chloride: 97 mmol/L — ABNORMAL LOW (ref 98–111)
Creatinine, Ser: 10.8 mg/dL — ABNORMAL HIGH (ref 0.44–1.00)
Glucose, Bld: 93 mg/dL (ref 70–99)
HCT: 42 % (ref 36.0–46.0)
Hemoglobin: 14.3 g/dL (ref 12.0–15.0)
Potassium: 4.9 mmol/L (ref 3.5–5.1)
Sodium: 134 mmol/L — ABNORMAL LOW (ref 135–145)
TCO2: 29 mmol/L (ref 22–32)

## 2019-12-11 SURGERY — FISTULA SUPERFICIALIZATION
Anesthesia: Monitor Anesthesia Care | Site: Arm Upper | Laterality: Left

## 2019-12-11 MED ORDER — LIDOCAINE-EPINEPHRINE 1 %-1:100000 IJ SOLN
INTRAMUSCULAR | Status: AC
  Filled 2019-12-11: qty 1

## 2019-12-11 MED ORDER — PHENYLEPHRINE 40 MCG/ML (10ML) SYRINGE FOR IV PUSH (FOR BLOOD PRESSURE SUPPORT)
PREFILLED_SYRINGE | INTRAVENOUS | Status: AC
  Filled 2019-12-11: qty 10

## 2019-12-11 MED ORDER — PHENYLEPHRINE HCL-NACL 10-0.9 MG/250ML-% IV SOLN
INTRAVENOUS | Status: DC | PRN
  Administered 2019-12-11: 60 ug/min via INTRAVENOUS

## 2019-12-11 MED ORDER — LIDOCAINE-EPINEPHRINE 1 %-1:100000 IJ SOLN
INTRAMUSCULAR | Status: DC | PRN
  Administered 2019-12-11: 20 mL

## 2019-12-11 MED ORDER — FENTANYL CITRATE (PF) 250 MCG/5ML IJ SOLN
INTRAMUSCULAR | Status: AC
  Filled 2019-12-11: qty 5

## 2019-12-11 MED ORDER — DIPHENHYDRAMINE HCL 50 MG/ML IJ SOLN
INTRAMUSCULAR | Status: DC | PRN
  Administered 2019-12-11: 12.5 mg via INTRAVENOUS

## 2019-12-11 MED ORDER — PROPOFOL 10 MG/ML IV BOLUS
INTRAVENOUS | Status: DC | PRN
  Administered 2019-12-11 (×2): 20 mg via INTRAVENOUS

## 2019-12-11 MED ORDER — HYDROCODONE-ACETAMINOPHEN 5-325 MG PO TABS: 1.0000 | ORAL_TABLET | Freq: Four times a day (QID) | ORAL | 0 refills | Status: DC | PRN

## 2019-12-11 MED ORDER — DEXAMETHASONE SODIUM PHOSPHATE 4 MG/ML IJ SOLN
INTRAMUSCULAR | Status: DC | PRN
  Administered 2019-12-11: 4 mg via INTRAVENOUS

## 2019-12-11 MED ORDER — ONDANSETRON HCL 4 MG/2ML IJ SOLN
INTRAMUSCULAR | Status: DC | PRN
  Administered 2019-12-11: 4 mg via INTRAVENOUS

## 2019-12-11 MED ORDER — ONDANSETRON HCL 4 MG/2ML IJ SOLN
INTRAMUSCULAR | Status: AC
  Filled 2019-12-11: qty 2

## 2019-12-11 MED ORDER — SODIUM CHLORIDE 0.9 % IV SOLN
INTRAVENOUS | Status: DC | PRN
  Administered 2019-12-11: 500 mL

## 2019-12-11 MED ORDER — SODIUM CHLORIDE 0.9 % IV SOLN: 250.0000 mL | INTRAVENOUS | Status: DC | PRN

## 2019-12-11 MED ORDER — HEMOSTATIC AGENTS (NO CHARGE) OPTIME
TOPICAL | Status: DC | PRN
  Administered 2019-12-11: 1 via TOPICAL

## 2019-12-11 MED ORDER — ALBUMIN HUMAN 5 % IV SOLN: INTRAVENOUS | Status: DC | PRN

## 2019-12-11 MED ORDER — FENTANYL CITRATE (PF) 100 MCG/2ML IJ SOLN
INTRAMUSCULAR | Status: DC | PRN
  Administered 2019-12-11 (×4): 25 ug via INTRAVENOUS

## 2019-12-11 MED ORDER — DIPHENHYDRAMINE HCL 50 MG/ML IJ SOLN
INTRAMUSCULAR | Status: AC
  Filled 2019-12-11: qty 1

## 2019-12-11 MED ORDER — DEXAMETHASONE SODIUM PHOSPHATE 10 MG/ML IJ SOLN
INTRAMUSCULAR | Status: AC
  Filled 2019-12-11: qty 1

## 2019-12-11 MED ORDER — LIDOCAINE 2% (20 MG/ML) 5 ML SYRINGE
INTRAMUSCULAR | Status: AC
  Filled 2019-12-11: qty 5

## 2019-12-11 MED ORDER — ONDANSETRON HCL 4 MG/2ML IJ SOLN: 4.0000 mg | Freq: Once | INTRAMUSCULAR | Status: DC | PRN

## 2019-12-11 MED ORDER — SODIUM CHLORIDE 0.9 % IV SOLN: INTRAVENOUS | Status: DC | PRN

## 2019-12-11 MED ORDER — PROPOFOL 500 MG/50ML IV EMUL
INTRAVENOUS | Status: DC | PRN
  Administered 2019-12-11: 65 ug/kg/min via INTRAVENOUS

## 2019-12-11 MED ORDER — HYDROCODONE-ACETAMINOPHEN 5-325 MG PO TABS
1.0000 | ORAL_TABLET | Freq: Once | ORAL | Status: AC
  Administered 2019-12-11: 1 via ORAL

## 2019-12-11 MED ORDER — MIDODRINE HCL 5 MG PO TABS
10.0000 mg | ORAL_TABLET | Freq: Once | ORAL | Status: AC
  Administered 2019-12-11: 10 mg via ORAL
  Filled 2019-12-11: qty 2

## 2019-12-11 MED ORDER — SODIUM CHLORIDE 0.9% FLUSH: 3.0000 mL | Freq: Two times a day (BID) | INTRAVENOUS | Status: DC

## 2019-12-11 MED ORDER — PHENYLEPHRINE 40 MCG/ML (10ML) SYRINGE FOR IV PUSH (FOR BLOOD PRESSURE SUPPORT)
PREFILLED_SYRINGE | INTRAVENOUS | Status: DC | PRN
  Administered 2019-12-11 (×3): 80 ug via INTRAVENOUS

## 2019-12-11 MED ORDER — CEFAZOLIN SODIUM-DEXTROSE 2-3 GM-%(50ML) IV SOLR
INTRAVENOUS | Status: DC | PRN
  Administered 2019-12-11: 2 g via INTRAVENOUS

## 2019-12-11 MED ORDER — 0.9 % SODIUM CHLORIDE (POUR BTL) OPTIME
TOPICAL | Status: DC | PRN
  Administered 2019-12-11: 1000 mL

## 2019-12-11 MED ORDER — PROPOFOL 10 MG/ML IV BOLUS
INTRAVENOUS | Status: AC
  Filled 2019-12-11: qty 20

## 2019-12-11 MED ORDER — FENTANYL CITRATE (PF) 100 MCG/2ML IJ SOLN: 25.0000 ug | INTRAMUSCULAR | Status: DC | PRN

## 2019-12-11 MED ORDER — SODIUM CHLORIDE 0.9% FLUSH: 3.0000 mL | INTRAVENOUS | Status: DC | PRN

## 2019-12-11 MED ORDER — CEFAZOLIN SODIUM 1 G IJ SOLR
INTRAMUSCULAR | Status: AC
  Filled 2019-12-11: qty 20

## 2019-12-11 MED ORDER — SODIUM CHLORIDE 0.9 % IV SOLN
INTRAVENOUS | Status: AC
  Filled 2019-12-11: qty 1.2

## 2019-12-11 MED ORDER — LIDOCAINE 2% (20 MG/ML) 5 ML SYRINGE
INTRAMUSCULAR | Status: DC | PRN
  Administered 2019-12-11: 40 mg via INTRAVENOUS

## 2019-12-11 MED ORDER — MIDAZOLAM HCL 2 MG/2ML IJ SOLN
INTRAMUSCULAR | Status: DC | PRN
  Administered 2019-12-11: 1 mg via INTRAVENOUS

## 2019-12-11 MED ORDER — MIDAZOLAM HCL 2 MG/2ML IJ SOLN
INTRAMUSCULAR | Status: AC
  Filled 2019-12-11: qty 2

## 2019-12-11 MED ORDER — HYDROCODONE-ACETAMINOPHEN 5-325 MG PO TABS
ORAL_TABLET | ORAL | Status: AC
  Filled 2019-12-11: qty 1

## 2019-12-11 SURGICAL SUPPLY — 28 items
ARMBAND PINK RESTRICT EXTREMIT (MISCELLANEOUS) ×3 IMPLANT
CANISTER SUCT 3000ML PPV (MISCELLANEOUS) ×3 IMPLANT
CLIP VESOCCLUDE MED 6/CT (CLIP) ×3 IMPLANT
CLIP VESOCCLUDE SM WIDE 6/CT (CLIP) ×3 IMPLANT
COVER PROBE W GEL 5X96 (DRAPES) ×3 IMPLANT
COVER WAND RF STERILE (DRAPES) IMPLANT
DERMABOND ADVANCED (GAUZE/BANDAGES/DRESSINGS) ×2
DERMABOND ADVANCED .7 DNX12 (GAUZE/BANDAGES/DRESSINGS) ×1 IMPLANT
ELECT REM PT RETURN 9FT ADLT (ELECTROSURGICAL) ×3
ELECTRODE REM PT RTRN 9FT ADLT (ELECTROSURGICAL) ×1 IMPLANT
GLOVE BIO SURGEON STRL SZ7.5 (GLOVE) ×6 IMPLANT
GOWN STRL REUS W/ TWL LRG LVL3 (GOWN DISPOSABLE) ×2 IMPLANT
GOWN STRL REUS W/ TWL XL LVL3 (GOWN DISPOSABLE) ×2 IMPLANT
GOWN STRL REUS W/TWL LRG LVL3 (GOWN DISPOSABLE) ×4
GOWN STRL REUS W/TWL XL LVL3 (GOWN DISPOSABLE) ×4
HEMOSTAT SNOW SURGICEL 2X4 (HEMOSTASIS) ×3 IMPLANT
KIT BASIN OR (CUSTOM PROCEDURE TRAY) ×3 IMPLANT
KIT TURNOVER KIT B (KITS) ×3 IMPLANT
NS IRRIG 1000ML POUR BTL (IV SOLUTION) ×3 IMPLANT
PACK CV ACCESS (CUSTOM PROCEDURE TRAY) ×3 IMPLANT
PAD ARMBOARD 7.5X6 YLW CONV (MISCELLANEOUS) ×6 IMPLANT
SUT MNCRL AB 4-0 PS2 18 (SUTURE) ×6 IMPLANT
SUT PROLENE 6 0 BV (SUTURE) ×3 IMPLANT
SUT VIC AB 3-0 SH 27 (SUTURE) ×2
SUT VIC AB 3-0 SH 27X BRD (SUTURE) ×1 IMPLANT
TOWEL GREEN STERILE (TOWEL DISPOSABLE) ×3 IMPLANT
UNDERPAD 30X30 (UNDERPADS AND DIAPERS) ×3 IMPLANT
WATER STERILE IRR 1000ML POUR (IV SOLUTION) ×3 IMPLANT

## 2019-12-11 NOTE — Anesthesia Procedure Notes (Signed)
Procedure Name: MAC Date/Time: 12/11/2019 7:44 AM Performed by: Orlie Dakin, CRNA Pre-anesthesia Checklist: Patient identified, Emergency Drugs available, Suction available and Patient being monitored Patient Re-evaluated:Patient Re-evaluated prior to induction Oxygen Delivery Method: Simple face mask Preoxygenation: Pre-oxygenation with 100% oxygen Induction Type: IV induction Placement Confirmation: positive ETCO2

## 2019-12-11 NOTE — Discharge Instructions (Signed)
° °  [facility] ° °Discharge Instructions ° °AV Fistula or Graft Surgery for Dialysis Access ° °Please refer to the following instructions for your post-procedure care. Your surgeon or physician assistant will discuss any changes with you. ° °Activity ° °You may drive the day following your surgery, if you are comfortable and no longer taking prescription pain medication. Resume full activity as the soreness in your incision resolves. ° °Bathing/Showering ° °You may shower after you go home. Keep your incision dry for 48 hours. Do not soak in a bathtub, hot tub, or swim until the incision heals completely. You may not shower if you have a hemodialysis catheter. ° °Incision Care ° °Clean your incision with mild soap and water after 48 hours. Pat the area dry with a clean towel. You do not need a bandage unless otherwise instructed. Do not apply any ointments or creams to your incision. You may have skin glue on your incision. Do not peel it off. It will come off on its own in about one week. Your arm may swell a bit after surgery. To reduce swelling use pillows to elevate your arm so it is above your heart. Your doctor will tell you if you need to lightly wrap your arm with an ACE bandage. ° °Diet ° °Resume your normal diet. There are not special food restrictions following this procedure. In order to heal from your surgery, it is CRITICAL to get adequate nutrition. Your body requires vitamins, minerals, and protein. Vegetables are the best source of vitamins and minerals. Vegetables also provide the perfect balance of protein. Processed food has little nutritional value, so try to avoid this. ° °Medications ° °Resume taking all of your medications. If your incision is causing pain, you may take over-the counter pain relievers such as acetaminophen (Tylenol). If you were prescribed a stronger pain medication, please be aware these medications can cause nausea and constipation. Prevent  nausea by taking the medication with a snack or meal. Avoid constipation by drinking plenty of fluids and eating foods with high amount of fiber, such as fruits, vegetables, and grains. Do not take Tylenol if you are taking prescription pain medications. ° ° ° ° °Follow up °Your surgeon may want to see you in the office following your access surgery. If so, this will be arranged at the time of your surgery. ° °Please call us immediately for any of the following conditions: ° °Increased pain, redness, drainage (pus) from your incision site °Fever of 101 degrees or higher °Severe or worsening pain at your incision site °Hand pain or numbness. ° °Reduce your risk of vascular disease: ° °Stop smoking. If you would like help, call [facility] at 1-800-QUIT-NOW (1-800-784-8669) or [place] at 336-586-4000 ° °Manage your cholesterol °Maintain a desired weight °Control your diabetes °Keep your blood pressure down ° °Dialysis ° °It will take several weeks to several months for your new dialysis access to be ready for use. Your surgeon will determine when it is OK to use it. Your nephrologist will continue to direct your dialysis. You can continue to use your Permcath until your new access is ready for use. ° °If you have any questions, please call the office at [phone]. ° °

## 2019-12-11 NOTE — Op Note (Signed)
    Patient name: [name] [name] MRN: [id number] DOB: 1981-06-27 Sex: female  12/11/2019 Pre-operative Diagnosis: End-stage renal disease Post-operative diagnosis:  Same Surgeon:  Erlene Quan C. Donzetta Matters, MD Assistant: Arlee Muslim, PA Procedure Performed: Left arm cephalic vein fistula transposition  Indications: 39 year old female with end-stage renal disease currently dialyzing via catheter.  She is undergone a cephalic vein fistula creation on the left which has matured nicely but is unfortunately too deep for use.  She is now indicated for superficializaton versus transposition.  Findings: The pain throughout his course was well matured.  There was noted to be 1 sclerotic area on duplex this is where we transected the fistula sewed end-to-end.  At completion there was a strong thrill and a palpable radial artery pulse the wrist.   Procedure:  The patient was identified in the holding area and taken to the operating room where she is placed upon the operative table and MAC anesthesia induced.  She was gently prepped draped in the left arm the usual fashion antibiotics were administered and a timeout was called.  1% lidocaine was administered overlying the palpable thrill throughout the upper arm.  Ultrasound was used to identify the fistula.  2 incisions were made over the fistula.  We dissected out the fistula dividing branches between clips and ties.  We marked the fistula for orientation.  There was one area of sclerosis that was identified by ultrasound also notable externally.  We elected to divide the fistula here.  Clamped the fistula proximal to this.  We then divided there.  We spatulated both ends.  I tunneled the fistula laterally and flushed with heparinized saline.  I then sewed end-to-end with 6-0 Prolene suture.  Upon completion there was a strong thrill in the fistula.  There is a palpable radial artery pulse the wrist.  We thinned the skin over the top of the fistula.  We then closed  the incisions with 4 Monocryl and Dermabond is placed at the level of the skin.  She was awake from anesthesia having tolerated procedure without any complication.  All counts were correct at completion.  EBL: 20 cc   Senora Lacson C. Donzetta Matters, MD [facility] Office: [phone] Pager: [phone]

## 2019-12-11 NOTE — H&P (Addendum)
History and Physical Update    History of Present Illness   [name] [name] is a 39 y.o. (1981/05/04) female who presents for re-evaluation of permanent access.  Prior access surgeries include right brachiocephalic fistula by Dr. Scot Dock in August 2017.  She then underwent revision by plication by Dr. Donnetta Hutching in November 2020.  Fistula promptly thrombosed and a TDC was placed.  She recently had left brachiocephalic fistula creation by Dr. Donzetta Matters on 10/07/2019.  She is dialyzing without complications from right IJ TDC despite known right central venous occlusion.  She is dialyzing on a Monday Wednesday Friday schedule.  The patient's PMH, PSH, SH, and FamHx were reviewed and are unchanged from prior visit.        Current Outpatient Medications  Medication Sig Dispense Refill  . b complex-vitamin c-folic acid (NEPHRO-VITE) 0.8 MG TABS tablet Take 1 tablet by mouth at bedtime.     . cinacalcet (SENSIPAR) 30 MG tablet Take 30 mg by mouth every evening.     . dicyclomine (BENTYL) 10 MG capsule Take 1 capsule (10 mg total) by mouth 3 (three) times daily as needed for spasms. 90 capsule 5  . dicyclomine (BENTYL) 10 MG capsule Take 1 capsule (10 mg total) by mouth 3 (three) times daily before meals. As needed for abdominal pain 90 capsule 5  . ferric citrate (AURYXIA) 1 GM 210 MG(Fe) tablet Take 630 mg by mouth 3 (three) times daily with meals.     . hydroxychloroquine (PLAQUENIL) 200 MG tablet TAKE 1 TABLET BY MOUTH TWICE A DAY (Patient taking differently: Take 200 mg by mouth 2 (two) times daily. ) 60 tablet 1  . lidocaine-prilocaine (EMLA) cream Apply 1 application topically daily as needed. (Patient taking differently: Apply 1 application topically daily as needed (prior to port accessed.). ) 30 g 0  . macitentan (OPSUMIT) 10 MG tablet Take 10 mg by mouth daily.    . midodrine (PROAMATINE) 10 MG tablet Take 10 mg by mouth every Monday, Wednesday, and Friday with hemodialysis.    Marland Kitchen  ondansetron (ZOFRAN-ODT) 4 MG disintegrating tablet Take 1 tablet (4 mg total) by mouth every 8 (eight) hours as needed for nausea or vomiting. 30 tablet 2  . pantoprazole (PROTONIX) 40 MG tablet Take 1 tablet (40 mg total) by mouth daily. 90 tablet 1  . predniSONE (DELTASONE) 5 MG tablet Take 5 mg by mouth every evening.    . triamcinolone ointment (KENALOG) 0.1 % Apply 1 application topically daily as needed (FOR RASH/SKIN IRRITATION.).    Marland Kitchen HYDROcodone-acetaminophen (NORCO/VICODIN) 5-325 MG tablet Take 1 tablet by mouth every 6 (six) hours as needed for up to 10 doses for moderate pain. 10 tablet 0   No current facility-administered medications for this visit.    On ROS today: 10 system ROS negative   Physical Examination      Vitals:   11/10/19 1326  BP: 97/63  Pulse: (!) 115  Resp: 16  Temp: (!) 97.2 F (36.2 C)  TempSrc: Temporal  SpO2: 99%  Weight: 198 lb (89.8 kg)  Height: 5\' 7"  (1.702 m)   Body mass index is 31.01 kg/m.  General Alert, O x 3, WD, NAD  Pulmonary Sym exp, good B air movt, CTA B  Cardiac RRR, Nl S1, S2, no Murmurs,  Vascular Vessel Right Left  Radial Palpable Palpable  Brachial Palpable Palpable  Ulnar Not palpable Not palpable    Musculo- skeletal M/S 5/5 throughout  , Extremities without ischemic changes; easily palpable  thrill near anastomosis then fistula takes a deeper course into distal upper arm    Neurologic A&O; CN grossly intact     Non-invasive Vascular Imaging   left Arm Access Duplex:   Diameters:  >6 mm  Depth:  10 mm    Medical Decision Making   [name] [name] is a 39 y.o. female who presents with ESRD requiring hemodialysis.    Patent left brachiocephalic fistula  Based on fistula duplex; fistula runs deeper than 7 mm throughout its course  Plan will be for superficialization of the left brachiocephalic fistula   Risk, benefits, and alternatives to access surgery were discussed.    The  patient is aware the risks include but are not limited to: bleeding, infection, steal syndrome, nerve damage, thrombosis, failure to mature, and need for additional procedures.    The patient agrees to proceed with the procedure.     Purva Vessell C. Donzetta Matters, MD [facility] [place] [facility] Office: [phone] Pager: [phone]

## 2019-12-11 NOTE — Transfer of Care (Signed)
Immediate Anesthesia Transfer of Care Note  Patient: [name] [name]  Procedure(s) Performed: LEFT ARM ARTERIOVENOUS FISTULA  TRANSPOSITION (Left Arm Upper)  Patient Location: PACU  Anesthesia Type:MAC  Level of Consciousness: awake and patient cooperative  Airway & Oxygen Therapy: Patient Spontanous Breathing and Patient connected to face mask oxygen  Post-op Assessment: Report given to RN and Post -op Vital signs reviewed and stable  Post vital signs: Reviewed and stable  Last Vitals:  Vitals Value Taken Time  BP 85/60 12/11/19 0836  Temp    Pulse    Resp 16 12/11/19 0837  SpO2    Vitals shown include unvalidated device data.  Last Pain:  Vitals:   12/11/19 0601  TempSrc: Oral  PainSc:       Patients Stated Pain Goal: 3 (42/35/36 1443)  Complications: No apparent anesthesia complications

## 2019-12-11 NOTE — Anesthesia Postprocedure Evaluation (Signed)
Anesthesia Post Note  Patient: [name] [name]  Procedure(s) Performed: LEFT ARM ARTERIOVENOUS FISTULA  TRANSPOSITION (Left Arm Upper)     Patient location during evaluation: PACU Anesthesia Type: MAC Level of consciousness: awake and alert Pain management: pain level controlled Vital Signs Assessment: post-procedure vital signs reviewed and stable Respiratory status: spontaneous breathing, nonlabored ventilation, respiratory function stable and patient connected to nasal cannula oxygen Cardiovascular status: stable and blood pressure returned to baseline Postop Assessment: no apparent nausea or vomiting Anesthetic complications: no    Last Vitals:  Vitals:   12/11/19 0905 12/11/19 0920  BP: (!) 94/56 105/63  Pulse: 88 84  Resp: 17 14  Temp:  36.6 C  SpO2: 100% 100%    Last Pain:  Vitals:   12/11/19 0905  TempSrc:   PainSc: 5                  Tiajuana Amass

## 2019-12-12 DIAGNOSIS — N186 End stage renal disease: Secondary | ICD-10-CM | POA: Diagnosis not present

## 2019-12-12 DIAGNOSIS — D631 Anemia in chronic kidney disease: Secondary | ICD-10-CM | POA: Diagnosis not present

## 2019-12-12 DIAGNOSIS — D509 Iron deficiency anemia, unspecified: Secondary | ICD-10-CM | POA: Diagnosis not present

## 2019-12-12 DIAGNOSIS — Z992 Dependence on renal dialysis: Secondary | ICD-10-CM | POA: Diagnosis not present

## 2019-12-12 DIAGNOSIS — N2581 Secondary hyperparathyroidism of renal origin: Secondary | ICD-10-CM | POA: Diagnosis not present

## 2019-12-15 DIAGNOSIS — N186 End stage renal disease: Secondary | ICD-10-CM | POA: Diagnosis not present

## 2019-12-15 DIAGNOSIS — N2581 Secondary hyperparathyroidism of renal origin: Secondary | ICD-10-CM | POA: Diagnosis not present

## 2019-12-15 DIAGNOSIS — D631 Anemia in chronic kidney disease: Secondary | ICD-10-CM | POA: Diagnosis not present

## 2019-12-15 DIAGNOSIS — D509 Iron deficiency anemia, unspecified: Secondary | ICD-10-CM | POA: Diagnosis not present

## 2019-12-15 DIAGNOSIS — Z992 Dependence on renal dialysis: Secondary | ICD-10-CM | POA: Diagnosis not present

## 2019-12-17 ENCOUNTER — Other Ambulatory Visit: Payer: Self-pay | Admitting: Gastroenterology

## 2019-12-17 DIAGNOSIS — Z992 Dependence on renal dialysis: Secondary | ICD-10-CM | POA: Diagnosis not present

## 2019-12-17 DIAGNOSIS — D509 Iron deficiency anemia, unspecified: Secondary | ICD-10-CM | POA: Diagnosis not present

## 2019-12-17 DIAGNOSIS — N2581 Secondary hyperparathyroidism of renal origin: Secondary | ICD-10-CM | POA: Diagnosis not present

## 2019-12-17 DIAGNOSIS — D631 Anemia in chronic kidney disease: Secondary | ICD-10-CM | POA: Diagnosis not present

## 2019-12-17 DIAGNOSIS — N186 End stage renal disease: Secondary | ICD-10-CM | POA: Diagnosis not present

## 2019-12-19 DIAGNOSIS — D509 Iron deficiency anemia, unspecified: Secondary | ICD-10-CM | POA: Diagnosis not present

## 2019-12-19 DIAGNOSIS — Z992 Dependence on renal dialysis: Secondary | ICD-10-CM | POA: Diagnosis not present

## 2019-12-19 DIAGNOSIS — N2581 Secondary hyperparathyroidism of renal origin: Secondary | ICD-10-CM | POA: Diagnosis not present

## 2019-12-19 DIAGNOSIS — N186 End stage renal disease: Secondary | ICD-10-CM | POA: Diagnosis not present

## 2019-12-19 DIAGNOSIS — D631 Anemia in chronic kidney disease: Secondary | ICD-10-CM | POA: Diagnosis not present

## 2019-12-22 DIAGNOSIS — N19 Unspecified kidney failure: Secondary | ICD-10-CM | POA: Diagnosis not present

## 2019-12-22 DIAGNOSIS — Z992 Dependence on renal dialysis: Secondary | ICD-10-CM | POA: Diagnosis not present

## 2019-12-22 DIAGNOSIS — I272 Pulmonary hypertension, unspecified: Secondary | ICD-10-CM | POA: Diagnosis not present

## 2019-12-22 DIAGNOSIS — D509 Iron deficiency anemia, unspecified: Secondary | ICD-10-CM | POA: Diagnosis not present

## 2019-12-22 DIAGNOSIS — D631 Anemia in chronic kidney disease: Secondary | ICD-10-CM | POA: Diagnosis not present

## 2019-12-22 DIAGNOSIS — N2581 Secondary hyperparathyroidism of renal origin: Secondary | ICD-10-CM | POA: Diagnosis not present

## 2019-12-22 DIAGNOSIS — N186 End stage renal disease: Secondary | ICD-10-CM | POA: Diagnosis not present

## 2019-12-22 DIAGNOSIS — I158 Other secondary hypertension: Secondary | ICD-10-CM | POA: Diagnosis not present

## 2019-12-22 DIAGNOSIS — M3219 Other organ or system involvement in systemic lupus erythematosus: Secondary | ICD-10-CM | POA: Diagnosis not present

## 2019-12-23 IMAGING — DX DG CHEST 2V
2 series · 2 of 2 positions shown · non-contrast
Comparison: July 11, 2018

CLINICAL DATA: Pain

EXAM:
CHEST - 2 VIEW

[chest pa]
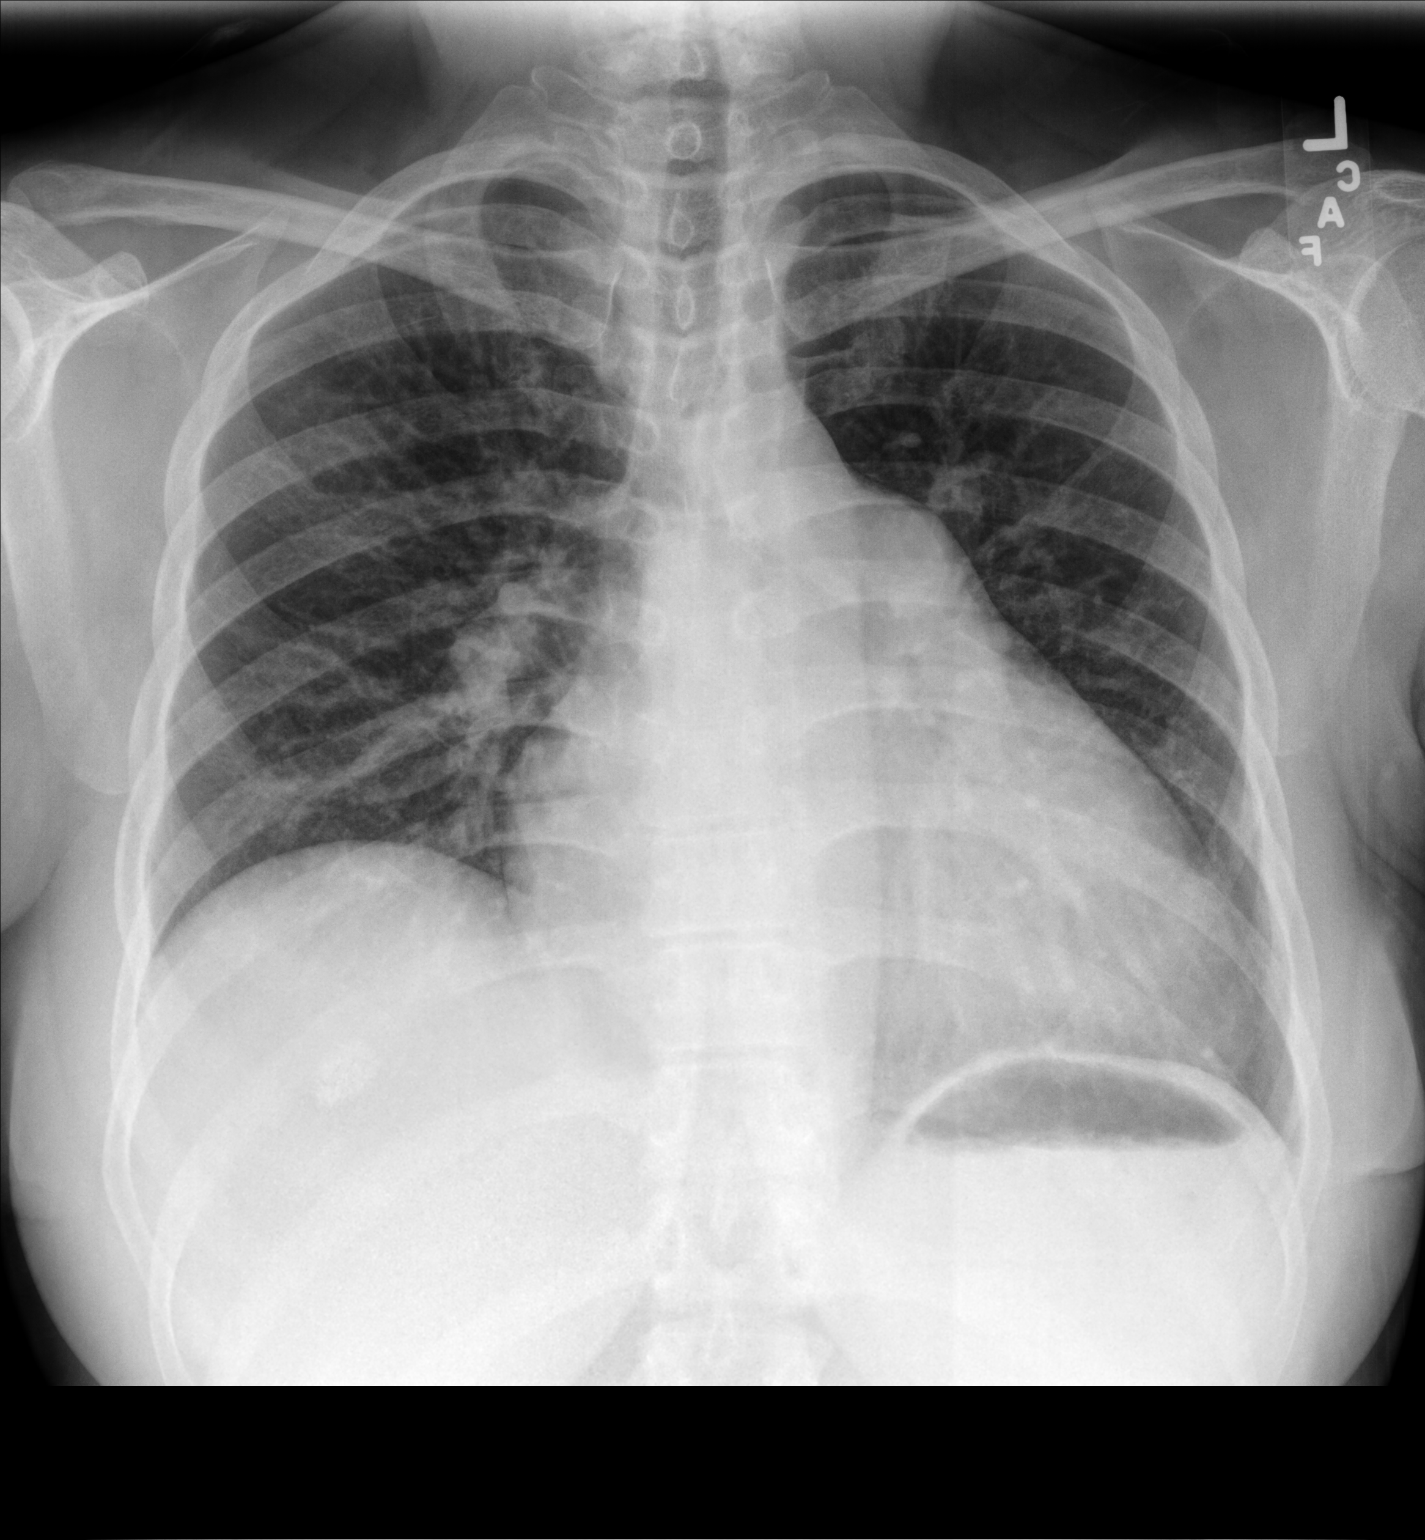

[chest lat]
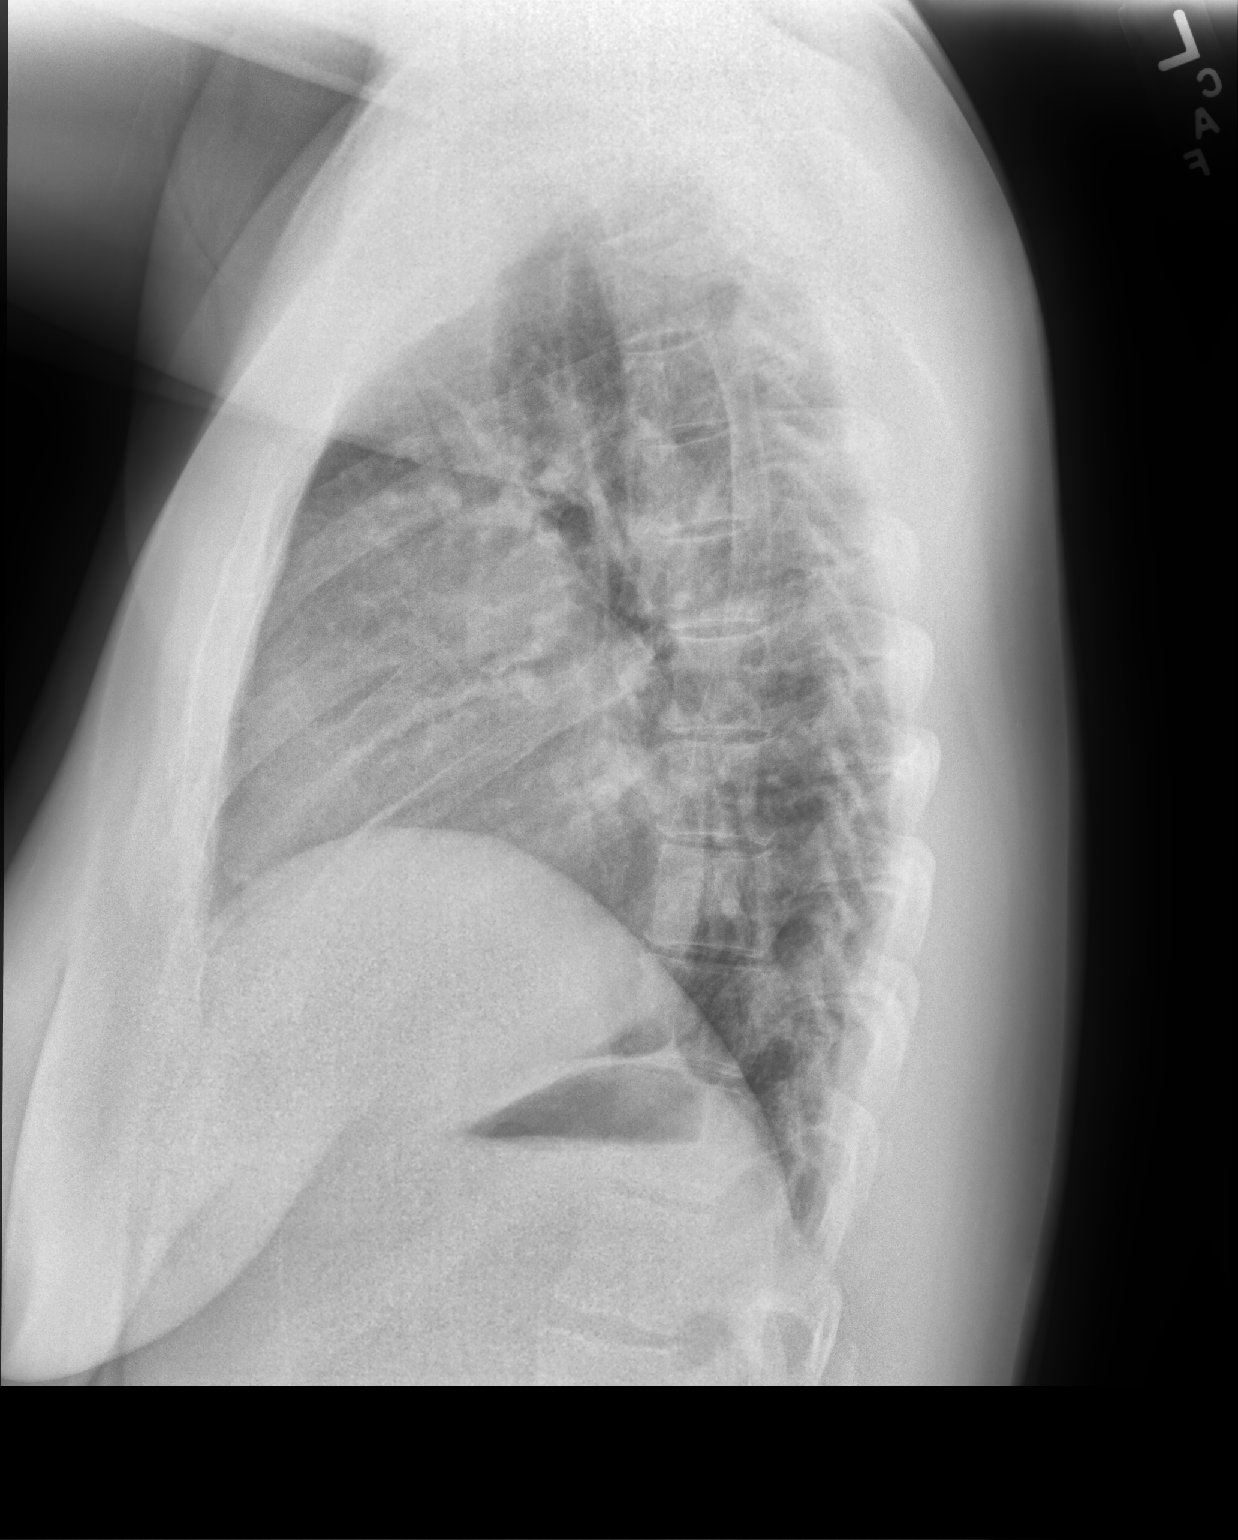

[2 of 2 positions shown; findings below may reference images not displayed]

FINDINGS: There is cardiomegaly, stable. There is no edema or consolidation.
The pulmonary vascularity is normal. No adenopathy. No bone lesions.
There is calcification in the upper abdominal region.
IMPRESSION: Stable cardiomegaly.  No edema or consolidation.

Calcification in right upper quadrant region. Etiology uncertain.
Potential cholelithiasis.

## 2019-12-24 DIAGNOSIS — Z992 Dependence on renal dialysis: Secondary | ICD-10-CM | POA: Diagnosis not present

## 2019-12-24 DIAGNOSIS — N19 Unspecified kidney failure: Secondary | ICD-10-CM | POA: Diagnosis not present

## 2019-12-24 DIAGNOSIS — N186 End stage renal disease: Secondary | ICD-10-CM | POA: Diagnosis not present

## 2019-12-24 DIAGNOSIS — I272 Pulmonary hypertension, unspecified: Secondary | ICD-10-CM | POA: Diagnosis not present

## 2019-12-24 DIAGNOSIS — M3219 Other organ or system involvement in systemic lupus erythematosus: Secondary | ICD-10-CM | POA: Diagnosis not present

## 2019-12-24 DIAGNOSIS — D631 Anemia in chronic kidney disease: Secondary | ICD-10-CM | POA: Diagnosis not present

## 2019-12-24 DIAGNOSIS — D509 Iron deficiency anemia, unspecified: Secondary | ICD-10-CM | POA: Diagnosis not present

## 2019-12-24 DIAGNOSIS — N2581 Secondary hyperparathyroidism of renal origin: Secondary | ICD-10-CM | POA: Diagnosis not present

## 2019-12-26 DIAGNOSIS — D509 Iron deficiency anemia, unspecified: Secondary | ICD-10-CM | POA: Diagnosis not present

## 2019-12-26 DIAGNOSIS — N186 End stage renal disease: Secondary | ICD-10-CM | POA: Diagnosis not present

## 2019-12-26 DIAGNOSIS — D631 Anemia in chronic kidney disease: Secondary | ICD-10-CM | POA: Diagnosis not present

## 2019-12-26 DIAGNOSIS — Z992 Dependence on renal dialysis: Secondary | ICD-10-CM | POA: Diagnosis not present

## 2019-12-26 DIAGNOSIS — N2581 Secondary hyperparathyroidism of renal origin: Secondary | ICD-10-CM | POA: Diagnosis not present

## 2019-12-29 DIAGNOSIS — N186 End stage renal disease: Secondary | ICD-10-CM | POA: Diagnosis not present

## 2019-12-29 DIAGNOSIS — D631 Anemia in chronic kidney disease: Secondary | ICD-10-CM | POA: Diagnosis not present

## 2019-12-29 DIAGNOSIS — Z992 Dependence on renal dialysis: Secondary | ICD-10-CM | POA: Diagnosis not present

## 2019-12-29 DIAGNOSIS — D509 Iron deficiency anemia, unspecified: Secondary | ICD-10-CM | POA: Diagnosis not present

## 2019-12-29 DIAGNOSIS — N2581 Secondary hyperparathyroidism of renal origin: Secondary | ICD-10-CM | POA: Diagnosis not present

## 2019-12-31 DIAGNOSIS — D631 Anemia in chronic kidney disease: Secondary | ICD-10-CM | POA: Diagnosis not present

## 2019-12-31 DIAGNOSIS — Z992 Dependence on renal dialysis: Secondary | ICD-10-CM | POA: Diagnosis not present

## 2019-12-31 DIAGNOSIS — N2581 Secondary hyperparathyroidism of renal origin: Secondary | ICD-10-CM | POA: Diagnosis not present

## 2019-12-31 DIAGNOSIS — N186 End stage renal disease: Secondary | ICD-10-CM | POA: Diagnosis not present

## 2019-12-31 DIAGNOSIS — D509 Iron deficiency anemia, unspecified: Secondary | ICD-10-CM | POA: Diagnosis not present

## 2020-01-02 DIAGNOSIS — N2581 Secondary hyperparathyroidism of renal origin: Secondary | ICD-10-CM | POA: Diagnosis not present

## 2020-01-02 DIAGNOSIS — D631 Anemia in chronic kidney disease: Secondary | ICD-10-CM | POA: Diagnosis not present

## 2020-01-02 DIAGNOSIS — Z992 Dependence on renal dialysis: Secondary | ICD-10-CM | POA: Diagnosis not present

## 2020-01-02 DIAGNOSIS — D509 Iron deficiency anemia, unspecified: Secondary | ICD-10-CM | POA: Diagnosis not present

## 2020-01-02 DIAGNOSIS — N186 End stage renal disease: Secondary | ICD-10-CM | POA: Diagnosis not present

## 2020-01-05 DIAGNOSIS — Z992 Dependence on renal dialysis: Secondary | ICD-10-CM | POA: Diagnosis not present

## 2020-01-05 DIAGNOSIS — D631 Anemia in chronic kidney disease: Secondary | ICD-10-CM | POA: Diagnosis not present

## 2020-01-05 DIAGNOSIS — N186 End stage renal disease: Secondary | ICD-10-CM | POA: Diagnosis not present

## 2020-01-05 DIAGNOSIS — D509 Iron deficiency anemia, unspecified: Secondary | ICD-10-CM | POA: Diagnosis not present

## 2020-01-05 DIAGNOSIS — N2581 Secondary hyperparathyroidism of renal origin: Secondary | ICD-10-CM | POA: Diagnosis not present

## 2020-01-07 DIAGNOSIS — N186 End stage renal disease: Secondary | ICD-10-CM | POA: Diagnosis not present

## 2020-01-07 DIAGNOSIS — D509 Iron deficiency anemia, unspecified: Secondary | ICD-10-CM | POA: Diagnosis not present

## 2020-01-07 DIAGNOSIS — N2581 Secondary hyperparathyroidism of renal origin: Secondary | ICD-10-CM | POA: Diagnosis not present

## 2020-01-07 DIAGNOSIS — Z992 Dependence on renal dialysis: Secondary | ICD-10-CM | POA: Diagnosis not present

## 2020-01-07 DIAGNOSIS — D631 Anemia in chronic kidney disease: Secondary | ICD-10-CM | POA: Diagnosis not present

## 2020-01-09 ENCOUNTER — Telehealth (HOSPITAL_COMMUNITY): Payer: Self-pay

## 2020-01-09 DIAGNOSIS — N2581 Secondary hyperparathyroidism of renal origin: Secondary | ICD-10-CM | POA: Diagnosis not present

## 2020-01-09 DIAGNOSIS — D631 Anemia in chronic kidney disease: Secondary | ICD-10-CM | POA: Diagnosis not present

## 2020-01-09 DIAGNOSIS — D509 Iron deficiency anemia, unspecified: Secondary | ICD-10-CM | POA: Diagnosis not present

## 2020-01-09 DIAGNOSIS — N186 End stage renal disease: Secondary | ICD-10-CM | POA: Diagnosis not present

## 2020-01-09 DIAGNOSIS — Z992 Dependence on renal dialysis: Secondary | ICD-10-CM | POA: Diagnosis not present

## 2020-01-09 NOTE — Telephone Encounter (Signed)

## 2020-01-12 ENCOUNTER — Other Ambulatory Visit: Payer: Self-pay

## 2020-01-12 ENCOUNTER — Ambulatory Visit (INDEPENDENT_AMBULATORY_CARE_PROVIDER_SITE_OTHER): Payer: Self-pay | Admitting: Physician Assistant

## 2020-01-12 VITALS — BP 101/69 | HR 112 | Temp 97.8°F | Resp 14 | Ht 67.0 in | Wt 202.0 lb

## 2020-01-12 DIAGNOSIS — N186 End stage renal disease: Secondary | ICD-10-CM | POA: Diagnosis not present

## 2020-01-12 DIAGNOSIS — D509 Iron deficiency anemia, unspecified: Secondary | ICD-10-CM | POA: Diagnosis not present

## 2020-01-12 DIAGNOSIS — D631 Anemia in chronic kidney disease: Secondary | ICD-10-CM | POA: Diagnosis not present

## 2020-01-12 DIAGNOSIS — Z992 Dependence on renal dialysis: Secondary | ICD-10-CM

## 2020-01-12 DIAGNOSIS — N2581 Secondary hyperparathyroidism of renal origin: Secondary | ICD-10-CM | POA: Diagnosis not present

## 2020-01-12 NOTE — Progress Notes (Addendum)
POST OPERATIVE OFFICE NOTE    CC:  F/u for surgery  HPI:  This is a 39 y.o. female who is s/p left arm second stage cephalic vein fistula transposition by Dr. Donzetta Matters on 12/11/19. She presents for follow up today.She denies any upper extremity steal symptoms. Her left upper extremity is well healed. She says she has mild incision site soreness if the area is compressed otherwise now left upper extremity pain  Her initial left brachiocephalic AV fistula was performed on 10/07/19 by Dr. Donzetta Matters. She however has history of right brachiocephalic fistula by Dr. Scot Dock in August of 2017. She had revision/ plication of this fistula by Dr. Donnetta Hutching in November 2020. This right upper extremity fistula thrombosed and she had to have a  TDC placed.  She has known right central venous occlusion. She has been dialyzing via a right IJ [facility] on Monday, Wedneday and Fridays. TDC has been functioning well.   Allergies  Allergen Reactions  . Lisinopril Swelling    SWELLING OF TOP LIP  . Nsaids Other (See Comments)    MD TOLD PATIENT TO AVOID NSAIDS DUE TO RENAL INSUFFICIENY.    Current Outpatient Medications  Medication Sig Dispense Refill  . b complex-vitamin c-folic acid (NEPHRO-VITE) 0.8 MG TABS tablet Take 1 tablet by mouth at bedtime.     . cinacalcet (SENSIPAR) 30 MG tablet Take 30 mg by mouth every evening.     Marland Kitchen desonide (DESOWEN) 0.05 % ointment Apply 1 application topically 2 (two) times daily as needed (skin irritation (face)).    Marland Kitchen dicyclomine (BENTYL) 10 MG capsule Take 1 capsule (10 mg total) by mouth 3 (three) times daily as needed for spasms. 90 capsule 5  . ferric citrate (AURYXIA) 1 GM 210 MG(Fe) tablet Take 630 mg by mouth 3 (three) times daily with meals.     . hydroxychloroquine (PLAQUENIL) 200 MG tablet TAKE 1 TABLET BY MOUTH TWICE A DAY (Patient taking differently: Take 200 mg by mouth 2 (two) times daily. ) 60 tablet 1  . lidocaine-prilocaine (EMLA) cream Apply 1 application topically daily  as needed. (Patient taking differently: Apply 1 application topically daily as needed (prior to port accessed.). ) 30 g 0  . macitentan (OPSUMIT) 10 MG tablet Take 10 mg by mouth daily.    . midodrine (PROAMATINE) 10 MG tablet Take 10 mg by mouth every Monday, Wednesday, and Friday with hemodialysis.    Marland Kitchen ondansetron (ZOFRAN-ODT) 4 MG disintegrating tablet Take 1 tablet (4 mg total) by mouth every 8 (eight) hours as needed for nausea or vomiting. 30 tablet 2  . pantoprazole (PROTONIX) 40 MG tablet TAKE 1 TABLET BY MOUTH EVERY DAY 90 tablet 0  . predniSONE (DELTASONE) 5 MG tablet Take 5 mg by mouth every evening.    . triamcinolone ointment (KENALOG) 0.1 % Apply 1 application topically daily as needed (FOR RASH/SKIN IRRITATION.).    Marland Kitchen HYDROcodone-acetaminophen (NORCO/VICODIN) 5-325 MG tablet Take 1 tablet by mouth every 6 (six) hours as needed for up to 15 doses for moderate pain. 15 tablet 0   No current facility-administered medications for this visit.     MWU:XLKGMW of Systems  Constitutional: Negative for chills and fever.  Respiratory: Negative for cough and shortness of breath.   Cardiovascular: Negative for chest pain, claudication and leg swelling.  Gastrointestinal: Negative for abdominal pain, constipation, diarrhea, nausea and vomiting.  Musculoskeletal: Negative for joint pain.  Neurological: Negative for dizziness, weakness and headaches.     Physical Exam:  Vitals:  01/12/20 1457  BP: 101/69  Pulse: (!) 112  Resp: 14  Temp: 97.8 F (36.6 C)  TempSrc: Temporal  SpO2: 98%  Weight: 202 lb (91.6 kg)  Height: 5\' 7"  (1.702 m)   General: well appearing, well nourished, not in any distress Cardiac: regular rate and rhythm, no murmur Lungs: clear to auscultation bilaterally, equal expansion Incision:  Left upper extremity incisions, clean, dry and intact. Extremities:2+ radial and brachial pulses bilaterally. Both hands warm. Motor and sensory intact. Great thrill in left  upper arm fistula, good bruit Neuro: Alert and oriented  Assessment/Plan:  This is a 39 y.o. female who is s/p left arm cephalic vein fistula transposition 12/11/19 by Dr. Donzetta Matters. Left upper extremity incisions are healed. She has good bruit/thrill. - she is doing well post op without any steal symptoms left upper extremity -Once her fistula has been access and is functioning well to the satisfaction of the dialysis center, she can be scheduled to have her [facility] catheter removed -She will be seen PRN or if she has any issues with her fistula arise  Karoline Caldwell, PA-C [facility] [phone]  Clinic MD:  Dr. Trula Slade

## 2020-01-14 ENCOUNTER — Encounter: Payer: Self-pay | Admitting: Family Medicine

## 2020-01-14 DIAGNOSIS — D631 Anemia in chronic kidney disease: Secondary | ICD-10-CM | POA: Diagnosis not present

## 2020-01-14 DIAGNOSIS — N186 End stage renal disease: Secondary | ICD-10-CM | POA: Diagnosis not present

## 2020-01-14 DIAGNOSIS — N2581 Secondary hyperparathyroidism of renal origin: Secondary | ICD-10-CM | POA: Diagnosis not present

## 2020-01-14 DIAGNOSIS — D509 Iron deficiency anemia, unspecified: Secondary | ICD-10-CM | POA: Diagnosis not present

## 2020-01-14 DIAGNOSIS — Z992 Dependence on renal dialysis: Secondary | ICD-10-CM | POA: Diagnosis not present

## 2020-01-16 DIAGNOSIS — Z992 Dependence on renal dialysis: Secondary | ICD-10-CM | POA: Diagnosis not present

## 2020-01-16 DIAGNOSIS — N186 End stage renal disease: Secondary | ICD-10-CM | POA: Diagnosis not present

## 2020-01-16 DIAGNOSIS — D631 Anemia in chronic kidney disease: Secondary | ICD-10-CM | POA: Diagnosis not present

## 2020-01-16 DIAGNOSIS — D509 Iron deficiency anemia, unspecified: Secondary | ICD-10-CM | POA: Diagnosis not present

## 2020-01-16 DIAGNOSIS — N2581 Secondary hyperparathyroidism of renal origin: Secondary | ICD-10-CM | POA: Diagnosis not present

## 2020-01-19 DIAGNOSIS — Z992 Dependence on renal dialysis: Secondary | ICD-10-CM | POA: Diagnosis not present

## 2020-01-19 DIAGNOSIS — D509 Iron deficiency anemia, unspecified: Secondary | ICD-10-CM | POA: Diagnosis not present

## 2020-01-19 DIAGNOSIS — N186 End stage renal disease: Secondary | ICD-10-CM | POA: Diagnosis not present

## 2020-01-19 DIAGNOSIS — N2581 Secondary hyperparathyroidism of renal origin: Secondary | ICD-10-CM | POA: Diagnosis not present

## 2020-01-19 DIAGNOSIS — D631 Anemia in chronic kidney disease: Secondary | ICD-10-CM | POA: Diagnosis not present

## 2020-01-21 DIAGNOSIS — D631 Anemia in chronic kidney disease: Secondary | ICD-10-CM | POA: Diagnosis not present

## 2020-01-21 DIAGNOSIS — Z992 Dependence on renal dialysis: Secondary | ICD-10-CM | POA: Diagnosis not present

## 2020-01-21 DIAGNOSIS — N2581 Secondary hyperparathyroidism of renal origin: Secondary | ICD-10-CM | POA: Diagnosis not present

## 2020-01-21 DIAGNOSIS — D509 Iron deficiency anemia, unspecified: Secondary | ICD-10-CM | POA: Diagnosis not present

## 2020-01-21 DIAGNOSIS — N186 End stage renal disease: Secondary | ICD-10-CM | POA: Diagnosis not present

## 2020-01-22 DIAGNOSIS — I158 Other secondary hypertension: Secondary | ICD-10-CM | POA: Diagnosis not present

## 2020-01-22 DIAGNOSIS — Z992 Dependence on renal dialysis: Secondary | ICD-10-CM | POA: Diagnosis not present

## 2020-01-22 DIAGNOSIS — N186 End stage renal disease: Secondary | ICD-10-CM | POA: Diagnosis not present

## 2020-01-23 DIAGNOSIS — Z992 Dependence on renal dialysis: Secondary | ICD-10-CM | POA: Diagnosis not present

## 2020-01-23 DIAGNOSIS — D631 Anemia in chronic kidney disease: Secondary | ICD-10-CM | POA: Diagnosis not present

## 2020-01-23 DIAGNOSIS — N186 End stage renal disease: Secondary | ICD-10-CM | POA: Diagnosis not present

## 2020-01-23 DIAGNOSIS — D509 Iron deficiency anemia, unspecified: Secondary | ICD-10-CM | POA: Diagnosis not present

## 2020-01-23 DIAGNOSIS — N2581 Secondary hyperparathyroidism of renal origin: Secondary | ICD-10-CM | POA: Diagnosis not present

## 2020-01-26 DIAGNOSIS — Z992 Dependence on renal dialysis: Secondary | ICD-10-CM | POA: Diagnosis not present

## 2020-01-26 DIAGNOSIS — D631 Anemia in chronic kidney disease: Secondary | ICD-10-CM | POA: Diagnosis not present

## 2020-01-26 DIAGNOSIS — N2581 Secondary hyperparathyroidism of renal origin: Secondary | ICD-10-CM | POA: Diagnosis not present

## 2020-01-26 DIAGNOSIS — N186 End stage renal disease: Secondary | ICD-10-CM | POA: Diagnosis not present

## 2020-01-26 DIAGNOSIS — D509 Iron deficiency anemia, unspecified: Secondary | ICD-10-CM | POA: Diagnosis not present

## 2020-01-28 DIAGNOSIS — N186 End stage renal disease: Secondary | ICD-10-CM | POA: Diagnosis not present

## 2020-01-28 DIAGNOSIS — Z992 Dependence on renal dialysis: Secondary | ICD-10-CM | POA: Diagnosis not present

## 2020-01-28 DIAGNOSIS — D509 Iron deficiency anemia, unspecified: Secondary | ICD-10-CM | POA: Diagnosis not present

## 2020-01-28 DIAGNOSIS — N2581 Secondary hyperparathyroidism of renal origin: Secondary | ICD-10-CM | POA: Diagnosis not present

## 2020-01-28 DIAGNOSIS — D631 Anemia in chronic kidney disease: Secondary | ICD-10-CM | POA: Diagnosis not present

## 2020-01-30 DIAGNOSIS — D631 Anemia in chronic kidney disease: Secondary | ICD-10-CM | POA: Diagnosis not present

## 2020-01-30 DIAGNOSIS — N186 End stage renal disease: Secondary | ICD-10-CM | POA: Diagnosis not present

## 2020-01-30 DIAGNOSIS — Z992 Dependence on renal dialysis: Secondary | ICD-10-CM | POA: Diagnosis not present

## 2020-01-30 DIAGNOSIS — D509 Iron deficiency anemia, unspecified: Secondary | ICD-10-CM | POA: Diagnosis not present

## 2020-01-30 DIAGNOSIS — N2581 Secondary hyperparathyroidism of renal origin: Secondary | ICD-10-CM | POA: Diagnosis not present

## 2020-02-02 DIAGNOSIS — D631 Anemia in chronic kidney disease: Secondary | ICD-10-CM | POA: Diagnosis not present

## 2020-02-02 DIAGNOSIS — N186 End stage renal disease: Secondary | ICD-10-CM | POA: Diagnosis not present

## 2020-02-02 DIAGNOSIS — N2581 Secondary hyperparathyroidism of renal origin: Secondary | ICD-10-CM | POA: Diagnosis not present

## 2020-02-02 DIAGNOSIS — Z992 Dependence on renal dialysis: Secondary | ICD-10-CM | POA: Diagnosis not present

## 2020-02-02 DIAGNOSIS — D509 Iron deficiency anemia, unspecified: Secondary | ICD-10-CM | POA: Diagnosis not present

## 2020-02-04 DIAGNOSIS — D631 Anemia in chronic kidney disease: Secondary | ICD-10-CM | POA: Diagnosis not present

## 2020-02-04 DIAGNOSIS — D509 Iron deficiency anemia, unspecified: Secondary | ICD-10-CM | POA: Diagnosis not present

## 2020-02-04 DIAGNOSIS — N186 End stage renal disease: Secondary | ICD-10-CM | POA: Diagnosis not present

## 2020-02-04 DIAGNOSIS — N2581 Secondary hyperparathyroidism of renal origin: Secondary | ICD-10-CM | POA: Diagnosis not present

## 2020-02-04 DIAGNOSIS — Z992 Dependence on renal dialysis: Secondary | ICD-10-CM | POA: Diagnosis not present

## 2020-02-06 DIAGNOSIS — N2581 Secondary hyperparathyroidism of renal origin: Secondary | ICD-10-CM | POA: Diagnosis not present

## 2020-02-06 DIAGNOSIS — Z992 Dependence on renal dialysis: Secondary | ICD-10-CM | POA: Diagnosis not present

## 2020-02-06 DIAGNOSIS — D509 Iron deficiency anemia, unspecified: Secondary | ICD-10-CM | POA: Diagnosis not present

## 2020-02-06 DIAGNOSIS — N186 End stage renal disease: Secondary | ICD-10-CM | POA: Diagnosis not present

## 2020-02-06 DIAGNOSIS — D631 Anemia in chronic kidney disease: Secondary | ICD-10-CM | POA: Diagnosis not present

## 2020-02-09 DIAGNOSIS — D631 Anemia in chronic kidney disease: Secondary | ICD-10-CM | POA: Diagnosis not present

## 2020-02-09 DIAGNOSIS — Z992 Dependence on renal dialysis: Secondary | ICD-10-CM | POA: Diagnosis not present

## 2020-02-09 DIAGNOSIS — N2581 Secondary hyperparathyroidism of renal origin: Secondary | ICD-10-CM | POA: Diagnosis not present

## 2020-02-09 DIAGNOSIS — D509 Iron deficiency anemia, unspecified: Secondary | ICD-10-CM | POA: Diagnosis not present

## 2020-02-09 DIAGNOSIS — N186 End stage renal disease: Secondary | ICD-10-CM | POA: Diagnosis not present

## 2020-02-11 DIAGNOSIS — D509 Iron deficiency anemia, unspecified: Secondary | ICD-10-CM | POA: Diagnosis not present

## 2020-02-11 DIAGNOSIS — Z992 Dependence on renal dialysis: Secondary | ICD-10-CM | POA: Diagnosis not present

## 2020-02-11 DIAGNOSIS — D631 Anemia in chronic kidney disease: Secondary | ICD-10-CM | POA: Diagnosis not present

## 2020-02-11 DIAGNOSIS — N186 End stage renal disease: Secondary | ICD-10-CM | POA: Diagnosis not present

## 2020-02-11 DIAGNOSIS — N2581 Secondary hyperparathyroidism of renal origin: Secondary | ICD-10-CM | POA: Diagnosis not present

## 2020-02-12 ENCOUNTER — Ambulatory Visit (INDEPENDENT_AMBULATORY_CARE_PROVIDER_SITE_OTHER): Payer: Medicare Other

## 2020-02-12 ENCOUNTER — Other Ambulatory Visit: Payer: Self-pay

## 2020-02-12 DIAGNOSIS — N186 End stage renal disease: Secondary | ICD-10-CM | POA: Diagnosis not present

## 2020-02-12 DIAGNOSIS — T82858A Stenosis of vascular prosthetic devices, implants and grafts, initial encounter: Secondary | ICD-10-CM | POA: Diagnosis not present

## 2020-02-12 DIAGNOSIS — Z452 Encounter for adjustment and management of vascular access device: Secondary | ICD-10-CM | POA: Diagnosis not present

## 2020-02-12 DIAGNOSIS — Z992 Dependence on renal dialysis: Secondary | ICD-10-CM | POA: Diagnosis not present

## 2020-02-12 DIAGNOSIS — Z0000 Encounter for general adult medical examination without abnormal findings: Secondary | ICD-10-CM

## 2020-02-12 DIAGNOSIS — I871 Compression of vein: Secondary | ICD-10-CM | POA: Diagnosis not present

## 2020-02-12 NOTE — Progress Notes (Signed)
This visit is being conducted via phone call due to the COVID-19 pandemic. This patient has given me verbal consent via phone to conduct this visit, patient states they are participating from their home address. Some vital signs may be absent or patient reported.   Patient identification: identified by name, DOB, and current address.  Location provider: [facility], Office Persons participating in the virtual visit: Denman George LPN, patient, and Dr. Garret Reddish   Subjective:   [name] [name] is a 39 y.o. female who presents for an Initial Medicare Annual Wellness Visit.  Review of Systems           Objective:    There were no vitals filed for this visit. There is no height or weight on file to calculate BMI.  Advanced Directives 02/12/2020 12/11/2019 10/07/2019 09/02/2019 08/29/2019 08/27/2019 06/24/2019  Does Patient Have a Medical Advance Directive? No No No No No No No  Would patient like information on creating a medical advance directive? Yes (MAU/Ambulatory/Procedural Areas - Information given) No - Patient declined No - Patient declined No - Patient declined - No - Patient declined No - Patient declined    Current Medications (verified) Outpatient Encounter Medications as of 02/12/2020  Medication Sig  . b complex-vitamin c-folic acid (NEPHRO-VITE) 0.8 MG TABS tablet Take 1 tablet by mouth at bedtime.   . cinacalcet (SENSIPAR) 30 MG tablet Take 30 mg by mouth every evening.   Marland Kitchen desonide (DESOWEN) 0.05 % ointment Apply 1 application topically 2 (two) times daily as needed (skin irritation (face)).  Marland Kitchen dicyclomine (BENTYL) 10 MG capsule Take 1 capsule (10 mg total) by mouth 3 (three) times daily as needed for spasms.  . ferric citrate (AURYXIA) 1 GM 210 MG(Fe) tablet Take 630 mg by mouth 3 (three) times daily with meals.   . hydroxychloroquine (PLAQUENIL) 200 MG tablet TAKE 1 TABLET BY MOUTH TWICE A DAY (Patient taking differently: Take 200 mg by mouth 2 (two) times daily.  )  . lidocaine-prilocaine (EMLA) cream Apply 1 application topically daily as needed. (Patient taking differently: Apply 1 application topically daily as needed (prior to port accessed.). )  . macitentan (OPSUMIT) 10 MG tablet Take 10 mg by mouth daily.  . midodrine (PROAMATINE) 10 MG tablet Take 10 mg by mouth every Monday, Wednesday, and Friday with hemodialysis.  Marland Kitchen ondansetron (ZOFRAN-ODT) 4 MG disintegrating tablet Take 1 tablet (4 mg total) by mouth every 8 (eight) hours as needed for nausea or vomiting.  . pantoprazole (PROTONIX) 40 MG tablet TAKE 1 TABLET BY MOUTH EVERY DAY  . predniSONE (DELTASONE) 5 MG tablet Take 5 mg by mouth every evening.  . triamcinolone ointment (KENALOG) 0.1 % Apply 1 application topically daily as needed (FOR RASH/SKIN IRRITATION.).  . [DISCONTINUED] HYDROcodone-acetaminophen (NORCO/VICODIN) 5-325 MG tablet Take 1 tablet by mouth every 6 (six) hours as needed for up to 15 doses for moderate pain.   No facility-administered encounter medications on file as of 02/12/2020.    Allergies (verified) Lisinopril and Nsaids   History: Past Medical History:  Diagnosis Date  . CHF (congestive heart failure) ([place]) 5-6 yrs ago  hosp  . Chronic kidney disease    Hemo MWF [employer]  . Dyspnea    with exertion  . ERYTHEMATOSUS, LUPUS 08/07/2006  . GERD (gastroesophageal reflux disease)   . History of blood transfusion   . Hypertension   . Intestinal infection due to Clostridium difficile   . On supplemental oxygen by nasal cannula  as needed; does not wear CPAP or Bipap  . Pulmonary hypertension ([place])   . Secondary cardiomyopathy, unspecified   . Sleep apnea    does not wear CPAP, pt states she was never diagnosed with it  . Stroke ([place])    11/19/2018 mini stroke -> 10 years no residual effects.  . Systemic lupus erythematosus ([place])   . TIA (transient ischemic attack)   . Unspecified deficiency anemia   . Unspecified essential hypertension    Past Surgical  History:  Procedure Laterality Date  . AV FISTULA PLACEMENT Right 05/23/2016   Procedure: ARTERIOVENOUS (AV) FISTULA CREATION VERSUS GRAFT INSERTION;  Surgeon: Angelia Mould, MD;  Location: [place];  Service: Vascular;  Laterality: Right;  . AV FISTULA PLACEMENT Left 10/07/2019   Procedure: Creation of left arm Brachiocephalic Fistula;  Surgeon: Waynetta Sandy, MD;  Location: [place];  Service: Vascular;  Laterality: Left;  . BIOPSY  08/12/2018   Procedure: BIOPSY;  Surgeon: Ladene Artist, MD;  Location: [facility];  Service: Endoscopy;;  . BREAST SURGERY     biopsy  . CARDIAC CATHETERIZATION    . COLONOSCOPY WITH PROPOFOL N/A 08/12/2018   Procedure: COLONOSCOPY WITH PROPOFOL;  Surgeon: Ladene Artist, MD;  Location: [facility];  Service: Endoscopy;  Laterality: N/A;  . ESOPHAGOGASTRODUODENOSCOPY (EGD) WITH PROPOFOL N/A 08/12/2018   Procedure: ESOPHAGOGASTRODUODENOSCOPY (EGD) WITH PROPOFOL;  Surgeon: Ladene Artist, MD;  Location: [facility];  Service: Endoscopy;  Laterality: N/A;  . FISTULA SUPERFICIALIZATION Left 12/11/2019   Procedure: LEFT ARM ARTERIOVENOUS FISTULA  TRANSPOSITION;  Surgeon: Waynetta Sandy, MD;  Location: [place];  Service: Vascular;  Laterality: Left;  . INSERTION OF DIALYSIS CATHETER N/A 08/29/2019   Procedure: INSERTION OF TUNNEL DIALYSIS CATHETER;  Surgeon: Angelia Mould, MD;  Location[facility] OR;  Service: Vascular;  Laterality: N/A;  . kidney biopsies     to determine lupus nephritis  . REVISON OF ARTERIOVENOUS FISTULA Right 08/27/2019   Procedure: REVISON OF ARTERIOVENOUS FISTULA  AND PLICATION RIGHT ARM;  Surgeon: Rosetta Posner, MD;  Location: [facility] OR;  Service: Vascular;  Laterality: Right;  . UPPER EXTREMITY VENOGRAPHY Bilateral 09/02/2019   Procedure: UPPER EXTREMITY VENOGRAPHY;  Surgeon: Serafina Mitchell, MD;  Location: [place] [facility];  Service: Cardiovascular;  Laterality: Bilateral;   Family History  Problem Relation  Age of Onset  . Lupus Mother   . Cancer Maternal Grandmother        unknown  . Prostate cancer Paternal Grandfather   . Hypertension Father   . Diabetes Brother   . Diabetes Other        mat cousin   Social History   Socioeconomic History  . Marital status: Single    Spouse name: Not on file  . Number of children: 0  . Years of education: Not on file  . Highest education level: Not on file  Occupational History  . Occupation: Education administrator   . Occupation: Scientist, clinical (histocompatibility and immunogenetics): [employer]  Tobacco Use  . Smoking status: Never Smoker  . Smokeless tobacco: Never Used  Substance and Sexual Activity  . Alcohol use: No    Alcohol/week: 0.0 standard drinks  . Drug use: No  . Sexual activity: Never  Other Topics Concern  . Not on file  Social History Narrative   Lives with sister.       Social Determinants of Health   Financial Resource Strain:   . Difficulty of Paying  Living Expenses:   Food Insecurity:   . Worried About Charity fundraiser in the Last Year:   . Arboriculturist in the Last Year:   Transportation Needs:   . Film/video editor (Medical):   Marland Kitchen Lack of Transportation (Non-Medical):   Physical Activity:   . Days of Exercise per Week:   . Minutes of Exercise per Session:   Stress:   . Feeling of Stress :   Social Connections:   . Frequency of Communication with Friends and Family:   . Frequency of Social Gatherings with Friends and Family:   . Attends Religious Services:   . Active Member of Clubs or Organizations:   . Attends Archivist Meetings:   Marland Kitchen Marital Status:     Tobacco Counseling Counseling given: Not Answered   Clinical Intake:  Pre-visit preparation completed: Yes  Pain : No/denies pain  Diabetes: No  How often do you need to have someone help you when you read instructions, pamphlets, or other written materials from your doctor or pharmacy?: 1 - Never  Interpreter Needed?: No  Information entered by ::  Denman George LPN   Activities of Daily Living In your present state of health, do you have any difficulty performing the following activities: 02/12/2020 12/11/2019  Hearing? N -  Vision? N -  Difficulty concentrating or making decisions? N -  Walking or climbing stairs? N -  Dressing or bathing? N -  Doing errands, shopping? N N  Preparing Food and eating ? N -  Using the Toilet? N -  In the past six months, have you accidently leaked urine? N -  Do you have problems with loss of bowel control? N -  Managing your Medications? N -  Managing your Finances? N -  Housekeeping or managing your Housekeeping? N -  Some recent data might be hidden     Immunizations and Health Maintenance Immunization History  Administered Date(s) Administered  . Hepatitis B, adult 10/06/2015, 11/16/2015, 12/16/2015, 05/01/2016  . Influenza Split 07/06/2011, 08/14/2012  . Influenza Whole 06/30/2010  . Influenza,inj,Quad PF,6+ Mos 11/26/2013, 07/23/2019  . Influenza-Unspecified 08/04/2015, 08/16/2018  . Pneumococcal Conjugate-13 09/29/2015  . Pneumococcal Polysaccharide-23 07/08/2016  . Tdap 11/26/2013   Health Maintenance Due  Topic Date Due  . COVID-19 Vaccine (1) Never done    Patient Care Team: Marin Olp, MD as PCP - General (Family Medicine) Bo Merino, MD as Consulting Physician (Rheumatology) Salem Senate, MD as Consulting Physician (Nephrology) Jamal Maes, MD as Consulting Physician (Nephrology) [facility], MD as Consulting Physician (Obstetrics and Gynecology) Minus Breeding, MD as Consulting Physician (Cardiology) Early, Arvilla Meres, MD as Consulting Physician (Vascular Surgery)  Indicate any recent Medical Services you may have received from other than [facility] providers in the past year (date may be approximate).     Assessment:   This is a routine wellness examination for [place].  Hearing/Vision screen No exam data  present  Dietary issues and exercise activities discussed: Current Exercise Habits: The patient does not participate in regular exercise at present  Goals   None    Depression Screen PHQ 2/9 Scores 02/12/2020 07/12/2018 11/07/2011  PHQ - 2 Score 0 0 0    Fall Risk Fall Risk  02/12/2020 07/12/2018  Falls in the past year? - No  Number falls in past yr: 0 -  Injury with Fall? 0 -  Risk for fall due to : Medication side effect -  Follow up  Falls evaluation completed;Education provided;Falls prevention discussed -    Is the patient's home free of loose throw rugs in walkways, pet beds, electrical cords, etc?   yes      Grab bars in the bathroom? yes      Handrails on the stairs?   yes      Adequate lighting?   yes   Cognitive Function: no cognitive concerns at this time     Screening Tests Health Maintenance  Topic Date Due  . COVID-19 Vaccine (1) Never done  . INFLUENZA VACCINE  05/23/2020  . PAP SMEAR-Modifier  07/02/2022  . TETANUS/TDAP  11/27/2023  . HIV Screening  Completed    Qualifies for Shingles Vaccine? Not eligible   Cancer Screenings: Lung: Low Dose CT Chest recommended if Age 57-80 years, 30 pack-year currently smoking OR have quit w/in 15years. Patient does not qualify. Breast: Up to date on Mammogram? Yes   Up to date of Bone Density/Dexa? Yes Colorectal: colonoscopy 08/12/18   Plan:  I have personally reviewed and addressed the Medicare Annual Wellness questionnaire and have noted the following in the patient's chart:  A. Medical and social history B. Use of alcohol, tobacco or illicit drugs  C. Current medications and supplements D. Functional ability and status E.  Nutritional status F.  Physical activity G. Advance directives H. List of other physicians I.  Hospitalizations, surgeries, and ER visits in previous 12 months J.  [place] such as hearing and vision if needed, cognitive and depression L. Referrals, records requested, and  appointments- will request last notes/labs from nephrology   In addition, I have reviewed and discussed with patient certain preventive protocols, quality metrics, and best practice recommendations. A written personalized care plan for preventive services as well as general preventive health recommendations were provided to patient.   Signed,  Denman George, LPN  Nurse Health Advisor   Nurse Notes: no additional

## 2020-02-12 NOTE — Patient Instructions (Signed)
Ms. Minckler , Thank you for taking time to come for your Medicare Wellness Visit. I appreciate your ongoing commitment to your health goals. Please review the following plan we discussed and let me know if I can assist you in the future.   Screening recommendations/referrals: Colorectal Screening: up to date; last colonoscopy 08/12/18 Mammogram: starting at age 41 (see handout for scheduling)  Bone Density: starting at age 74 unless otherwise indicated   Vision and Dental Exams: Recommended annual ophthalmology exams for early detection of glaucoma and other disorders of the eye Recommended annual dental exams for proper oral hygiene  Vaccinations: Influenza vaccine: completed 07/23/19 Pneumococcal vaccine: up to date; last 07/08/16 Tdap vaccine: up to date; last 11/26/13 . Covid vaccine:  Please consider   Advanced directives: I have provided a copy for you to complete at home and have notarized. Once this is complete please bring a copy in to our office so we can scan it into your chart.  Goals: Recommend to drink at least 6-8 8oz glasses of water per day and consume a balanced diet rich in fresh fruits and vegetables. (follow diet recommendations from nephrologist)   Next appointment: Please schedule your Annual Wellness Visit with your Nurse Health Advisor in one year.  Preventive Care 40-64 Years, Female Preventive care refers to lifestyle choices and visits with your health care provider that can promote health and wellness. What does preventive care include?  A yearly physical exam. This is also called an annual well check.  Dental exams once or twice a year.  Routine eye exams. Ask your health care provider how often you should have your eyes checked.  Personal lifestyle choices, including:  Daily care of your teeth and gums.  Regular physical activity.  Eating a healthy diet.  Avoiding tobacco and drug use.  Limiting alcohol use.  Practicing safe sex.  Taking  low-dose aspirin daily starting at age 61 if recommended by your health care provider.  Taking vitamin and mineral supplements as recommended by your health care provider. What happens during an annual well check? The services and screenings done by your health care provider during your annual well check will depend on your age, overall health, lifestyle risk factors, and family history of disease. Counseling  Your health care provider may ask you questions about your:  Alcohol use.  Tobacco use.  Drug use.  Emotional well-being.  Home and relationship well-being.  Sexual activity.  Eating habits.  Work and work Statistician.  Method of birth control.  Menstrual cycle.  Pregnancy history. Screening  You may have the following tests or measurements:  Height, weight, and BMI.  Blood pressure.  Lipid and cholesterol levels. These may be checked every 5 years, or more frequently if you are over 83 years old.  Skin check.  Lung cancer screening. You may have this screening every year starting at age 81 if you have a 30-pack-year history of smoking and currently smoke or have quit within the past 15 years.  Fecal occult blood test (FOBT) of the stool. You may have this test every year starting at age 54.  Flexible sigmoidoscopy or colonoscopy. You may have a sigmoidoscopy every 5 years or a colonoscopy every 10 years starting at age 42.  Hepatitis C blood test.  Hepatitis B blood test.  Sexually transmitted disease (STD) testing.  Diabetes screening. This is done by checking your blood sugar (glucose) after you have not eaten for a while (fasting). You may have this done every  1-3 years.  Mammogram. This may be done every 1-2 years. Talk to your health care provider about when you should start having regular mammograms. This may depend on whether you have a family history of breast cancer.  BRCA-related cancer screening. This may be done if you have a family history  of breast, ovarian, tubal, or peritoneal cancers.  Pelvic exam and Pap test. This may be done every 3 years starting at age 14. Starting at age 52, this may be done every 5 years if you have a Pap test in combination with an HPV test.  Bone density scan. This is done to screen for osteoporosis. You may have this scan if you are at high risk for osteoporosis. Discuss your test results, treatment options, and if necessary, the need for more tests with your health care provider. Vaccines  Your health care provider may recommend certain vaccines, such as:  Influenza vaccine. This is recommended every year.  Tetanus, diphtheria, and acellular pertussis (Tdap, Td) vaccine. You may need a Td booster every 10 years.  Zoster vaccine. You may need this after age 79.  Pneumococcal 13-valent conjugate (PCV13) vaccine. You may need this if you have certain conditions and were not previously vaccinated.  Pneumococcal polysaccharide (PPSV23) vaccine. You may need one or two doses if you smoke cigarettes or if you have certain conditions. Talk to your health care provider about which screenings and vaccines you need and how often you need them. This information is not intended to replace advice given to you by your health care provider. Make sure you discuss any questions you have with your health care provider. Document Released: 11/05/2015 Document Revised: 06/28/2016 Document Reviewed: 08/10/2015 Elsevier Interactive Patient Education  2017 [place] Prevention in the Home Falls can cause injuries. They can happen to people of all ages. There are many things you can do to make your home safe and to help prevent falls. What can I do on the outside of my home?  Regularly fix the edges of walkways and driveways and fix any cracks.  Remove anything that might make you trip as you walk through a door, such as a raised step or threshold.  Trim any bushes or trees on the path to your  home.  Use bright outdoor lighting.  Clear any walking paths of anything that might make someone trip, such as rocks or tools.  Regularly check to see if handrails are loose or broken. Make sure that both sides of any steps have handrails.  Any raised decks and porches should have guardrails on the edges.  Have any leaves, snow, or ice cleared regularly.  Use sand or salt on walking paths during winter.  Clean up any spills in your garage right away. This includes oil or grease spills. What can I do in the bathroom?  Use night lights.  Install grab bars by the toilet and in the tub and shower. Do not use towel bars as grab bars.  Use non-skid mats or decals in the tub or shower.  If you need to sit down in the shower, use a plastic, non-slip stool.  Keep the floor dry. Clean up any water that spills on the floor as soon as it happens.  Remove soap buildup in the tub or shower regularly.  Attach bath mats securely with double-sided non-slip rug tape.  Do not have throw rugs and other things on the floor that can make you trip. What can I do  in the bedroom?  Use night lights.  Make sure that you have a light by your bed that is easy to reach.  Do not use any sheets or blankets that are too big for your bed. They should not hang down onto the floor.  Have a firm chair that has side arms. You can use this for support while you get dressed.  Do not have throw rugs and other things on the floor that can make you trip. What can I do in the kitchen?  Clean up any spills right away.  Avoid walking on wet floors.  Keep items that you use a lot in easy-to-reach places.  If you need to reach something above you, use a strong step stool that has a grab bar.  Keep electrical cords out of the way.  Do not use floor polish or wax that makes floors slippery. If you must use wax, use non-skid floor wax.  Do not have throw rugs and other things on the floor that can make you  trip. What can I do with my stairs?  Do not leave any items on the stairs.  Make sure that there are handrails on both sides of the stairs and use them. Fix handrails that are broken or loose. Make sure that handrails are as long as the stairways.  Check any carpeting to make sure that it is firmly attached to the stairs. Fix any carpet that is loose or worn.  Avoid having throw rugs at the top or bottom of the stairs. If you do have throw rugs, attach them to the floor with carpet tape.  Make sure that you have a light switch at the top of the stairs and the bottom of the stairs. If you do not have them, ask someone to add them for you. What else can I do to help prevent falls?  Wear shoes that:  Do not have high heels.  Have rubber bottoms.  Are comfortable and fit you well.  Are closed at the toe. Do not wear sandals.  If you use a stepladder:  Make sure that it is fully opened. Do not climb a closed stepladder.  Make sure that both sides of the stepladder are locked into place.  Ask someone to hold it for you, if possible.  Clearly mark and make sure that you can see:  Any grab bars or handrails.  First and last steps.  Where the edge of each step is.  Use tools that help you move around (mobility aids) if they are needed. These include:  Canes.  Walkers.  Scooters.  Crutches.  Turn on the lights when you go into a dark area. Replace any light bulbs as soon as they burn out.  Set up your furniture so you have a clear path. Avoid moving your furniture around.  If any of your floors are uneven, fix them.  If there are any pets around you, be aware of where they are.  Review your medicines with your doctor. Some medicines can make you feel dizzy. This can increase your chance of falling. Ask your doctor what other things that you can do to help prevent falls. This information is not intended to replace advice given to you by your health care provider. Make  sure you discuss any questions you have with your health care provider. Document Released: 08/05/2009 Document Revised: 03/16/2016 Document Reviewed: 11/13/2014 Elsevier Interactive Patient Education  2017 [employer].

## 2020-02-13 DIAGNOSIS — N186 End stage renal disease: Secondary | ICD-10-CM | POA: Diagnosis not present

## 2020-02-13 DIAGNOSIS — Z992 Dependence on renal dialysis: Secondary | ICD-10-CM | POA: Diagnosis not present

## 2020-02-13 DIAGNOSIS — D631 Anemia in chronic kidney disease: Secondary | ICD-10-CM | POA: Diagnosis not present

## 2020-02-13 DIAGNOSIS — N2581 Secondary hyperparathyroidism of renal origin: Secondary | ICD-10-CM | POA: Diagnosis not present

## 2020-02-13 DIAGNOSIS — D509 Iron deficiency anemia, unspecified: Secondary | ICD-10-CM | POA: Diagnosis not present

## 2020-02-16 DIAGNOSIS — N186 End stage renal disease: Secondary | ICD-10-CM | POA: Diagnosis not present

## 2020-02-16 DIAGNOSIS — N2581 Secondary hyperparathyroidism of renal origin: Secondary | ICD-10-CM | POA: Diagnosis not present

## 2020-02-16 DIAGNOSIS — D509 Iron deficiency anemia, unspecified: Secondary | ICD-10-CM | POA: Diagnosis not present

## 2020-02-16 DIAGNOSIS — D631 Anemia in chronic kidney disease: Secondary | ICD-10-CM | POA: Diagnosis not present

## 2020-02-16 DIAGNOSIS — Z992 Dependence on renal dialysis: Secondary | ICD-10-CM | POA: Diagnosis not present

## 2020-02-18 DIAGNOSIS — D631 Anemia in chronic kidney disease: Secondary | ICD-10-CM | POA: Diagnosis not present

## 2020-02-18 DIAGNOSIS — Z992 Dependence on renal dialysis: Secondary | ICD-10-CM | POA: Diagnosis not present

## 2020-02-18 DIAGNOSIS — N186 End stage renal disease: Secondary | ICD-10-CM | POA: Diagnosis not present

## 2020-02-18 DIAGNOSIS — D509 Iron deficiency anemia, unspecified: Secondary | ICD-10-CM | POA: Diagnosis not present

## 2020-02-18 DIAGNOSIS — N2581 Secondary hyperparathyroidism of renal origin: Secondary | ICD-10-CM | POA: Diagnosis not present

## 2020-02-20 DIAGNOSIS — D631 Anemia in chronic kidney disease: Secondary | ICD-10-CM | POA: Diagnosis not present

## 2020-02-20 DIAGNOSIS — Z992 Dependence on renal dialysis: Secondary | ICD-10-CM | POA: Diagnosis not present

## 2020-02-20 DIAGNOSIS — N186 End stage renal disease: Secondary | ICD-10-CM | POA: Diagnosis not present

## 2020-02-20 DIAGNOSIS — N2581 Secondary hyperparathyroidism of renal origin: Secondary | ICD-10-CM | POA: Diagnosis not present

## 2020-02-20 DIAGNOSIS — D509 Iron deficiency anemia, unspecified: Secondary | ICD-10-CM | POA: Diagnosis not present

## 2020-02-21 DIAGNOSIS — I158 Other secondary hypertension: Secondary | ICD-10-CM | POA: Diagnosis not present

## 2020-02-21 DIAGNOSIS — N186 End stage renal disease: Secondary | ICD-10-CM | POA: Diagnosis not present

## 2020-02-21 DIAGNOSIS — Z992 Dependence on renal dialysis: Secondary | ICD-10-CM | POA: Diagnosis not present

## 2020-02-23 DIAGNOSIS — Z992 Dependence on renal dialysis: Secondary | ICD-10-CM | POA: Diagnosis not present

## 2020-02-23 DIAGNOSIS — D509 Iron deficiency anemia, unspecified: Secondary | ICD-10-CM | POA: Diagnosis not present

## 2020-02-23 DIAGNOSIS — N2581 Secondary hyperparathyroidism of renal origin: Secondary | ICD-10-CM | POA: Diagnosis not present

## 2020-02-23 DIAGNOSIS — N186 End stage renal disease: Secondary | ICD-10-CM | POA: Diagnosis not present

## 2020-02-23 DIAGNOSIS — D631 Anemia in chronic kidney disease: Secondary | ICD-10-CM | POA: Diagnosis not present

## 2020-02-25 DIAGNOSIS — D631 Anemia in chronic kidney disease: Secondary | ICD-10-CM | POA: Diagnosis not present

## 2020-02-25 DIAGNOSIS — N186 End stage renal disease: Secondary | ICD-10-CM | POA: Diagnosis not present

## 2020-02-25 DIAGNOSIS — N2581 Secondary hyperparathyroidism of renal origin: Secondary | ICD-10-CM | POA: Diagnosis not present

## 2020-02-25 DIAGNOSIS — Z992 Dependence on renal dialysis: Secondary | ICD-10-CM | POA: Diagnosis not present

## 2020-02-25 DIAGNOSIS — D509 Iron deficiency anemia, unspecified: Secondary | ICD-10-CM | POA: Diagnosis not present

## 2020-02-27 DIAGNOSIS — N186 End stage renal disease: Secondary | ICD-10-CM | POA: Diagnosis not present

## 2020-02-27 DIAGNOSIS — N2581 Secondary hyperparathyroidism of renal origin: Secondary | ICD-10-CM | POA: Diagnosis not present

## 2020-02-27 DIAGNOSIS — D631 Anemia in chronic kidney disease: Secondary | ICD-10-CM | POA: Diagnosis not present

## 2020-02-27 DIAGNOSIS — D509 Iron deficiency anemia, unspecified: Secondary | ICD-10-CM | POA: Diagnosis not present

## 2020-02-27 DIAGNOSIS — Z992 Dependence on renal dialysis: Secondary | ICD-10-CM | POA: Diagnosis not present

## 2020-03-01 DIAGNOSIS — D509 Iron deficiency anemia, unspecified: Secondary | ICD-10-CM | POA: Diagnosis not present

## 2020-03-01 DIAGNOSIS — N186 End stage renal disease: Secondary | ICD-10-CM | POA: Diagnosis not present

## 2020-03-01 DIAGNOSIS — Z992 Dependence on renal dialysis: Secondary | ICD-10-CM | POA: Diagnosis not present

## 2020-03-01 DIAGNOSIS — D631 Anemia in chronic kidney disease: Secondary | ICD-10-CM | POA: Diagnosis not present

## 2020-03-01 DIAGNOSIS — N2581 Secondary hyperparathyroidism of renal origin: Secondary | ICD-10-CM | POA: Diagnosis not present

## 2020-03-03 DIAGNOSIS — N2581 Secondary hyperparathyroidism of renal origin: Secondary | ICD-10-CM | POA: Diagnosis not present

## 2020-03-03 DIAGNOSIS — D631 Anemia in chronic kidney disease: Secondary | ICD-10-CM | POA: Diagnosis not present

## 2020-03-03 DIAGNOSIS — D509 Iron deficiency anemia, unspecified: Secondary | ICD-10-CM | POA: Diagnosis not present

## 2020-03-03 DIAGNOSIS — Z992 Dependence on renal dialysis: Secondary | ICD-10-CM | POA: Diagnosis not present

## 2020-03-03 DIAGNOSIS — N186 End stage renal disease: Secondary | ICD-10-CM | POA: Diagnosis not present

## 2020-03-04 DIAGNOSIS — M064 Inflammatory polyarthropathy: Secondary | ICD-10-CM | POA: Diagnosis not present

## 2020-03-04 DIAGNOSIS — Z6831 Body mass index (BMI) 31.0-31.9, adult: Secondary | ICD-10-CM | POA: Diagnosis not present

## 2020-03-04 DIAGNOSIS — M1A09X0 Idiopathic chronic gout, multiple sites, without tophus (tophi): Secondary | ICD-10-CM | POA: Diagnosis not present

## 2020-03-04 DIAGNOSIS — M3214 Glomerular disease in systemic lupus erythematosus: Secondary | ICD-10-CM | POA: Diagnosis not present

## 2020-03-04 DIAGNOSIS — I7300 Raynaud's syndrome without gangrene: Secondary | ICD-10-CM | POA: Diagnosis not present

## 2020-03-04 DIAGNOSIS — M329 Systemic lupus erythematosus, unspecified: Secondary | ICD-10-CM | POA: Diagnosis not present

## 2020-03-04 DIAGNOSIS — R21 Rash and other nonspecific skin eruption: Secondary | ICD-10-CM | POA: Diagnosis not present

## 2020-03-04 DIAGNOSIS — I270 Primary pulmonary hypertension: Secondary | ICD-10-CM | POA: Diagnosis not present

## 2020-03-04 DIAGNOSIS — Z79899 Other long term (current) drug therapy: Secondary | ICD-10-CM | POA: Diagnosis not present

## 2020-03-04 DIAGNOSIS — E669 Obesity, unspecified: Secondary | ICD-10-CM | POA: Diagnosis not present

## 2020-03-04 DIAGNOSIS — N184 Chronic kidney disease, stage 4 (severe): Secondary | ICD-10-CM | POA: Diagnosis not present

## 2020-03-05 DIAGNOSIS — D509 Iron deficiency anemia, unspecified: Secondary | ICD-10-CM | POA: Diagnosis not present

## 2020-03-05 DIAGNOSIS — D631 Anemia in chronic kidney disease: Secondary | ICD-10-CM | POA: Diagnosis not present

## 2020-03-05 DIAGNOSIS — N186 End stage renal disease: Secondary | ICD-10-CM | POA: Diagnosis not present

## 2020-03-05 DIAGNOSIS — Z992 Dependence on renal dialysis: Secondary | ICD-10-CM | POA: Diagnosis not present

## 2020-03-05 DIAGNOSIS — N2581 Secondary hyperparathyroidism of renal origin: Secondary | ICD-10-CM | POA: Diagnosis not present

## 2020-03-08 DIAGNOSIS — N186 End stage renal disease: Secondary | ICD-10-CM | POA: Diagnosis not present

## 2020-03-08 DIAGNOSIS — D509 Iron deficiency anemia, unspecified: Secondary | ICD-10-CM | POA: Diagnosis not present

## 2020-03-08 DIAGNOSIS — N2581 Secondary hyperparathyroidism of renal origin: Secondary | ICD-10-CM | POA: Diagnosis not present

## 2020-03-08 DIAGNOSIS — Z992 Dependence on renal dialysis: Secondary | ICD-10-CM | POA: Diagnosis not present

## 2020-03-08 DIAGNOSIS — D631 Anemia in chronic kidney disease: Secondary | ICD-10-CM | POA: Diagnosis not present

## 2020-03-10 DIAGNOSIS — D631 Anemia in chronic kidney disease: Secondary | ICD-10-CM | POA: Diagnosis not present

## 2020-03-10 DIAGNOSIS — N2581 Secondary hyperparathyroidism of renal origin: Secondary | ICD-10-CM | POA: Diagnosis not present

## 2020-03-10 DIAGNOSIS — Z992 Dependence on renal dialysis: Secondary | ICD-10-CM | POA: Diagnosis not present

## 2020-03-10 DIAGNOSIS — D509 Iron deficiency anemia, unspecified: Secondary | ICD-10-CM | POA: Diagnosis not present

## 2020-03-10 DIAGNOSIS — N186 End stage renal disease: Secondary | ICD-10-CM | POA: Diagnosis not present

## 2020-03-12 DIAGNOSIS — N2581 Secondary hyperparathyroidism of renal origin: Secondary | ICD-10-CM | POA: Diagnosis not present

## 2020-03-12 DIAGNOSIS — N186 End stage renal disease: Secondary | ICD-10-CM | POA: Diagnosis not present

## 2020-03-12 DIAGNOSIS — D631 Anemia in chronic kidney disease: Secondary | ICD-10-CM | POA: Diagnosis not present

## 2020-03-12 DIAGNOSIS — Z992 Dependence on renal dialysis: Secondary | ICD-10-CM | POA: Diagnosis not present

## 2020-03-12 DIAGNOSIS — D509 Iron deficiency anemia, unspecified: Secondary | ICD-10-CM | POA: Diagnosis not present

## 2020-03-15 DIAGNOSIS — Z992 Dependence on renal dialysis: Secondary | ICD-10-CM | POA: Diagnosis not present

## 2020-03-15 DIAGNOSIS — N186 End stage renal disease: Secondary | ICD-10-CM | POA: Diagnosis not present

## 2020-03-15 DIAGNOSIS — D631 Anemia in chronic kidney disease: Secondary | ICD-10-CM | POA: Diagnosis not present

## 2020-03-15 DIAGNOSIS — N2581 Secondary hyperparathyroidism of renal origin: Secondary | ICD-10-CM | POA: Diagnosis not present

## 2020-03-15 DIAGNOSIS — D509 Iron deficiency anemia, unspecified: Secondary | ICD-10-CM | POA: Diagnosis not present

## 2020-03-17 DIAGNOSIS — D509 Iron deficiency anemia, unspecified: Secondary | ICD-10-CM | POA: Diagnosis not present

## 2020-03-17 DIAGNOSIS — N2581 Secondary hyperparathyroidism of renal origin: Secondary | ICD-10-CM | POA: Diagnosis not present

## 2020-03-17 DIAGNOSIS — N186 End stage renal disease: Secondary | ICD-10-CM | POA: Diagnosis not present

## 2020-03-17 DIAGNOSIS — D631 Anemia in chronic kidney disease: Secondary | ICD-10-CM | POA: Diagnosis not present

## 2020-03-17 DIAGNOSIS — Z992 Dependence on renal dialysis: Secondary | ICD-10-CM | POA: Diagnosis not present

## 2020-03-18 DIAGNOSIS — Z23 Encounter for immunization: Secondary | ICD-10-CM | POA: Diagnosis not present

## 2020-03-19 DIAGNOSIS — D631 Anemia in chronic kidney disease: Secondary | ICD-10-CM | POA: Diagnosis not present

## 2020-03-19 DIAGNOSIS — N186 End stage renal disease: Secondary | ICD-10-CM | POA: Diagnosis not present

## 2020-03-19 DIAGNOSIS — Z992 Dependence on renal dialysis: Secondary | ICD-10-CM | POA: Diagnosis not present

## 2020-03-19 DIAGNOSIS — D509 Iron deficiency anemia, unspecified: Secondary | ICD-10-CM | POA: Diagnosis not present

## 2020-03-19 DIAGNOSIS — N2581 Secondary hyperparathyroidism of renal origin: Secondary | ICD-10-CM | POA: Diagnosis not present

## 2020-03-22 DIAGNOSIS — N2581 Secondary hyperparathyroidism of renal origin: Secondary | ICD-10-CM | POA: Diagnosis not present

## 2020-03-22 DIAGNOSIS — Z992 Dependence on renal dialysis: Secondary | ICD-10-CM | POA: Diagnosis not present

## 2020-03-22 DIAGNOSIS — N186 End stage renal disease: Secondary | ICD-10-CM | POA: Diagnosis not present

## 2020-03-22 DIAGNOSIS — D509 Iron deficiency anemia, unspecified: Secondary | ICD-10-CM | POA: Diagnosis not present

## 2020-03-22 DIAGNOSIS — D631 Anemia in chronic kidney disease: Secondary | ICD-10-CM | POA: Diagnosis not present

## 2020-04-01 DIAGNOSIS — Z01812 Encounter for preprocedural laboratory examination: Secondary | ICD-10-CM | POA: Diagnosis not present

## 2020-04-01 DIAGNOSIS — N186 End stage renal disease: Secondary | ICD-10-CM | POA: Diagnosis not present

## 2020-04-01 DIAGNOSIS — Z20822 Contact with and (suspected) exposure to covid-19: Secondary | ICD-10-CM | POA: Diagnosis not present

## 2020-04-15 ENCOUNTER — Other Ambulatory Visit: Payer: Self-pay | Admitting: Gastroenterology

## 2020-04-22 DIAGNOSIS — Z992 Dependence on renal dialysis: Secondary | ICD-10-CM | POA: Diagnosis not present

## 2020-04-22 DIAGNOSIS — N186 End stage renal disease: Secondary | ICD-10-CM | POA: Diagnosis not present

## 2020-04-22 DIAGNOSIS — I158 Other secondary hypertension: Secondary | ICD-10-CM | POA: Diagnosis not present

## 2020-06-23 DIAGNOSIS — D631 Anemia in chronic kidney disease: Secondary | ICD-10-CM | POA: Diagnosis not present

## 2020-06-23 DIAGNOSIS — N186 End stage renal disease: Secondary | ICD-10-CM | POA: Diagnosis not present

## 2020-06-23 DIAGNOSIS — N2581 Secondary hyperparathyroidism of renal origin: Secondary | ICD-10-CM | POA: Diagnosis not present

## 2020-06-23 DIAGNOSIS — D689 Coagulation defect, unspecified: Secondary | ICD-10-CM | POA: Diagnosis not present

## 2020-06-23 DIAGNOSIS — Z992 Dependence on renal dialysis: Secondary | ICD-10-CM | POA: Diagnosis not present

## 2020-06-23 DIAGNOSIS — D509 Iron deficiency anemia, unspecified: Secondary | ICD-10-CM | POA: Diagnosis not present

## 2020-06-25 DIAGNOSIS — N2581 Secondary hyperparathyroidism of renal origin: Secondary | ICD-10-CM | POA: Diagnosis not present

## 2020-06-25 DIAGNOSIS — Z992 Dependence on renal dialysis: Secondary | ICD-10-CM | POA: Diagnosis not present

## 2020-06-25 DIAGNOSIS — D631 Anemia in chronic kidney disease: Secondary | ICD-10-CM | POA: Diagnosis not present

## 2020-06-25 DIAGNOSIS — D509 Iron deficiency anemia, unspecified: Secondary | ICD-10-CM | POA: Diagnosis not present

## 2020-06-25 DIAGNOSIS — D689 Coagulation defect, unspecified: Secondary | ICD-10-CM | POA: Diagnosis not present

## 2020-06-25 DIAGNOSIS — N186 End stage renal disease: Secondary | ICD-10-CM | POA: Diagnosis not present

## 2020-06-26 ENCOUNTER — Other Ambulatory Visit: Payer: Self-pay | Admitting: Gastroenterology

## 2020-06-28 DIAGNOSIS — N186 End stage renal disease: Secondary | ICD-10-CM | POA: Diagnosis not present

## 2020-06-28 DIAGNOSIS — Z992 Dependence on renal dialysis: Secondary | ICD-10-CM | POA: Diagnosis not present

## 2020-06-28 DIAGNOSIS — N2581 Secondary hyperparathyroidism of renal origin: Secondary | ICD-10-CM | POA: Diagnosis not present

## 2020-06-28 DIAGNOSIS — D509 Iron deficiency anemia, unspecified: Secondary | ICD-10-CM | POA: Diagnosis not present

## 2020-06-28 DIAGNOSIS — D631 Anemia in chronic kidney disease: Secondary | ICD-10-CM | POA: Diagnosis not present

## 2020-06-28 DIAGNOSIS — D689 Coagulation defect, unspecified: Secondary | ICD-10-CM | POA: Diagnosis not present

## 2020-06-29 DIAGNOSIS — I270 Primary pulmonary hypertension: Secondary | ICD-10-CM | POA: Diagnosis not present

## 2020-06-29 DIAGNOSIS — I272 Pulmonary hypertension, unspecified: Secondary | ICD-10-CM | POA: Diagnosis not present

## 2020-06-29 DIAGNOSIS — M3219 Other organ or system involvement in systemic lupus erythematosus: Secondary | ICD-10-CM | POA: Diagnosis not present

## 2020-06-29 DIAGNOSIS — N19 Unspecified kidney failure: Secondary | ICD-10-CM | POA: Diagnosis not present

## 2020-06-30 DIAGNOSIS — N2581 Secondary hyperparathyroidism of renal origin: Secondary | ICD-10-CM | POA: Diagnosis not present

## 2020-06-30 DIAGNOSIS — D631 Anemia in chronic kidney disease: Secondary | ICD-10-CM | POA: Diagnosis not present

## 2020-06-30 DIAGNOSIS — D689 Coagulation defect, unspecified: Secondary | ICD-10-CM | POA: Diagnosis not present

## 2020-06-30 DIAGNOSIS — D509 Iron deficiency anemia, unspecified: Secondary | ICD-10-CM | POA: Diagnosis not present

## 2020-06-30 DIAGNOSIS — N186 End stage renal disease: Secondary | ICD-10-CM | POA: Diagnosis not present

## 2020-06-30 DIAGNOSIS — Z992 Dependence on renal dialysis: Secondary | ICD-10-CM | POA: Diagnosis not present

## 2020-07-02 DIAGNOSIS — Z992 Dependence on renal dialysis: Secondary | ICD-10-CM | POA: Diagnosis not present

## 2020-07-02 DIAGNOSIS — N186 End stage renal disease: Secondary | ICD-10-CM | POA: Diagnosis not present

## 2020-07-02 DIAGNOSIS — D689 Coagulation defect, unspecified: Secondary | ICD-10-CM | POA: Diagnosis not present

## 2020-07-02 DIAGNOSIS — D509 Iron deficiency anemia, unspecified: Secondary | ICD-10-CM | POA: Diagnosis not present

## 2020-07-02 DIAGNOSIS — N2581 Secondary hyperparathyroidism of renal origin: Secondary | ICD-10-CM | POA: Diagnosis not present

## 2020-07-02 DIAGNOSIS — D631 Anemia in chronic kidney disease: Secondary | ICD-10-CM | POA: Diagnosis not present

## 2020-07-05 DIAGNOSIS — D689 Coagulation defect, unspecified: Secondary | ICD-10-CM | POA: Diagnosis not present

## 2020-07-05 DIAGNOSIS — N2581 Secondary hyperparathyroidism of renal origin: Secondary | ICD-10-CM | POA: Diagnosis not present

## 2020-07-05 DIAGNOSIS — N186 End stage renal disease: Secondary | ICD-10-CM | POA: Diagnosis not present

## 2020-07-05 DIAGNOSIS — D509 Iron deficiency anemia, unspecified: Secondary | ICD-10-CM | POA: Diagnosis not present

## 2020-07-05 DIAGNOSIS — Z992 Dependence on renal dialysis: Secondary | ICD-10-CM | POA: Diagnosis not present

## 2020-07-05 DIAGNOSIS — D631 Anemia in chronic kidney disease: Secondary | ICD-10-CM | POA: Diagnosis not present

## 2020-07-07 DIAGNOSIS — Z992 Dependence on renal dialysis: Secondary | ICD-10-CM | POA: Diagnosis not present

## 2020-07-07 DIAGNOSIS — D509 Iron deficiency anemia, unspecified: Secondary | ICD-10-CM | POA: Diagnosis not present

## 2020-07-07 DIAGNOSIS — D631 Anemia in chronic kidney disease: Secondary | ICD-10-CM | POA: Diagnosis not present

## 2020-07-07 DIAGNOSIS — N186 End stage renal disease: Secondary | ICD-10-CM | POA: Diagnosis not present

## 2020-07-07 DIAGNOSIS — N2581 Secondary hyperparathyroidism of renal origin: Secondary | ICD-10-CM | POA: Diagnosis not present

## 2020-07-07 DIAGNOSIS — D689 Coagulation defect, unspecified: Secondary | ICD-10-CM | POA: Diagnosis not present

## 2020-07-09 DIAGNOSIS — N2581 Secondary hyperparathyroidism of renal origin: Secondary | ICD-10-CM | POA: Diagnosis not present

## 2020-07-09 DIAGNOSIS — D689 Coagulation defect, unspecified: Secondary | ICD-10-CM | POA: Diagnosis not present

## 2020-07-09 DIAGNOSIS — D509 Iron deficiency anemia, unspecified: Secondary | ICD-10-CM | POA: Diagnosis not present

## 2020-07-09 DIAGNOSIS — D631 Anemia in chronic kidney disease: Secondary | ICD-10-CM | POA: Diagnosis not present

## 2020-07-09 DIAGNOSIS — N186 End stage renal disease: Secondary | ICD-10-CM | POA: Diagnosis not present

## 2020-07-09 DIAGNOSIS — Z992 Dependence on renal dialysis: Secondary | ICD-10-CM | POA: Diagnosis not present

## 2020-07-12 DIAGNOSIS — Z992 Dependence on renal dialysis: Secondary | ICD-10-CM | POA: Diagnosis not present

## 2020-07-12 DIAGNOSIS — N186 End stage renal disease: Secondary | ICD-10-CM | POA: Diagnosis not present

## 2020-07-12 DIAGNOSIS — N2581 Secondary hyperparathyroidism of renal origin: Secondary | ICD-10-CM | POA: Diagnosis not present

## 2020-07-12 DIAGNOSIS — D689 Coagulation defect, unspecified: Secondary | ICD-10-CM | POA: Diagnosis not present

## 2020-07-12 DIAGNOSIS — D631 Anemia in chronic kidney disease: Secondary | ICD-10-CM | POA: Diagnosis not present

## 2020-07-12 DIAGNOSIS — D509 Iron deficiency anemia, unspecified: Secondary | ICD-10-CM | POA: Diagnosis not present

## 2020-07-14 DIAGNOSIS — N2581 Secondary hyperparathyroidism of renal origin: Secondary | ICD-10-CM | POA: Diagnosis not present

## 2020-07-14 DIAGNOSIS — N186 End stage renal disease: Secondary | ICD-10-CM | POA: Diagnosis not present

## 2020-07-14 DIAGNOSIS — D689 Coagulation defect, unspecified: Secondary | ICD-10-CM | POA: Diagnosis not present

## 2020-07-14 DIAGNOSIS — D631 Anemia in chronic kidney disease: Secondary | ICD-10-CM | POA: Diagnosis not present

## 2020-07-14 DIAGNOSIS — D509 Iron deficiency anemia, unspecified: Secondary | ICD-10-CM | POA: Diagnosis not present

## 2020-07-14 DIAGNOSIS — Z992 Dependence on renal dialysis: Secondary | ICD-10-CM | POA: Diagnosis not present

## 2020-07-16 DIAGNOSIS — Z992 Dependence on renal dialysis: Secondary | ICD-10-CM | POA: Diagnosis not present

## 2020-07-16 DIAGNOSIS — D631 Anemia in chronic kidney disease: Secondary | ICD-10-CM | POA: Diagnosis not present

## 2020-07-16 DIAGNOSIS — N2581 Secondary hyperparathyroidism of renal origin: Secondary | ICD-10-CM | POA: Diagnosis not present

## 2020-07-16 DIAGNOSIS — D689 Coagulation defect, unspecified: Secondary | ICD-10-CM | POA: Diagnosis not present

## 2020-07-16 DIAGNOSIS — D509 Iron deficiency anemia, unspecified: Secondary | ICD-10-CM | POA: Diagnosis not present

## 2020-07-16 DIAGNOSIS — N186 End stage renal disease: Secondary | ICD-10-CM | POA: Diagnosis not present

## 2020-07-19 DIAGNOSIS — D689 Coagulation defect, unspecified: Secondary | ICD-10-CM | POA: Diagnosis not present

## 2020-07-19 DIAGNOSIS — Z992 Dependence on renal dialysis: Secondary | ICD-10-CM | POA: Diagnosis not present

## 2020-07-19 DIAGNOSIS — N2581 Secondary hyperparathyroidism of renal origin: Secondary | ICD-10-CM | POA: Diagnosis not present

## 2020-07-19 DIAGNOSIS — N186 End stage renal disease: Secondary | ICD-10-CM | POA: Diagnosis not present

## 2020-07-19 DIAGNOSIS — D631 Anemia in chronic kidney disease: Secondary | ICD-10-CM | POA: Diagnosis not present

## 2020-07-19 DIAGNOSIS — M3219 Other organ or system involvement in systemic lupus erythematosus: Secondary | ICD-10-CM | POA: Diagnosis not present

## 2020-07-19 DIAGNOSIS — D509 Iron deficiency anemia, unspecified: Secondary | ICD-10-CM | POA: Diagnosis not present

## 2020-07-19 DIAGNOSIS — I272 Pulmonary hypertension, unspecified: Secondary | ICD-10-CM | POA: Diagnosis not present

## 2020-07-19 DIAGNOSIS — I509 Heart failure, unspecified: Secondary | ICD-10-CM | POA: Diagnosis not present

## 2020-07-21 DIAGNOSIS — Z79899 Other long term (current) drug therapy: Secondary | ICD-10-CM | POA: Diagnosis not present

## 2020-07-21 DIAGNOSIS — D689 Coagulation defect, unspecified: Secondary | ICD-10-CM | POA: Diagnosis not present

## 2020-07-21 DIAGNOSIS — D631 Anemia in chronic kidney disease: Secondary | ICD-10-CM | POA: Diagnosis not present

## 2020-07-21 DIAGNOSIS — L930 Discoid lupus erythematosus: Secondary | ICD-10-CM | POA: Diagnosis not present

## 2020-07-21 DIAGNOSIS — L089 Local infection of the skin and subcutaneous tissue, unspecified: Secondary | ICD-10-CM | POA: Diagnosis not present

## 2020-07-21 DIAGNOSIS — Z5181 Encounter for therapeutic drug level monitoring: Secondary | ICD-10-CM | POA: Diagnosis not present

## 2020-07-21 DIAGNOSIS — N186 End stage renal disease: Secondary | ICD-10-CM | POA: Diagnosis not present

## 2020-07-21 DIAGNOSIS — Z992 Dependence on renal dialysis: Secondary | ICD-10-CM | POA: Diagnosis not present

## 2020-07-21 DIAGNOSIS — D509 Iron deficiency anemia, unspecified: Secondary | ICD-10-CM | POA: Diagnosis not present

## 2020-07-21 DIAGNOSIS — N2581 Secondary hyperparathyroidism of renal origin: Secondary | ICD-10-CM | POA: Diagnosis not present

## 2020-07-22 DIAGNOSIS — I158 Other secondary hypertension: Secondary | ICD-10-CM | POA: Diagnosis not present

## 2020-07-22 DIAGNOSIS — N186 End stage renal disease: Secondary | ICD-10-CM | POA: Diagnosis not present

## 2020-07-22 DIAGNOSIS — Z992 Dependence on renal dialysis: Secondary | ICD-10-CM | POA: Diagnosis not present

## 2020-07-23 DIAGNOSIS — D631 Anemia in chronic kidney disease: Secondary | ICD-10-CM | POA: Diagnosis not present

## 2020-07-23 DIAGNOSIS — D689 Coagulation defect, unspecified: Secondary | ICD-10-CM | POA: Diagnosis not present

## 2020-07-23 DIAGNOSIS — Z992 Dependence on renal dialysis: Secondary | ICD-10-CM | POA: Diagnosis not present

## 2020-07-23 DIAGNOSIS — D509 Iron deficiency anemia, unspecified: Secondary | ICD-10-CM | POA: Diagnosis not present

## 2020-07-23 DIAGNOSIS — N2581 Secondary hyperparathyroidism of renal origin: Secondary | ICD-10-CM | POA: Diagnosis not present

## 2020-07-23 DIAGNOSIS — N186 End stage renal disease: Secondary | ICD-10-CM | POA: Diagnosis not present

## 2020-07-26 DIAGNOSIS — N2581 Secondary hyperparathyroidism of renal origin: Secondary | ICD-10-CM | POA: Diagnosis not present

## 2020-07-26 DIAGNOSIS — N186 End stage renal disease: Secondary | ICD-10-CM | POA: Diagnosis not present

## 2020-07-26 DIAGNOSIS — D509 Iron deficiency anemia, unspecified: Secondary | ICD-10-CM | POA: Diagnosis not present

## 2020-07-26 DIAGNOSIS — D689 Coagulation defect, unspecified: Secondary | ICD-10-CM | POA: Diagnosis not present

## 2020-07-26 DIAGNOSIS — D631 Anemia in chronic kidney disease: Secondary | ICD-10-CM | POA: Diagnosis not present

## 2020-07-26 DIAGNOSIS — Z992 Dependence on renal dialysis: Secondary | ICD-10-CM | POA: Diagnosis not present

## 2020-07-26 NOTE — Progress Notes (Signed)
Phone [phone] In person visit   Subjective:   [name] [name] is a 39 y.o. year old very pleasant female patient who presents for/with See problem oriented charting Chief Complaint  Patient presents with  . Back Pain   This visit occurred during the SARS-CoV-2 public health emergency.  Safety protocols were in place, including screening questions prior to the visit, additional usage of staff PPE, and extensive cleaning of exam room while observing appropriate contact time as indicated for disinfecting solutions.   Past Medical History-  Patient Active Problem List   Diagnosis Date Noted  . ESRD on dialysis [facility]) 08/05/2015    Priority: High  . Essential hypertension 09/22/2010    Priority: High  . Secondary cardiomyopathy ([place]) 07/14/2009    Priority: High  . LUPUS NEPHRITIS 07/14/2009    Priority: High  . Anemia 08/07/2006    Priority: High  . ERYTHEMATOSUS, LUPUS 08/07/2006    Priority: High  . History of recurrent cellulitis of lower leg 03/09/2016    Priority: Medium  . Gout 08/14/2012    Priority: Medium  . Pulmonary hypertension ([place]) 07/28/2010    Priority: Medium  . History of TIA (transient ischemic attack) 07/14/2009    Priority: Medium  . Onychomycosis 12/02/2014    Priority: Low  . Immunosuppressed status ([place]) 07/27/2020  . Abdominal pain 04/09/2019  . LVH (left ventricular hypertrophy) 12/20/2018  . Palpitations 12/20/2018  . Nonrheumatic mitral valve regurgitation 12/20/2018  . Gastroesophageal reflux disease   . Acute blood loss anemia 07/25/2018  . Heme positive stool 07/25/2018  . Acquired hallux valgus of right foot 06/15/2017  . 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 Plantar fasciitis of right foot 06/15/2017  . Atypical chest pain 11/27/2016  . CKD (chronic kidney disease) stage V requiring chronic peritoneal dialysis  03/17/2016  . Intractable nausea and vomiting 02/20/2016  . Enteritis due to Clostridium difficile 05/03/2015  . DOE  (dyspnea on exertion) 09/29/2014    Medications- reviewed and updated Current Outpatient Medications  Medication Sig Dispense Refill  . b complex-vitamin c-folic acid (NEPHRO-VITE) 0.8 MG TABS tablet Take 1 tablet by mouth at bedtime.     . cinacalcet (SENSIPAR) 30 MG tablet Take 30 mg by mouth every evening.     Marland Kitchen desonide (DESOWEN) 0.05 % ointment Apply 1 application topically 2 (two) times daily as needed (skin irritation (face)).    Marland Kitchen dicyclomine (BENTYL) 10 MG capsule Take 1 capsule (10 mg total) by mouth 3 (three) times daily as needed for spasms. 90 capsule 5  . ferric citrate (AURYXIA) 1 GM 210 MG(Fe) tablet Take 630 mg by mouth 3 (three) times daily with meals.     . hydroxychloroquine (PLAQUENIL) 200 MG tablet TAKE 1 TABLET BY MOUTH TWICE A DAY (Patient taking differently: Take 200 mg by mouth 2 (two) times daily. ) 60 tablet 1  . lidocaine-prilocaine (EMLA) cream Apply 1 application topically daily as needed. (Patient taking differently: Apply 1 application topically daily as needed (prior to port accessed.). ) 30 g 0  . macitentan (OPSUMIT) 10 MG tablet Take 10 mg by mouth daily.    . midodrine (PROAMATINE) 10 MG tablet Take 10 mg by mouth every Monday, Wednesday, and Friday with hemodialysis.    Marland Kitchen ondansetron (ZOFRAN-ODT) 4 MG disintegrating tablet Take 1 tablet (4 mg total) by mouth every 8 (eight) hours as needed for nausea or vomiting. 30 tablet 2  . pantoprazole (PROTONIX) 40 MG tablet TAKE 1 TABLET BY MOUTH EVERY DAY 30 tablet 0  .  predniSONE (DELTASONE) 5 MG tablet Take 5 mg by mouth every evening.    . triamcinolone ointment (KENALOG) 0.1 % Apply 1 application topically daily as needed (FOR RASH/SKIN IRRITATION.).     No current facility-administered medications for this visit.     Objective:  BP 122/70   Pulse (!) 58   Temp 98.2 F (36.8 C) (Temporal)   Resp 18   Ht 5\' 7"  (1.702 m)   Wt 209 lb 12.8 oz (95.2 kg)   BMI 32.86 kg/m  Gen: NAD, resting comfortably CV:  RRR no murmurs rubs or gallops Lungs: CTAB no crackles, wheeze, rhonchi  Back - Normal skin, Spine with normal alignment and no deformity.  No tenderness to vertebral process palpation.  Paraspinous muscles are  tender on the left and with spasm-right side largely normal.   Range of motion is full at neck and lumbar sacral regions. Negative Straight leg raise.  Does appear uncomfortable when trying to get onto table Neuro- no saddle anesthesia, 5/5 strength lower extremities    Assessment and Plan   #Pulmonary hypertension/immunosuppressed status/lupus nephritis - Was on oxygen and now off for pulmonary hypertension.  Unfortunately this disqualified her from the transplant list for her kidneys-this is been very tough for her, not within the last month or so.  Her immunosuppressed status has been a burden on her with COVID-19-she is starting to get out a little bit more now and feels some more freedom.  Primarily still staying at home though and doing dialysis.  Continue close follow-up with rheumatology for immunosuppressed status as well as cardiology for pulmonary hypertension.  For her lupus nephritis-continue follow-up with nephrology and dialysis-renally adjusted Flexeril as below  # Back Pain- left low back- does not radiate to leg S: Pain started last Monday (07-19-2020). She stated normally when she has back pain that it goes away but this time it hasn't. She has only taken otc Tylenol. Denies falls or trauma recently. Feels somewhat weak due to pain- may have to use arms to stand up for instance.   Pain rating, quality, Location-left low back sharp intermittent pain in certain positions such as twisting bending or with walking. Pain at worst up to 6-7/10. Walking may be 5/10 and then if hits certain position it worsens. Can move and find another rposition and tings improve.  -fine with laying and sitting.  - tylenol minimal relief - heat without significant help Previous  imaging-none  ROS-No saddle anesthesia, bladder incontinence, fecal incontinence, weakness in extremity, numbness or tingling in extremity. History negative for trauma, history of cancer, fever, chills, unintentional weight loss, recent bacterial infection, recent IV drug use, HIV, pain worse at night or while supine.  A/P: 39 year old female with lupus with back pain longer than, and duration for her not responding to Tylenol or heat.  Suspected musculoskeletal source of pain-after avoid NSAIDs with end-stage renal disease.  We will trial a course of prednisone-40 mg for 5 days above her baseline 5 mg.  Also very low-dose of muscle relaxant given tension in low back-she will have her sister with her the first time she takes this to make sure she tolerates that.  Prednisone may also help if this is a lupus flare as she is not having any other systemic symptoms.  Asked her to update her rheumatologist as below (she is already on hydroxychloroquine) -follow-up with her if not improving-also consider sports medicine.  From AVS "  I strongly suspect your pain is musculoskeletal in nature but  could be related to your lupus  Normally I would use a medicine like ibuprofen/Aleve/Motrin but with your kidney function we cannot do that-lets try a short burst of prednisone to see if that helps.  Also can try very low-dose muscle relaxant half tablet of Flexeril up to twice a day-do not drive for 8 hours after using.  For the first time you take this please have your sister around to see how you tolerate it and discontinue it if you get overly groggy.  Please let me know if you are not making significant improvement within 1 week-I am hoping for at least 50% improvement by that time-if not improving I would want to get you in with rheumatology for their opinion. If you dont mind go ahead and update Dr. Trudie Reed. "  Recommended follow up: Close follow-up if not improving-likely start with rheumatology  Lab/Order  associations:   ICD-10-CM   1. Acute left-sided low back pain without sciatica  M54.50   2. Immunosuppressed status ([place])  D84.9   3. Glomerular disease in systemic lupus erythematosus (HCC) Chronic M32.14   4. Pulmonary hypertension (HCC) Chronic I27.20     Meds ordered this encounter  Medications  . predniSONE (DELTASONE) 20 MG tablet    Sig: Take 2 tablets (40 mg total) by mouth daily with breakfast.    Dispense:  10 tablet    Refill:  0  . cyclobenzaprine (FLEXERIL) 5 MG tablet    Sig: Take 0.5 tablets (2.5 mg total) by mouth 2 (two) times daily as needed for muscle spasms (no driving for 8 hours after taking).    Dispense:  20 tablet    Refill:  0    Time Spent: 30 minutes of total time (3:49 PM-4:14 PM, 8:56 PM-9:01 PM) was spent on the date of the encounter performing the following actions: chart review prior to seeing the patient, obtaining history, performing a medically necessary exam, counseling on the treatment plan, placing orders, and documenting in our EHR.   Return precautions advised.  Garret Reddish, MD

## 2020-07-26 NOTE — Patient Instructions (Addendum)
Health Maintenance Due  Topic Date Due  . COVID-19 Vaccine (1) Will call back with the dates. Never done  . INFLUENZA VACCINE Declined in office flu shot. Please let me know the date you receive this 05/23/2020   I strongly suspect your pain is musculoskeletal in nature but could be related to your lupus  Normally I would use a medicine like ibuprofen/Aleve/Motrin but with your kidney function we cannot do that-lets try a short burst of prednisone to see if that helps.  Also can try very low-dose muscle relaxant half tablet of Flexeril up to twice a day-do not drive for 8 hours after using.  For the first time you take this please have your sister around to see how you tolerate it and discontinue it if you get overly groggy.  Please let me know if you are not making significant improvement within 1 week-I am hoping for at least 50% improvement by that time-if not improving I would want to get you in with rheumatology for their opinion. If you dont mind go ahead and update Dr. Trudie Reed.

## 2020-07-27 ENCOUNTER — Ambulatory Visit (INDEPENDENT_AMBULATORY_CARE_PROVIDER_SITE_OTHER): Payer: Medicare Other | Admitting: Family Medicine

## 2020-07-27 ENCOUNTER — Encounter: Payer: Self-pay | Admitting: Family Medicine

## 2020-07-27 ENCOUNTER — Other Ambulatory Visit: Payer: Self-pay

## 2020-07-27 VITALS — BP 122/70 | HR 58 | Temp 98.2°F | Resp 18 | Ht 67.0 in | Wt 209.8 lb

## 2020-07-27 DIAGNOSIS — M545 Low back pain, unspecified: Secondary | ICD-10-CM | POA: Diagnosis not present

## 2020-07-27 DIAGNOSIS — D849 Immunodeficiency, unspecified: Secondary | ICD-10-CM | POA: Diagnosis not present

## 2020-07-27 DIAGNOSIS — M3214 Glomerular disease in systemic lupus erythematosus: Secondary | ICD-10-CM

## 2020-07-27 DIAGNOSIS — I272 Pulmonary hypertension, unspecified: Secondary | ICD-10-CM | POA: Diagnosis not present

## 2020-07-27 MED ORDER — CYCLOBENZAPRINE HCL 5 MG PO TABS: 2.5000 mg | ORAL_TABLET | Freq: Two times a day (BID) | ORAL | 0 refills | Status: DC | PRN

## 2020-07-27 MED ORDER — PREDNISONE 20 MG PO TABS: 40.0000 mg | ORAL_TABLET | Freq: Every day | ORAL | 0 refills | Status: DC

## 2020-07-28 ENCOUNTER — Other Ambulatory Visit: Payer: Self-pay | Admitting: Gastroenterology

## 2020-07-28 DIAGNOSIS — N2581 Secondary hyperparathyroidism of renal origin: Secondary | ICD-10-CM | POA: Diagnosis not present

## 2020-07-28 DIAGNOSIS — D631 Anemia in chronic kidney disease: Secondary | ICD-10-CM | POA: Diagnosis not present

## 2020-07-28 DIAGNOSIS — N186 End stage renal disease: Secondary | ICD-10-CM | POA: Diagnosis not present

## 2020-07-28 DIAGNOSIS — D689 Coagulation defect, unspecified: Secondary | ICD-10-CM | POA: Diagnosis not present

## 2020-07-28 DIAGNOSIS — D509 Iron deficiency anemia, unspecified: Secondary | ICD-10-CM | POA: Diagnosis not present

## 2020-07-28 DIAGNOSIS — Z992 Dependence on renal dialysis: Secondary | ICD-10-CM | POA: Diagnosis not present

## 2020-07-30 ENCOUNTER — Encounter: Payer: Self-pay | Admitting: Family Medicine

## 2020-07-30 DIAGNOSIS — D509 Iron deficiency anemia, unspecified: Secondary | ICD-10-CM | POA: Diagnosis not present

## 2020-07-30 DIAGNOSIS — D689 Coagulation defect, unspecified: Secondary | ICD-10-CM | POA: Diagnosis not present

## 2020-07-30 DIAGNOSIS — D631 Anemia in chronic kidney disease: Secondary | ICD-10-CM | POA: Diagnosis not present

## 2020-07-30 DIAGNOSIS — N186 End stage renal disease: Secondary | ICD-10-CM | POA: Diagnosis not present

## 2020-07-30 DIAGNOSIS — Z992 Dependence on renal dialysis: Secondary | ICD-10-CM | POA: Diagnosis not present

## 2020-07-30 DIAGNOSIS — N2581 Secondary hyperparathyroidism of renal origin: Secondary | ICD-10-CM | POA: Diagnosis not present

## 2020-08-01 DIAGNOSIS — D689 Coagulation defect, unspecified: Secondary | ICD-10-CM | POA: Diagnosis not present

## 2020-08-01 DIAGNOSIS — N2581 Secondary hyperparathyroidism of renal origin: Secondary | ICD-10-CM | POA: Diagnosis not present

## 2020-08-01 DIAGNOSIS — N186 End stage renal disease: Secondary | ICD-10-CM | POA: Diagnosis not present

## 2020-08-01 DIAGNOSIS — D631 Anemia in chronic kidney disease: Secondary | ICD-10-CM | POA: Diagnosis not present

## 2020-08-01 DIAGNOSIS — Z992 Dependence on renal dialysis: Secondary | ICD-10-CM | POA: Diagnosis not present

## 2020-08-01 DIAGNOSIS — D509 Iron deficiency anemia, unspecified: Secondary | ICD-10-CM | POA: Diagnosis not present

## 2020-08-02 DIAGNOSIS — Z992 Dependence on renal dialysis: Secondary | ICD-10-CM | POA: Diagnosis not present

## 2020-08-02 DIAGNOSIS — N2581 Secondary hyperparathyroidism of renal origin: Secondary | ICD-10-CM | POA: Diagnosis not present

## 2020-08-02 DIAGNOSIS — D689 Coagulation defect, unspecified: Secondary | ICD-10-CM | POA: Diagnosis not present

## 2020-08-02 DIAGNOSIS — D509 Iron deficiency anemia, unspecified: Secondary | ICD-10-CM | POA: Diagnosis not present

## 2020-08-02 DIAGNOSIS — N186 End stage renal disease: Secondary | ICD-10-CM | POA: Diagnosis not present

## 2020-08-02 DIAGNOSIS — D631 Anemia in chronic kidney disease: Secondary | ICD-10-CM | POA: Diagnosis not present

## 2020-08-04 DIAGNOSIS — D509 Iron deficiency anemia, unspecified: Secondary | ICD-10-CM | POA: Diagnosis not present

## 2020-08-04 DIAGNOSIS — Z992 Dependence on renal dialysis: Secondary | ICD-10-CM | POA: Diagnosis not present

## 2020-08-04 DIAGNOSIS — D631 Anemia in chronic kidney disease: Secondary | ICD-10-CM | POA: Diagnosis not present

## 2020-08-04 DIAGNOSIS — N186 End stage renal disease: Secondary | ICD-10-CM | POA: Diagnosis not present

## 2020-08-04 DIAGNOSIS — D689 Coagulation defect, unspecified: Secondary | ICD-10-CM | POA: Diagnosis not present

## 2020-08-04 DIAGNOSIS — N2581 Secondary hyperparathyroidism of renal origin: Secondary | ICD-10-CM | POA: Diagnosis not present

## 2020-08-06 DIAGNOSIS — Z992 Dependence on renal dialysis: Secondary | ICD-10-CM | POA: Diagnosis not present

## 2020-08-06 DIAGNOSIS — D631 Anemia in chronic kidney disease: Secondary | ICD-10-CM | POA: Diagnosis not present

## 2020-08-06 DIAGNOSIS — D509 Iron deficiency anemia, unspecified: Secondary | ICD-10-CM | POA: Diagnosis not present

## 2020-08-06 DIAGNOSIS — D689 Coagulation defect, unspecified: Secondary | ICD-10-CM | POA: Diagnosis not present

## 2020-08-06 DIAGNOSIS — N186 End stage renal disease: Secondary | ICD-10-CM | POA: Diagnosis not present

## 2020-08-06 DIAGNOSIS — N2581 Secondary hyperparathyroidism of renal origin: Secondary | ICD-10-CM | POA: Diagnosis not present

## 2020-08-09 DIAGNOSIS — D689 Coagulation defect, unspecified: Secondary | ICD-10-CM | POA: Diagnosis not present

## 2020-08-09 DIAGNOSIS — Z992 Dependence on renal dialysis: Secondary | ICD-10-CM | POA: Diagnosis not present

## 2020-08-09 DIAGNOSIS — D631 Anemia in chronic kidney disease: Secondary | ICD-10-CM | POA: Diagnosis not present

## 2020-08-09 DIAGNOSIS — N2581 Secondary hyperparathyroidism of renal origin: Secondary | ICD-10-CM | POA: Diagnosis not present

## 2020-08-09 DIAGNOSIS — D509 Iron deficiency anemia, unspecified: Secondary | ICD-10-CM | POA: Diagnosis not present

## 2020-08-09 DIAGNOSIS — N186 End stage renal disease: Secondary | ICD-10-CM | POA: Diagnosis not present

## 2020-08-11 DIAGNOSIS — Z992 Dependence on renal dialysis: Secondary | ICD-10-CM | POA: Diagnosis not present

## 2020-08-11 DIAGNOSIS — D509 Iron deficiency anemia, unspecified: Secondary | ICD-10-CM | POA: Diagnosis not present

## 2020-08-11 DIAGNOSIS — N2581 Secondary hyperparathyroidism of renal origin: Secondary | ICD-10-CM | POA: Diagnosis not present

## 2020-08-11 DIAGNOSIS — N186 End stage renal disease: Secondary | ICD-10-CM | POA: Diagnosis not present

## 2020-08-11 DIAGNOSIS — D689 Coagulation defect, unspecified: Secondary | ICD-10-CM | POA: Diagnosis not present

## 2020-08-11 DIAGNOSIS — D631 Anemia in chronic kidney disease: Secondary | ICD-10-CM | POA: Diagnosis not present

## 2020-08-12 DIAGNOSIS — G4730 Sleep apnea, unspecified: Secondary | ICD-10-CM | POA: Diagnosis not present

## 2020-08-12 DIAGNOSIS — R0683 Snoring: Secondary | ICD-10-CM | POA: Diagnosis not present

## 2020-08-13 DIAGNOSIS — D689 Coagulation defect, unspecified: Secondary | ICD-10-CM | POA: Diagnosis not present

## 2020-08-13 DIAGNOSIS — D509 Iron deficiency anemia, unspecified: Secondary | ICD-10-CM | POA: Diagnosis not present

## 2020-08-13 DIAGNOSIS — Z992 Dependence on renal dialysis: Secondary | ICD-10-CM | POA: Diagnosis not present

## 2020-08-13 DIAGNOSIS — N186 End stage renal disease: Secondary | ICD-10-CM | POA: Diagnosis not present

## 2020-08-13 DIAGNOSIS — D631 Anemia in chronic kidney disease: Secondary | ICD-10-CM | POA: Diagnosis not present

## 2020-08-13 DIAGNOSIS — N2581 Secondary hyperparathyroidism of renal origin: Secondary | ICD-10-CM | POA: Diagnosis not present

## 2020-08-16 DIAGNOSIS — D509 Iron deficiency anemia, unspecified: Secondary | ICD-10-CM | POA: Diagnosis not present

## 2020-08-16 DIAGNOSIS — Z992 Dependence on renal dialysis: Secondary | ICD-10-CM | POA: Diagnosis not present

## 2020-08-16 DIAGNOSIS — N186 End stage renal disease: Secondary | ICD-10-CM | POA: Diagnosis not present

## 2020-08-16 DIAGNOSIS — D631 Anemia in chronic kidney disease: Secondary | ICD-10-CM | POA: Diagnosis not present

## 2020-08-16 DIAGNOSIS — D689 Coagulation defect, unspecified: Secondary | ICD-10-CM | POA: Diagnosis not present

## 2020-08-16 DIAGNOSIS — N2581 Secondary hyperparathyroidism of renal origin: Secondary | ICD-10-CM | POA: Diagnosis not present

## 2020-08-18 DIAGNOSIS — M3219 Other organ or system involvement in systemic lupus erythematosus: Secondary | ICD-10-CM | POA: Diagnosis not present

## 2020-08-18 DIAGNOSIS — D631 Anemia in chronic kidney disease: Secondary | ICD-10-CM | POA: Diagnosis not present

## 2020-08-18 DIAGNOSIS — N186 End stage renal disease: Secondary | ICD-10-CM | POA: Diagnosis not present

## 2020-08-18 DIAGNOSIS — I272 Pulmonary hypertension, unspecified: Secondary | ICD-10-CM | POA: Diagnosis not present

## 2020-08-18 DIAGNOSIS — I509 Heart failure, unspecified: Secondary | ICD-10-CM | POA: Diagnosis not present

## 2020-08-18 DIAGNOSIS — D689 Coagulation defect, unspecified: Secondary | ICD-10-CM | POA: Diagnosis not present

## 2020-08-18 DIAGNOSIS — D509 Iron deficiency anemia, unspecified: Secondary | ICD-10-CM | POA: Diagnosis not present

## 2020-08-18 DIAGNOSIS — N2581 Secondary hyperparathyroidism of renal origin: Secondary | ICD-10-CM | POA: Diagnosis not present

## 2020-08-18 DIAGNOSIS — Z992 Dependence on renal dialysis: Secondary | ICD-10-CM | POA: Diagnosis not present

## 2020-08-20 DIAGNOSIS — N186 End stage renal disease: Secondary | ICD-10-CM | POA: Diagnosis not present

## 2020-08-20 DIAGNOSIS — D509 Iron deficiency anemia, unspecified: Secondary | ICD-10-CM | POA: Diagnosis not present

## 2020-08-20 DIAGNOSIS — N2581 Secondary hyperparathyroidism of renal origin: Secondary | ICD-10-CM | POA: Diagnosis not present

## 2020-08-20 DIAGNOSIS — D689 Coagulation defect, unspecified: Secondary | ICD-10-CM | POA: Diagnosis not present

## 2020-08-20 DIAGNOSIS — Z992 Dependence on renal dialysis: Secondary | ICD-10-CM | POA: Diagnosis not present

## 2020-08-20 DIAGNOSIS — D631 Anemia in chronic kidney disease: Secondary | ICD-10-CM | POA: Diagnosis not present

## 2020-08-22 DIAGNOSIS — I158 Other secondary hypertension: Secondary | ICD-10-CM | POA: Diagnosis not present

## 2020-08-22 DIAGNOSIS — N186 End stage renal disease: Secondary | ICD-10-CM | POA: Diagnosis not present

## 2020-08-22 DIAGNOSIS — Z992 Dependence on renal dialysis: Secondary | ICD-10-CM | POA: Diagnosis not present

## 2020-08-23 DIAGNOSIS — N186 End stage renal disease: Secondary | ICD-10-CM | POA: Diagnosis not present

## 2020-08-23 DIAGNOSIS — I158 Other secondary hypertension: Secondary | ICD-10-CM | POA: Diagnosis not present

## 2020-08-23 DIAGNOSIS — Z992 Dependence on renal dialysis: Secondary | ICD-10-CM | POA: Diagnosis not present

## 2020-08-23 DIAGNOSIS — N2581 Secondary hyperparathyroidism of renal origin: Secondary | ICD-10-CM | POA: Diagnosis not present

## 2020-08-23 DIAGNOSIS — D689 Coagulation defect, unspecified: Secondary | ICD-10-CM | POA: Diagnosis not present

## 2020-08-25 DIAGNOSIS — N186 End stage renal disease: Secondary | ICD-10-CM | POA: Diagnosis not present

## 2020-08-25 DIAGNOSIS — Z992 Dependence on renal dialysis: Secondary | ICD-10-CM | POA: Diagnosis not present

## 2020-08-25 DIAGNOSIS — D689 Coagulation defect, unspecified: Secondary | ICD-10-CM | POA: Diagnosis not present

## 2020-08-25 DIAGNOSIS — N2581 Secondary hyperparathyroidism of renal origin: Secondary | ICD-10-CM | POA: Diagnosis not present

## 2020-08-27 DIAGNOSIS — Z992 Dependence on renal dialysis: Secondary | ICD-10-CM | POA: Diagnosis not present

## 2020-08-27 DIAGNOSIS — N186 End stage renal disease: Secondary | ICD-10-CM | POA: Diagnosis not present

## 2020-08-27 DIAGNOSIS — N2581 Secondary hyperparathyroidism of renal origin: Secondary | ICD-10-CM | POA: Diagnosis not present

## 2020-08-27 DIAGNOSIS — D689 Coagulation defect, unspecified: Secondary | ICD-10-CM | POA: Diagnosis not present

## 2020-08-31 DIAGNOSIS — I9589 Other hypotension: Secondary | ICD-10-CM | POA: Diagnosis not present

## 2020-08-31 DIAGNOSIS — Z992 Dependence on renal dialysis: Secondary | ICD-10-CM | POA: Diagnosis not present

## 2020-08-31 DIAGNOSIS — D689 Coagulation defect, unspecified: Secondary | ICD-10-CM | POA: Diagnosis not present

## 2020-08-31 DIAGNOSIS — N186 End stage renal disease: Secondary | ICD-10-CM | POA: Diagnosis not present

## 2020-08-31 DIAGNOSIS — N2581 Secondary hyperparathyroidism of renal origin: Secondary | ICD-10-CM | POA: Diagnosis not present

## 2020-08-31 DIAGNOSIS — D631 Anemia in chronic kidney disease: Secondary | ICD-10-CM | POA: Diagnosis not present

## 2020-09-02 DIAGNOSIS — D631 Anemia in chronic kidney disease: Secondary | ICD-10-CM | POA: Diagnosis not present

## 2020-09-02 DIAGNOSIS — Z992 Dependence on renal dialysis: Secondary | ICD-10-CM | POA: Diagnosis not present

## 2020-09-02 DIAGNOSIS — I9589 Other hypotension: Secondary | ICD-10-CM | POA: Diagnosis not present

## 2020-09-02 DIAGNOSIS — D689 Coagulation defect, unspecified: Secondary | ICD-10-CM | POA: Diagnosis not present

## 2020-09-02 DIAGNOSIS — N186 End stage renal disease: Secondary | ICD-10-CM | POA: Diagnosis not present

## 2020-09-02 DIAGNOSIS — N2581 Secondary hyperparathyroidism of renal origin: Secondary | ICD-10-CM | POA: Diagnosis not present

## 2020-09-04 DIAGNOSIS — D689 Coagulation defect, unspecified: Secondary | ICD-10-CM | POA: Diagnosis not present

## 2020-09-04 DIAGNOSIS — D631 Anemia in chronic kidney disease: Secondary | ICD-10-CM | POA: Diagnosis not present

## 2020-09-04 DIAGNOSIS — Z992 Dependence on renal dialysis: Secondary | ICD-10-CM | POA: Diagnosis not present

## 2020-09-04 DIAGNOSIS — I9589 Other hypotension: Secondary | ICD-10-CM | POA: Diagnosis not present

## 2020-09-04 DIAGNOSIS — N2581 Secondary hyperparathyroidism of renal origin: Secondary | ICD-10-CM | POA: Diagnosis not present

## 2020-09-04 DIAGNOSIS — N186 End stage renal disease: Secondary | ICD-10-CM | POA: Diagnosis not present

## 2020-09-06 ENCOUNTER — Other Ambulatory Visit: Payer: Self-pay | Admitting: Gastroenterology

## 2020-09-07 DIAGNOSIS — N186 End stage renal disease: Secondary | ICD-10-CM | POA: Diagnosis not present

## 2020-09-07 DIAGNOSIS — Z992 Dependence on renal dialysis: Secondary | ICD-10-CM | POA: Diagnosis not present

## 2020-09-07 DIAGNOSIS — I9589 Other hypotension: Secondary | ICD-10-CM | POA: Diagnosis not present

## 2020-09-07 DIAGNOSIS — D631 Anemia in chronic kidney disease: Secondary | ICD-10-CM | POA: Diagnosis not present

## 2020-09-07 DIAGNOSIS — D689 Coagulation defect, unspecified: Secondary | ICD-10-CM | POA: Diagnosis not present

## 2020-09-07 DIAGNOSIS — N2581 Secondary hyperparathyroidism of renal origin: Secondary | ICD-10-CM | POA: Diagnosis not present

## 2020-09-09 DIAGNOSIS — I9589 Other hypotension: Secondary | ICD-10-CM | POA: Diagnosis not present

## 2020-09-09 DIAGNOSIS — N2581 Secondary hyperparathyroidism of renal origin: Secondary | ICD-10-CM | POA: Diagnosis not present

## 2020-09-09 DIAGNOSIS — D689 Coagulation defect, unspecified: Secondary | ICD-10-CM | POA: Diagnosis not present

## 2020-09-09 DIAGNOSIS — D631 Anemia in chronic kidney disease: Secondary | ICD-10-CM | POA: Diagnosis not present

## 2020-09-09 DIAGNOSIS — N186 End stage renal disease: Secondary | ICD-10-CM | POA: Diagnosis not present

## 2020-09-09 DIAGNOSIS — Z992 Dependence on renal dialysis: Secondary | ICD-10-CM | POA: Diagnosis not present

## 2020-09-11 DIAGNOSIS — D631 Anemia in chronic kidney disease: Secondary | ICD-10-CM | POA: Diagnosis not present

## 2020-09-11 DIAGNOSIS — D689 Coagulation defect, unspecified: Secondary | ICD-10-CM | POA: Diagnosis not present

## 2020-09-11 DIAGNOSIS — I9589 Other hypotension: Secondary | ICD-10-CM | POA: Diagnosis not present

## 2020-09-11 DIAGNOSIS — N2581 Secondary hyperparathyroidism of renal origin: Secondary | ICD-10-CM | POA: Diagnosis not present

## 2020-09-11 DIAGNOSIS — N186 End stage renal disease: Secondary | ICD-10-CM | POA: Diagnosis not present

## 2020-09-11 DIAGNOSIS — Z992 Dependence on renal dialysis: Secondary | ICD-10-CM | POA: Diagnosis not present

## 2020-09-13 DIAGNOSIS — D689 Coagulation defect, unspecified: Secondary | ICD-10-CM | POA: Diagnosis not present

## 2020-09-13 DIAGNOSIS — I9589 Other hypotension: Secondary | ICD-10-CM | POA: Diagnosis not present

## 2020-09-13 DIAGNOSIS — N186 End stage renal disease: Secondary | ICD-10-CM | POA: Diagnosis not present

## 2020-09-13 DIAGNOSIS — N2581 Secondary hyperparathyroidism of renal origin: Secondary | ICD-10-CM | POA: Diagnosis not present

## 2020-09-13 DIAGNOSIS — Z992 Dependence on renal dialysis: Secondary | ICD-10-CM | POA: Diagnosis not present

## 2020-09-13 DIAGNOSIS — D631 Anemia in chronic kidney disease: Secondary | ICD-10-CM | POA: Diagnosis not present

## 2020-09-15 DIAGNOSIS — N186 End stage renal disease: Secondary | ICD-10-CM | POA: Diagnosis not present

## 2020-09-15 DIAGNOSIS — I9589 Other hypotension: Secondary | ICD-10-CM | POA: Diagnosis not present

## 2020-09-15 DIAGNOSIS — Z992 Dependence on renal dialysis: Secondary | ICD-10-CM | POA: Diagnosis not present

## 2020-09-15 DIAGNOSIS — N2581 Secondary hyperparathyroidism of renal origin: Secondary | ICD-10-CM | POA: Diagnosis not present

## 2020-09-15 DIAGNOSIS — D631 Anemia in chronic kidney disease: Secondary | ICD-10-CM | POA: Diagnosis not present

## 2020-09-15 DIAGNOSIS — D689 Coagulation defect, unspecified: Secondary | ICD-10-CM | POA: Diagnosis not present

## 2020-09-18 DIAGNOSIS — N186 End stage renal disease: Secondary | ICD-10-CM | POA: Diagnosis not present

## 2020-09-18 DIAGNOSIS — Z992 Dependence on renal dialysis: Secondary | ICD-10-CM | POA: Diagnosis not present

## 2020-09-18 DIAGNOSIS — N2581 Secondary hyperparathyroidism of renal origin: Secondary | ICD-10-CM | POA: Diagnosis not present

## 2020-09-18 DIAGNOSIS — M3219 Other organ or system involvement in systemic lupus erythematosus: Secondary | ICD-10-CM | POA: Diagnosis not present

## 2020-09-18 DIAGNOSIS — D689 Coagulation defect, unspecified: Secondary | ICD-10-CM | POA: Diagnosis not present

## 2020-09-18 DIAGNOSIS — D631 Anemia in chronic kidney disease: Secondary | ICD-10-CM | POA: Diagnosis not present

## 2020-09-18 DIAGNOSIS — I272 Pulmonary hypertension, unspecified: Secondary | ICD-10-CM | POA: Diagnosis not present

## 2020-09-18 DIAGNOSIS — I509 Heart failure, unspecified: Secondary | ICD-10-CM | POA: Diagnosis not present

## 2020-09-18 DIAGNOSIS — I9589 Other hypotension: Secondary | ICD-10-CM | POA: Diagnosis not present

## 2020-09-21 DIAGNOSIS — D689 Coagulation defect, unspecified: Secondary | ICD-10-CM | POA: Diagnosis not present

## 2020-09-21 DIAGNOSIS — N2581 Secondary hyperparathyroidism of renal origin: Secondary | ICD-10-CM | POA: Diagnosis not present

## 2020-09-21 DIAGNOSIS — D631 Anemia in chronic kidney disease: Secondary | ICD-10-CM | POA: Diagnosis not present

## 2020-09-21 DIAGNOSIS — Z992 Dependence on renal dialysis: Secondary | ICD-10-CM | POA: Diagnosis not present

## 2020-09-21 DIAGNOSIS — N186 End stage renal disease: Secondary | ICD-10-CM | POA: Diagnosis not present

## 2020-09-21 DIAGNOSIS — I9589 Other hypotension: Secondary | ICD-10-CM | POA: Diagnosis not present

## 2020-09-23 DIAGNOSIS — D631 Anemia in chronic kidney disease: Secondary | ICD-10-CM | POA: Diagnosis not present

## 2020-09-23 DIAGNOSIS — Z992 Dependence on renal dialysis: Secondary | ICD-10-CM | POA: Diagnosis not present

## 2020-09-23 DIAGNOSIS — N186 End stage renal disease: Secondary | ICD-10-CM | POA: Diagnosis not present

## 2020-09-23 DIAGNOSIS — D509 Iron deficiency anemia, unspecified: Secondary | ICD-10-CM | POA: Diagnosis not present

## 2020-09-23 DIAGNOSIS — N2581 Secondary hyperparathyroidism of renal origin: Secondary | ICD-10-CM | POA: Diagnosis not present

## 2020-09-23 DIAGNOSIS — R110 Nausea: Secondary | ICD-10-CM | POA: Diagnosis not present

## 2020-09-23 DIAGNOSIS — D689 Coagulation defect, unspecified: Secondary | ICD-10-CM | POA: Diagnosis not present

## 2020-09-25 DIAGNOSIS — D631 Anemia in chronic kidney disease: Secondary | ICD-10-CM | POA: Diagnosis not present

## 2020-09-25 DIAGNOSIS — Z992 Dependence on renal dialysis: Secondary | ICD-10-CM | POA: Diagnosis not present

## 2020-09-25 DIAGNOSIS — R110 Nausea: Secondary | ICD-10-CM | POA: Diagnosis not present

## 2020-09-25 DIAGNOSIS — D509 Iron deficiency anemia, unspecified: Secondary | ICD-10-CM | POA: Diagnosis not present

## 2020-09-25 DIAGNOSIS — N2581 Secondary hyperparathyroidism of renal origin: Secondary | ICD-10-CM | POA: Diagnosis not present

## 2020-09-25 DIAGNOSIS — D689 Coagulation defect, unspecified: Secondary | ICD-10-CM | POA: Diagnosis not present

## 2020-09-25 DIAGNOSIS — N186 End stage renal disease: Secondary | ICD-10-CM | POA: Diagnosis not present

## 2020-09-27 DIAGNOSIS — Z992 Dependence on renal dialysis: Secondary | ICD-10-CM | POA: Diagnosis not present

## 2020-09-27 DIAGNOSIS — N2581 Secondary hyperparathyroidism of renal origin: Secondary | ICD-10-CM | POA: Diagnosis not present

## 2020-09-27 DIAGNOSIS — R110 Nausea: Secondary | ICD-10-CM | POA: Diagnosis not present

## 2020-09-27 DIAGNOSIS — D631 Anemia in chronic kidney disease: Secondary | ICD-10-CM | POA: Diagnosis not present

## 2020-09-27 DIAGNOSIS — D509 Iron deficiency anemia, unspecified: Secondary | ICD-10-CM | POA: Diagnosis not present

## 2020-09-27 DIAGNOSIS — N186 End stage renal disease: Secondary | ICD-10-CM | POA: Diagnosis not present

## 2020-09-27 DIAGNOSIS — D689 Coagulation defect, unspecified: Secondary | ICD-10-CM | POA: Diagnosis not present

## 2020-09-28 DIAGNOSIS — D689 Coagulation defect, unspecified: Secondary | ICD-10-CM | POA: Diagnosis not present

## 2020-09-28 DIAGNOSIS — R110 Nausea: Secondary | ICD-10-CM | POA: Diagnosis not present

## 2020-09-28 DIAGNOSIS — N186 End stage renal disease: Secondary | ICD-10-CM | POA: Diagnosis not present

## 2020-09-28 DIAGNOSIS — D631 Anemia in chronic kidney disease: Secondary | ICD-10-CM | POA: Diagnosis not present

## 2020-09-28 DIAGNOSIS — N2581 Secondary hyperparathyroidism of renal origin: Secondary | ICD-10-CM | POA: Diagnosis not present

## 2020-09-28 DIAGNOSIS — Z992 Dependence on renal dialysis: Secondary | ICD-10-CM | POA: Diagnosis not present

## 2020-09-28 DIAGNOSIS — D509 Iron deficiency anemia, unspecified: Secondary | ICD-10-CM | POA: Diagnosis not present

## 2020-09-30 DIAGNOSIS — Z992 Dependence on renal dialysis: Secondary | ICD-10-CM | POA: Diagnosis not present

## 2020-09-30 DIAGNOSIS — D509 Iron deficiency anemia, unspecified: Secondary | ICD-10-CM | POA: Diagnosis not present

## 2020-09-30 DIAGNOSIS — N2581 Secondary hyperparathyroidism of renal origin: Secondary | ICD-10-CM | POA: Diagnosis not present

## 2020-09-30 DIAGNOSIS — N186 End stage renal disease: Secondary | ICD-10-CM | POA: Diagnosis not present

## 2020-09-30 DIAGNOSIS — R110 Nausea: Secondary | ICD-10-CM | POA: Diagnosis not present

## 2020-09-30 DIAGNOSIS — D689 Coagulation defect, unspecified: Secondary | ICD-10-CM | POA: Diagnosis not present

## 2020-09-30 DIAGNOSIS — D631 Anemia in chronic kidney disease: Secondary | ICD-10-CM | POA: Diagnosis not present

## 2020-10-01 DIAGNOSIS — R109 Unspecified abdominal pain: Secondary | ICD-10-CM | POA: Diagnosis not present

## 2020-10-01 DIAGNOSIS — K219 Gastro-esophageal reflux disease without esophagitis: Secondary | ICD-10-CM | POA: Diagnosis not present

## 2020-10-02 DIAGNOSIS — Z992 Dependence on renal dialysis: Secondary | ICD-10-CM | POA: Diagnosis not present

## 2020-10-02 DIAGNOSIS — N2581 Secondary hyperparathyroidism of renal origin: Secondary | ICD-10-CM | POA: Diagnosis not present

## 2020-10-02 DIAGNOSIS — D689 Coagulation defect, unspecified: Secondary | ICD-10-CM | POA: Diagnosis not present

## 2020-10-02 DIAGNOSIS — D509 Iron deficiency anemia, unspecified: Secondary | ICD-10-CM | POA: Diagnosis not present

## 2020-10-02 DIAGNOSIS — N186 End stage renal disease: Secondary | ICD-10-CM | POA: Diagnosis not present

## 2020-10-02 DIAGNOSIS — R110 Nausea: Secondary | ICD-10-CM | POA: Diagnosis not present

## 2020-10-02 DIAGNOSIS — D631 Anemia in chronic kidney disease: Secondary | ICD-10-CM | POA: Diagnosis not present

## 2020-10-05 DIAGNOSIS — N2581 Secondary hyperparathyroidism of renal origin: Secondary | ICD-10-CM | POA: Diagnosis not present

## 2020-10-05 DIAGNOSIS — D689 Coagulation defect, unspecified: Secondary | ICD-10-CM | POA: Diagnosis not present

## 2020-10-05 DIAGNOSIS — D509 Iron deficiency anemia, unspecified: Secondary | ICD-10-CM | POA: Diagnosis not present

## 2020-10-05 DIAGNOSIS — N186 End stage renal disease: Secondary | ICD-10-CM | POA: Diagnosis not present

## 2020-10-05 DIAGNOSIS — R110 Nausea: Secondary | ICD-10-CM | POA: Diagnosis not present

## 2020-10-05 DIAGNOSIS — D631 Anemia in chronic kidney disease: Secondary | ICD-10-CM | POA: Diagnosis not present

## 2020-10-05 DIAGNOSIS — Z992 Dependence on renal dialysis: Secondary | ICD-10-CM | POA: Diagnosis not present

## 2020-10-07 DIAGNOSIS — R110 Nausea: Secondary | ICD-10-CM | POA: Diagnosis not present

## 2020-10-07 DIAGNOSIS — D509 Iron deficiency anemia, unspecified: Secondary | ICD-10-CM | POA: Diagnosis not present

## 2020-10-07 DIAGNOSIS — D631 Anemia in chronic kidney disease: Secondary | ICD-10-CM | POA: Diagnosis not present

## 2020-10-07 DIAGNOSIS — Z992 Dependence on renal dialysis: Secondary | ICD-10-CM | POA: Diagnosis not present

## 2020-10-07 DIAGNOSIS — N186 End stage renal disease: Secondary | ICD-10-CM | POA: Diagnosis not present

## 2020-10-07 DIAGNOSIS — D689 Coagulation defect, unspecified: Secondary | ICD-10-CM | POA: Diagnosis not present

## 2020-10-07 DIAGNOSIS — N2581 Secondary hyperparathyroidism of renal origin: Secondary | ICD-10-CM | POA: Diagnosis not present

## 2020-10-09 ENCOUNTER — Other Ambulatory Visit: Payer: Self-pay | Admitting: Gastroenterology

## 2020-10-09 DIAGNOSIS — R110 Nausea: Secondary | ICD-10-CM | POA: Diagnosis not present

## 2020-10-09 DIAGNOSIS — N186 End stage renal disease: Secondary | ICD-10-CM | POA: Diagnosis not present

## 2020-10-09 DIAGNOSIS — Z992 Dependence on renal dialysis: Secondary | ICD-10-CM | POA: Diagnosis not present

## 2020-10-09 DIAGNOSIS — N2581 Secondary hyperparathyroidism of renal origin: Secondary | ICD-10-CM | POA: Diagnosis not present

## 2020-10-09 DIAGNOSIS — D509 Iron deficiency anemia, unspecified: Secondary | ICD-10-CM | POA: Diagnosis not present

## 2020-10-09 DIAGNOSIS — D631 Anemia in chronic kidney disease: Secondary | ICD-10-CM | POA: Diagnosis not present

## 2020-10-09 DIAGNOSIS — D689 Coagulation defect, unspecified: Secondary | ICD-10-CM | POA: Diagnosis not present

## 2020-10-12 DIAGNOSIS — N2581 Secondary hyperparathyroidism of renal origin: Secondary | ICD-10-CM | POA: Diagnosis not present

## 2020-10-12 DIAGNOSIS — D689 Coagulation defect, unspecified: Secondary | ICD-10-CM | POA: Diagnosis not present

## 2020-10-12 DIAGNOSIS — N186 End stage renal disease: Secondary | ICD-10-CM | POA: Diagnosis not present

## 2020-10-12 DIAGNOSIS — D509 Iron deficiency anemia, unspecified: Secondary | ICD-10-CM | POA: Diagnosis not present

## 2020-10-12 DIAGNOSIS — D631 Anemia in chronic kidney disease: Secondary | ICD-10-CM | POA: Diagnosis not present

## 2020-10-12 DIAGNOSIS — R110 Nausea: Secondary | ICD-10-CM | POA: Diagnosis not present

## 2020-10-12 DIAGNOSIS — Z992 Dependence on renal dialysis: Secondary | ICD-10-CM | POA: Diagnosis not present

## 2020-10-14 DIAGNOSIS — D631 Anemia in chronic kidney disease: Secondary | ICD-10-CM | POA: Diagnosis not present

## 2020-10-14 DIAGNOSIS — N2581 Secondary hyperparathyroidism of renal origin: Secondary | ICD-10-CM | POA: Diagnosis not present

## 2020-10-14 DIAGNOSIS — R110 Nausea: Secondary | ICD-10-CM | POA: Diagnosis not present

## 2020-10-14 DIAGNOSIS — D689 Coagulation defect, unspecified: Secondary | ICD-10-CM | POA: Diagnosis not present

## 2020-10-14 DIAGNOSIS — Z992 Dependence on renal dialysis: Secondary | ICD-10-CM | POA: Diagnosis not present

## 2020-10-14 DIAGNOSIS — D509 Iron deficiency anemia, unspecified: Secondary | ICD-10-CM | POA: Diagnosis not present

## 2020-10-14 DIAGNOSIS — N186 End stage renal disease: Secondary | ICD-10-CM | POA: Diagnosis not present

## 2020-10-17 DIAGNOSIS — R110 Nausea: Secondary | ICD-10-CM | POA: Diagnosis not present

## 2020-10-17 DIAGNOSIS — D509 Iron deficiency anemia, unspecified: Secondary | ICD-10-CM | POA: Diagnosis not present

## 2020-10-17 DIAGNOSIS — Z992 Dependence on renal dialysis: Secondary | ICD-10-CM | POA: Diagnosis not present

## 2020-10-17 DIAGNOSIS — D631 Anemia in chronic kidney disease: Secondary | ICD-10-CM | POA: Diagnosis not present

## 2020-10-17 DIAGNOSIS — N2581 Secondary hyperparathyroidism of renal origin: Secondary | ICD-10-CM | POA: Diagnosis not present

## 2020-10-17 DIAGNOSIS — D689 Coagulation defect, unspecified: Secondary | ICD-10-CM | POA: Diagnosis not present

## 2020-10-17 DIAGNOSIS — N186 End stage renal disease: Secondary | ICD-10-CM | POA: Diagnosis not present

## 2020-10-18 DIAGNOSIS — I509 Heart failure, unspecified: Secondary | ICD-10-CM | POA: Diagnosis not present

## 2020-10-18 DIAGNOSIS — Z20822 Contact with and (suspected) exposure to covid-19: Secondary | ICD-10-CM | POA: Diagnosis not present

## 2020-10-18 DIAGNOSIS — M3219 Other organ or system involvement in systemic lupus erythematosus: Secondary | ICD-10-CM | POA: Diagnosis not present

## 2020-10-18 DIAGNOSIS — I272 Pulmonary hypertension, unspecified: Secondary | ICD-10-CM | POA: Diagnosis not present

## 2020-10-19 DIAGNOSIS — N186 End stage renal disease: Secondary | ICD-10-CM | POA: Diagnosis not present

## 2020-10-19 DIAGNOSIS — D689 Coagulation defect, unspecified: Secondary | ICD-10-CM | POA: Diagnosis not present

## 2020-10-19 DIAGNOSIS — Z992 Dependence on renal dialysis: Secondary | ICD-10-CM | POA: Diagnosis not present

## 2020-10-19 DIAGNOSIS — D509 Iron deficiency anemia, unspecified: Secondary | ICD-10-CM | POA: Diagnosis not present

## 2020-10-19 DIAGNOSIS — Z20822 Contact with and (suspected) exposure to covid-19: Secondary | ICD-10-CM | POA: Diagnosis not present

## 2020-10-19 DIAGNOSIS — N2581 Secondary hyperparathyroidism of renal origin: Secondary | ICD-10-CM | POA: Diagnosis not present

## 2020-10-19 DIAGNOSIS — R110 Nausea: Secondary | ICD-10-CM | POA: Diagnosis not present

## 2020-10-19 DIAGNOSIS — D631 Anemia in chronic kidney disease: Secondary | ICD-10-CM | POA: Diagnosis not present

## 2020-10-21 DIAGNOSIS — Z992 Dependence on renal dialysis: Secondary | ICD-10-CM | POA: Diagnosis not present

## 2020-10-21 DIAGNOSIS — D509 Iron deficiency anemia, unspecified: Secondary | ICD-10-CM | POA: Diagnosis not present

## 2020-10-21 DIAGNOSIS — D689 Coagulation defect, unspecified: Secondary | ICD-10-CM | POA: Diagnosis not present

## 2020-10-21 DIAGNOSIS — R110 Nausea: Secondary | ICD-10-CM | POA: Diagnosis not present

## 2020-10-21 DIAGNOSIS — N186 End stage renal disease: Secondary | ICD-10-CM | POA: Diagnosis not present

## 2020-10-21 DIAGNOSIS — N2581 Secondary hyperparathyroidism of renal origin: Secondary | ICD-10-CM | POA: Diagnosis not present

## 2020-10-21 DIAGNOSIS — D631 Anemia in chronic kidney disease: Secondary | ICD-10-CM | POA: Diagnosis not present

## 2020-10-22 DIAGNOSIS — N186 End stage renal disease: Secondary | ICD-10-CM | POA: Diagnosis not present

## 2020-10-22 DIAGNOSIS — Z992 Dependence on renal dialysis: Secondary | ICD-10-CM | POA: Diagnosis not present

## 2020-10-22 DIAGNOSIS — I272 Pulmonary hypertension, unspecified: Secondary | ICD-10-CM | POA: Diagnosis not present

## 2020-10-22 DIAGNOSIS — I9589 Other hypotension: Secondary | ICD-10-CM | POA: Diagnosis not present

## 2020-10-24 DIAGNOSIS — Z992 Dependence on renal dialysis: Secondary | ICD-10-CM | POA: Diagnosis not present

## 2020-10-24 DIAGNOSIS — D631 Anemia in chronic kidney disease: Secondary | ICD-10-CM | POA: Diagnosis not present

## 2020-10-24 DIAGNOSIS — D509 Iron deficiency anemia, unspecified: Secondary | ICD-10-CM | POA: Diagnosis not present

## 2020-10-24 DIAGNOSIS — D689 Coagulation defect, unspecified: Secondary | ICD-10-CM | POA: Diagnosis not present

## 2020-10-24 DIAGNOSIS — N186 End stage renal disease: Secondary | ICD-10-CM | POA: Diagnosis not present

## 2020-10-24 DIAGNOSIS — N2581 Secondary hyperparathyroidism of renal origin: Secondary | ICD-10-CM | POA: Diagnosis not present

## 2020-10-26 DIAGNOSIS — D509 Iron deficiency anemia, unspecified: Secondary | ICD-10-CM | POA: Diagnosis not present

## 2020-10-26 DIAGNOSIS — N2581 Secondary hyperparathyroidism of renal origin: Secondary | ICD-10-CM | POA: Diagnosis not present

## 2020-10-26 DIAGNOSIS — Z992 Dependence on renal dialysis: Secondary | ICD-10-CM | POA: Diagnosis not present

## 2020-10-26 DIAGNOSIS — D631 Anemia in chronic kidney disease: Secondary | ICD-10-CM | POA: Diagnosis not present

## 2020-10-26 DIAGNOSIS — D689 Coagulation defect, unspecified: Secondary | ICD-10-CM | POA: Diagnosis not present

## 2020-10-26 DIAGNOSIS — N186 End stage renal disease: Secondary | ICD-10-CM | POA: Diagnosis not present

## 2020-10-28 DIAGNOSIS — Z992 Dependence on renal dialysis: Secondary | ICD-10-CM | POA: Diagnosis not present

## 2020-10-28 DIAGNOSIS — D509 Iron deficiency anemia, unspecified: Secondary | ICD-10-CM | POA: Diagnosis not present

## 2020-10-28 DIAGNOSIS — N2581 Secondary hyperparathyroidism of renal origin: Secondary | ICD-10-CM | POA: Diagnosis not present

## 2020-10-28 DIAGNOSIS — N186 End stage renal disease: Secondary | ICD-10-CM | POA: Diagnosis not present

## 2020-10-28 DIAGNOSIS — D631 Anemia in chronic kidney disease: Secondary | ICD-10-CM | POA: Diagnosis not present

## 2020-10-28 DIAGNOSIS — D689 Coagulation defect, unspecified: Secondary | ICD-10-CM | POA: Diagnosis not present

## 2020-10-30 DIAGNOSIS — D689 Coagulation defect, unspecified: Secondary | ICD-10-CM | POA: Diagnosis not present

## 2020-10-30 DIAGNOSIS — N186 End stage renal disease: Secondary | ICD-10-CM | POA: Diagnosis not present

## 2020-10-30 DIAGNOSIS — D509 Iron deficiency anemia, unspecified: Secondary | ICD-10-CM | POA: Diagnosis not present

## 2020-10-30 DIAGNOSIS — Z992 Dependence on renal dialysis: Secondary | ICD-10-CM | POA: Diagnosis not present

## 2020-10-30 DIAGNOSIS — N2581 Secondary hyperparathyroidism of renal origin: Secondary | ICD-10-CM | POA: Diagnosis not present

## 2020-10-30 DIAGNOSIS — D631 Anemia in chronic kidney disease: Secondary | ICD-10-CM | POA: Diagnosis not present

## 2020-11-02 DIAGNOSIS — D689 Coagulation defect, unspecified: Secondary | ICD-10-CM | POA: Diagnosis not present

## 2020-11-02 DIAGNOSIS — D631 Anemia in chronic kidney disease: Secondary | ICD-10-CM | POA: Diagnosis not present

## 2020-11-02 DIAGNOSIS — N2581 Secondary hyperparathyroidism of renal origin: Secondary | ICD-10-CM | POA: Diagnosis not present

## 2020-11-02 DIAGNOSIS — N186 End stage renal disease: Secondary | ICD-10-CM | POA: Diagnosis not present

## 2020-11-02 DIAGNOSIS — Z992 Dependence on renal dialysis: Secondary | ICD-10-CM | POA: Diagnosis not present

## 2020-11-02 DIAGNOSIS — D509 Iron deficiency anemia, unspecified: Secondary | ICD-10-CM | POA: Diagnosis not present

## 2020-11-04 DIAGNOSIS — D689 Coagulation defect, unspecified: Secondary | ICD-10-CM | POA: Diagnosis not present

## 2020-11-04 DIAGNOSIS — Z992 Dependence on renal dialysis: Secondary | ICD-10-CM | POA: Diagnosis not present

## 2020-11-04 DIAGNOSIS — N186 End stage renal disease: Secondary | ICD-10-CM | POA: Diagnosis not present

## 2020-11-04 DIAGNOSIS — D509 Iron deficiency anemia, unspecified: Secondary | ICD-10-CM | POA: Diagnosis not present

## 2020-11-04 DIAGNOSIS — N2581 Secondary hyperparathyroidism of renal origin: Secondary | ICD-10-CM | POA: Diagnosis not present

## 2020-11-04 DIAGNOSIS — D631 Anemia in chronic kidney disease: Secondary | ICD-10-CM | POA: Diagnosis not present

## 2020-11-06 DIAGNOSIS — D631 Anemia in chronic kidney disease: Secondary | ICD-10-CM | POA: Diagnosis not present

## 2020-11-06 DIAGNOSIS — N186 End stage renal disease: Secondary | ICD-10-CM | POA: Diagnosis not present

## 2020-11-06 DIAGNOSIS — Z992 Dependence on renal dialysis: Secondary | ICD-10-CM | POA: Diagnosis not present

## 2020-11-06 DIAGNOSIS — N2581 Secondary hyperparathyroidism of renal origin: Secondary | ICD-10-CM | POA: Diagnosis not present

## 2020-11-06 DIAGNOSIS — D509 Iron deficiency anemia, unspecified: Secondary | ICD-10-CM | POA: Diagnosis not present

## 2020-11-06 DIAGNOSIS — D689 Coagulation defect, unspecified: Secondary | ICD-10-CM | POA: Diagnosis not present

## 2020-11-08 ENCOUNTER — Encounter: Payer: Self-pay | Admitting: Family Medicine

## 2020-11-08 NOTE — Telephone Encounter (Signed)
She is requesting prednisone and Flexeril for recurrent back pain.    Please Advise.

## 2020-11-09 ENCOUNTER — Other Ambulatory Visit: Payer: Self-pay

## 2020-11-09 DIAGNOSIS — N186 End stage renal disease: Secondary | ICD-10-CM | POA: Diagnosis not present

## 2020-11-09 DIAGNOSIS — D509 Iron deficiency anemia, unspecified: Secondary | ICD-10-CM | POA: Diagnosis not present

## 2020-11-09 DIAGNOSIS — Z992 Dependence on renal dialysis: Secondary | ICD-10-CM | POA: Diagnosis not present

## 2020-11-09 DIAGNOSIS — D631 Anemia in chronic kidney disease: Secondary | ICD-10-CM | POA: Diagnosis not present

## 2020-11-09 DIAGNOSIS — D689 Coagulation defect, unspecified: Secondary | ICD-10-CM | POA: Diagnosis not present

## 2020-11-09 DIAGNOSIS — N2581 Secondary hyperparathyroidism of renal origin: Secondary | ICD-10-CM | POA: Diagnosis not present

## 2020-11-09 MED ORDER — PREDNISONE 20 MG PO TABS
40.0000 mg | ORAL_TABLET | Freq: Every day | ORAL | 0 refills | Status: AC
Start: 2020-11-09 — End: ?

## 2020-11-09 MED ORDER — CYCLOBENZAPRINE HCL 5 MG PO TABS: 2.5000 mg | ORAL_TABLET | Freq: Two times a day (BID) | ORAL | 0 refills | Status: AC | PRN

## 2020-11-11 DIAGNOSIS — D631 Anemia in chronic kidney disease: Secondary | ICD-10-CM | POA: Diagnosis not present

## 2020-11-11 DIAGNOSIS — D689 Coagulation defect, unspecified: Secondary | ICD-10-CM | POA: Diagnosis not present

## 2020-11-11 DIAGNOSIS — N186 End stage renal disease: Secondary | ICD-10-CM | POA: Diagnosis not present

## 2020-11-11 DIAGNOSIS — Z992 Dependence on renal dialysis: Secondary | ICD-10-CM | POA: Diagnosis not present

## 2020-11-11 DIAGNOSIS — D509 Iron deficiency anemia, unspecified: Secondary | ICD-10-CM | POA: Diagnosis not present

## 2020-11-11 DIAGNOSIS — N2581 Secondary hyperparathyroidism of renal origin: Secondary | ICD-10-CM | POA: Diagnosis not present

## 2020-11-14 DIAGNOSIS — N186 End stage renal disease: Secondary | ICD-10-CM | POA: Diagnosis not present

## 2020-11-14 DIAGNOSIS — N2581 Secondary hyperparathyroidism of renal origin: Secondary | ICD-10-CM | POA: Diagnosis not present

## 2020-11-14 DIAGNOSIS — D689 Coagulation defect, unspecified: Secondary | ICD-10-CM | POA: Diagnosis not present

## 2020-11-14 DIAGNOSIS — D631 Anemia in chronic kidney disease: Secondary | ICD-10-CM | POA: Diagnosis not present

## 2020-11-14 DIAGNOSIS — D509 Iron deficiency anemia, unspecified: Secondary | ICD-10-CM | POA: Diagnosis not present

## 2020-11-14 DIAGNOSIS — Z992 Dependence on renal dialysis: Secondary | ICD-10-CM | POA: Diagnosis not present

## 2020-11-16 DIAGNOSIS — D631 Anemia in chronic kidney disease: Secondary | ICD-10-CM | POA: Diagnosis not present

## 2020-11-16 DIAGNOSIS — N186 End stage renal disease: Secondary | ICD-10-CM | POA: Diagnosis not present

## 2020-11-16 DIAGNOSIS — N2581 Secondary hyperparathyroidism of renal origin: Secondary | ICD-10-CM | POA: Diagnosis not present

## 2020-11-16 DIAGNOSIS — Z992 Dependence on renal dialysis: Secondary | ICD-10-CM | POA: Diagnosis not present

## 2020-11-16 DIAGNOSIS — D689 Coagulation defect, unspecified: Secondary | ICD-10-CM | POA: Diagnosis not present

## 2020-11-16 DIAGNOSIS — D509 Iron deficiency anemia, unspecified: Secondary | ICD-10-CM | POA: Diagnosis not present

## 2020-11-17 DIAGNOSIS — I959 Hypotension, unspecified: Secondary | ICD-10-CM | POA: Diagnosis not present

## 2020-11-17 DIAGNOSIS — N184 Chronic kidney disease, stage 4 (severe): Secondary | ICD-10-CM | POA: Diagnosis not present

## 2020-11-17 DIAGNOSIS — I272 Pulmonary hypertension, unspecified: Secondary | ICD-10-CM | POA: Diagnosis not present

## 2020-11-18 DIAGNOSIS — N2581 Secondary hyperparathyroidism of renal origin: Secondary | ICD-10-CM | POA: Diagnosis not present

## 2020-11-18 DIAGNOSIS — M3219 Other organ or system involvement in systemic lupus erythematosus: Secondary | ICD-10-CM | POA: Diagnosis not present

## 2020-11-18 DIAGNOSIS — N186 End stage renal disease: Secondary | ICD-10-CM | POA: Diagnosis not present

## 2020-11-18 DIAGNOSIS — D631 Anemia in chronic kidney disease: Secondary | ICD-10-CM | POA: Diagnosis not present

## 2020-11-18 DIAGNOSIS — I272 Pulmonary hypertension, unspecified: Secondary | ICD-10-CM | POA: Diagnosis not present

## 2020-11-18 DIAGNOSIS — D509 Iron deficiency anemia, unspecified: Secondary | ICD-10-CM | POA: Diagnosis not present

## 2020-11-18 DIAGNOSIS — I509 Heart failure, unspecified: Secondary | ICD-10-CM | POA: Diagnosis not present

## 2020-11-18 DIAGNOSIS — Z992 Dependence on renal dialysis: Secondary | ICD-10-CM | POA: Diagnosis not present

## 2020-11-18 DIAGNOSIS — D689 Coagulation defect, unspecified: Secondary | ICD-10-CM | POA: Diagnosis not present

## 2020-11-19 DIAGNOSIS — T82898A Other specified complication of vascular prosthetic devices, implants and grafts, initial encounter: Secondary | ICD-10-CM | POA: Diagnosis not present

## 2020-11-19 DIAGNOSIS — G459 Transient cerebral ischemic attack, unspecified: Secondary | ICD-10-CM | POA: Diagnosis not present

## 2020-11-19 DIAGNOSIS — I871 Compression of vein: Secondary | ICD-10-CM | POA: Diagnosis not present

## 2020-11-19 DIAGNOSIS — T82858A Stenosis of vascular prosthetic devices, implants and grafts, initial encounter: Secondary | ICD-10-CM | POA: Diagnosis not present

## 2020-11-19 DIAGNOSIS — N186 End stage renal disease: Secondary | ICD-10-CM | POA: Diagnosis not present

## 2020-11-19 DIAGNOSIS — Z992 Dependence on renal dialysis: Secondary | ICD-10-CM | POA: Diagnosis not present

## 2020-11-19 DIAGNOSIS — I120 Hypertensive chronic kidney disease with stage 5 chronic kidney disease or end stage renal disease: Secondary | ICD-10-CM | POA: Diagnosis not present

## 2020-11-20 DIAGNOSIS — Z992 Dependence on renal dialysis: Secondary | ICD-10-CM | POA: Diagnosis not present

## 2020-11-20 DIAGNOSIS — D509 Iron deficiency anemia, unspecified: Secondary | ICD-10-CM | POA: Diagnosis not present

## 2020-11-20 DIAGNOSIS — D631 Anemia in chronic kidney disease: Secondary | ICD-10-CM | POA: Diagnosis not present

## 2020-11-20 DIAGNOSIS — D689 Coagulation defect, unspecified: Secondary | ICD-10-CM | POA: Diagnosis not present

## 2020-11-20 DIAGNOSIS — N186 End stage renal disease: Secondary | ICD-10-CM | POA: Diagnosis not present

## 2020-11-20 DIAGNOSIS — N2581 Secondary hyperparathyroidism of renal origin: Secondary | ICD-10-CM | POA: Diagnosis not present

## 2020-11-22 DIAGNOSIS — N186 End stage renal disease: Secondary | ICD-10-CM | POA: Diagnosis not present

## 2020-11-22 DIAGNOSIS — Z992 Dependence on renal dialysis: Secondary | ICD-10-CM | POA: Diagnosis not present

## 2020-11-23 DIAGNOSIS — N2581 Secondary hyperparathyroidism of renal origin: Secondary | ICD-10-CM | POA: Diagnosis not present

## 2020-11-23 DIAGNOSIS — I9589 Other hypotension: Secondary | ICD-10-CM | POA: Diagnosis not present

## 2020-11-23 DIAGNOSIS — Z992 Dependence on renal dialysis: Secondary | ICD-10-CM | POA: Diagnosis not present

## 2020-11-23 DIAGNOSIS — D631 Anemia in chronic kidney disease: Secondary | ICD-10-CM | POA: Diagnosis not present

## 2020-11-23 DIAGNOSIS — D689 Coagulation defect, unspecified: Secondary | ICD-10-CM | POA: Diagnosis not present

## 2020-11-23 DIAGNOSIS — D509 Iron deficiency anemia, unspecified: Secondary | ICD-10-CM | POA: Diagnosis not present

## 2020-11-23 DIAGNOSIS — N186 End stage renal disease: Secondary | ICD-10-CM | POA: Diagnosis not present

## 2020-11-24 DIAGNOSIS — I272 Pulmonary hypertension, unspecified: Secondary | ICD-10-CM | POA: Diagnosis not present

## 2020-11-24 DIAGNOSIS — R109 Unspecified abdominal pain: Secondary | ICD-10-CM | POA: Diagnosis not present

## 2020-11-25 DIAGNOSIS — N186 End stage renal disease: Secondary | ICD-10-CM | POA: Diagnosis not present

## 2020-11-25 DIAGNOSIS — I9589 Other hypotension: Secondary | ICD-10-CM | POA: Diagnosis not present

## 2020-11-25 DIAGNOSIS — D689 Coagulation defect, unspecified: Secondary | ICD-10-CM | POA: Diagnosis not present

## 2020-11-25 DIAGNOSIS — N2581 Secondary hyperparathyroidism of renal origin: Secondary | ICD-10-CM | POA: Diagnosis not present

## 2020-11-25 DIAGNOSIS — D631 Anemia in chronic kidney disease: Secondary | ICD-10-CM | POA: Diagnosis not present

## 2020-11-25 DIAGNOSIS — D509 Iron deficiency anemia, unspecified: Secondary | ICD-10-CM | POA: Diagnosis not present

## 2020-11-25 DIAGNOSIS — Z992 Dependence on renal dialysis: Secondary | ICD-10-CM | POA: Diagnosis not present

## 2020-11-27 DIAGNOSIS — Z992 Dependence on renal dialysis: Secondary | ICD-10-CM | POA: Diagnosis not present

## 2020-11-27 DIAGNOSIS — N2581 Secondary hyperparathyroidism of renal origin: Secondary | ICD-10-CM | POA: Diagnosis not present

## 2020-11-27 DIAGNOSIS — N186 End stage renal disease: Secondary | ICD-10-CM | POA: Diagnosis not present

## 2020-11-27 DIAGNOSIS — D631 Anemia in chronic kidney disease: Secondary | ICD-10-CM | POA: Diagnosis not present

## 2020-11-27 DIAGNOSIS — I9589 Other hypotension: Secondary | ICD-10-CM | POA: Diagnosis not present

## 2020-11-27 DIAGNOSIS — D509 Iron deficiency anemia, unspecified: Secondary | ICD-10-CM | POA: Diagnosis not present

## 2020-11-27 DIAGNOSIS — D689 Coagulation defect, unspecified: Secondary | ICD-10-CM | POA: Diagnosis not present

## 2020-11-29 DIAGNOSIS — D689 Coagulation defect, unspecified: Secondary | ICD-10-CM | POA: Diagnosis not present

## 2020-11-29 DIAGNOSIS — N186 End stage renal disease: Secondary | ICD-10-CM | POA: Diagnosis not present

## 2020-11-29 DIAGNOSIS — D631 Anemia in chronic kidney disease: Secondary | ICD-10-CM | POA: Diagnosis not present

## 2020-11-29 DIAGNOSIS — I9589 Other hypotension: Secondary | ICD-10-CM | POA: Diagnosis not present

## 2020-11-29 DIAGNOSIS — N2581 Secondary hyperparathyroidism of renal origin: Secondary | ICD-10-CM | POA: Diagnosis not present

## 2020-11-29 DIAGNOSIS — D509 Iron deficiency anemia, unspecified: Secondary | ICD-10-CM | POA: Diagnosis not present

## 2020-11-29 DIAGNOSIS — Z992 Dependence on renal dialysis: Secondary | ICD-10-CM | POA: Diagnosis not present

## 2020-11-30 DIAGNOSIS — D631 Anemia in chronic kidney disease: Secondary | ICD-10-CM | POA: Diagnosis not present

## 2020-11-30 DIAGNOSIS — N2581 Secondary hyperparathyroidism of renal origin: Secondary | ICD-10-CM | POA: Diagnosis not present

## 2020-11-30 DIAGNOSIS — N186 End stage renal disease: Secondary | ICD-10-CM | POA: Diagnosis not present

## 2020-11-30 DIAGNOSIS — D689 Coagulation defect, unspecified: Secondary | ICD-10-CM | POA: Diagnosis not present

## 2020-11-30 DIAGNOSIS — Z992 Dependence on renal dialysis: Secondary | ICD-10-CM | POA: Diagnosis not present

## 2020-11-30 DIAGNOSIS — D509 Iron deficiency anemia, unspecified: Secondary | ICD-10-CM | POA: Diagnosis not present

## 2020-11-30 DIAGNOSIS — I9589 Other hypotension: Secondary | ICD-10-CM | POA: Diagnosis not present

## 2020-12-02 DIAGNOSIS — D509 Iron deficiency anemia, unspecified: Secondary | ICD-10-CM | POA: Diagnosis not present

## 2020-12-02 DIAGNOSIS — Z992 Dependence on renal dialysis: Secondary | ICD-10-CM | POA: Diagnosis not present

## 2020-12-02 DIAGNOSIS — N2581 Secondary hyperparathyroidism of renal origin: Secondary | ICD-10-CM | POA: Diagnosis not present

## 2020-12-02 DIAGNOSIS — I9589 Other hypotension: Secondary | ICD-10-CM | POA: Diagnosis not present

## 2020-12-02 DIAGNOSIS — D631 Anemia in chronic kidney disease: Secondary | ICD-10-CM | POA: Diagnosis not present

## 2020-12-02 DIAGNOSIS — D689 Coagulation defect, unspecified: Secondary | ICD-10-CM | POA: Diagnosis not present

## 2020-12-02 DIAGNOSIS — N186 End stage renal disease: Secondary | ICD-10-CM | POA: Diagnosis not present

## 2020-12-04 DIAGNOSIS — N186 End stage renal disease: Secondary | ICD-10-CM | POA: Diagnosis not present

## 2020-12-04 DIAGNOSIS — D509 Iron deficiency anemia, unspecified: Secondary | ICD-10-CM | POA: Diagnosis not present

## 2020-12-04 DIAGNOSIS — I9589 Other hypotension: Secondary | ICD-10-CM | POA: Diagnosis not present

## 2020-12-04 DIAGNOSIS — N2581 Secondary hyperparathyroidism of renal origin: Secondary | ICD-10-CM | POA: Diagnosis not present

## 2020-12-04 DIAGNOSIS — D631 Anemia in chronic kidney disease: Secondary | ICD-10-CM | POA: Diagnosis not present

## 2020-12-04 DIAGNOSIS — Z992 Dependence on renal dialysis: Secondary | ICD-10-CM | POA: Diagnosis not present

## 2020-12-04 DIAGNOSIS — D689 Coagulation defect, unspecified: Secondary | ICD-10-CM | POA: Diagnosis not present

## 2020-12-07 DIAGNOSIS — D631 Anemia in chronic kidney disease: Secondary | ICD-10-CM | POA: Diagnosis not present

## 2020-12-07 DIAGNOSIS — D689 Coagulation defect, unspecified: Secondary | ICD-10-CM | POA: Diagnosis not present

## 2020-12-07 DIAGNOSIS — Z992 Dependence on renal dialysis: Secondary | ICD-10-CM | POA: Diagnosis not present

## 2020-12-07 DIAGNOSIS — N186 End stage renal disease: Secondary | ICD-10-CM | POA: Diagnosis not present

## 2020-12-07 DIAGNOSIS — N2581 Secondary hyperparathyroidism of renal origin: Secondary | ICD-10-CM | POA: Diagnosis not present

## 2020-12-07 DIAGNOSIS — I9589 Other hypotension: Secondary | ICD-10-CM | POA: Diagnosis not present

## 2020-12-07 DIAGNOSIS — D509 Iron deficiency anemia, unspecified: Secondary | ICD-10-CM | POA: Diagnosis not present

## 2020-12-09 DIAGNOSIS — D631 Anemia in chronic kidney disease: Secondary | ICD-10-CM | POA: Diagnosis not present

## 2020-12-09 DIAGNOSIS — N2581 Secondary hyperparathyroidism of renal origin: Secondary | ICD-10-CM | POA: Diagnosis not present

## 2020-12-09 DIAGNOSIS — D509 Iron deficiency anemia, unspecified: Secondary | ICD-10-CM | POA: Diagnosis not present

## 2020-12-09 DIAGNOSIS — Z992 Dependence on renal dialysis: Secondary | ICD-10-CM | POA: Diagnosis not present

## 2020-12-09 DIAGNOSIS — N186 End stage renal disease: Secondary | ICD-10-CM | POA: Diagnosis not present

## 2020-12-09 DIAGNOSIS — I9589 Other hypotension: Secondary | ICD-10-CM | POA: Diagnosis not present

## 2020-12-09 DIAGNOSIS — D689 Coagulation defect, unspecified: Secondary | ICD-10-CM | POA: Diagnosis not present

## 2020-12-11 DIAGNOSIS — Z992 Dependence on renal dialysis: Secondary | ICD-10-CM | POA: Diagnosis not present

## 2020-12-11 DIAGNOSIS — N186 End stage renal disease: Secondary | ICD-10-CM | POA: Diagnosis not present

## 2020-12-11 DIAGNOSIS — N2581 Secondary hyperparathyroidism of renal origin: Secondary | ICD-10-CM | POA: Diagnosis not present

## 2020-12-11 DIAGNOSIS — D509 Iron deficiency anemia, unspecified: Secondary | ICD-10-CM | POA: Diagnosis not present

## 2020-12-11 DIAGNOSIS — D689 Coagulation defect, unspecified: Secondary | ICD-10-CM | POA: Diagnosis not present

## 2020-12-11 DIAGNOSIS — D631 Anemia in chronic kidney disease: Secondary | ICD-10-CM | POA: Diagnosis not present

## 2020-12-11 DIAGNOSIS — I9589 Other hypotension: Secondary | ICD-10-CM | POA: Diagnosis not present

## 2020-12-14 DIAGNOSIS — D631 Anemia in chronic kidney disease: Secondary | ICD-10-CM | POA: Diagnosis not present

## 2020-12-14 DIAGNOSIS — I9589 Other hypotension: Secondary | ICD-10-CM | POA: Diagnosis not present

## 2020-12-14 DIAGNOSIS — D509 Iron deficiency anemia, unspecified: Secondary | ICD-10-CM | POA: Diagnosis not present

## 2020-12-14 DIAGNOSIS — Z992 Dependence on renal dialysis: Secondary | ICD-10-CM | POA: Diagnosis not present

## 2020-12-14 DIAGNOSIS — D689 Coagulation defect, unspecified: Secondary | ICD-10-CM | POA: Diagnosis not present

## 2020-12-14 DIAGNOSIS — N186 End stage renal disease: Secondary | ICD-10-CM | POA: Diagnosis not present

## 2020-12-14 DIAGNOSIS — N2581 Secondary hyperparathyroidism of renal origin: Secondary | ICD-10-CM | POA: Diagnosis not present

## 2020-12-16 DIAGNOSIS — D649 Anemia, unspecified: Secondary | ICD-10-CM | POA: Diagnosis not present

## 2020-12-16 DIAGNOSIS — I9589 Other hypotension: Secondary | ICD-10-CM | POA: Diagnosis not present

## 2020-12-16 DIAGNOSIS — I7300 Raynaud's syndrome without gangrene: Secondary | ICD-10-CM | POA: Diagnosis not present

## 2020-12-16 DIAGNOSIS — M3219 Other organ or system involvement in systemic lupus erythematosus: Secondary | ICD-10-CM | POA: Diagnosis not present

## 2020-12-16 DIAGNOSIS — D689 Coagulation defect, unspecified: Secondary | ICD-10-CM | POA: Diagnosis not present

## 2020-12-16 DIAGNOSIS — D708 Other neutropenia: Secondary | ICD-10-CM | POA: Diagnosis not present

## 2020-12-16 DIAGNOSIS — N2581 Secondary hyperparathyroidism of renal origin: Secondary | ICD-10-CM | POA: Diagnosis not present

## 2020-12-16 DIAGNOSIS — D509 Iron deficiency anemia, unspecified: Secondary | ICD-10-CM | POA: Diagnosis not present

## 2020-12-16 DIAGNOSIS — Z992 Dependence on renal dialysis: Secondary | ICD-10-CM | POA: Diagnosis not present

## 2020-12-16 DIAGNOSIS — Z79899 Other long term (current) drug therapy: Secondary | ICD-10-CM | POA: Diagnosis not present

## 2020-12-16 DIAGNOSIS — D631 Anemia in chronic kidney disease: Secondary | ICD-10-CM | POA: Diagnosis not present

## 2020-12-16 DIAGNOSIS — D72810 Lymphocytopenia: Secondary | ICD-10-CM | POA: Diagnosis not present

## 2020-12-16 DIAGNOSIS — Z7952 Long term (current) use of systemic steroids: Secondary | ICD-10-CM | POA: Diagnosis not present

## 2020-12-16 DIAGNOSIS — N186 End stage renal disease: Secondary | ICD-10-CM | POA: Diagnosis not present

## 2020-12-17 DIAGNOSIS — I272 Pulmonary hypertension, unspecified: Secondary | ICD-10-CM | POA: Diagnosis not present

## 2020-12-17 DIAGNOSIS — N19 Unspecified kidney failure: Secondary | ICD-10-CM | POA: Diagnosis not present

## 2020-12-17 DIAGNOSIS — M3219 Other organ or system involvement in systemic lupus erythematosus: Secondary | ICD-10-CM | POA: Diagnosis not present

## 2020-12-18 DIAGNOSIS — N186 End stage renal disease: Secondary | ICD-10-CM | POA: Diagnosis not present

## 2020-12-18 DIAGNOSIS — I9589 Other hypotension: Secondary | ICD-10-CM | POA: Diagnosis not present

## 2020-12-18 DIAGNOSIS — D631 Anemia in chronic kidney disease: Secondary | ICD-10-CM | POA: Diagnosis not present

## 2020-12-18 DIAGNOSIS — Z992 Dependence on renal dialysis: Secondary | ICD-10-CM | POA: Diagnosis not present

## 2020-12-18 DIAGNOSIS — D689 Coagulation defect, unspecified: Secondary | ICD-10-CM | POA: Diagnosis not present

## 2020-12-18 DIAGNOSIS — D509 Iron deficiency anemia, unspecified: Secondary | ICD-10-CM | POA: Diagnosis not present

## 2020-12-18 DIAGNOSIS — N2581 Secondary hyperparathyroidism of renal origin: Secondary | ICD-10-CM | POA: Diagnosis not present

## 2020-12-19 DIAGNOSIS — M3219 Other organ or system involvement in systemic lupus erythematosus: Secondary | ICD-10-CM | POA: Diagnosis not present

## 2020-12-19 DIAGNOSIS — I509 Heart failure, unspecified: Secondary | ICD-10-CM | POA: Diagnosis not present

## 2020-12-19 DIAGNOSIS — I272 Pulmonary hypertension, unspecified: Secondary | ICD-10-CM | POA: Diagnosis not present

## 2020-12-20 DIAGNOSIS — Z992 Dependence on renal dialysis: Secondary | ICD-10-CM | POA: Diagnosis not present

## 2020-12-20 DIAGNOSIS — N186 End stage renal disease: Secondary | ICD-10-CM | POA: Diagnosis not present

## 2020-12-21 DIAGNOSIS — D631 Anemia in chronic kidney disease: Secondary | ICD-10-CM | POA: Diagnosis not present

## 2020-12-21 DIAGNOSIS — D689 Coagulation defect, unspecified: Secondary | ICD-10-CM | POA: Diagnosis not present

## 2020-12-21 DIAGNOSIS — N186 End stage renal disease: Secondary | ICD-10-CM | POA: Diagnosis not present

## 2020-12-21 DIAGNOSIS — Z992 Dependence on renal dialysis: Secondary | ICD-10-CM | POA: Diagnosis not present

## 2020-12-21 DIAGNOSIS — I9589 Other hypotension: Secondary | ICD-10-CM | POA: Diagnosis not present

## 2020-12-21 DIAGNOSIS — N2581 Secondary hyperparathyroidism of renal origin: Secondary | ICD-10-CM | POA: Diagnosis not present

## 2020-12-22 DIAGNOSIS — M47817 Spondylosis without myelopathy or radiculopathy, lumbosacral region: Secondary | ICD-10-CM | POA: Diagnosis not present

## 2020-12-22 DIAGNOSIS — M5137 Other intervertebral disc degeneration, lumbosacral region: Secondary | ICD-10-CM | POA: Diagnosis not present

## 2020-12-23 DIAGNOSIS — N2581 Secondary hyperparathyroidism of renal origin: Secondary | ICD-10-CM | POA: Diagnosis not present

## 2020-12-23 DIAGNOSIS — I9589 Other hypotension: Secondary | ICD-10-CM | POA: Diagnosis not present

## 2020-12-23 DIAGNOSIS — Z23 Encounter for immunization: Secondary | ICD-10-CM | POA: Diagnosis not present

## 2020-12-23 DIAGNOSIS — D689 Coagulation defect, unspecified: Secondary | ICD-10-CM | POA: Diagnosis not present

## 2020-12-23 DIAGNOSIS — Z992 Dependence on renal dialysis: Secondary | ICD-10-CM | POA: Diagnosis not present

## 2020-12-23 DIAGNOSIS — N186 End stage renal disease: Secondary | ICD-10-CM | POA: Diagnosis not present

## 2020-12-23 DIAGNOSIS — D631 Anemia in chronic kidney disease: Secondary | ICD-10-CM | POA: Diagnosis not present

## 2020-12-24 DIAGNOSIS — M47817 Spondylosis without myelopathy or radiculopathy, lumbosacral region: Secondary | ICD-10-CM | POA: Diagnosis not present

## 2020-12-24 DIAGNOSIS — M5137 Other intervertebral disc degeneration, lumbosacral region: Secondary | ICD-10-CM | POA: Diagnosis not present

## 2020-12-25 DIAGNOSIS — N186 End stage renal disease: Secondary | ICD-10-CM | POA: Diagnosis not present

## 2020-12-25 DIAGNOSIS — Z992 Dependence on renal dialysis: Secondary | ICD-10-CM | POA: Diagnosis not present

## 2020-12-25 DIAGNOSIS — I9589 Other hypotension: Secondary | ICD-10-CM | POA: Diagnosis not present

## 2020-12-25 DIAGNOSIS — D689 Coagulation defect, unspecified: Secondary | ICD-10-CM | POA: Diagnosis not present

## 2020-12-25 DIAGNOSIS — N2581 Secondary hyperparathyroidism of renal origin: Secondary | ICD-10-CM | POA: Diagnosis not present

## 2020-12-25 DIAGNOSIS — D631 Anemia in chronic kidney disease: Secondary | ICD-10-CM | POA: Diagnosis not present

## 2020-12-28 DIAGNOSIS — D631 Anemia in chronic kidney disease: Secondary | ICD-10-CM | POA: Diagnosis not present

## 2020-12-28 DIAGNOSIS — I9589 Other hypotension: Secondary | ICD-10-CM | POA: Diagnosis not present

## 2020-12-28 DIAGNOSIS — Z992 Dependence on renal dialysis: Secondary | ICD-10-CM | POA: Diagnosis not present

## 2020-12-28 DIAGNOSIS — N2581 Secondary hyperparathyroidism of renal origin: Secondary | ICD-10-CM | POA: Diagnosis not present

## 2020-12-28 DIAGNOSIS — D689 Coagulation defect, unspecified: Secondary | ICD-10-CM | POA: Diagnosis not present

## 2020-12-28 DIAGNOSIS — N186 End stage renal disease: Secondary | ICD-10-CM | POA: Diagnosis not present

## 2020-12-28 DIAGNOSIS — D509 Iron deficiency anemia, unspecified: Secondary | ICD-10-CM | POA: Diagnosis not present

## 2020-12-30 DIAGNOSIS — N2581 Secondary hyperparathyroidism of renal origin: Secondary | ICD-10-CM | POA: Diagnosis not present

## 2020-12-30 DIAGNOSIS — Z992 Dependence on renal dialysis: Secondary | ICD-10-CM | POA: Diagnosis not present

## 2020-12-30 DIAGNOSIS — N186 End stage renal disease: Secondary | ICD-10-CM | POA: Diagnosis not present

## 2020-12-30 DIAGNOSIS — I9589 Other hypotension: Secondary | ICD-10-CM | POA: Diagnosis not present

## 2020-12-30 DIAGNOSIS — D509 Iron deficiency anemia, unspecified: Secondary | ICD-10-CM | POA: Diagnosis not present

## 2020-12-30 DIAGNOSIS — D689 Coagulation defect, unspecified: Secondary | ICD-10-CM | POA: Diagnosis not present

## 2020-12-30 DIAGNOSIS — D631 Anemia in chronic kidney disease: Secondary | ICD-10-CM | POA: Diagnosis not present

## 2021-01-01 DIAGNOSIS — N186 End stage renal disease: Secondary | ICD-10-CM | POA: Diagnosis not present

## 2021-01-01 DIAGNOSIS — D631 Anemia in chronic kidney disease: Secondary | ICD-10-CM | POA: Diagnosis not present

## 2021-01-01 DIAGNOSIS — N2581 Secondary hyperparathyroidism of renal origin: Secondary | ICD-10-CM | POA: Diagnosis not present

## 2021-01-01 DIAGNOSIS — I9589 Other hypotension: Secondary | ICD-10-CM | POA: Diagnosis not present

## 2021-01-01 DIAGNOSIS — Z992 Dependence on renal dialysis: Secondary | ICD-10-CM | POA: Diagnosis not present

## 2021-01-01 DIAGNOSIS — D689 Coagulation defect, unspecified: Secondary | ICD-10-CM | POA: Diagnosis not present

## 2021-01-01 DIAGNOSIS — D509 Iron deficiency anemia, unspecified: Secondary | ICD-10-CM | POA: Diagnosis not present

## 2021-01-03 DIAGNOSIS — D509 Iron deficiency anemia, unspecified: Secondary | ICD-10-CM | POA: Diagnosis not present

## 2021-01-03 DIAGNOSIS — N2581 Secondary hyperparathyroidism of renal origin: Secondary | ICD-10-CM | POA: Diagnosis not present

## 2021-01-03 DIAGNOSIS — I9589 Other hypotension: Secondary | ICD-10-CM | POA: Diagnosis not present

## 2021-01-03 DIAGNOSIS — N186 End stage renal disease: Secondary | ICD-10-CM | POA: Diagnosis not present

## 2021-01-03 DIAGNOSIS — Z992 Dependence on renal dialysis: Secondary | ICD-10-CM | POA: Diagnosis not present

## 2021-01-03 DIAGNOSIS — D689 Coagulation defect, unspecified: Secondary | ICD-10-CM | POA: Diagnosis not present

## 2021-01-03 DIAGNOSIS — D631 Anemia in chronic kidney disease: Secondary | ICD-10-CM | POA: Diagnosis not present

## 2021-01-04 DIAGNOSIS — D689 Coagulation defect, unspecified: Secondary | ICD-10-CM | POA: Diagnosis not present

## 2021-01-04 DIAGNOSIS — N186 End stage renal disease: Secondary | ICD-10-CM | POA: Diagnosis not present

## 2021-01-04 DIAGNOSIS — D509 Iron deficiency anemia, unspecified: Secondary | ICD-10-CM | POA: Diagnosis not present

## 2021-01-04 DIAGNOSIS — I9589 Other hypotension: Secondary | ICD-10-CM | POA: Diagnosis not present

## 2021-01-04 DIAGNOSIS — N2581 Secondary hyperparathyroidism of renal origin: Secondary | ICD-10-CM | POA: Diagnosis not present

## 2021-01-04 DIAGNOSIS — D631 Anemia in chronic kidney disease: Secondary | ICD-10-CM | POA: Diagnosis not present

## 2021-01-04 DIAGNOSIS — Z992 Dependence on renal dialysis: Secondary | ICD-10-CM | POA: Diagnosis not present

## 2021-01-05 DIAGNOSIS — M47817 Spondylosis without myelopathy or radiculopathy, lumbosacral region: Secondary | ICD-10-CM | POA: Diagnosis not present

## 2021-01-05 DIAGNOSIS — M5137 Other intervertebral disc degeneration, lumbosacral region: Secondary | ICD-10-CM | POA: Diagnosis not present

## 2021-01-06 DIAGNOSIS — N186 End stage renal disease: Secondary | ICD-10-CM | POA: Diagnosis not present

## 2021-01-06 DIAGNOSIS — D689 Coagulation defect, unspecified: Secondary | ICD-10-CM | POA: Diagnosis not present

## 2021-01-06 DIAGNOSIS — Z992 Dependence on renal dialysis: Secondary | ICD-10-CM | POA: Diagnosis not present

## 2021-01-06 DIAGNOSIS — D631 Anemia in chronic kidney disease: Secondary | ICD-10-CM | POA: Diagnosis not present

## 2021-01-06 DIAGNOSIS — D509 Iron deficiency anemia, unspecified: Secondary | ICD-10-CM | POA: Diagnosis not present

## 2021-01-06 DIAGNOSIS — N2581 Secondary hyperparathyroidism of renal origin: Secondary | ICD-10-CM | POA: Diagnosis not present

## 2021-01-06 DIAGNOSIS — I9589 Other hypotension: Secondary | ICD-10-CM | POA: Diagnosis not present

## 2021-01-08 DIAGNOSIS — N2581 Secondary hyperparathyroidism of renal origin: Secondary | ICD-10-CM | POA: Diagnosis not present

## 2021-01-08 DIAGNOSIS — I9589 Other hypotension: Secondary | ICD-10-CM | POA: Diagnosis not present

## 2021-01-08 DIAGNOSIS — Z992 Dependence on renal dialysis: Secondary | ICD-10-CM | POA: Diagnosis not present

## 2021-01-08 DIAGNOSIS — D631 Anemia in chronic kidney disease: Secondary | ICD-10-CM | POA: Diagnosis not present

## 2021-01-08 DIAGNOSIS — N186 End stage renal disease: Secondary | ICD-10-CM | POA: Diagnosis not present

## 2021-01-08 DIAGNOSIS — D509 Iron deficiency anemia, unspecified: Secondary | ICD-10-CM | POA: Diagnosis not present

## 2021-01-08 DIAGNOSIS — D689 Coagulation defect, unspecified: Secondary | ICD-10-CM | POA: Diagnosis not present

## 2021-01-11 DIAGNOSIS — D631 Anemia in chronic kidney disease: Secondary | ICD-10-CM | POA: Diagnosis not present

## 2021-01-11 DIAGNOSIS — N186 End stage renal disease: Secondary | ICD-10-CM | POA: Diagnosis not present

## 2021-01-11 DIAGNOSIS — D689 Coagulation defect, unspecified: Secondary | ICD-10-CM | POA: Diagnosis not present

## 2021-01-11 DIAGNOSIS — D509 Iron deficiency anemia, unspecified: Secondary | ICD-10-CM | POA: Diagnosis not present

## 2021-01-11 DIAGNOSIS — N2581 Secondary hyperparathyroidism of renal origin: Secondary | ICD-10-CM | POA: Diagnosis not present

## 2021-01-11 DIAGNOSIS — Z992 Dependence on renal dialysis: Secondary | ICD-10-CM | POA: Diagnosis not present

## 2021-01-11 DIAGNOSIS — I9589 Other hypotension: Secondary | ICD-10-CM | POA: Diagnosis not present

## 2021-01-13 DIAGNOSIS — D689 Coagulation defect, unspecified: Secondary | ICD-10-CM | POA: Diagnosis not present

## 2021-01-13 DIAGNOSIS — D631 Anemia in chronic kidney disease: Secondary | ICD-10-CM | POA: Diagnosis not present

## 2021-01-13 DIAGNOSIS — D509 Iron deficiency anemia, unspecified: Secondary | ICD-10-CM | POA: Diagnosis not present

## 2021-01-13 DIAGNOSIS — I9589 Other hypotension: Secondary | ICD-10-CM | POA: Diagnosis not present

## 2021-01-13 DIAGNOSIS — N186 End stage renal disease: Secondary | ICD-10-CM | POA: Diagnosis not present

## 2021-01-13 DIAGNOSIS — Z992 Dependence on renal dialysis: Secondary | ICD-10-CM | POA: Diagnosis not present

## 2021-01-13 DIAGNOSIS — N2581 Secondary hyperparathyroidism of renal origin: Secondary | ICD-10-CM | POA: Diagnosis not present

## 2021-01-15 DIAGNOSIS — D631 Anemia in chronic kidney disease: Secondary | ICD-10-CM | POA: Diagnosis not present

## 2021-01-15 DIAGNOSIS — Z992 Dependence on renal dialysis: Secondary | ICD-10-CM | POA: Diagnosis not present

## 2021-01-15 DIAGNOSIS — N186 End stage renal disease: Secondary | ICD-10-CM | POA: Diagnosis not present

## 2021-01-15 DIAGNOSIS — I9589 Other hypotension: Secondary | ICD-10-CM | POA: Diagnosis not present

## 2021-01-15 DIAGNOSIS — N2581 Secondary hyperparathyroidism of renal origin: Secondary | ICD-10-CM | POA: Diagnosis not present

## 2021-01-15 DIAGNOSIS — D509 Iron deficiency anemia, unspecified: Secondary | ICD-10-CM | POA: Diagnosis not present

## 2021-01-15 DIAGNOSIS — D689 Coagulation defect, unspecified: Secondary | ICD-10-CM | POA: Diagnosis not present

## 2021-01-16 DIAGNOSIS — I509 Heart failure, unspecified: Secondary | ICD-10-CM | POA: Diagnosis not present

## 2021-01-16 DIAGNOSIS — I272 Pulmonary hypertension, unspecified: Secondary | ICD-10-CM | POA: Diagnosis not present

## 2021-01-16 DIAGNOSIS — M3219 Other organ or system involvement in systemic lupus erythematosus: Secondary | ICD-10-CM | POA: Diagnosis not present

## 2021-01-18 DIAGNOSIS — D509 Iron deficiency anemia, unspecified: Secondary | ICD-10-CM | POA: Diagnosis not present

## 2021-01-18 DIAGNOSIS — N2581 Secondary hyperparathyroidism of renal origin: Secondary | ICD-10-CM | POA: Diagnosis not present

## 2021-01-18 DIAGNOSIS — N186 End stage renal disease: Secondary | ICD-10-CM | POA: Diagnosis not present

## 2021-01-18 DIAGNOSIS — D631 Anemia in chronic kidney disease: Secondary | ICD-10-CM | POA: Diagnosis not present

## 2021-01-18 DIAGNOSIS — D689 Coagulation defect, unspecified: Secondary | ICD-10-CM | POA: Diagnosis not present

## 2021-01-18 DIAGNOSIS — I9589 Other hypotension: Secondary | ICD-10-CM | POA: Diagnosis not present

## 2021-01-18 DIAGNOSIS — Z992 Dependence on renal dialysis: Secondary | ICD-10-CM | POA: Diagnosis not present

## 2021-01-18 IMAGING — DX DG CHEST 1V PORT
1 series · 1 of 1 positions shown · non-contrast
Comparison: 04/08/2019

CLINICAL DATA: End stage renal disease

EXAM:
PORTABLE CHEST 1 VIEW

[chest ap]
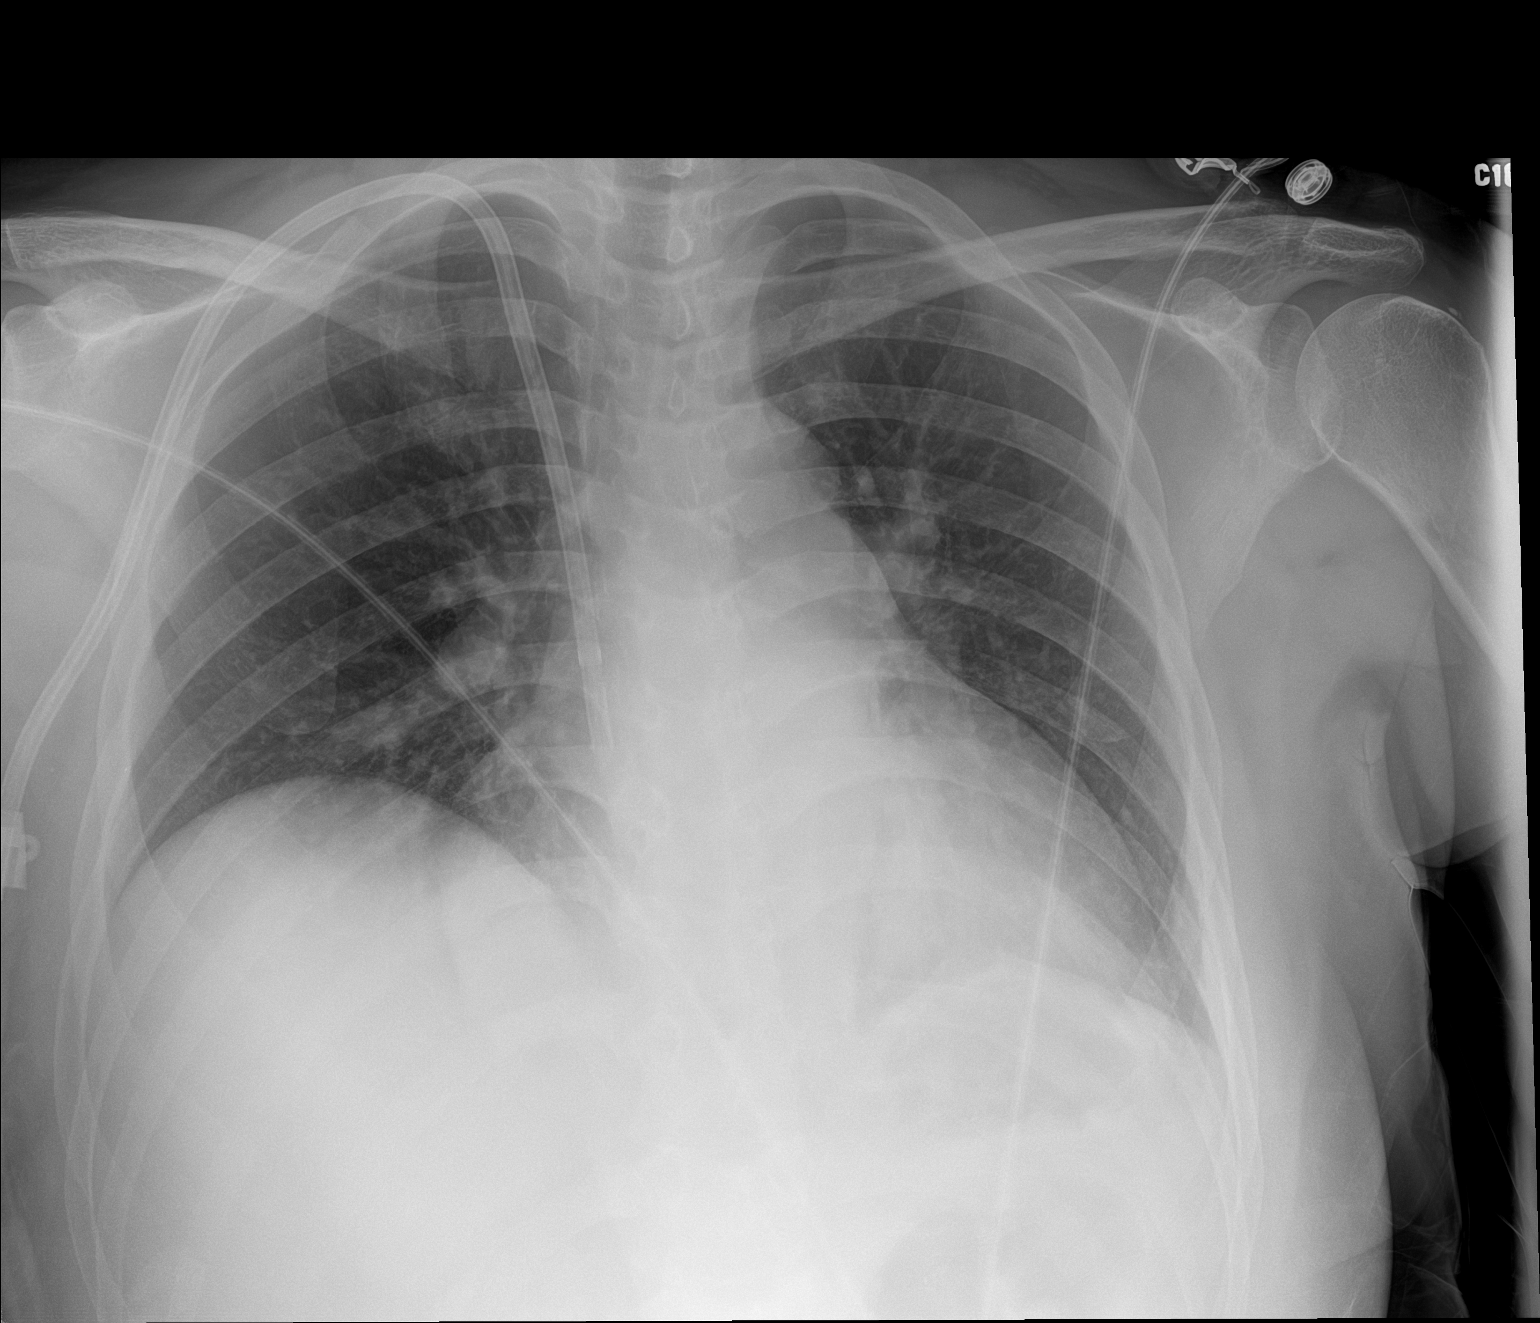

[1 of 1 positions shown; findings below may reference images not displayed]

FINDINGS: Right-sided central venous catheter tip over the cavoatrial region.
No pneumothorax. Cardiomegaly with mild central congestion. No
pleural effusion
IMPRESSION: 1. Right-sided central venous catheter with tip over the cavoatrial
region.
2. Cardiomegaly with slight central congestion.

## 2021-01-20 DIAGNOSIS — D689 Coagulation defect, unspecified: Secondary | ICD-10-CM | POA: Diagnosis not present

## 2021-01-20 DIAGNOSIS — N2581 Secondary hyperparathyroidism of renal origin: Secondary | ICD-10-CM | POA: Diagnosis not present

## 2021-01-20 DIAGNOSIS — D509 Iron deficiency anemia, unspecified: Secondary | ICD-10-CM | POA: Diagnosis not present

## 2021-01-20 DIAGNOSIS — Z992 Dependence on renal dialysis: Secondary | ICD-10-CM | POA: Diagnosis not present

## 2021-01-20 DIAGNOSIS — D631 Anemia in chronic kidney disease: Secondary | ICD-10-CM | POA: Diagnosis not present

## 2021-01-20 DIAGNOSIS — N186 End stage renal disease: Secondary | ICD-10-CM | POA: Diagnosis not present

## 2021-01-20 DIAGNOSIS — I9589 Other hypotension: Secondary | ICD-10-CM | POA: Diagnosis not present

## 2021-01-21 DIAGNOSIS — Z992 Dependence on renal dialysis: Secondary | ICD-10-CM | POA: Diagnosis not present

## 2021-01-21 DIAGNOSIS — N186 End stage renal disease: Secondary | ICD-10-CM | POA: Diagnosis not present

## 2021-01-21 DIAGNOSIS — I272 Pulmonary hypertension, unspecified: Secondary | ICD-10-CM | POA: Diagnosis not present

## 2021-01-21 DIAGNOSIS — M329 Systemic lupus erythematosus, unspecified: Secondary | ICD-10-CM | POA: Diagnosis not present

## 2021-01-22 DIAGNOSIS — D689 Coagulation defect, unspecified: Secondary | ICD-10-CM | POA: Diagnosis not present

## 2021-01-22 DIAGNOSIS — D631 Anemia in chronic kidney disease: Secondary | ICD-10-CM | POA: Diagnosis not present

## 2021-01-22 DIAGNOSIS — D509 Iron deficiency anemia, unspecified: Secondary | ICD-10-CM | POA: Diagnosis not present

## 2021-01-22 DIAGNOSIS — N2581 Secondary hyperparathyroidism of renal origin: Secondary | ICD-10-CM | POA: Diagnosis not present

## 2021-01-22 DIAGNOSIS — Z992 Dependence on renal dialysis: Secondary | ICD-10-CM | POA: Diagnosis not present

## 2021-01-22 DIAGNOSIS — N186 End stage renal disease: Secondary | ICD-10-CM | POA: Diagnosis not present

## 2021-01-25 DIAGNOSIS — N2581 Secondary hyperparathyroidism of renal origin: Secondary | ICD-10-CM | POA: Diagnosis not present

## 2021-01-25 DIAGNOSIS — D689 Coagulation defect, unspecified: Secondary | ICD-10-CM | POA: Diagnosis not present

## 2021-01-25 DIAGNOSIS — Z992 Dependence on renal dialysis: Secondary | ICD-10-CM | POA: Diagnosis not present

## 2021-01-25 DIAGNOSIS — D631 Anemia in chronic kidney disease: Secondary | ICD-10-CM | POA: Diagnosis not present

## 2021-01-25 DIAGNOSIS — N186 End stage renal disease: Secondary | ICD-10-CM | POA: Diagnosis not present

## 2021-01-25 DIAGNOSIS — D509 Iron deficiency anemia, unspecified: Secondary | ICD-10-CM | POA: Diagnosis not present

## 2021-01-27 DIAGNOSIS — Z992 Dependence on renal dialysis: Secondary | ICD-10-CM | POA: Diagnosis not present

## 2021-01-27 DIAGNOSIS — D689 Coagulation defect, unspecified: Secondary | ICD-10-CM | POA: Diagnosis not present

## 2021-01-27 DIAGNOSIS — N186 End stage renal disease: Secondary | ICD-10-CM | POA: Diagnosis not present

## 2021-01-27 DIAGNOSIS — D509 Iron deficiency anemia, unspecified: Secondary | ICD-10-CM | POA: Diagnosis not present

## 2021-01-27 DIAGNOSIS — D631 Anemia in chronic kidney disease: Secondary | ICD-10-CM | POA: Diagnosis not present

## 2021-01-27 DIAGNOSIS — N2581 Secondary hyperparathyroidism of renal origin: Secondary | ICD-10-CM | POA: Diagnosis not present

## 2021-01-29 DIAGNOSIS — D689 Coagulation defect, unspecified: Secondary | ICD-10-CM | POA: Diagnosis not present

## 2021-01-29 DIAGNOSIS — D631 Anemia in chronic kidney disease: Secondary | ICD-10-CM | POA: Diagnosis not present

## 2021-01-29 DIAGNOSIS — N186 End stage renal disease: Secondary | ICD-10-CM | POA: Diagnosis not present

## 2021-01-29 DIAGNOSIS — D509 Iron deficiency anemia, unspecified: Secondary | ICD-10-CM | POA: Diagnosis not present

## 2021-01-29 DIAGNOSIS — Z992 Dependence on renal dialysis: Secondary | ICD-10-CM | POA: Diagnosis not present

## 2021-01-29 DIAGNOSIS — N2581 Secondary hyperparathyroidism of renal origin: Secondary | ICD-10-CM | POA: Diagnosis not present

## 2021-01-31 DIAGNOSIS — D631 Anemia in chronic kidney disease: Secondary | ICD-10-CM | POA: Diagnosis not present

## 2021-01-31 DIAGNOSIS — I120 Hypertensive chronic kidney disease with stage 5 chronic kidney disease or end stage renal disease: Secondary | ICD-10-CM | POA: Diagnosis not present

## 2021-01-31 DIAGNOSIS — I871 Compression of vein: Secondary | ICD-10-CM | POA: Diagnosis not present

## 2021-01-31 DIAGNOSIS — Z992 Dependence on renal dialysis: Secondary | ICD-10-CM | POA: Diagnosis not present

## 2021-01-31 DIAGNOSIS — T82898A Other specified complication of vascular prosthetic devices, implants and grafts, initial encounter: Secondary | ICD-10-CM | POA: Diagnosis not present

## 2021-01-31 DIAGNOSIS — N186 End stage renal disease: Secondary | ICD-10-CM | POA: Diagnosis not present

## 2021-01-31 DIAGNOSIS — K219 Gastro-esophageal reflux disease without esophagitis: Secondary | ICD-10-CM | POA: Diagnosis not present

## 2021-01-31 DIAGNOSIS — D649 Anemia, unspecified: Secondary | ICD-10-CM | POA: Diagnosis not present

## 2021-01-31 DIAGNOSIS — T82858A Stenosis of vascular prosthetic devices, implants and grafts, initial encounter: Secondary | ICD-10-CM | POA: Diagnosis not present

## 2021-02-01 DIAGNOSIS — N186 End stage renal disease: Secondary | ICD-10-CM | POA: Diagnosis not present

## 2021-02-01 DIAGNOSIS — N2581 Secondary hyperparathyroidism of renal origin: Secondary | ICD-10-CM | POA: Diagnosis not present

## 2021-02-01 DIAGNOSIS — D509 Iron deficiency anemia, unspecified: Secondary | ICD-10-CM | POA: Diagnosis not present

## 2021-02-01 DIAGNOSIS — Z992 Dependence on renal dialysis: Secondary | ICD-10-CM | POA: Diagnosis not present

## 2021-02-01 DIAGNOSIS — D689 Coagulation defect, unspecified: Secondary | ICD-10-CM | POA: Diagnosis not present

## 2021-02-01 DIAGNOSIS — D631 Anemia in chronic kidney disease: Secondary | ICD-10-CM | POA: Diagnosis not present

## 2021-02-03 DIAGNOSIS — D631 Anemia in chronic kidney disease: Secondary | ICD-10-CM | POA: Diagnosis not present

## 2021-02-03 DIAGNOSIS — D509 Iron deficiency anemia, unspecified: Secondary | ICD-10-CM | POA: Diagnosis not present

## 2021-02-03 DIAGNOSIS — Z992 Dependence on renal dialysis: Secondary | ICD-10-CM | POA: Diagnosis not present

## 2021-02-03 DIAGNOSIS — D689 Coagulation defect, unspecified: Secondary | ICD-10-CM | POA: Diagnosis not present

## 2021-02-03 DIAGNOSIS — N2581 Secondary hyperparathyroidism of renal origin: Secondary | ICD-10-CM | POA: Diagnosis not present

## 2021-02-03 DIAGNOSIS — N186 End stage renal disease: Secondary | ICD-10-CM | POA: Diagnosis not present

## 2021-02-05 DIAGNOSIS — D509 Iron deficiency anemia, unspecified: Secondary | ICD-10-CM | POA: Diagnosis not present

## 2021-02-05 DIAGNOSIS — D631 Anemia in chronic kidney disease: Secondary | ICD-10-CM | POA: Diagnosis not present

## 2021-02-05 DIAGNOSIS — Z992 Dependence on renal dialysis: Secondary | ICD-10-CM | POA: Diagnosis not present

## 2021-02-05 DIAGNOSIS — N186 End stage renal disease: Secondary | ICD-10-CM | POA: Diagnosis not present

## 2021-02-05 DIAGNOSIS — D689 Coagulation defect, unspecified: Secondary | ICD-10-CM | POA: Diagnosis not present

## 2021-02-05 DIAGNOSIS — N2581 Secondary hyperparathyroidism of renal origin: Secondary | ICD-10-CM | POA: Diagnosis not present

## 2021-02-07 DIAGNOSIS — Z992 Dependence on renal dialysis: Secondary | ICD-10-CM | POA: Diagnosis not present

## 2021-02-07 DIAGNOSIS — D689 Coagulation defect, unspecified: Secondary | ICD-10-CM | POA: Diagnosis not present

## 2021-02-07 DIAGNOSIS — N186 End stage renal disease: Secondary | ICD-10-CM | POA: Diagnosis not present

## 2021-02-07 DIAGNOSIS — D509 Iron deficiency anemia, unspecified: Secondary | ICD-10-CM | POA: Diagnosis not present

## 2021-02-07 DIAGNOSIS — N2581 Secondary hyperparathyroidism of renal origin: Secondary | ICD-10-CM | POA: Diagnosis not present

## 2021-02-07 DIAGNOSIS — D631 Anemia in chronic kidney disease: Secondary | ICD-10-CM | POA: Diagnosis not present

## 2021-02-08 DIAGNOSIS — D509 Iron deficiency anemia, unspecified: Secondary | ICD-10-CM | POA: Diagnosis not present

## 2021-02-08 DIAGNOSIS — N186 End stage renal disease: Secondary | ICD-10-CM | POA: Diagnosis not present

## 2021-02-08 DIAGNOSIS — N2581 Secondary hyperparathyroidism of renal origin: Secondary | ICD-10-CM | POA: Diagnosis not present

## 2021-02-08 DIAGNOSIS — D631 Anemia in chronic kidney disease: Secondary | ICD-10-CM | POA: Diagnosis not present

## 2021-02-08 DIAGNOSIS — Z992 Dependence on renal dialysis: Secondary | ICD-10-CM | POA: Diagnosis not present

## 2021-02-08 DIAGNOSIS — D689 Coagulation defect, unspecified: Secondary | ICD-10-CM | POA: Diagnosis not present

## 2021-02-10 DIAGNOSIS — N2581 Secondary hyperparathyroidism of renal origin: Secondary | ICD-10-CM | POA: Diagnosis not present

## 2021-02-10 DIAGNOSIS — N186 End stage renal disease: Secondary | ICD-10-CM | POA: Diagnosis not present

## 2021-02-10 DIAGNOSIS — D689 Coagulation defect, unspecified: Secondary | ICD-10-CM | POA: Diagnosis not present

## 2021-02-10 DIAGNOSIS — D631 Anemia in chronic kidney disease: Secondary | ICD-10-CM | POA: Diagnosis not present

## 2021-02-10 DIAGNOSIS — Z992 Dependence on renal dialysis: Secondary | ICD-10-CM | POA: Diagnosis not present

## 2021-02-10 DIAGNOSIS — D509 Iron deficiency anemia, unspecified: Secondary | ICD-10-CM | POA: Diagnosis not present

## 2021-02-11 DIAGNOSIS — N2581 Secondary hyperparathyroidism of renal origin: Secondary | ICD-10-CM | POA: Diagnosis not present

## 2021-02-11 DIAGNOSIS — Z992 Dependence on renal dialysis: Secondary | ICD-10-CM | POA: Diagnosis not present

## 2021-02-11 DIAGNOSIS — D631 Anemia in chronic kidney disease: Secondary | ICD-10-CM | POA: Diagnosis not present

## 2021-02-11 DIAGNOSIS — N186 End stage renal disease: Secondary | ICD-10-CM | POA: Diagnosis not present

## 2021-02-11 DIAGNOSIS — D509 Iron deficiency anemia, unspecified: Secondary | ICD-10-CM | POA: Diagnosis not present

## 2021-02-11 DIAGNOSIS — D689 Coagulation defect, unspecified: Secondary | ICD-10-CM | POA: Diagnosis not present

## 2021-02-14 DIAGNOSIS — D631 Anemia in chronic kidney disease: Secondary | ICD-10-CM | POA: Diagnosis not present

## 2021-02-14 DIAGNOSIS — D689 Coagulation defect, unspecified: Secondary | ICD-10-CM | POA: Diagnosis not present

## 2021-02-14 DIAGNOSIS — D509 Iron deficiency anemia, unspecified: Secondary | ICD-10-CM | POA: Diagnosis not present

## 2021-02-14 DIAGNOSIS — N186 End stage renal disease: Secondary | ICD-10-CM | POA: Diagnosis not present

## 2021-02-14 DIAGNOSIS — Z992 Dependence on renal dialysis: Secondary | ICD-10-CM | POA: Diagnosis not present

## 2021-02-14 DIAGNOSIS — N2581 Secondary hyperparathyroidism of renal origin: Secondary | ICD-10-CM | POA: Diagnosis not present

## 2021-02-14 DIAGNOSIS — Z1231 Encounter for screening mammogram for malignant neoplasm of breast: Secondary | ICD-10-CM | POA: Diagnosis not present

## 2021-02-15 DIAGNOSIS — N2581 Secondary hyperparathyroidism of renal origin: Secondary | ICD-10-CM | POA: Diagnosis not present

## 2021-02-15 DIAGNOSIS — Z992 Dependence on renal dialysis: Secondary | ICD-10-CM | POA: Diagnosis not present

## 2021-02-15 DIAGNOSIS — D631 Anemia in chronic kidney disease: Secondary | ICD-10-CM | POA: Diagnosis not present

## 2021-02-15 DIAGNOSIS — N186 End stage renal disease: Secondary | ICD-10-CM | POA: Diagnosis not present

## 2021-02-15 DIAGNOSIS — D509 Iron deficiency anemia, unspecified: Secondary | ICD-10-CM | POA: Diagnosis not present

## 2021-02-15 DIAGNOSIS — D689 Coagulation defect, unspecified: Secondary | ICD-10-CM | POA: Diagnosis not present

## 2021-02-16 DIAGNOSIS — M3219 Other organ or system involvement in systemic lupus erythematosus: Secondary | ICD-10-CM | POA: Diagnosis not present

## 2021-02-16 DIAGNOSIS — I272 Pulmonary hypertension, unspecified: Secondary | ICD-10-CM | POA: Diagnosis not present

## 2021-02-16 DIAGNOSIS — I509 Heart failure, unspecified: Secondary | ICD-10-CM | POA: Diagnosis not present

## 2021-02-17 DIAGNOSIS — D509 Iron deficiency anemia, unspecified: Secondary | ICD-10-CM | POA: Diagnosis not present

## 2021-02-17 DIAGNOSIS — N2581 Secondary hyperparathyroidism of renal origin: Secondary | ICD-10-CM | POA: Diagnosis not present

## 2021-02-17 DIAGNOSIS — D631 Anemia in chronic kidney disease: Secondary | ICD-10-CM | POA: Diagnosis not present

## 2021-02-17 DIAGNOSIS — D689 Coagulation defect, unspecified: Secondary | ICD-10-CM | POA: Diagnosis not present

## 2021-02-17 DIAGNOSIS — Z992 Dependence on renal dialysis: Secondary | ICD-10-CM | POA: Diagnosis not present

## 2021-02-17 DIAGNOSIS — N186 End stage renal disease: Secondary | ICD-10-CM | POA: Diagnosis not present

## 2021-02-18 DIAGNOSIS — D689 Coagulation defect, unspecified: Secondary | ICD-10-CM | POA: Diagnosis not present

## 2021-02-18 DIAGNOSIS — N186 End stage renal disease: Secondary | ICD-10-CM | POA: Diagnosis not present

## 2021-02-18 DIAGNOSIS — D631 Anemia in chronic kidney disease: Secondary | ICD-10-CM | POA: Diagnosis not present

## 2021-02-18 DIAGNOSIS — D509 Iron deficiency anemia, unspecified: Secondary | ICD-10-CM | POA: Diagnosis not present

## 2021-02-18 DIAGNOSIS — N2581 Secondary hyperparathyroidism of renal origin: Secondary | ICD-10-CM | POA: Diagnosis not present

## 2021-02-18 DIAGNOSIS — Z992 Dependence on renal dialysis: Secondary | ICD-10-CM | POA: Diagnosis not present

## 2021-02-19 DIAGNOSIS — Z992 Dependence on renal dialysis: Secondary | ICD-10-CM | POA: Diagnosis not present

## 2021-02-19 DIAGNOSIS — N186 End stage renal disease: Secondary | ICD-10-CM | POA: Diagnosis not present

## 2021-02-19 DIAGNOSIS — I272 Pulmonary hypertension, unspecified: Secondary | ICD-10-CM | POA: Diagnosis not present

## 2021-02-19 DIAGNOSIS — M329 Systemic lupus erythematosus, unspecified: Secondary | ICD-10-CM | POA: Diagnosis not present

## 2021-02-19 DIAGNOSIS — I9589 Other hypotension: Secondary | ICD-10-CM | POA: Diagnosis not present

## 2021-02-20 DIAGNOSIS — Z992 Dependence on renal dialysis: Secondary | ICD-10-CM | POA: Diagnosis not present

## 2021-02-20 DIAGNOSIS — M329 Systemic lupus erythematosus, unspecified: Secondary | ICD-10-CM | POA: Diagnosis not present

## 2021-02-20 DIAGNOSIS — N186 End stage renal disease: Secondary | ICD-10-CM | POA: Diagnosis not present

## 2021-02-21 DIAGNOSIS — D631 Anemia in chronic kidney disease: Secondary | ICD-10-CM | POA: Diagnosis not present

## 2021-02-21 DIAGNOSIS — K219 Gastro-esophageal reflux disease without esophagitis: Secondary | ICD-10-CM | POA: Diagnosis not present

## 2021-02-21 DIAGNOSIS — N186 End stage renal disease: Secondary | ICD-10-CM | POA: Diagnosis not present

## 2021-02-21 DIAGNOSIS — N2581 Secondary hyperparathyroidism of renal origin: Secondary | ICD-10-CM | POA: Diagnosis not present

## 2021-02-21 DIAGNOSIS — R109 Unspecified abdominal pain: Secondary | ICD-10-CM | POA: Diagnosis not present

## 2021-02-21 DIAGNOSIS — Z992 Dependence on renal dialysis: Secondary | ICD-10-CM | POA: Diagnosis not present

## 2021-02-22 DIAGNOSIS — D631 Anemia in chronic kidney disease: Secondary | ICD-10-CM | POA: Diagnosis not present

## 2021-02-22 DIAGNOSIS — N186 End stage renal disease: Secondary | ICD-10-CM | POA: Diagnosis not present

## 2021-02-22 DIAGNOSIS — N2581 Secondary hyperparathyroidism of renal origin: Secondary | ICD-10-CM | POA: Diagnosis not present

## 2021-02-22 DIAGNOSIS — Z992 Dependence on renal dialysis: Secondary | ICD-10-CM | POA: Diagnosis not present

## 2021-02-24 DIAGNOSIS — N2581 Secondary hyperparathyroidism of renal origin: Secondary | ICD-10-CM | POA: Diagnosis not present

## 2021-02-24 DIAGNOSIS — N186 End stage renal disease: Secondary | ICD-10-CM | POA: Diagnosis not present

## 2021-02-24 DIAGNOSIS — Z992 Dependence on renal dialysis: Secondary | ICD-10-CM | POA: Diagnosis not present

## 2021-02-24 DIAGNOSIS — D631 Anemia in chronic kidney disease: Secondary | ICD-10-CM | POA: Diagnosis not present

## 2021-02-25 DIAGNOSIS — Z992 Dependence on renal dialysis: Secondary | ICD-10-CM | POA: Diagnosis not present

## 2021-02-25 DIAGNOSIS — D631 Anemia in chronic kidney disease: Secondary | ICD-10-CM | POA: Diagnosis not present

## 2021-02-25 DIAGNOSIS — N186 End stage renal disease: Secondary | ICD-10-CM | POA: Diagnosis not present

## 2021-02-25 DIAGNOSIS — N2581 Secondary hyperparathyroidism of renal origin: Secondary | ICD-10-CM | POA: Diagnosis not present

## 2021-02-28 DIAGNOSIS — N186 End stage renal disease: Secondary | ICD-10-CM | POA: Diagnosis not present

## 2021-02-28 DIAGNOSIS — N2581 Secondary hyperparathyroidism of renal origin: Secondary | ICD-10-CM | POA: Diagnosis not present

## 2021-02-28 DIAGNOSIS — Z992 Dependence on renal dialysis: Secondary | ICD-10-CM | POA: Diagnosis not present

## 2021-02-28 DIAGNOSIS — D631 Anemia in chronic kidney disease: Secondary | ICD-10-CM | POA: Diagnosis not present

## 2021-03-01 DIAGNOSIS — D631 Anemia in chronic kidney disease: Secondary | ICD-10-CM | POA: Diagnosis not present

## 2021-03-01 DIAGNOSIS — N186 End stage renal disease: Secondary | ICD-10-CM | POA: Diagnosis not present

## 2021-03-01 DIAGNOSIS — Z992 Dependence on renal dialysis: Secondary | ICD-10-CM | POA: Diagnosis not present

## 2021-03-01 DIAGNOSIS — N2581 Secondary hyperparathyroidism of renal origin: Secondary | ICD-10-CM | POA: Diagnosis not present

## 2021-03-02 DIAGNOSIS — N2581 Secondary hyperparathyroidism of renal origin: Secondary | ICD-10-CM | POA: Diagnosis not present

## 2021-03-02 DIAGNOSIS — Z992 Dependence on renal dialysis: Secondary | ICD-10-CM | POA: Diagnosis not present

## 2021-03-02 DIAGNOSIS — D631 Anemia in chronic kidney disease: Secondary | ICD-10-CM | POA: Diagnosis not present

## 2021-03-02 DIAGNOSIS — N186 End stage renal disease: Secondary | ICD-10-CM | POA: Diagnosis not present

## 2021-03-03 DIAGNOSIS — M3214 Glomerular disease in systemic lupus erythematosus: Secondary | ICD-10-CM | POA: Diagnosis not present

## 2021-03-03 DIAGNOSIS — Z7682 Awaiting organ transplant status: Secondary | ICD-10-CM | POA: Diagnosis not present

## 2021-03-03 DIAGNOSIS — R34 Anuria and oliguria: Secondary | ICD-10-CM | POA: Diagnosis not present

## 2021-03-03 DIAGNOSIS — M329 Systemic lupus erythematosus, unspecified: Secondary | ICD-10-CM | POA: Diagnosis not present

## 2021-03-03 DIAGNOSIS — Z01818 Encounter for other preprocedural examination: Secondary | ICD-10-CM | POA: Diagnosis not present

## 2021-03-03 DIAGNOSIS — Z79899 Other long term (current) drug therapy: Secondary | ICD-10-CM | POA: Diagnosis not present

## 2021-03-03 DIAGNOSIS — R918 Other nonspecific abnormal finding of lung field: Secondary | ICD-10-CM | POA: Diagnosis not present

## 2021-03-03 DIAGNOSIS — Z992 Dependence on renal dialysis: Secondary | ICD-10-CM | POA: Diagnosis not present

## 2021-03-03 DIAGNOSIS — Z1159 Encounter for screening for other viral diseases: Secondary | ICD-10-CM | POA: Diagnosis not present

## 2021-03-03 DIAGNOSIS — Z8249 Family history of ischemic heart disease and other diseases of the circulatory system: Secondary | ICD-10-CM | POA: Diagnosis not present

## 2021-03-03 DIAGNOSIS — R252 Cramp and spasm: Secondary | ICD-10-CM | POA: Diagnosis not present

## 2021-03-03 DIAGNOSIS — I959 Hypotension, unspecified: Secondary | ICD-10-CM | POA: Diagnosis not present

## 2021-03-03 DIAGNOSIS — K589 Irritable bowel syndrome without diarrhea: Secondary | ICD-10-CM | POA: Diagnosis not present

## 2021-03-03 DIAGNOSIS — N186 End stage renal disease: Secondary | ICD-10-CM | POA: Diagnosis not present

## 2021-03-03 DIAGNOSIS — I120 Hypertensive chronic kidney disease with stage 5 chronic kidney disease or end stage renal disease: Secondary | ICD-10-CM | POA: Diagnosis not present

## 2021-03-03 DIAGNOSIS — I440 Atrioventricular block, first degree: Secondary | ICD-10-CM | POA: Diagnosis not present

## 2021-03-03 DIAGNOSIS — I4581 Long QT syndrome: Secondary | ICD-10-CM | POA: Diagnosis not present

## 2021-03-03 DIAGNOSIS — R9431 Abnormal electrocardiogram [ECG] [EKG]: Secondary | ICD-10-CM | POA: Diagnosis not present

## 2021-03-03 DIAGNOSIS — I272 Pulmonary hypertension, unspecified: Secondary | ICD-10-CM | POA: Diagnosis not present

## 2021-03-03 DIAGNOSIS — Z86010 Personal history of colonic polyps: Secondary | ICD-10-CM | POA: Diagnosis not present

## 2021-03-03 DIAGNOSIS — Z8619 Personal history of other infectious and parasitic diseases: Secondary | ICD-10-CM | POA: Diagnosis not present

## 2021-03-03 DIAGNOSIS — Z8673 Personal history of transient ischemic attack (TIA), and cerebral infarction without residual deficits: Secondary | ICD-10-CM | POA: Diagnosis not present

## 2021-03-03 DIAGNOSIS — I498 Other specified cardiac arrhythmias: Secondary | ICD-10-CM | POA: Diagnosis not present

## 2021-03-03 DIAGNOSIS — Z7952 Long term (current) use of systemic steroids: Secondary | ICD-10-CM | POA: Diagnosis not present

## 2021-03-03 DIAGNOSIS — Z8269 Family history of other diseases of the musculoskeletal system and connective tissue: Secondary | ICD-10-CM | POA: Diagnosis not present

## 2021-03-03 DIAGNOSIS — D631 Anemia in chronic kidney disease: Secondary | ICD-10-CM | POA: Diagnosis not present

## 2021-03-03 DIAGNOSIS — E213 Hyperparathyroidism, unspecified: Secondary | ICD-10-CM | POA: Diagnosis not present

## 2021-03-04 DIAGNOSIS — D631 Anemia in chronic kidney disease: Secondary | ICD-10-CM | POA: Diagnosis not present

## 2021-03-04 DIAGNOSIS — N2581 Secondary hyperparathyroidism of renal origin: Secondary | ICD-10-CM | POA: Diagnosis not present

## 2021-03-04 DIAGNOSIS — Z992 Dependence on renal dialysis: Secondary | ICD-10-CM | POA: Diagnosis not present

## 2021-03-04 DIAGNOSIS — N186 End stage renal disease: Secondary | ICD-10-CM | POA: Diagnosis not present

## 2021-03-07 DIAGNOSIS — D631 Anemia in chronic kidney disease: Secondary | ICD-10-CM | POA: Diagnosis not present

## 2021-03-07 DIAGNOSIS — Z992 Dependence on renal dialysis: Secondary | ICD-10-CM | POA: Diagnosis not present

## 2021-03-07 DIAGNOSIS — N186 End stage renal disease: Secondary | ICD-10-CM | POA: Diagnosis not present

## 2021-03-07 DIAGNOSIS — N2581 Secondary hyperparathyroidism of renal origin: Secondary | ICD-10-CM | POA: Diagnosis not present

## 2021-03-10 DIAGNOSIS — N2581 Secondary hyperparathyroidism of renal origin: Secondary | ICD-10-CM | POA: Diagnosis not present

## 2021-03-10 DIAGNOSIS — D631 Anemia in chronic kidney disease: Secondary | ICD-10-CM | POA: Diagnosis not present

## 2021-03-10 DIAGNOSIS — Z992 Dependence on renal dialysis: Secondary | ICD-10-CM | POA: Diagnosis not present

## 2021-03-10 DIAGNOSIS — N186 End stage renal disease: Secondary | ICD-10-CM | POA: Diagnosis not present

## 2021-03-11 DIAGNOSIS — N2581 Secondary hyperparathyroidism of renal origin: Secondary | ICD-10-CM | POA: Diagnosis not present

## 2021-03-11 DIAGNOSIS — D631 Anemia in chronic kidney disease: Secondary | ICD-10-CM | POA: Diagnosis not present

## 2021-03-11 DIAGNOSIS — Z992 Dependence on renal dialysis: Secondary | ICD-10-CM | POA: Diagnosis not present

## 2021-03-11 DIAGNOSIS — N186 End stage renal disease: Secondary | ICD-10-CM | POA: Diagnosis not present

## 2021-03-14 ENCOUNTER — Ambulatory Visit (INDEPENDENT_AMBULATORY_CARE_PROVIDER_SITE_OTHER): Payer: Medicare Other

## 2021-03-14 DIAGNOSIS — Z992 Dependence on renal dialysis: Secondary | ICD-10-CM | POA: Diagnosis not present

## 2021-03-14 DIAGNOSIS — N2581 Secondary hyperparathyroidism of renal origin: Secondary | ICD-10-CM | POA: Diagnosis not present

## 2021-03-14 DIAGNOSIS — N186 End stage renal disease: Secondary | ICD-10-CM | POA: Diagnosis not present

## 2021-03-14 DIAGNOSIS — Z0000 Encounter for general adult medical examination without abnormal findings: Secondary | ICD-10-CM

## 2021-03-14 DIAGNOSIS — D631 Anemia in chronic kidney disease: Secondary | ICD-10-CM | POA: Diagnosis not present

## 2021-03-14 NOTE — Patient Instructions (Signed)
[name] [name] , Thank you for taking time to come for your Medicare Wellness Visit. I appreciate your ongoing commitment to your health goals. Please review the following plan we discussed and let me know if I can assist you in the future.   Screening recommendations/referrals: Colonoscopy: Done 08/12/18 Mammogram: Pt stated 12/2020 Recommended yearly ophthalmology/optometry visit for glaucoma screening and checkup Recommended yearly dental visit for hygiene and checkup  Vaccinations: Influenza vaccine: Up to date Tdap vaccine: Up to date  Covid-19: Completed 5/27, 6/24, & 12/23/20  Advanced directives: Advance directive discussed with you today. Even though you declined this today please call our office should you change your mind and we can give you the proper paperwork for you to fill out.  Conditions/risks identified: none at this time  Next appointment: Follow up in one year for your annual wellness visit.   Preventive Care 40-64 Years, Female Preventive care refers to lifestyle choices and visits with your health care provider that can promote health and wellness. What does preventive care include?  A yearly physical exam. This is also called an annual well check.  Dental exams once or twice a year.  Routine eye exams. Ask your health care provider how often you should have your eyes checked.  Personal lifestyle choices, including:  Daily care of your teeth and gums.  Regular physical activity.  Eating a healthy diet.  Avoiding tobacco and drug use.  Limiting alcohol use.  Practicing safe sex.  Taking low-dose aspirin daily starting at age 52.  Taking vitamin and mineral supplements as recommended by your health care provider. What happens during an annual well check? The services and screenings done by your health care provider during your annual well check will depend on your age, overall health, lifestyle risk factors, and family history of disease. Counseling   Your health care provider may ask you questions about your:  Alcohol use.  Tobacco use.  Drug use.  Emotional well-being.  Home and relationship well-being.  Sexual activity.  Eating habits.  Work and work Statistician.  Method of birth control.  Menstrual cycle.  Pregnancy history. Screening  You may have the following tests or measurements:  Height, weight, and BMI.  Blood pressure.  Lipid and cholesterol levels. These may be checked every 5 years, or more frequently if you are over 13 years old.  Skin check.  Lung cancer screening. You may have this screening every year starting at age 72 if you have a 30-pack-year history of smoking and currently smoke or have quit within the past 15 years.  Fecal occult blood test (FOBT) of the stool. You may have this test every year starting at age 65.  Flexible sigmoidoscopy or colonoscopy. You may have a sigmoidoscopy every 5 years or a colonoscopy every 10 years starting at age 65.  Hepatitis C blood test.  Hepatitis B blood test.  Sexually transmitted disease (STD) testing.  Diabetes screening. This is done by checking your blood sugar (glucose) after you have not eaten for a while (fasting). You may have this done every 1-3 years.  Mammogram. This may be done every 1-2 years. Talk to your health care provider about when you should start having regular mammograms. This may depend on whether you have a family history of breast cancer.  BRCA-related cancer screening. This may be done if you have a family history of breast, ovarian, tubal, or peritoneal cancers.  Pelvic exam and Pap test. This may be done every 3 years starting at  age 29. Starting at age 88, this may be done every 5 years if you have a Pap test in combination with an HPV test.  Bone density scan. This is done to screen for osteoporosis. You may have this scan if you are at high risk for osteoporosis. Discuss your test results, treatment options, and if  necessary, the need for more tests with your health care provider. Vaccines  Your health care provider may recommend certain vaccines, such as:  Influenza vaccine. This is recommended every year.  Tetanus, diphtheria, and acellular pertussis (Tdap, Td) vaccine. You may need a Td booster every 10 years.  Zoster vaccine. You may need this after age 86.  Pneumococcal 13-valent conjugate (PCV13) vaccine. You may need this if you have certain conditions and were not previously vaccinated.  Pneumococcal polysaccharide (PPSV23) vaccine. You may need one or two doses if you smoke cigarettes or if you have certain conditions. Talk to your health care provider about which screenings and vaccines you need and how often you need them. This information is not intended to replace advice given to you by your health care provider. Make sure you discuss any questions you have with your health care provider. Document Released: 11/05/2015 Document Revised: 06/28/2016 Document Reviewed: 08/10/2015 Elsevier Interactive Patient Education  2017 [employer] Prevention in the Home Falls can cause injuries. They can happen to people of all ages. There are many things you can do to make your home safe and to help prevent falls. What can I do on the outside of my home?  Regularly fix the edges of walkways and driveways and fix any cracks.  Remove anything that might make you trip as you walk through a door, such as a raised step or threshold.  Trim any bushes or trees on the path to your home.  Use bright outdoor lighting.  Clear any walking paths of anything that might make someone trip, such as rocks or tools.  Regularly check to see if handrails are loose or broken. Make sure that both sides of any steps have handrails.  Any raised decks and porches should have guardrails on the edges.  Have any leaves, snow, or ice cleared regularly.  Use sand or salt on walking paths during  winter.  Clean up any spills in your garage right away. This includes oil or grease spills. What can I do in the bathroom?  Use night lights.  Install grab bars by the toilet and in the tub and shower. Do not use towel bars as grab bars.  Use non-skid mats or decals in the tub or shower.  If you need to sit down in the shower, use a plastic, non-slip stool.  Keep the floor dry. Clean up any water that spills on the floor as soon as it happens.  Remove soap buildup in the tub or shower regularly.  Attach bath mats securely with double-sided non-slip rug tape.  Do not have throw rugs and other things on the floor that can make you trip. What can I do in the bedroom?  Use night lights.  Make sure that you have a light by your bed that is easy to reach.  Do not use any sheets or blankets that are too big for your bed. They should not hang down onto the floor.  Have a firm chair that has side arms. You can use this for support while you get dressed.  Do not have throw rugs and other things  on the floor that can make you trip. What can I do in the kitchen?  Clean up any spills right away.  Avoid walking on wet floors.  Keep items that you use a lot in easy-to-reach places.  If you need to reach something above you, use a strong step stool that has a grab bar.  Keep electrical cords out of the way.  Do not use floor polish or wax that makes floors slippery. If you must use wax, use non-skid floor wax.  Do not have throw rugs and other things on the floor that can make you trip. What can I do with my stairs?  Do not leave any items on the stairs.  Make sure that there are handrails on both sides of the stairs and use them. Fix handrails that are broken or loose. Make sure that handrails are as long as the stairways.  Check any carpeting to make sure that it is firmly attached to the stairs. Fix any carpet that is loose or worn.  Avoid having throw rugs at the top or  bottom of the stairs. If you do have throw rugs, attach them to the floor with carpet tape.  Make sure that you have a light switch at the top of the stairs and the bottom of the stairs. If you do not have them, ask someone to add them for you. What else can I do to help prevent falls?  Wear shoes that:  Do not have high heels.  Have rubber bottoms.  Are comfortable and fit you well.  Are closed at the toe. Do not wear sandals.  If you use a stepladder:  Make sure that it is fully opened. Do not climb a closed stepladder.  Make sure that both sides of the stepladder are locked into place.  Ask someone to hold it for you, if possible.  Clearly mark and make sure that you can see:  Any grab bars or handrails.  First and last steps.  Where the edge of each step is.  Use tools that help you move around (mobility aids) if they are needed. These include:  Canes.  Walkers.  Scooters.  Crutches.  Turn on the lights when you go into a dark area. Replace any light bulbs as soon as they burn out.  Set up your furniture so you have a clear path. Avoid moving your furniture around.  If any of your floors are uneven, fix them.  If there are any pets around you, be aware of where they are.  Review your medicines with your doctor. Some medicines can make you feel dizzy. This can increase your chance of falling. Ask your doctor what other things that you can do to help prevent falls. This information is not intended to replace advice given to you by your health care provider. Make sure you discuss any questions you have with your health care provider. Document Released: 08/05/2009 Document Revised: 03/16/2016 Document Reviewed: 11/13/2014 Elsevier Interactive Patient Education  2017 [employer].

## 2021-03-14 NOTE — Progress Notes (Signed)
Virtual Visit via Telephone Note  I connected with  [name] on 03/14/21 at  1:00 PM EDT by telephone and verified that I am speaking with the correct person using two identifiers.  Medicare Annual Wellness visit completed telephonically due to Covid-19 pandemic.   Persons participating in this call: This Health Coach and this patient.   Location: Patient: Home Provider: Office    I discussed the limitations, risks, security and privacy concerns of performing an evaluation and management service by telephone and the availability of in person appointments. The patient expressed understanding and agreed to proceed.  Unable to perform video visit due to video visit attempted and failed and/or patient does not have video capability.   Some vital signs may be absent or patient reported.   Willette Brace, LPN    Subjective:   PHILISHA WEINEL is a 40 y.o. female who presents for Medicare Annual (Subsequent) preventive examination.  Review of Systems     Cardiac Risk Factors include: hypertension;obesity (BMI >30kg/m2)     Objective:    There were no vitals filed for this visit. There is no height or weight on file to calculate BMI.  Advanced Directives 03/14/2021 02/12/2020 12/11/2019 10/07/2019 09/02/2019 08/29/2019 08/27/2019  Does Patient Have a Medical Advance Directive? No No No No No No No  Would patient like information on creating a medical advance directive? No - Patient declined Yes (MAU/Ambulatory/Procedural Areas - Information given) No - Patient declined No - Patient declined No - Patient declined - No - Patient declined    Current Medications (verified) Outpatient Encounter Medications as of 03/14/2021  Medication Sig  . b complex-vitamin c-folic acid (NEPHRO-VITE) 0.8 MG TABS tablet Take 1 tablet by mouth at bedtime.   . cinacalcet (SENSIPAR) 30 MG tablet Take 30 mg by mouth every evening.   Marland Kitchen desonide (DESOWEN) 0.05 % ointment Apply 1 application  topically 2 (two) times daily as needed (skin irritation (face)).  Marland Kitchen dicyclomine (BENTYL) 10 MG capsule Take 1 capsule (10 mg total) by mouth 3 (three) times daily as needed for spasms.  . ferric citrate (AURYXIA) 1 GM 210 MG(Fe) tablet Take 630 mg by mouth 3 (three) times daily with meals.   . hydroxychloroquine (PLAQUENIL) 200 MG tablet TAKE 1 TABLET BY MOUTH TWICE A DAY (Patient taking differently: Take 200 mg by mouth 2 (two) times daily.)  . lidocaine-prilocaine (EMLA) cream Apply 1 application topically daily as needed. (Patient taking differently: Apply 1 application topically daily as needed (prior to port accessed.).)  . macitentan (OPSUMIT) 10 MG tablet Take 10 mg by mouth daily.  . ondansetron (ZOFRAN-ODT) 4 MG disintegrating tablet Take 1 tablet (4 mg total) by mouth every 8 (eight) hours as needed for nausea or vomiting.  . pantoprazole (PROTONIX) 40 MG tablet TAKE 1 TABLET BY MOUTH EVERY DAY  . predniSONE (DELTASONE) 20 MG tablet Take 2 tablets (40 mg total) by mouth daily with breakfast.  . predniSONE (DELTASONE) 5 MG tablet Take 2.5 mg by mouth every evening.  . triamcinolone ointment (KENALOG) 0.1 % Apply 1 application topically daily as needed (FOR RASH/SKIN IRRITATION.).  Marland Kitchen cyclobenzaprine (FLEXERIL) 5 MG tablet Take 0.5 tablets (2.5 mg total) by mouth 2 (two) times daily as needed for muscle spasms (no driving for 8 hours after taking). (Patient not taking: Reported on 03/14/2021)  . [DISCONTINUED] midodrine (PROAMATINE) 10 MG tablet Take 10 mg by mouth every Monday, Wednesday, and Friday with hemodialysis. (Patient not taking: Reported on 03/14/2021)  No facility-administered encounter medications on file as of 03/14/2021.    Allergies (verified) Lisinopril and Nsaids   History: Past Medical History:  Diagnosis Date  . CHF (congestive heart failure) ([place]) 5-6 yrs ago  hosp  . Chronic kidney disease    Hemo MWF [employer]  . Dyspnea    with exertion  . ERYTHEMATOSUS,  LUPUS 08/07/2006  . GERD (gastroesophageal reflux disease)   . History of blood transfusion   . Hypertension   . Intestinal infection due to Clostridium difficile   . On supplemental oxygen by nasal cannula    as needed; does not wear CPAP or Bipap  . Pulmonary hypertension ([place])   . Secondary cardiomyopathy, unspecified   . Sleep apnea    does not wear CPAP, pt states she was never diagnosed with it  . Stroke ([place])    11/19/2018 mini stroke -> 10 years no residual effects.  . Systemic lupus erythematosus ([place])   . TIA (transient ischemic attack)   . Unspecified deficiency anemia   . Unspecified essential hypertension    Past Surgical History:  Procedure Laterality Date  . AV FISTULA PLACEMENT Right 05/23/2016   Procedure: ARTERIOVENOUS (AV) FISTULA CREATION VERSUS GRAFT INSERTION;  Surgeon: Angelia Mould, MD;  Location: [facility];  Service: Vascular;  Laterality: Right;  . AV FISTULA PLACEMENT Left 10/07/2019   Procedure: Creation of left arm Brachiocephalic Fistula;  Surgeon: Waynetta Sandy, MD;  Location: [facility];  Service: Vascular;  Laterality: Left;  . BIOPSY  08/12/2018   Procedure: BIOPSY;  Surgeon: Ladene Artist, MD;  Location: [facility];  Service: Endoscopy;;  . BREAST SURGERY     biopsy  . CARDIAC CATHETERIZATION    . COLONOSCOPY WITH PROPOFOL N/A 08/12/2018   Procedure: COLONOSCOPY WITH PROPOFOL;  Surgeon: Ladene Artist, MD;  Location: [facility];  Service: Endoscopy;  Laterality: N/A;  . ESOPHAGOGASTRODUODENOSCOPY (EGD) WITH PROPOFOL N/A 08/12/2018   Procedure: ESOPHAGOGASTRODUODENOSCOPY (EGD) WITH PROPOFOL;  Surgeon: Ladene Artist, MD;  [facility];  Service: Endoscopy;  Laterality: N/A;  . FISTULA SUPERFICIALIZATION Left 12/11/2019   Procedure: LEFT ARM ARTERIOVENOUS FISTULA  TRANSPOSITION;  Surgeon: Waynetta Sandy, MD;  Location: [facility];  Service: Vascular;  Laterality: Left;  . INSERTION OF DIALYSIS CATHETER N/A 08/29/2019    Procedure: INSERTION OF TUNNEL DIALYSIS CATHETER;  Surgeon: Angelia Mould, MD;  Location: [facility] OR;  Service: Vascular;  Laterality: N/A;  . kidney biopsies     to determine lupus nephritis  . REVISON OF ARTERIOVENOUS FISTULA Right 08/27/2019   Procedure: REVISON OF ARTERIOVENOUS FISTULA  AND PLICATION RIGHT ARM;  Surgeon: Rosetta Posner, MD;  Location: [facility] OR;  Service: Vascular;  Laterality: Right;  . UPPER EXTREMITY VENOGRAPHY Bilateral 09/02/2019   Procedure: UPPER EXTREMITY VENOGRAPHY;  Surgeon: Serafina Mitchell, MD;  Location: [facility];  Service: Cardiovascular;  Laterality: Bilateral;   Family History  Problem Relation Age of Onset  . Lupus Mother   . Cancer Maternal Grandmother        unknown  . Prostate cancer Paternal Grandfather   . Hypertension Father   . Diabetes Brother   . Diabetes Other        mat cousin   Social History   Socioeconomic History  . Marital status: Single    Spouse name: Not on file  . Number of children: 0  . Years of education: Not on file  . Highest education level: Not on file  Occupational  History  . Occupation: Education administrator   . Occupation: Scientist, clinical (histocompatibility and immunogenetics): [employer]  Tobacco Use  . Smoking status: Never Smoker  . Smokeless tobacco: Never Used  Vaping Use  . Vaping Use: Never used  Substance and Sexual Activity  . Alcohol use: No    Alcohol/week: 0.0 standard drinks  . Drug use: No  . Sexual activity: Never  Other Topics Concern  . Not on file  Social History Narrative   Lives with sister.       Social Determinants of Health   Financial Resource Strain: Low Risk   . Difficulty of Paying Living Expenses: Not hard at all  Food Insecurity: No Food Insecurity  . Worried About Charity fundraiser in the Last Year: Never true  . Ran Out of Food in the Last Year: Never true  Transportation Needs: No Transportation Needs  . Lack of Transportation (Medical): No  . Lack of Transportation (Non-Medical): No   Physical Activity: Insufficiently Active  . Days of Exercise per Week: 3 days  . Minutes of Exercise per Session: 40 min  Stress: No Stress Concern Present  . Feeling of Stress : Not at all  Social Connections: Socially Isolated  . Frequency of Communication with Friends and Family: More than three times a week  . Frequency of Social Gatherings with Friends and Family: More than three times a week  . Attends Religious Services: Never  . Active Member of Clubs or Organizations: No  . Attends Archivist Meetings: Never  . Marital Status: Never married    Tobacco Counseling Counseling given: Not Answered   Clinical Intake:  Pre-visit preparation completed: Yes  Pain : No/denies pain     BMI - recorded: 32.86 Nutritional Status: BMI > 30  Obese Nutritional Risks: None Diabetes: No  How often do you need to have someone help you when you read instructions, pamphlets, or other written materials from your doctor or pharmacy?: 1 - Never  Diabetic?No  Interpreter Needed?: No  Information entered by :: Charlott Rakes, LPN   Activities of Daily Living In your present state of health, do you have any difficulty performing the following activities: 03/14/2021  Hearing? N  Vision? N  Difficulty concentrating or making decisions? N  Walking or climbing stairs? N  Dressing or bathing? N  Doing errands, shopping? N  Preparing Food and eating ? N  Using the Toilet? N  In the past six months, have you accidently leaked urine? N  Do you have problems with loss of bowel control? N  Managing your Medications? N  Managing your Finances? N  Housekeeping or managing your Housekeeping? N  Some recent data might be hidden    Patient Care Team: Marin Olp, MD as PCP - General (Family Medicine) Bo Merino, MD as Consulting Physician (Rheumatology) Salem Senate, MD as Consulting Physician (Nephrology) Jamal Maes, MD as Consulting Physician  (Nephrology) [facility], MD as Consulting Physician (Obstetrics and Gynecology) Minus Breeding, MD as Consulting Physician (Cardiology) Early, Arvilla Meres, MD as Consulting Physician (Vascular Surgery)  Indicate any recent Medical Services you may have received from other than [facility] providers in the past year (date may be approximate).     Assessment:   This is a routine wellness examination for [place].  Hearing/Vision screen  Hearing Screening   125Hz  250Hz  500Hz  1000Hz  2000Hz  3000Hz  4000Hz  6000Hz  8000Hz   Right ear:  Left ear:           Comments: Pt denies any hearing   Vision Screening Comments: Pt encouraged to follow up with annual eye exams  Dietary issues and exercise activities discussed: Current Exercise Habits: Home exercise routine, Type of exercise: walking, Time (Minutes): 45, Frequency (Times/Week): 3, Weekly Exercise (Minutes/Week): 135  Goals Addressed            This Visit's Progress   . Patient Stated       None at this time      Depression Screen PHQ 2/9 Scores 03/14/2021 02/12/2020 07/12/2018 11/07/2011  PHQ - 2 Score 0 0 0 0    Fall Risk Fall Risk  03/14/2021 02/12/2020 07/12/2018  Falls in the past year? 0 - No  Number falls in past yr: 0 0 -  Injury with Fall? 0 0 -  Risk for fall due to : Impaired vision Medication side effect -  Follow up Falls prevention discussed Falls evaluation completed;Education provided;Falls prevention discussed -    FALL RISK PREVENTION PERTAINING TO THE HOME:  Home free of loose throw rugs in walkways, pet beds, electrical cords, etc? Yes  Adequate lighting in your home to reduce risk of falls? Yes   ASSISTIVE DEVICES UTILIZED TO PREVENT FALLS:  Life alert? No  Use of a cane, walker or w/c? No  Grab bars in the bathroom? No  Shower chair or bench in shower? No  Elevated toilet seat or a handicapped toilet? No   TIMED UP AND GO:  Was the test performed? No .      Cognitive Function:     6CIT Screen 03/14/2021  What Year? 0 points  What month? 0 points  Count back from 20 0 points  Months in reverse 2 points  Repeat phrase 4 points    Immunizations Immunization History  Administered Date(s) Administered  . Hepatitis B 10/06/2015, 11/16/2015, 12/16/2015, 05/01/2016  . Hepatitis B, adult 10/06/2015, 11/16/2015, 12/16/2015, 05/01/2016  . Hepatitis B, ped/adol 10/06/2015, 11/16/2015, 12/16/2015, 05/01/2016  . Influenza Split 06/30/2010, 07/06/2011, 08/14/2012  . Influenza Whole 06/30/2010  . Influenza,inj,Quad PF,6+ Mos 11/26/2013, 07/23/2019, 07/30/2020  . Influenza,inj,quad, With Preservative 11/26/2013, 07/23/2019  . Influenza-Unspecified 07/06/2011, 08/14/2012, 11/26/2013, 08/04/2015, 11/02/2017, 08/16/2018, 07/23/2019  . Moderna Sars-Covid-2 Vaccination 03/18/2020, 04/15/2020, 12/23/2020  . Pneumococcal Conjugate-13 09/29/2015  . Pneumococcal Polysaccharide-23 07/08/2016  . Tdap 11/26/2013    TDAP status: Up to date  Flu Vaccine status: Up to date    Covid-19 vaccine status: Completed vaccines  Qualifies for Shingles Vaccine? No    Screening Tests Health Maintenance  Topic Date Due  . INFLUENZA VACCINE  05/23/2021  . PAP SMEAR-Modifier  07/02/2022  . TETANUS/TDAP  11/27/2023  . COVID-19 Vaccine  Completed  . Hepatitis C Screening  Completed  . HIV Screening  Completed  . HPV VACCINES  Aged Out    Health Maintenance  There are no preventive care reminders to display for this patient.  Colorectal cancer screening: Type of screening: Colonoscopy. Completed 08/12/18. Repeat every 10 yrs at age 65  years  Mammogram status: Completed 12/21/2020 per pt . Repeat every year    Lung Cancer Screening: (Low Dose CT Chest recommended if Age 28-80 years, 30 pack-year currently smoking OR have quit w/in 15years.)  Additional Screening:  Hepatitis C Screening:  Completed 03/22/16  Vision Screening: Recommended annual  ophthalmology exams for early detection of glaucoma and other disorders of the eye. Is the patient up to date with their annual eye  exam?  No  Who is the provider or what is the name of the office in which the patient attends annual eye exams? Encouraged pt to follow up If pt is not established with a provider, would they like to be referred to a provider to establish care? No .   Dental Screening: Recommended annual dental exams for proper oral hygiene  Community Resource Referral / Chronic Care Management: CRR required this visit?  No   CCM required this visit?  No      Plan:     I have personally reviewed and noted the following in the patient's chart:   . Medical and social history . Use of alcohol, tobacco or illicit drugs  . Current medications and supplements including opioid prescriptions.  . Functional ability and status . Nutritional status . Physical activity . Advanced directives . List of other physicians . Hospitalizations, surgeries, and ER visits in previous 12 months . Vitals . Screenings to include cognitive, depression, and falls . Referrals and appointments  In addition, I have reviewed and discussed with patient certain preventive protocols, quality metrics, and best practice recommendations. A written personalized care plan for preventive services as well as general preventive health recommendations were provided to patient.     Willette Brace, LPN   03/29/3709   Nurse Notes: None

## 2021-03-15 DIAGNOSIS — Z992 Dependence on renal dialysis: Secondary | ICD-10-CM | POA: Diagnosis not present

## 2021-03-15 DIAGNOSIS — N2581 Secondary hyperparathyroidism of renal origin: Secondary | ICD-10-CM | POA: Diagnosis not present

## 2021-03-15 DIAGNOSIS — N186 End stage renal disease: Secondary | ICD-10-CM | POA: Diagnosis not present

## 2021-03-15 DIAGNOSIS — D631 Anemia in chronic kidney disease: Secondary | ICD-10-CM | POA: Diagnosis not present

## 2021-03-16 DIAGNOSIS — Z7952 Long term (current) use of systemic steroids: Secondary | ICD-10-CM | POA: Diagnosis not present

## 2021-03-16 DIAGNOSIS — M3219 Other organ or system involvement in systemic lupus erythematosus: Secondary | ICD-10-CM | POA: Diagnosis not present

## 2021-03-17 DIAGNOSIS — N2581 Secondary hyperparathyroidism of renal origin: Secondary | ICD-10-CM | POA: Diagnosis not present

## 2021-03-17 DIAGNOSIS — Z992 Dependence on renal dialysis: Secondary | ICD-10-CM | POA: Diagnosis not present

## 2021-03-17 DIAGNOSIS — D631 Anemia in chronic kidney disease: Secondary | ICD-10-CM | POA: Diagnosis not present

## 2021-03-17 DIAGNOSIS — N186 End stage renal disease: Secondary | ICD-10-CM | POA: Diagnosis not present

## 2021-03-18 DIAGNOSIS — N186 End stage renal disease: Secondary | ICD-10-CM | POA: Diagnosis not present

## 2021-03-18 DIAGNOSIS — I509 Heart failure, unspecified: Secondary | ICD-10-CM | POA: Diagnosis not present

## 2021-03-18 DIAGNOSIS — I272 Pulmonary hypertension, unspecified: Secondary | ICD-10-CM | POA: Diagnosis not present

## 2021-03-18 DIAGNOSIS — M3219 Other organ or system involvement in systemic lupus erythematosus: Secondary | ICD-10-CM | POA: Diagnosis not present

## 2021-03-18 DIAGNOSIS — Z992 Dependence on renal dialysis: Secondary | ICD-10-CM | POA: Diagnosis not present

## 2021-03-18 DIAGNOSIS — D631 Anemia in chronic kidney disease: Secondary | ICD-10-CM | POA: Diagnosis not present

## 2021-03-18 DIAGNOSIS — N2581 Secondary hyperparathyroidism of renal origin: Secondary | ICD-10-CM | POA: Diagnosis not present

## 2021-03-19 DIAGNOSIS — N2581 Secondary hyperparathyroidism of renal origin: Secondary | ICD-10-CM | POA: Diagnosis not present

## 2021-03-19 DIAGNOSIS — Z992 Dependence on renal dialysis: Secondary | ICD-10-CM | POA: Diagnosis not present

## 2021-03-19 DIAGNOSIS — N186 End stage renal disease: Secondary | ICD-10-CM | POA: Diagnosis not present

## 2021-03-20 DIAGNOSIS — N2581 Secondary hyperparathyroidism of renal origin: Secondary | ICD-10-CM | POA: Diagnosis not present

## 2021-03-20 DIAGNOSIS — Z992 Dependence on renal dialysis: Secondary | ICD-10-CM | POA: Diagnosis not present

## 2021-03-20 DIAGNOSIS — N186 End stage renal disease: Secondary | ICD-10-CM | POA: Diagnosis not present

## 2021-03-22 DIAGNOSIS — Z992 Dependence on renal dialysis: Secondary | ICD-10-CM | POA: Diagnosis not present

## 2021-03-22 DIAGNOSIS — N2581 Secondary hyperparathyroidism of renal origin: Secondary | ICD-10-CM | POA: Diagnosis not present

## 2021-03-22 DIAGNOSIS — N186 End stage renal disease: Secondary | ICD-10-CM | POA: Diagnosis not present

## 2021-03-23 DIAGNOSIS — Z23 Encounter for immunization: Secondary | ICD-10-CM | POA: Diagnosis not present

## 2021-03-23 DIAGNOSIS — K589 Irritable bowel syndrome without diarrhea: Secondary | ICD-10-CM | POA: Diagnosis not present

## 2021-03-23 DIAGNOSIS — M329 Systemic lupus erythematosus, unspecified: Secondary | ICD-10-CM | POA: Diagnosis not present

## 2021-03-23 DIAGNOSIS — L930 Discoid lupus erythematosus: Secondary | ICD-10-CM | POA: Diagnosis not present

## 2021-03-23 DIAGNOSIS — N186 End stage renal disease: Secondary | ICD-10-CM | POA: Diagnosis not present

## 2021-03-23 DIAGNOSIS — Z992 Dependence on renal dialysis: Secondary | ICD-10-CM | POA: Diagnosis not present

## 2021-03-23 DIAGNOSIS — Z1159 Encounter for screening for other viral diseases: Secondary | ICD-10-CM | POA: Diagnosis not present

## 2021-03-24 DIAGNOSIS — N186 End stage renal disease: Secondary | ICD-10-CM | POA: Diagnosis not present

## 2021-03-24 DIAGNOSIS — I953 Hypotension of hemodialysis: Secondary | ICD-10-CM | POA: Diagnosis not present

## 2021-03-24 DIAGNOSIS — N2581 Secondary hyperparathyroidism of renal origin: Secondary | ICD-10-CM | POA: Diagnosis not present

## 2021-03-24 DIAGNOSIS — Z23 Encounter for immunization: Secondary | ICD-10-CM | POA: Diagnosis not present

## 2021-03-24 DIAGNOSIS — E875 Hyperkalemia: Secondary | ICD-10-CM | POA: Diagnosis not present

## 2021-03-24 DIAGNOSIS — Z992 Dependence on renal dialysis: Secondary | ICD-10-CM | POA: Diagnosis not present

## 2021-03-25 DIAGNOSIS — Z992 Dependence on renal dialysis: Secondary | ICD-10-CM | POA: Diagnosis not present

## 2021-03-25 DIAGNOSIS — Z23 Encounter for immunization: Secondary | ICD-10-CM | POA: Diagnosis not present

## 2021-03-25 DIAGNOSIS — I953 Hypotension of hemodialysis: Secondary | ICD-10-CM | POA: Diagnosis not present

## 2021-03-25 DIAGNOSIS — N186 End stage renal disease: Secondary | ICD-10-CM | POA: Diagnosis not present

## 2021-03-25 DIAGNOSIS — E875 Hyperkalemia: Secondary | ICD-10-CM | POA: Diagnosis not present

## 2021-03-25 DIAGNOSIS — N2581 Secondary hyperparathyroidism of renal origin: Secondary | ICD-10-CM | POA: Diagnosis not present

## 2021-03-28 DIAGNOSIS — Z992 Dependence on renal dialysis: Secondary | ICD-10-CM | POA: Diagnosis not present

## 2021-03-28 DIAGNOSIS — N2581 Secondary hyperparathyroidism of renal origin: Secondary | ICD-10-CM | POA: Diagnosis not present

## 2021-03-28 DIAGNOSIS — N186 End stage renal disease: Secondary | ICD-10-CM | POA: Diagnosis not present

## 2021-03-28 DIAGNOSIS — I953 Hypotension of hemodialysis: Secondary | ICD-10-CM | POA: Diagnosis not present

## 2021-03-28 DIAGNOSIS — E875 Hyperkalemia: Secondary | ICD-10-CM | POA: Diagnosis not present

## 2021-03-28 DIAGNOSIS — Z23 Encounter for immunization: Secondary | ICD-10-CM | POA: Diagnosis not present

## 2021-03-29 DIAGNOSIS — I953 Hypotension of hemodialysis: Secondary | ICD-10-CM | POA: Diagnosis not present

## 2021-03-29 DIAGNOSIS — N186 End stage renal disease: Secondary | ICD-10-CM | POA: Diagnosis not present

## 2021-03-29 DIAGNOSIS — E875 Hyperkalemia: Secondary | ICD-10-CM | POA: Diagnosis not present

## 2021-03-29 DIAGNOSIS — Z23 Encounter for immunization: Secondary | ICD-10-CM | POA: Diagnosis not present

## 2021-03-29 DIAGNOSIS — N2581 Secondary hyperparathyroidism of renal origin: Secondary | ICD-10-CM | POA: Diagnosis not present

## 2021-03-29 DIAGNOSIS — Z992 Dependence on renal dialysis: Secondary | ICD-10-CM | POA: Diagnosis not present

## 2021-03-31 DIAGNOSIS — E875 Hyperkalemia: Secondary | ICD-10-CM | POA: Diagnosis not present

## 2021-03-31 DIAGNOSIS — Z992 Dependence on renal dialysis: Secondary | ICD-10-CM | POA: Diagnosis not present

## 2021-03-31 DIAGNOSIS — N186 End stage renal disease: Secondary | ICD-10-CM | POA: Diagnosis not present

## 2021-03-31 DIAGNOSIS — Z23 Encounter for immunization: Secondary | ICD-10-CM | POA: Diagnosis not present

## 2021-03-31 DIAGNOSIS — N2581 Secondary hyperparathyroidism of renal origin: Secondary | ICD-10-CM | POA: Diagnosis not present

## 2021-03-31 DIAGNOSIS — I953 Hypotension of hemodialysis: Secondary | ICD-10-CM | POA: Diagnosis not present

## 2021-04-01 DIAGNOSIS — N2581 Secondary hyperparathyroidism of renal origin: Secondary | ICD-10-CM | POA: Diagnosis not present

## 2021-04-01 DIAGNOSIS — N186 End stage renal disease: Secondary | ICD-10-CM | POA: Diagnosis not present

## 2021-04-01 DIAGNOSIS — Z992 Dependence on renal dialysis: Secondary | ICD-10-CM | POA: Diagnosis not present

## 2021-04-01 DIAGNOSIS — E875 Hyperkalemia: Secondary | ICD-10-CM | POA: Diagnosis not present

## 2021-04-01 DIAGNOSIS — Z23 Encounter for immunization: Secondary | ICD-10-CM | POA: Diagnosis not present

## 2021-04-01 DIAGNOSIS — I953 Hypotension of hemodialysis: Secondary | ICD-10-CM | POA: Diagnosis not present

## 2021-04-04 DIAGNOSIS — Z992 Dependence on renal dialysis: Secondary | ICD-10-CM | POA: Diagnosis not present

## 2021-04-04 DIAGNOSIS — N2581 Secondary hyperparathyroidism of renal origin: Secondary | ICD-10-CM | POA: Diagnosis not present

## 2021-04-04 DIAGNOSIS — E875 Hyperkalemia: Secondary | ICD-10-CM | POA: Diagnosis not present

## 2021-04-04 DIAGNOSIS — I953 Hypotension of hemodialysis: Secondary | ICD-10-CM | POA: Diagnosis not present

## 2021-04-04 DIAGNOSIS — Z23 Encounter for immunization: Secondary | ICD-10-CM | POA: Diagnosis not present

## 2021-04-04 DIAGNOSIS — N186 End stage renal disease: Secondary | ICD-10-CM | POA: Diagnosis not present

## 2021-04-05 DIAGNOSIS — Z23 Encounter for immunization: Secondary | ICD-10-CM | POA: Diagnosis not present

## 2021-04-05 DIAGNOSIS — E875 Hyperkalemia: Secondary | ICD-10-CM | POA: Diagnosis not present

## 2021-04-05 DIAGNOSIS — Z992 Dependence on renal dialysis: Secondary | ICD-10-CM | POA: Diagnosis not present

## 2021-04-05 DIAGNOSIS — I953 Hypotension of hemodialysis: Secondary | ICD-10-CM | POA: Diagnosis not present

## 2021-04-05 DIAGNOSIS — N2581 Secondary hyperparathyroidism of renal origin: Secondary | ICD-10-CM | POA: Diagnosis not present

## 2021-04-05 DIAGNOSIS — N186 End stage renal disease: Secondary | ICD-10-CM | POA: Diagnosis not present

## 2021-04-07 DIAGNOSIS — N2581 Secondary hyperparathyroidism of renal origin: Secondary | ICD-10-CM | POA: Diagnosis not present

## 2021-04-07 DIAGNOSIS — Z23 Encounter for immunization: Secondary | ICD-10-CM | POA: Diagnosis not present

## 2021-04-07 DIAGNOSIS — E875 Hyperkalemia: Secondary | ICD-10-CM | POA: Diagnosis not present

## 2021-04-07 DIAGNOSIS — I953 Hypotension of hemodialysis: Secondary | ICD-10-CM | POA: Diagnosis not present

## 2021-04-07 DIAGNOSIS — Z992 Dependence on renal dialysis: Secondary | ICD-10-CM | POA: Diagnosis not present

## 2021-04-07 DIAGNOSIS — N186 End stage renal disease: Secondary | ICD-10-CM | POA: Diagnosis not present

## 2021-04-08 DIAGNOSIS — I120 Hypertensive chronic kidney disease with stage 5 chronic kidney disease or end stage renal disease: Secondary | ICD-10-CM | POA: Diagnosis not present

## 2021-04-08 DIAGNOSIS — Z743 Need for continuous supervision: Secondary | ICD-10-CM | POA: Diagnosis not present

## 2021-04-08 DIAGNOSIS — T82838A Hemorrhage of vascular prosthetic devices, implants and grafts, initial encounter: Secondary | ICD-10-CM | POA: Diagnosis not present

## 2021-04-08 DIAGNOSIS — I953 Hypotension of hemodialysis: Secondary | ICD-10-CM | POA: Diagnosis not present

## 2021-04-08 DIAGNOSIS — Z79899 Other long term (current) drug therapy: Secondary | ICD-10-CM | POA: Diagnosis not present

## 2021-04-08 DIAGNOSIS — Z992 Dependence on renal dialysis: Secondary | ICD-10-CM | POA: Diagnosis not present

## 2021-04-08 DIAGNOSIS — E875 Hyperkalemia: Secondary | ICD-10-CM | POA: Diagnosis not present

## 2021-04-08 DIAGNOSIS — N2581 Secondary hyperparathyroidism of renal origin: Secondary | ICD-10-CM | POA: Diagnosis not present

## 2021-04-08 DIAGNOSIS — Z888 Allergy status to other drugs, medicaments and biological substances status: Secondary | ICD-10-CM | POA: Diagnosis not present

## 2021-04-08 DIAGNOSIS — Z23 Encounter for immunization: Secondary | ICD-10-CM | POA: Diagnosis not present

## 2021-04-08 DIAGNOSIS — M329 Systemic lupus erythematosus, unspecified: Secondary | ICD-10-CM | POA: Diagnosis not present

## 2021-04-08 DIAGNOSIS — Z7952 Long term (current) use of systemic steroids: Secondary | ICD-10-CM | POA: Diagnosis not present

## 2021-04-08 DIAGNOSIS — T82590A Other mechanical complication of surgically created arteriovenous fistula, initial encounter: Secondary | ICD-10-CM | POA: Diagnosis not present

## 2021-04-08 DIAGNOSIS — N186 End stage renal disease: Secondary | ICD-10-CM | POA: Diagnosis not present

## 2021-04-08 DIAGNOSIS — I272 Pulmonary hypertension, unspecified: Secondary | ICD-10-CM | POA: Diagnosis not present

## 2021-04-08 DIAGNOSIS — R58 Hemorrhage, not elsewhere classified: Secondary | ICD-10-CM | POA: Diagnosis not present

## 2021-04-11 DIAGNOSIS — I953 Hypotension of hemodialysis: Secondary | ICD-10-CM | POA: Diagnosis not present

## 2021-04-11 DIAGNOSIS — Z992 Dependence on renal dialysis: Secondary | ICD-10-CM | POA: Diagnosis not present

## 2021-04-11 DIAGNOSIS — N2581 Secondary hyperparathyroidism of renal origin: Secondary | ICD-10-CM | POA: Diagnosis not present

## 2021-04-11 DIAGNOSIS — E875 Hyperkalemia: Secondary | ICD-10-CM | POA: Diagnosis not present

## 2021-04-11 DIAGNOSIS — Z23 Encounter for immunization: Secondary | ICD-10-CM | POA: Diagnosis not present

## 2021-04-11 DIAGNOSIS — N186 End stage renal disease: Secondary | ICD-10-CM | POA: Diagnosis not present

## 2021-04-12 DIAGNOSIS — E875 Hyperkalemia: Secondary | ICD-10-CM | POA: Diagnosis not present

## 2021-04-12 DIAGNOSIS — N2581 Secondary hyperparathyroidism of renal origin: Secondary | ICD-10-CM | POA: Diagnosis not present

## 2021-04-12 DIAGNOSIS — Z992 Dependence on renal dialysis: Secondary | ICD-10-CM | POA: Diagnosis not present

## 2021-04-12 DIAGNOSIS — Z23 Encounter for immunization: Secondary | ICD-10-CM | POA: Diagnosis not present

## 2021-04-12 DIAGNOSIS — N186 End stage renal disease: Secondary | ICD-10-CM | POA: Diagnosis not present

## 2021-04-12 DIAGNOSIS — I953 Hypotension of hemodialysis: Secondary | ICD-10-CM | POA: Diagnosis not present

## 2021-04-13 DIAGNOSIS — M3214 Glomerular disease in systemic lupus erythematosus: Secondary | ICD-10-CM | POA: Diagnosis not present

## 2021-04-13 DIAGNOSIS — R0602 Shortness of breath: Secondary | ICD-10-CM | POA: Diagnosis not present

## 2021-04-13 DIAGNOSIS — Z992 Dependence on renal dialysis: Secondary | ICD-10-CM | POA: Diagnosis not present

## 2021-04-13 DIAGNOSIS — I081 Rheumatic disorders of both mitral and tricuspid valves: Secondary | ICD-10-CM | POA: Diagnosis not present

## 2021-04-13 DIAGNOSIS — Z79899 Other long term (current) drug therapy: Secondary | ICD-10-CM | POA: Diagnosis not present

## 2021-04-13 DIAGNOSIS — N186 End stage renal disease: Secondary | ICD-10-CM | POA: Diagnosis not present

## 2021-04-13 DIAGNOSIS — I272 Pulmonary hypertension, unspecified: Secondary | ICD-10-CM | POA: Diagnosis not present

## 2021-04-14 DIAGNOSIS — Z23 Encounter for immunization: Secondary | ICD-10-CM | POA: Diagnosis not present

## 2021-04-14 DIAGNOSIS — N2581 Secondary hyperparathyroidism of renal origin: Secondary | ICD-10-CM | POA: Diagnosis not present

## 2021-04-14 DIAGNOSIS — N186 End stage renal disease: Secondary | ICD-10-CM | POA: Diagnosis not present

## 2021-04-14 DIAGNOSIS — E875 Hyperkalemia: Secondary | ICD-10-CM | POA: Diagnosis not present

## 2021-04-14 DIAGNOSIS — Z992 Dependence on renal dialysis: Secondary | ICD-10-CM | POA: Diagnosis not present

## 2021-04-14 DIAGNOSIS — I953 Hypotension of hemodialysis: Secondary | ICD-10-CM | POA: Diagnosis not present

## 2021-04-15 DIAGNOSIS — N2581 Secondary hyperparathyroidism of renal origin: Secondary | ICD-10-CM | POA: Diagnosis not present

## 2021-04-15 DIAGNOSIS — Z992 Dependence on renal dialysis: Secondary | ICD-10-CM | POA: Diagnosis not present

## 2021-04-15 DIAGNOSIS — N186 End stage renal disease: Secondary | ICD-10-CM | POA: Diagnosis not present

## 2021-04-15 DIAGNOSIS — Z23 Encounter for immunization: Secondary | ICD-10-CM | POA: Diagnosis not present

## 2021-04-15 DIAGNOSIS — E875 Hyperkalemia: Secondary | ICD-10-CM | POA: Diagnosis not present

## 2021-04-15 DIAGNOSIS — I953 Hypotension of hemodialysis: Secondary | ICD-10-CM | POA: Diagnosis not present

## 2021-04-18 DIAGNOSIS — M3219 Other organ or system involvement in systemic lupus erythematosus: Secondary | ICD-10-CM | POA: Diagnosis not present

## 2021-04-18 DIAGNOSIS — N2581 Secondary hyperparathyroidism of renal origin: Secondary | ICD-10-CM | POA: Diagnosis not present

## 2021-04-18 DIAGNOSIS — I509 Heart failure, unspecified: Secondary | ICD-10-CM | POA: Diagnosis not present

## 2021-04-18 DIAGNOSIS — N186 End stage renal disease: Secondary | ICD-10-CM | POA: Diagnosis not present

## 2021-04-18 DIAGNOSIS — I272 Pulmonary hypertension, unspecified: Secondary | ICD-10-CM | POA: Diagnosis not present

## 2021-04-18 DIAGNOSIS — E875 Hyperkalemia: Secondary | ICD-10-CM | POA: Diagnosis not present

## 2021-04-18 DIAGNOSIS — Z992 Dependence on renal dialysis: Secondary | ICD-10-CM | POA: Diagnosis not present

## 2021-04-18 DIAGNOSIS — Z23 Encounter for immunization: Secondary | ICD-10-CM | POA: Diagnosis not present

## 2021-04-18 DIAGNOSIS — I953 Hypotension of hemodialysis: Secondary | ICD-10-CM | POA: Diagnosis not present

## 2021-04-19 DIAGNOSIS — N2581 Secondary hyperparathyroidism of renal origin: Secondary | ICD-10-CM | POA: Diagnosis not present

## 2021-04-19 DIAGNOSIS — E875 Hyperkalemia: Secondary | ICD-10-CM | POA: Diagnosis not present

## 2021-04-19 DIAGNOSIS — N186 End stage renal disease: Secondary | ICD-10-CM | POA: Diagnosis not present

## 2021-04-19 DIAGNOSIS — I953 Hypotension of hemodialysis: Secondary | ICD-10-CM | POA: Diagnosis not present

## 2021-04-19 DIAGNOSIS — Z992 Dependence on renal dialysis: Secondary | ICD-10-CM | POA: Diagnosis not present

## 2021-04-19 DIAGNOSIS — Z23 Encounter for immunization: Secondary | ICD-10-CM | POA: Diagnosis not present

## 2021-04-20 DIAGNOSIS — N186 End stage renal disease: Secondary | ICD-10-CM | POA: Diagnosis not present

## 2021-04-20 DIAGNOSIS — Z79899 Other long term (current) drug therapy: Secondary | ICD-10-CM | POA: Diagnosis not present

## 2021-04-20 DIAGNOSIS — Z7682 Awaiting organ transplant status: Secondary | ICD-10-CM | POA: Diagnosis not present

## 2021-04-20 DIAGNOSIS — M3214 Glomerular disease in systemic lupus erythematosus: Secondary | ICD-10-CM | POA: Diagnosis not present

## 2021-04-20 DIAGNOSIS — R0602 Shortness of breath: Secondary | ICD-10-CM | POA: Diagnosis not present

## 2021-04-20 DIAGNOSIS — M329 Systemic lupus erythematosus, unspecified: Secondary | ICD-10-CM | POA: Diagnosis not present

## 2021-04-20 DIAGNOSIS — I2729 Other secondary pulmonary hypertension: Secondary | ICD-10-CM | POA: Diagnosis not present

## 2021-04-20 DIAGNOSIS — I272 Pulmonary hypertension, unspecified: Secondary | ICD-10-CM | POA: Diagnosis not present

## 2021-04-20 DIAGNOSIS — Z992 Dependence on renal dialysis: Secondary | ICD-10-CM | POA: Diagnosis not present

## 2021-04-20 DIAGNOSIS — Z01810 Encounter for preprocedural cardiovascular examination: Secondary | ICD-10-CM | POA: Diagnosis not present

## 2021-04-21 DIAGNOSIS — E875 Hyperkalemia: Secondary | ICD-10-CM | POA: Diagnosis not present

## 2021-04-21 DIAGNOSIS — Z992 Dependence on renal dialysis: Secondary | ICD-10-CM | POA: Diagnosis not present

## 2021-04-21 DIAGNOSIS — Z23 Encounter for immunization: Secondary | ICD-10-CM | POA: Diagnosis not present

## 2021-04-21 DIAGNOSIS — N186 End stage renal disease: Secondary | ICD-10-CM | POA: Diagnosis not present

## 2021-04-21 DIAGNOSIS — N2581 Secondary hyperparathyroidism of renal origin: Secondary | ICD-10-CM | POA: Diagnosis not present

## 2021-04-21 DIAGNOSIS — I953 Hypotension of hemodialysis: Secondary | ICD-10-CM | POA: Diagnosis not present

## 2021-04-22 DIAGNOSIS — N2581 Secondary hyperparathyroidism of renal origin: Secondary | ICD-10-CM | POA: Diagnosis not present

## 2021-04-22 DIAGNOSIS — T82838A Hemorrhage of vascular prosthetic devices, implants and grafts, initial encounter: Secondary | ICD-10-CM | POA: Diagnosis not present

## 2021-04-22 DIAGNOSIS — Z992 Dependence on renal dialysis: Secondary | ICD-10-CM | POA: Diagnosis not present

## 2021-04-22 DIAGNOSIS — D509 Iron deficiency anemia, unspecified: Secondary | ICD-10-CM | POA: Diagnosis not present

## 2021-04-22 DIAGNOSIS — T82856A Stenosis of peripheral vascular stent, initial encounter: Secondary | ICD-10-CM | POA: Diagnosis not present

## 2021-04-22 DIAGNOSIS — M329 Systemic lupus erythematosus, unspecified: Secondary | ICD-10-CM | POA: Diagnosis not present

## 2021-04-22 DIAGNOSIS — T82898A Other specified complication of vascular prosthetic devices, implants and grafts, initial encounter: Secondary | ICD-10-CM | POA: Diagnosis not present

## 2021-04-22 DIAGNOSIS — I953 Hypotension of hemodialysis: Secondary | ICD-10-CM | POA: Diagnosis not present

## 2021-04-22 DIAGNOSIS — N186 End stage renal disease: Secondary | ICD-10-CM | POA: Diagnosis not present

## 2021-04-22 DIAGNOSIS — T82858A Stenosis of vascular prosthetic devices, implants and grafts, initial encounter: Secondary | ICD-10-CM | POA: Diagnosis not present

## 2021-04-22 DIAGNOSIS — D631 Anemia in chronic kidney disease: Secondary | ICD-10-CM | POA: Diagnosis not present

## 2021-04-22 DIAGNOSIS — E875 Hyperkalemia: Secondary | ICD-10-CM | POA: Diagnosis not present

## 2021-04-22 DIAGNOSIS — I871 Compression of vein: Secondary | ICD-10-CM | POA: Diagnosis not present

## 2021-04-22 DIAGNOSIS — I120 Hypertensive chronic kidney disease with stage 5 chronic kidney disease or end stage renal disease: Secondary | ICD-10-CM | POA: Diagnosis not present

## 2021-04-22 DIAGNOSIS — K219 Gastro-esophageal reflux disease without esophagitis: Secondary | ICD-10-CM | POA: Diagnosis not present

## 2021-04-25 DIAGNOSIS — I953 Hypotension of hemodialysis: Secondary | ICD-10-CM | POA: Diagnosis not present

## 2021-04-25 DIAGNOSIS — Z992 Dependence on renal dialysis: Secondary | ICD-10-CM | POA: Diagnosis not present

## 2021-04-25 DIAGNOSIS — D631 Anemia in chronic kidney disease: Secondary | ICD-10-CM | POA: Diagnosis not present

## 2021-04-25 DIAGNOSIS — N186 End stage renal disease: Secondary | ICD-10-CM | POA: Diagnosis not present

## 2021-04-25 DIAGNOSIS — D509 Iron deficiency anemia, unspecified: Secondary | ICD-10-CM | POA: Diagnosis not present

## 2021-04-25 DIAGNOSIS — E875 Hyperkalemia: Secondary | ICD-10-CM | POA: Diagnosis not present

## 2021-04-25 DIAGNOSIS — N2581 Secondary hyperparathyroidism of renal origin: Secondary | ICD-10-CM | POA: Diagnosis not present

## 2021-04-26 DIAGNOSIS — D631 Anemia in chronic kidney disease: Secondary | ICD-10-CM | POA: Diagnosis not present

## 2021-04-26 DIAGNOSIS — N2581 Secondary hyperparathyroidism of renal origin: Secondary | ICD-10-CM | POA: Diagnosis not present

## 2021-04-26 DIAGNOSIS — E875 Hyperkalemia: Secondary | ICD-10-CM | POA: Diagnosis not present

## 2021-04-26 DIAGNOSIS — Z992 Dependence on renal dialysis: Secondary | ICD-10-CM | POA: Diagnosis not present

## 2021-04-26 DIAGNOSIS — D509 Iron deficiency anemia, unspecified: Secondary | ICD-10-CM | POA: Diagnosis not present

## 2021-04-26 DIAGNOSIS — I953 Hypotension of hemodialysis: Secondary | ICD-10-CM | POA: Diagnosis not present

## 2021-04-26 DIAGNOSIS — N186 End stage renal disease: Secondary | ICD-10-CM | POA: Diagnosis not present

## 2021-04-28 DIAGNOSIS — D509 Iron deficiency anemia, unspecified: Secondary | ICD-10-CM | POA: Diagnosis not present

## 2021-04-28 DIAGNOSIS — N2581 Secondary hyperparathyroidism of renal origin: Secondary | ICD-10-CM | POA: Diagnosis not present

## 2021-04-28 DIAGNOSIS — E875 Hyperkalemia: Secondary | ICD-10-CM | POA: Diagnosis not present

## 2021-04-28 DIAGNOSIS — N186 End stage renal disease: Secondary | ICD-10-CM | POA: Diagnosis not present

## 2021-04-28 DIAGNOSIS — Z992 Dependence on renal dialysis: Secondary | ICD-10-CM | POA: Diagnosis not present

## 2021-04-28 DIAGNOSIS — I953 Hypotension of hemodialysis: Secondary | ICD-10-CM | POA: Diagnosis not present

## 2021-04-28 DIAGNOSIS — D631 Anemia in chronic kidney disease: Secondary | ICD-10-CM | POA: Diagnosis not present

## 2021-04-29 DIAGNOSIS — N186 End stage renal disease: Secondary | ICD-10-CM | POA: Diagnosis not present

## 2021-04-29 DIAGNOSIS — D509 Iron deficiency anemia, unspecified: Secondary | ICD-10-CM | POA: Diagnosis not present

## 2021-04-29 DIAGNOSIS — I953 Hypotension of hemodialysis: Secondary | ICD-10-CM | POA: Diagnosis not present

## 2021-04-29 DIAGNOSIS — N2581 Secondary hyperparathyroidism of renal origin: Secondary | ICD-10-CM | POA: Diagnosis not present

## 2021-04-29 DIAGNOSIS — Z992 Dependence on renal dialysis: Secondary | ICD-10-CM | POA: Diagnosis not present

## 2021-04-29 DIAGNOSIS — D631 Anemia in chronic kidney disease: Secondary | ICD-10-CM | POA: Diagnosis not present

## 2021-04-29 DIAGNOSIS — E875 Hyperkalemia: Secondary | ICD-10-CM | POA: Diagnosis not present

## 2021-05-02 DIAGNOSIS — N186 End stage renal disease: Secondary | ICD-10-CM | POA: Diagnosis not present

## 2021-05-02 DIAGNOSIS — N2581 Secondary hyperparathyroidism of renal origin: Secondary | ICD-10-CM | POA: Diagnosis not present

## 2021-05-02 DIAGNOSIS — I953 Hypotension of hemodialysis: Secondary | ICD-10-CM | POA: Diagnosis not present

## 2021-05-02 DIAGNOSIS — D509 Iron deficiency anemia, unspecified: Secondary | ICD-10-CM | POA: Diagnosis not present

## 2021-05-02 DIAGNOSIS — D631 Anemia in chronic kidney disease: Secondary | ICD-10-CM | POA: Diagnosis not present

## 2021-05-02 DIAGNOSIS — Z992 Dependence on renal dialysis: Secondary | ICD-10-CM | POA: Diagnosis not present

## 2021-05-02 DIAGNOSIS — E875 Hyperkalemia: Secondary | ICD-10-CM | POA: Diagnosis not present

## 2021-05-03 DIAGNOSIS — D509 Iron deficiency anemia, unspecified: Secondary | ICD-10-CM | POA: Diagnosis not present

## 2021-05-03 DIAGNOSIS — I953 Hypotension of hemodialysis: Secondary | ICD-10-CM | POA: Diagnosis not present

## 2021-05-03 DIAGNOSIS — Z992 Dependence on renal dialysis: Secondary | ICD-10-CM | POA: Diagnosis not present

## 2021-05-03 DIAGNOSIS — E875 Hyperkalemia: Secondary | ICD-10-CM | POA: Diagnosis not present

## 2021-05-03 DIAGNOSIS — N186 End stage renal disease: Secondary | ICD-10-CM | POA: Diagnosis not present

## 2021-05-03 DIAGNOSIS — D631 Anemia in chronic kidney disease: Secondary | ICD-10-CM | POA: Diagnosis not present

## 2021-05-03 DIAGNOSIS — N2581 Secondary hyperparathyroidism of renal origin: Secondary | ICD-10-CM | POA: Diagnosis not present

## 2021-05-05 DIAGNOSIS — Z992 Dependence on renal dialysis: Secondary | ICD-10-CM | POA: Diagnosis not present

## 2021-05-05 DIAGNOSIS — D631 Anemia in chronic kidney disease: Secondary | ICD-10-CM | POA: Diagnosis not present

## 2021-05-05 DIAGNOSIS — I953 Hypotension of hemodialysis: Secondary | ICD-10-CM | POA: Diagnosis not present

## 2021-05-05 DIAGNOSIS — E875 Hyperkalemia: Secondary | ICD-10-CM | POA: Diagnosis not present

## 2021-05-05 DIAGNOSIS — N2581 Secondary hyperparathyroidism of renal origin: Secondary | ICD-10-CM | POA: Diagnosis not present

## 2021-05-05 DIAGNOSIS — D509 Iron deficiency anemia, unspecified: Secondary | ICD-10-CM | POA: Diagnosis not present

## 2021-05-05 DIAGNOSIS — N186 End stage renal disease: Secondary | ICD-10-CM | POA: Diagnosis not present

## 2021-05-06 DIAGNOSIS — I953 Hypotension of hemodialysis: Secondary | ICD-10-CM | POA: Diagnosis not present

## 2021-05-06 DIAGNOSIS — D509 Iron deficiency anemia, unspecified: Secondary | ICD-10-CM | POA: Diagnosis not present

## 2021-05-06 DIAGNOSIS — N2581 Secondary hyperparathyroidism of renal origin: Secondary | ICD-10-CM | POA: Diagnosis not present

## 2021-05-06 DIAGNOSIS — E875 Hyperkalemia: Secondary | ICD-10-CM | POA: Diagnosis not present

## 2021-05-06 DIAGNOSIS — D631 Anemia in chronic kidney disease: Secondary | ICD-10-CM | POA: Diagnosis not present

## 2021-05-06 DIAGNOSIS — N186 End stage renal disease: Secondary | ICD-10-CM | POA: Diagnosis not present

## 2021-05-06 DIAGNOSIS — Z992 Dependence on renal dialysis: Secondary | ICD-10-CM | POA: Diagnosis not present

## 2021-05-09 DIAGNOSIS — D509 Iron deficiency anemia, unspecified: Secondary | ICD-10-CM | POA: Diagnosis not present

## 2021-05-09 DIAGNOSIS — N2581 Secondary hyperparathyroidism of renal origin: Secondary | ICD-10-CM | POA: Diagnosis not present

## 2021-05-09 DIAGNOSIS — I953 Hypotension of hemodialysis: Secondary | ICD-10-CM | POA: Diagnosis not present

## 2021-05-09 DIAGNOSIS — Z992 Dependence on renal dialysis: Secondary | ICD-10-CM | POA: Diagnosis not present

## 2021-05-09 DIAGNOSIS — D631 Anemia in chronic kidney disease: Secondary | ICD-10-CM | POA: Diagnosis not present

## 2021-05-09 DIAGNOSIS — N186 End stage renal disease: Secondary | ICD-10-CM | POA: Diagnosis not present

## 2021-05-09 DIAGNOSIS — E875 Hyperkalemia: Secondary | ICD-10-CM | POA: Diagnosis not present

## 2021-05-10 DIAGNOSIS — Z992 Dependence on renal dialysis: Secondary | ICD-10-CM | POA: Diagnosis not present

## 2021-05-10 DIAGNOSIS — N186 End stage renal disease: Secondary | ICD-10-CM | POA: Diagnosis not present

## 2021-05-10 DIAGNOSIS — D631 Anemia in chronic kidney disease: Secondary | ICD-10-CM | POA: Diagnosis not present

## 2021-05-10 DIAGNOSIS — I953 Hypotension of hemodialysis: Secondary | ICD-10-CM | POA: Diagnosis not present

## 2021-05-10 DIAGNOSIS — N2581 Secondary hyperparathyroidism of renal origin: Secondary | ICD-10-CM | POA: Diagnosis not present

## 2021-05-10 DIAGNOSIS — D509 Iron deficiency anemia, unspecified: Secondary | ICD-10-CM | POA: Diagnosis not present

## 2021-05-10 DIAGNOSIS — E875 Hyperkalemia: Secondary | ICD-10-CM | POA: Diagnosis not present

## 2021-05-12 DIAGNOSIS — D509 Iron deficiency anemia, unspecified: Secondary | ICD-10-CM | POA: Diagnosis not present

## 2021-05-12 DIAGNOSIS — E875 Hyperkalemia: Secondary | ICD-10-CM | POA: Diagnosis not present

## 2021-05-12 DIAGNOSIS — D631 Anemia in chronic kidney disease: Secondary | ICD-10-CM | POA: Diagnosis not present

## 2021-05-12 DIAGNOSIS — N186 End stage renal disease: Secondary | ICD-10-CM | POA: Diagnosis not present

## 2021-05-12 DIAGNOSIS — N2581 Secondary hyperparathyroidism of renal origin: Secondary | ICD-10-CM | POA: Diagnosis not present

## 2021-05-12 DIAGNOSIS — Z992 Dependence on renal dialysis: Secondary | ICD-10-CM | POA: Diagnosis not present

## 2021-05-12 DIAGNOSIS — I953 Hypotension of hemodialysis: Secondary | ICD-10-CM | POA: Diagnosis not present

## 2021-05-13 DIAGNOSIS — E875 Hyperkalemia: Secondary | ICD-10-CM | POA: Diagnosis not present

## 2021-05-13 DIAGNOSIS — Z992 Dependence on renal dialysis: Secondary | ICD-10-CM | POA: Diagnosis not present

## 2021-05-13 DIAGNOSIS — I953 Hypotension of hemodialysis: Secondary | ICD-10-CM | POA: Diagnosis not present

## 2021-05-13 DIAGNOSIS — D631 Anemia in chronic kidney disease: Secondary | ICD-10-CM | POA: Diagnosis not present

## 2021-05-13 DIAGNOSIS — D509 Iron deficiency anemia, unspecified: Secondary | ICD-10-CM | POA: Diagnosis not present

## 2021-05-13 DIAGNOSIS — N186 End stage renal disease: Secondary | ICD-10-CM | POA: Diagnosis not present

## 2021-05-13 DIAGNOSIS — N2581 Secondary hyperparathyroidism of renal origin: Secondary | ICD-10-CM | POA: Diagnosis not present

## 2021-05-16 DIAGNOSIS — D631 Anemia in chronic kidney disease: Secondary | ICD-10-CM | POA: Diagnosis not present

## 2021-05-16 DIAGNOSIS — Z79899 Other long term (current) drug therapy: Secondary | ICD-10-CM | POA: Diagnosis not present

## 2021-05-16 DIAGNOSIS — N2581 Secondary hyperparathyroidism of renal origin: Secondary | ICD-10-CM | POA: Diagnosis not present

## 2021-05-16 DIAGNOSIS — N186 End stage renal disease: Secondary | ICD-10-CM | POA: Diagnosis not present

## 2021-05-16 DIAGNOSIS — Z992 Dependence on renal dialysis: Secondary | ICD-10-CM | POA: Diagnosis not present

## 2021-05-16 DIAGNOSIS — I953 Hypotension of hemodialysis: Secondary | ICD-10-CM | POA: Diagnosis not present

## 2021-05-16 DIAGNOSIS — M3210 Systemic lupus erythematosus, organ or system involvement unspecified: Secondary | ICD-10-CM | POA: Diagnosis not present

## 2021-05-16 DIAGNOSIS — D509 Iron deficiency anemia, unspecified: Secondary | ICD-10-CM | POA: Diagnosis not present

## 2021-05-16 DIAGNOSIS — E875 Hyperkalemia: Secondary | ICD-10-CM | POA: Diagnosis not present

## 2021-05-17 DIAGNOSIS — I953 Hypotension of hemodialysis: Secondary | ICD-10-CM | POA: Diagnosis not present

## 2021-05-17 DIAGNOSIS — Z992 Dependence on renal dialysis: Secondary | ICD-10-CM | POA: Diagnosis not present

## 2021-05-17 DIAGNOSIS — N2581 Secondary hyperparathyroidism of renal origin: Secondary | ICD-10-CM | POA: Diagnosis not present

## 2021-05-17 DIAGNOSIS — D509 Iron deficiency anemia, unspecified: Secondary | ICD-10-CM | POA: Diagnosis not present

## 2021-05-17 DIAGNOSIS — E875 Hyperkalemia: Secondary | ICD-10-CM | POA: Diagnosis not present

## 2021-05-17 DIAGNOSIS — D631 Anemia in chronic kidney disease: Secondary | ICD-10-CM | POA: Diagnosis not present

## 2021-05-17 DIAGNOSIS — N186 End stage renal disease: Secondary | ICD-10-CM | POA: Diagnosis not present

## 2021-05-18 DIAGNOSIS — M3219 Other organ or system involvement in systemic lupus erythematosus: Secondary | ICD-10-CM | POA: Diagnosis not present

## 2021-05-18 DIAGNOSIS — D509 Iron deficiency anemia, unspecified: Secondary | ICD-10-CM | POA: Diagnosis not present

## 2021-05-18 DIAGNOSIS — I509 Heart failure, unspecified: Secondary | ICD-10-CM | POA: Diagnosis not present

## 2021-05-18 DIAGNOSIS — N186 End stage renal disease: Secondary | ICD-10-CM | POA: Diagnosis not present

## 2021-05-18 DIAGNOSIS — I953 Hypotension of hemodialysis: Secondary | ICD-10-CM | POA: Diagnosis not present

## 2021-05-18 DIAGNOSIS — D631 Anemia in chronic kidney disease: Secondary | ICD-10-CM | POA: Diagnosis not present

## 2021-05-18 DIAGNOSIS — I272 Pulmonary hypertension, unspecified: Secondary | ICD-10-CM | POA: Diagnosis not present

## 2021-05-18 DIAGNOSIS — N2581 Secondary hyperparathyroidism of renal origin: Secondary | ICD-10-CM | POA: Diagnosis not present

## 2021-05-18 DIAGNOSIS — Z992 Dependence on renal dialysis: Secondary | ICD-10-CM | POA: Diagnosis not present

## 2021-05-18 DIAGNOSIS — E875 Hyperkalemia: Secondary | ICD-10-CM | POA: Diagnosis not present

## 2021-05-19 DIAGNOSIS — D631 Anemia in chronic kidney disease: Secondary | ICD-10-CM | POA: Diagnosis not present

## 2021-05-19 DIAGNOSIS — Z992 Dependence on renal dialysis: Secondary | ICD-10-CM | POA: Diagnosis not present

## 2021-05-19 DIAGNOSIS — E875 Hyperkalemia: Secondary | ICD-10-CM | POA: Diagnosis not present

## 2021-05-19 DIAGNOSIS — D509 Iron deficiency anemia, unspecified: Secondary | ICD-10-CM | POA: Diagnosis not present

## 2021-05-19 DIAGNOSIS — I953 Hypotension of hemodialysis: Secondary | ICD-10-CM | POA: Diagnosis not present

## 2021-05-19 DIAGNOSIS — N2581 Secondary hyperparathyroidism of renal origin: Secondary | ICD-10-CM | POA: Diagnosis not present

## 2021-05-19 DIAGNOSIS — N186 End stage renal disease: Secondary | ICD-10-CM | POA: Diagnosis not present

## 2021-05-20 DIAGNOSIS — N186 End stage renal disease: Secondary | ICD-10-CM | POA: Diagnosis not present

## 2021-05-20 DIAGNOSIS — Z992 Dependence on renal dialysis: Secondary | ICD-10-CM | POA: Diagnosis not present

## 2021-05-20 DIAGNOSIS — E875 Hyperkalemia: Secondary | ICD-10-CM | POA: Diagnosis not present

## 2021-05-20 DIAGNOSIS — N2581 Secondary hyperparathyroidism of renal origin: Secondary | ICD-10-CM | POA: Diagnosis not present

## 2021-05-20 DIAGNOSIS — D631 Anemia in chronic kidney disease: Secondary | ICD-10-CM | POA: Diagnosis not present

## 2021-05-20 DIAGNOSIS — D509 Iron deficiency anemia, unspecified: Secondary | ICD-10-CM | POA: Diagnosis not present

## 2021-05-20 DIAGNOSIS — I953 Hypotension of hemodialysis: Secondary | ICD-10-CM | POA: Diagnosis not present

## 2021-05-22 DIAGNOSIS — E875 Hyperkalemia: Secondary | ICD-10-CM | POA: Diagnosis not present

## 2021-05-22 DIAGNOSIS — N186 End stage renal disease: Secondary | ICD-10-CM | POA: Diagnosis not present

## 2021-05-22 DIAGNOSIS — N2581 Secondary hyperparathyroidism of renal origin: Secondary | ICD-10-CM | POA: Diagnosis not present

## 2021-05-22 DIAGNOSIS — D509 Iron deficiency anemia, unspecified: Secondary | ICD-10-CM | POA: Diagnosis not present

## 2021-05-22 DIAGNOSIS — I953 Hypotension of hemodialysis: Secondary | ICD-10-CM | POA: Diagnosis not present

## 2021-05-22 DIAGNOSIS — D631 Anemia in chronic kidney disease: Secondary | ICD-10-CM | POA: Diagnosis not present

## 2021-05-22 DIAGNOSIS — Z992 Dependence on renal dialysis: Secondary | ICD-10-CM | POA: Diagnosis not present

## 2021-05-23 DIAGNOSIS — E875 Hyperkalemia: Secondary | ICD-10-CM | POA: Diagnosis not present

## 2021-05-23 DIAGNOSIS — I953 Hypotension of hemodialysis: Secondary | ICD-10-CM | POA: Diagnosis not present

## 2021-05-23 DIAGNOSIS — Z992 Dependence on renal dialysis: Secondary | ICD-10-CM | POA: Diagnosis not present

## 2021-05-23 DIAGNOSIS — D509 Iron deficiency anemia, unspecified: Secondary | ICD-10-CM | POA: Diagnosis not present

## 2021-05-23 DIAGNOSIS — N2581 Secondary hyperparathyroidism of renal origin: Secondary | ICD-10-CM | POA: Diagnosis not present

## 2021-05-23 DIAGNOSIS — N186 End stage renal disease: Secondary | ICD-10-CM | POA: Diagnosis not present

## 2021-05-23 DIAGNOSIS — Z23 Encounter for immunization: Secondary | ICD-10-CM | POA: Diagnosis not present

## 2021-05-24 DIAGNOSIS — N186 End stage renal disease: Secondary | ICD-10-CM | POA: Diagnosis not present

## 2021-05-24 DIAGNOSIS — E875 Hyperkalemia: Secondary | ICD-10-CM | POA: Diagnosis not present

## 2021-05-24 DIAGNOSIS — N2581 Secondary hyperparathyroidism of renal origin: Secondary | ICD-10-CM | POA: Diagnosis not present

## 2021-05-24 DIAGNOSIS — Z23 Encounter for immunization: Secondary | ICD-10-CM | POA: Diagnosis not present

## 2021-05-24 DIAGNOSIS — D509 Iron deficiency anemia, unspecified: Secondary | ICD-10-CM | POA: Diagnosis not present

## 2021-05-24 DIAGNOSIS — I953 Hypotension of hemodialysis: Secondary | ICD-10-CM | POA: Diagnosis not present

## 2021-05-24 DIAGNOSIS — Z992 Dependence on renal dialysis: Secondary | ICD-10-CM | POA: Diagnosis not present

## 2021-05-25 DIAGNOSIS — D509 Iron deficiency anemia, unspecified: Secondary | ICD-10-CM | POA: Diagnosis not present

## 2021-05-25 DIAGNOSIS — Z23 Encounter for immunization: Secondary | ICD-10-CM | POA: Diagnosis not present

## 2021-05-25 DIAGNOSIS — N2581 Secondary hyperparathyroidism of renal origin: Secondary | ICD-10-CM | POA: Diagnosis not present

## 2021-05-25 DIAGNOSIS — I953 Hypotension of hemodialysis: Secondary | ICD-10-CM | POA: Diagnosis not present

## 2021-05-25 DIAGNOSIS — N186 End stage renal disease: Secondary | ICD-10-CM | POA: Diagnosis not present

## 2021-05-25 DIAGNOSIS — E875 Hyperkalemia: Secondary | ICD-10-CM | POA: Diagnosis not present

## 2021-05-25 DIAGNOSIS — Z992 Dependence on renal dialysis: Secondary | ICD-10-CM | POA: Diagnosis not present

## 2021-05-26 DIAGNOSIS — E875 Hyperkalemia: Secondary | ICD-10-CM | POA: Diagnosis not present

## 2021-05-26 DIAGNOSIS — Z992 Dependence on renal dialysis: Secondary | ICD-10-CM | POA: Diagnosis not present

## 2021-05-26 DIAGNOSIS — N186 End stage renal disease: Secondary | ICD-10-CM | POA: Diagnosis not present

## 2021-05-26 DIAGNOSIS — D509 Iron deficiency anemia, unspecified: Secondary | ICD-10-CM | POA: Diagnosis not present

## 2021-05-26 DIAGNOSIS — N2581 Secondary hyperparathyroidism of renal origin: Secondary | ICD-10-CM | POA: Diagnosis not present

## 2021-05-26 DIAGNOSIS — I953 Hypotension of hemodialysis: Secondary | ICD-10-CM | POA: Diagnosis not present

## 2021-05-26 DIAGNOSIS — Z23 Encounter for immunization: Secondary | ICD-10-CM | POA: Diagnosis not present

## 2021-05-27 DIAGNOSIS — I953 Hypotension of hemodialysis: Secondary | ICD-10-CM | POA: Diagnosis not present

## 2021-05-27 DIAGNOSIS — N2581 Secondary hyperparathyroidism of renal origin: Secondary | ICD-10-CM | POA: Diagnosis not present

## 2021-05-27 DIAGNOSIS — E875 Hyperkalemia: Secondary | ICD-10-CM | POA: Diagnosis not present

## 2021-05-27 DIAGNOSIS — D509 Iron deficiency anemia, unspecified: Secondary | ICD-10-CM | POA: Diagnosis not present

## 2021-05-27 DIAGNOSIS — Z992 Dependence on renal dialysis: Secondary | ICD-10-CM | POA: Diagnosis not present

## 2021-05-27 DIAGNOSIS — N186 End stage renal disease: Secondary | ICD-10-CM | POA: Diagnosis not present

## 2021-05-27 DIAGNOSIS — Z23 Encounter for immunization: Secondary | ICD-10-CM | POA: Diagnosis not present

## 2021-05-29 DIAGNOSIS — Z23 Encounter for immunization: Secondary | ICD-10-CM | POA: Diagnosis not present

## 2021-05-29 DIAGNOSIS — D509 Iron deficiency anemia, unspecified: Secondary | ICD-10-CM | POA: Diagnosis not present

## 2021-05-29 DIAGNOSIS — N186 End stage renal disease: Secondary | ICD-10-CM | POA: Diagnosis not present

## 2021-05-29 DIAGNOSIS — I953 Hypotension of hemodialysis: Secondary | ICD-10-CM | POA: Diagnosis not present

## 2021-05-29 DIAGNOSIS — N2581 Secondary hyperparathyroidism of renal origin: Secondary | ICD-10-CM | POA: Diagnosis not present

## 2021-05-29 DIAGNOSIS — E875 Hyperkalemia: Secondary | ICD-10-CM | POA: Diagnosis not present

## 2021-05-29 DIAGNOSIS — Z992 Dependence on renal dialysis: Secondary | ICD-10-CM | POA: Diagnosis not present

## 2021-05-30 DIAGNOSIS — Z992 Dependence on renal dialysis: Secondary | ICD-10-CM | POA: Diagnosis not present

## 2021-05-30 DIAGNOSIS — E875 Hyperkalemia: Secondary | ICD-10-CM | POA: Diagnosis not present

## 2021-05-30 DIAGNOSIS — N186 End stage renal disease: Secondary | ICD-10-CM | POA: Diagnosis not present

## 2021-05-30 DIAGNOSIS — D509 Iron deficiency anemia, unspecified: Secondary | ICD-10-CM | POA: Diagnosis not present

## 2021-05-30 DIAGNOSIS — N2581 Secondary hyperparathyroidism of renal origin: Secondary | ICD-10-CM | POA: Diagnosis not present

## 2021-05-30 DIAGNOSIS — I953 Hypotension of hemodialysis: Secondary | ICD-10-CM | POA: Diagnosis not present

## 2021-05-30 DIAGNOSIS — Z23 Encounter for immunization: Secondary | ICD-10-CM | POA: Diagnosis not present

## 2021-05-31 DIAGNOSIS — N2581 Secondary hyperparathyroidism of renal origin: Secondary | ICD-10-CM | POA: Diagnosis not present

## 2021-05-31 DIAGNOSIS — Z992 Dependence on renal dialysis: Secondary | ICD-10-CM | POA: Diagnosis not present

## 2021-05-31 DIAGNOSIS — E875 Hyperkalemia: Secondary | ICD-10-CM | POA: Diagnosis not present

## 2021-05-31 DIAGNOSIS — N186 End stage renal disease: Secondary | ICD-10-CM | POA: Diagnosis not present

## 2021-05-31 DIAGNOSIS — Z23 Encounter for immunization: Secondary | ICD-10-CM | POA: Diagnosis not present

## 2021-05-31 DIAGNOSIS — I953 Hypotension of hemodialysis: Secondary | ICD-10-CM | POA: Diagnosis not present

## 2021-05-31 DIAGNOSIS — D509 Iron deficiency anemia, unspecified: Secondary | ICD-10-CM | POA: Diagnosis not present

## 2021-06-01 DIAGNOSIS — Z23 Encounter for immunization: Secondary | ICD-10-CM | POA: Diagnosis not present

## 2021-06-01 DIAGNOSIS — E875 Hyperkalemia: Secondary | ICD-10-CM | POA: Diagnosis not present

## 2021-06-01 DIAGNOSIS — I953 Hypotension of hemodialysis: Secondary | ICD-10-CM | POA: Diagnosis not present

## 2021-06-01 DIAGNOSIS — D509 Iron deficiency anemia, unspecified: Secondary | ICD-10-CM | POA: Diagnosis not present

## 2021-06-01 DIAGNOSIS — N186 End stage renal disease: Secondary | ICD-10-CM | POA: Diagnosis not present

## 2021-06-01 DIAGNOSIS — Z992 Dependence on renal dialysis: Secondary | ICD-10-CM | POA: Diagnosis not present

## 2021-06-01 DIAGNOSIS — N2581 Secondary hyperparathyroidism of renal origin: Secondary | ICD-10-CM | POA: Diagnosis not present

## 2021-06-02 DIAGNOSIS — D509 Iron deficiency anemia, unspecified: Secondary | ICD-10-CM | POA: Diagnosis not present

## 2021-06-02 DIAGNOSIS — Z992 Dependence on renal dialysis: Secondary | ICD-10-CM | POA: Diagnosis not present

## 2021-06-02 DIAGNOSIS — I953 Hypotension of hemodialysis: Secondary | ICD-10-CM | POA: Diagnosis not present

## 2021-06-02 DIAGNOSIS — N186 End stage renal disease: Secondary | ICD-10-CM | POA: Diagnosis not present

## 2021-06-02 DIAGNOSIS — E875 Hyperkalemia: Secondary | ICD-10-CM | POA: Diagnosis not present

## 2021-06-02 DIAGNOSIS — Z23 Encounter for immunization: Secondary | ICD-10-CM | POA: Diagnosis not present

## 2021-06-02 DIAGNOSIS — N2581 Secondary hyperparathyroidism of renal origin: Secondary | ICD-10-CM | POA: Diagnosis not present

## 2021-06-03 DIAGNOSIS — Z23 Encounter for immunization: Secondary | ICD-10-CM | POA: Diagnosis not present

## 2021-06-03 DIAGNOSIS — N2581 Secondary hyperparathyroidism of renal origin: Secondary | ICD-10-CM | POA: Diagnosis not present

## 2021-06-03 DIAGNOSIS — N186 End stage renal disease: Secondary | ICD-10-CM | POA: Diagnosis not present

## 2021-06-03 DIAGNOSIS — Z992 Dependence on renal dialysis: Secondary | ICD-10-CM | POA: Diagnosis not present

## 2021-06-03 DIAGNOSIS — E875 Hyperkalemia: Secondary | ICD-10-CM | POA: Diagnosis not present

## 2021-06-03 DIAGNOSIS — I953 Hypotension of hemodialysis: Secondary | ICD-10-CM | POA: Diagnosis not present

## 2021-06-03 DIAGNOSIS — D509 Iron deficiency anemia, unspecified: Secondary | ICD-10-CM | POA: Diagnosis not present

## 2021-06-05 DIAGNOSIS — N186 End stage renal disease: Secondary | ICD-10-CM | POA: Diagnosis not present

## 2021-06-05 DIAGNOSIS — D509 Iron deficiency anemia, unspecified: Secondary | ICD-10-CM | POA: Diagnosis not present

## 2021-06-05 DIAGNOSIS — Z23 Encounter for immunization: Secondary | ICD-10-CM | POA: Diagnosis not present

## 2021-06-05 DIAGNOSIS — E875 Hyperkalemia: Secondary | ICD-10-CM | POA: Diagnosis not present

## 2021-06-05 DIAGNOSIS — Z992 Dependence on renal dialysis: Secondary | ICD-10-CM | POA: Diagnosis not present

## 2021-06-05 DIAGNOSIS — I953 Hypotension of hemodialysis: Secondary | ICD-10-CM | POA: Diagnosis not present

## 2021-06-05 DIAGNOSIS — N2581 Secondary hyperparathyroidism of renal origin: Secondary | ICD-10-CM | POA: Diagnosis not present

## 2021-06-06 DIAGNOSIS — D509 Iron deficiency anemia, unspecified: Secondary | ICD-10-CM | POA: Diagnosis not present

## 2021-06-06 DIAGNOSIS — I953 Hypotension of hemodialysis: Secondary | ICD-10-CM | POA: Diagnosis not present

## 2021-06-06 DIAGNOSIS — Z23 Encounter for immunization: Secondary | ICD-10-CM | POA: Diagnosis not present

## 2021-06-06 DIAGNOSIS — E875 Hyperkalemia: Secondary | ICD-10-CM | POA: Diagnosis not present

## 2021-06-06 DIAGNOSIS — Z992 Dependence on renal dialysis: Secondary | ICD-10-CM | POA: Diagnosis not present

## 2021-06-06 DIAGNOSIS — N186 End stage renal disease: Secondary | ICD-10-CM | POA: Diagnosis not present

## 2021-06-06 DIAGNOSIS — N2581 Secondary hyperparathyroidism of renal origin: Secondary | ICD-10-CM | POA: Diagnosis not present

## 2021-06-07 DIAGNOSIS — D509 Iron deficiency anemia, unspecified: Secondary | ICD-10-CM | POA: Diagnosis not present

## 2021-06-07 DIAGNOSIS — I953 Hypotension of hemodialysis: Secondary | ICD-10-CM | POA: Diagnosis not present

## 2021-06-07 DIAGNOSIS — N2581 Secondary hyperparathyroidism of renal origin: Secondary | ICD-10-CM | POA: Diagnosis not present

## 2021-06-07 DIAGNOSIS — E875 Hyperkalemia: Secondary | ICD-10-CM | POA: Diagnosis not present

## 2021-06-07 DIAGNOSIS — Z23 Encounter for immunization: Secondary | ICD-10-CM | POA: Diagnosis not present

## 2021-06-07 DIAGNOSIS — N186 End stage renal disease: Secondary | ICD-10-CM | POA: Diagnosis not present

## 2021-06-07 DIAGNOSIS — Z992 Dependence on renal dialysis: Secondary | ICD-10-CM | POA: Diagnosis not present

## 2021-06-08 DIAGNOSIS — N2581 Secondary hyperparathyroidism of renal origin: Secondary | ICD-10-CM | POA: Diagnosis not present

## 2021-06-08 DIAGNOSIS — Z992 Dependence on renal dialysis: Secondary | ICD-10-CM | POA: Diagnosis not present

## 2021-06-08 DIAGNOSIS — N186 End stage renal disease: Secondary | ICD-10-CM | POA: Diagnosis not present

## 2021-06-08 DIAGNOSIS — Z23 Encounter for immunization: Secondary | ICD-10-CM | POA: Diagnosis not present

## 2021-06-08 DIAGNOSIS — E875 Hyperkalemia: Secondary | ICD-10-CM | POA: Diagnosis not present

## 2021-06-08 DIAGNOSIS — D509 Iron deficiency anemia, unspecified: Secondary | ICD-10-CM | POA: Diagnosis not present

## 2021-06-08 DIAGNOSIS — I953 Hypotension of hemodialysis: Secondary | ICD-10-CM | POA: Diagnosis not present

## 2021-06-09 DIAGNOSIS — E875 Hyperkalemia: Secondary | ICD-10-CM | POA: Diagnosis not present

## 2021-06-09 DIAGNOSIS — I953 Hypotension of hemodialysis: Secondary | ICD-10-CM | POA: Diagnosis not present

## 2021-06-09 DIAGNOSIS — N186 End stage renal disease: Secondary | ICD-10-CM | POA: Diagnosis not present

## 2021-06-09 DIAGNOSIS — D509 Iron deficiency anemia, unspecified: Secondary | ICD-10-CM | POA: Diagnosis not present

## 2021-06-09 DIAGNOSIS — Z23 Encounter for immunization: Secondary | ICD-10-CM | POA: Diagnosis not present

## 2021-06-09 DIAGNOSIS — N2581 Secondary hyperparathyroidism of renal origin: Secondary | ICD-10-CM | POA: Diagnosis not present

## 2021-06-09 DIAGNOSIS — Z992 Dependence on renal dialysis: Secondary | ICD-10-CM | POA: Diagnosis not present

## 2021-06-10 DIAGNOSIS — N2581 Secondary hyperparathyroidism of renal origin: Secondary | ICD-10-CM | POA: Diagnosis not present

## 2021-06-10 DIAGNOSIS — N186 End stage renal disease: Secondary | ICD-10-CM | POA: Diagnosis not present

## 2021-06-10 DIAGNOSIS — E875 Hyperkalemia: Secondary | ICD-10-CM | POA: Diagnosis not present

## 2021-06-10 DIAGNOSIS — D509 Iron deficiency anemia, unspecified: Secondary | ICD-10-CM | POA: Diagnosis not present

## 2021-06-10 DIAGNOSIS — I953 Hypotension of hemodialysis: Secondary | ICD-10-CM | POA: Diagnosis not present

## 2021-06-10 DIAGNOSIS — Z23 Encounter for immunization: Secondary | ICD-10-CM | POA: Diagnosis not present

## 2021-06-10 DIAGNOSIS — Z992 Dependence on renal dialysis: Secondary | ICD-10-CM | POA: Diagnosis not present

## 2021-06-12 DIAGNOSIS — N189 Chronic kidney disease, unspecified: Secondary | ICD-10-CM | POA: Diagnosis not present

## 2021-06-12 DIAGNOSIS — R0789 Other chest pain: Secondary | ICD-10-CM | POA: Diagnosis not present

## 2021-06-12 DIAGNOSIS — Z888 Allergy status to other drugs, medicaments and biological substances status: Secondary | ICD-10-CM | POA: Diagnosis not present

## 2021-06-12 DIAGNOSIS — R072 Precordial pain: Secondary | ICD-10-CM | POA: Diagnosis not present

## 2021-06-12 DIAGNOSIS — R079 Chest pain, unspecified: Secondary | ICD-10-CM | POA: Diagnosis not present

## 2021-06-12 DIAGNOSIS — D696 Thrombocytopenia, unspecified: Secondary | ICD-10-CM | POA: Diagnosis not present

## 2021-06-12 DIAGNOSIS — I272 Pulmonary hypertension, unspecified: Secondary | ICD-10-CM | POA: Diagnosis not present

## 2021-06-12 DIAGNOSIS — Z79899 Other long term (current) drug therapy: Secondary | ICD-10-CM | POA: Diagnosis not present

## 2021-06-12 DIAGNOSIS — M329 Systemic lupus erythematosus, unspecified: Secondary | ICD-10-CM | POA: Diagnosis not present

## 2021-06-13 DIAGNOSIS — Z23 Encounter for immunization: Secondary | ICD-10-CM | POA: Diagnosis not present

## 2021-06-13 DIAGNOSIS — D509 Iron deficiency anemia, unspecified: Secondary | ICD-10-CM | POA: Diagnosis not present

## 2021-06-13 DIAGNOSIS — E875 Hyperkalemia: Secondary | ICD-10-CM | POA: Diagnosis not present

## 2021-06-13 DIAGNOSIS — N2581 Secondary hyperparathyroidism of renal origin: Secondary | ICD-10-CM | POA: Diagnosis not present

## 2021-06-13 DIAGNOSIS — I953 Hypotension of hemodialysis: Secondary | ICD-10-CM | POA: Diagnosis not present

## 2021-06-13 DIAGNOSIS — Z992 Dependence on renal dialysis: Secondary | ICD-10-CM | POA: Diagnosis not present

## 2021-06-13 DIAGNOSIS — N186 End stage renal disease: Secondary | ICD-10-CM | POA: Diagnosis not present

## 2021-06-14 DIAGNOSIS — I953 Hypotension of hemodialysis: Secondary | ICD-10-CM | POA: Diagnosis not present

## 2021-06-14 DIAGNOSIS — Z23 Encounter for immunization: Secondary | ICD-10-CM | POA: Diagnosis not present

## 2021-06-14 DIAGNOSIS — Z992 Dependence on renal dialysis: Secondary | ICD-10-CM | POA: Diagnosis not present

## 2021-06-14 DIAGNOSIS — D509 Iron deficiency anemia, unspecified: Secondary | ICD-10-CM | POA: Diagnosis not present

## 2021-06-14 DIAGNOSIS — N186 End stage renal disease: Secondary | ICD-10-CM | POA: Diagnosis not present

## 2021-06-14 DIAGNOSIS — E875 Hyperkalemia: Secondary | ICD-10-CM | POA: Diagnosis not present

## 2021-06-14 DIAGNOSIS — N2581 Secondary hyperparathyroidism of renal origin: Secondary | ICD-10-CM | POA: Diagnosis not present

## 2021-06-16 DIAGNOSIS — Z23 Encounter for immunization: Secondary | ICD-10-CM | POA: Diagnosis not present

## 2021-06-16 DIAGNOSIS — N2581 Secondary hyperparathyroidism of renal origin: Secondary | ICD-10-CM | POA: Diagnosis not present

## 2021-06-16 DIAGNOSIS — E875 Hyperkalemia: Secondary | ICD-10-CM | POA: Diagnosis not present

## 2021-06-16 DIAGNOSIS — N186 End stage renal disease: Secondary | ICD-10-CM | POA: Diagnosis not present

## 2021-06-16 DIAGNOSIS — Z992 Dependence on renal dialysis: Secondary | ICD-10-CM | POA: Diagnosis not present

## 2021-06-16 DIAGNOSIS — D509 Iron deficiency anemia, unspecified: Secondary | ICD-10-CM | POA: Diagnosis not present

## 2021-06-16 DIAGNOSIS — I953 Hypotension of hemodialysis: Secondary | ICD-10-CM | POA: Diagnosis not present

## 2021-06-17 DIAGNOSIS — Z23 Encounter for immunization: Secondary | ICD-10-CM | POA: Diagnosis not present

## 2021-06-17 DIAGNOSIS — Z992 Dependence on renal dialysis: Secondary | ICD-10-CM | POA: Diagnosis not present

## 2021-06-17 DIAGNOSIS — I953 Hypotension of hemodialysis: Secondary | ICD-10-CM | POA: Diagnosis not present

## 2021-06-17 DIAGNOSIS — E875 Hyperkalemia: Secondary | ICD-10-CM | POA: Diagnosis not present

## 2021-06-17 DIAGNOSIS — D509 Iron deficiency anemia, unspecified: Secondary | ICD-10-CM | POA: Diagnosis not present

## 2021-06-17 DIAGNOSIS — N2581 Secondary hyperparathyroidism of renal origin: Secondary | ICD-10-CM | POA: Diagnosis not present

## 2021-06-17 DIAGNOSIS — N186 End stage renal disease: Secondary | ICD-10-CM | POA: Diagnosis not present

## 2021-06-18 DIAGNOSIS — I272 Pulmonary hypertension, unspecified: Secondary | ICD-10-CM | POA: Diagnosis not present

## 2021-06-18 DIAGNOSIS — I509 Heart failure, unspecified: Secondary | ICD-10-CM | POA: Diagnosis not present

## 2021-06-18 DIAGNOSIS — M3219 Other organ or system involvement in systemic lupus erythematosus: Secondary | ICD-10-CM | POA: Diagnosis not present

## 2021-06-20 DIAGNOSIS — D509 Iron deficiency anemia, unspecified: Secondary | ICD-10-CM | POA: Diagnosis not present

## 2021-06-20 DIAGNOSIS — E875 Hyperkalemia: Secondary | ICD-10-CM | POA: Diagnosis not present

## 2021-06-20 DIAGNOSIS — N2581 Secondary hyperparathyroidism of renal origin: Secondary | ICD-10-CM | POA: Diagnosis not present

## 2021-06-20 DIAGNOSIS — Z23 Encounter for immunization: Secondary | ICD-10-CM | POA: Diagnosis not present

## 2021-06-20 DIAGNOSIS — N186 End stage renal disease: Secondary | ICD-10-CM | POA: Diagnosis not present

## 2021-06-20 DIAGNOSIS — I953 Hypotension of hemodialysis: Secondary | ICD-10-CM | POA: Diagnosis not present

## 2021-06-20 DIAGNOSIS — Z992 Dependence on renal dialysis: Secondary | ICD-10-CM | POA: Diagnosis not present

## 2021-06-21 DIAGNOSIS — Z23 Encounter for immunization: Secondary | ICD-10-CM | POA: Diagnosis not present

## 2021-06-21 DIAGNOSIS — D509 Iron deficiency anemia, unspecified: Secondary | ICD-10-CM | POA: Diagnosis not present

## 2021-06-21 DIAGNOSIS — N186 End stage renal disease: Secondary | ICD-10-CM | POA: Diagnosis not present

## 2021-06-21 DIAGNOSIS — Z992 Dependence on renal dialysis: Secondary | ICD-10-CM | POA: Diagnosis not present

## 2021-06-21 DIAGNOSIS — E875 Hyperkalemia: Secondary | ICD-10-CM | POA: Diagnosis not present

## 2021-06-21 DIAGNOSIS — N2581 Secondary hyperparathyroidism of renal origin: Secondary | ICD-10-CM | POA: Diagnosis not present

## 2021-06-21 DIAGNOSIS — I953 Hypotension of hemodialysis: Secondary | ICD-10-CM | POA: Diagnosis not present

## 2021-06-22 DIAGNOSIS — Z992 Dependence on renal dialysis: Secondary | ICD-10-CM | POA: Diagnosis not present

## 2021-06-22 DIAGNOSIS — M329 Systemic lupus erythematosus, unspecified: Secondary | ICD-10-CM | POA: Diagnosis not present

## 2021-06-22 DIAGNOSIS — N186 End stage renal disease: Secondary | ICD-10-CM | POA: Diagnosis not present

## 2021-06-22 DIAGNOSIS — R091 Pleurisy: Secondary | ICD-10-CM | POA: Diagnosis not present

## 2021-06-22 DIAGNOSIS — M3219 Other organ or system involvement in systemic lupus erythematosus: Secondary | ICD-10-CM | POA: Diagnosis not present

## 2021-06-23 DIAGNOSIS — Z992 Dependence on renal dialysis: Secondary | ICD-10-CM | POA: Diagnosis not present

## 2021-06-23 DIAGNOSIS — Z23 Encounter for immunization: Secondary | ICD-10-CM | POA: Diagnosis not present

## 2021-06-23 DIAGNOSIS — E875 Hyperkalemia: Secondary | ICD-10-CM | POA: Diagnosis not present

## 2021-06-23 DIAGNOSIS — N186 End stage renal disease: Secondary | ICD-10-CM | POA: Diagnosis not present

## 2021-06-23 DIAGNOSIS — D631 Anemia in chronic kidney disease: Secondary | ICD-10-CM | POA: Diagnosis not present

## 2021-06-23 DIAGNOSIS — D509 Iron deficiency anemia, unspecified: Secondary | ICD-10-CM | POA: Diagnosis not present

## 2021-06-23 DIAGNOSIS — N2581 Secondary hyperparathyroidism of renal origin: Secondary | ICD-10-CM | POA: Diagnosis not present

## 2021-06-23 DIAGNOSIS — I953 Hypotension of hemodialysis: Secondary | ICD-10-CM | POA: Diagnosis not present

## 2021-06-24 DIAGNOSIS — D631 Anemia in chronic kidney disease: Secondary | ICD-10-CM | POA: Diagnosis not present

## 2021-06-24 DIAGNOSIS — I953 Hypotension of hemodialysis: Secondary | ICD-10-CM | POA: Diagnosis not present

## 2021-06-24 DIAGNOSIS — D509 Iron deficiency anemia, unspecified: Secondary | ICD-10-CM | POA: Diagnosis not present

## 2021-06-24 DIAGNOSIS — N2581 Secondary hyperparathyroidism of renal origin: Secondary | ICD-10-CM | POA: Diagnosis not present

## 2021-06-24 DIAGNOSIS — Z992 Dependence on renal dialysis: Secondary | ICD-10-CM | POA: Diagnosis not present

## 2021-06-24 DIAGNOSIS — Z23 Encounter for immunization: Secondary | ICD-10-CM | POA: Diagnosis not present

## 2021-06-24 DIAGNOSIS — N186 End stage renal disease: Secondary | ICD-10-CM | POA: Diagnosis not present

## 2021-06-24 DIAGNOSIS — E875 Hyperkalemia: Secondary | ICD-10-CM | POA: Diagnosis not present

## 2021-06-27 DIAGNOSIS — I953 Hypotension of hemodialysis: Secondary | ICD-10-CM | POA: Diagnosis not present

## 2021-06-27 DIAGNOSIS — D631 Anemia in chronic kidney disease: Secondary | ICD-10-CM | POA: Diagnosis not present

## 2021-06-27 DIAGNOSIS — Z23 Encounter for immunization: Secondary | ICD-10-CM | POA: Diagnosis not present

## 2021-06-27 DIAGNOSIS — E875 Hyperkalemia: Secondary | ICD-10-CM | POA: Diagnosis not present

## 2021-06-27 DIAGNOSIS — D509 Iron deficiency anemia, unspecified: Secondary | ICD-10-CM | POA: Diagnosis not present

## 2021-06-27 DIAGNOSIS — N2581 Secondary hyperparathyroidism of renal origin: Secondary | ICD-10-CM | POA: Diagnosis not present

## 2021-06-27 DIAGNOSIS — Z992 Dependence on renal dialysis: Secondary | ICD-10-CM | POA: Diagnosis not present

## 2021-06-27 DIAGNOSIS — N186 End stage renal disease: Secondary | ICD-10-CM | POA: Diagnosis not present

## 2021-06-28 DIAGNOSIS — D509 Iron deficiency anemia, unspecified: Secondary | ICD-10-CM | POA: Diagnosis not present

## 2021-06-28 DIAGNOSIS — Z992 Dependence on renal dialysis: Secondary | ICD-10-CM | POA: Diagnosis not present

## 2021-06-28 DIAGNOSIS — D631 Anemia in chronic kidney disease: Secondary | ICD-10-CM | POA: Diagnosis not present

## 2021-06-28 DIAGNOSIS — E875 Hyperkalemia: Secondary | ICD-10-CM | POA: Diagnosis not present

## 2021-06-28 DIAGNOSIS — N2581 Secondary hyperparathyroidism of renal origin: Secondary | ICD-10-CM | POA: Diagnosis not present

## 2021-06-28 DIAGNOSIS — I953 Hypotension of hemodialysis: Secondary | ICD-10-CM | POA: Diagnosis not present

## 2021-06-28 DIAGNOSIS — Z23 Encounter for immunization: Secondary | ICD-10-CM | POA: Diagnosis not present

## 2021-06-28 DIAGNOSIS — N186 End stage renal disease: Secondary | ICD-10-CM | POA: Diagnosis not present

## 2021-06-30 DIAGNOSIS — D509 Iron deficiency anemia, unspecified: Secondary | ICD-10-CM | POA: Diagnosis not present

## 2021-06-30 DIAGNOSIS — Z992 Dependence on renal dialysis: Secondary | ICD-10-CM | POA: Diagnosis not present

## 2021-06-30 DIAGNOSIS — Z23 Encounter for immunization: Secondary | ICD-10-CM | POA: Diagnosis not present

## 2021-06-30 DIAGNOSIS — N186 End stage renal disease: Secondary | ICD-10-CM | POA: Diagnosis not present

## 2021-06-30 DIAGNOSIS — D631 Anemia in chronic kidney disease: Secondary | ICD-10-CM | POA: Diagnosis not present

## 2021-06-30 DIAGNOSIS — E875 Hyperkalemia: Secondary | ICD-10-CM | POA: Diagnosis not present

## 2021-06-30 DIAGNOSIS — I953 Hypotension of hemodialysis: Secondary | ICD-10-CM | POA: Diagnosis not present

## 2021-06-30 DIAGNOSIS — N2581 Secondary hyperparathyroidism of renal origin: Secondary | ICD-10-CM | POA: Diagnosis not present

## 2021-07-01 DIAGNOSIS — E875 Hyperkalemia: Secondary | ICD-10-CM | POA: Diagnosis not present

## 2021-07-01 DIAGNOSIS — D631 Anemia in chronic kidney disease: Secondary | ICD-10-CM | POA: Diagnosis not present

## 2021-07-01 DIAGNOSIS — N186 End stage renal disease: Secondary | ICD-10-CM | POA: Diagnosis not present

## 2021-07-01 DIAGNOSIS — N2581 Secondary hyperparathyroidism of renal origin: Secondary | ICD-10-CM | POA: Diagnosis not present

## 2021-07-01 DIAGNOSIS — Z23 Encounter for immunization: Secondary | ICD-10-CM | POA: Diagnosis not present

## 2021-07-01 DIAGNOSIS — D509 Iron deficiency anemia, unspecified: Secondary | ICD-10-CM | POA: Diagnosis not present

## 2021-07-01 DIAGNOSIS — I953 Hypotension of hemodialysis: Secondary | ICD-10-CM | POA: Diagnosis not present

## 2021-07-01 DIAGNOSIS — Z992 Dependence on renal dialysis: Secondary | ICD-10-CM | POA: Diagnosis not present

## 2021-07-04 DIAGNOSIS — I953 Hypotension of hemodialysis: Secondary | ICD-10-CM | POA: Diagnosis not present

## 2021-07-04 DIAGNOSIS — D631 Anemia in chronic kidney disease: Secondary | ICD-10-CM | POA: Diagnosis not present

## 2021-07-04 DIAGNOSIS — N186 End stage renal disease: Secondary | ICD-10-CM | POA: Diagnosis not present

## 2021-07-04 DIAGNOSIS — N2581 Secondary hyperparathyroidism of renal origin: Secondary | ICD-10-CM | POA: Diagnosis not present

## 2021-07-04 DIAGNOSIS — Z992 Dependence on renal dialysis: Secondary | ICD-10-CM | POA: Diagnosis not present

## 2021-07-04 DIAGNOSIS — D509 Iron deficiency anemia, unspecified: Secondary | ICD-10-CM | POA: Diagnosis not present

## 2021-07-04 DIAGNOSIS — Z23 Encounter for immunization: Secondary | ICD-10-CM | POA: Diagnosis not present

## 2021-07-04 DIAGNOSIS — E875 Hyperkalemia: Secondary | ICD-10-CM | POA: Diagnosis not present

## 2021-07-05 DIAGNOSIS — I953 Hypotension of hemodialysis: Secondary | ICD-10-CM | POA: Diagnosis not present

## 2021-07-05 DIAGNOSIS — E875 Hyperkalemia: Secondary | ICD-10-CM | POA: Diagnosis not present

## 2021-07-05 DIAGNOSIS — D631 Anemia in chronic kidney disease: Secondary | ICD-10-CM | POA: Diagnosis not present

## 2021-07-05 DIAGNOSIS — D509 Iron deficiency anemia, unspecified: Secondary | ICD-10-CM | POA: Diagnosis not present

## 2021-07-05 DIAGNOSIS — N2581 Secondary hyperparathyroidism of renal origin: Secondary | ICD-10-CM | POA: Diagnosis not present

## 2021-07-05 DIAGNOSIS — N186 End stage renal disease: Secondary | ICD-10-CM | POA: Diagnosis not present

## 2021-07-05 DIAGNOSIS — Z992 Dependence on renal dialysis: Secondary | ICD-10-CM | POA: Diagnosis not present

## 2021-07-05 DIAGNOSIS — Z23 Encounter for immunization: Secondary | ICD-10-CM | POA: Diagnosis not present

## 2021-07-07 DIAGNOSIS — N186 End stage renal disease: Secondary | ICD-10-CM | POA: Diagnosis not present

## 2021-07-07 DIAGNOSIS — D509 Iron deficiency anemia, unspecified: Secondary | ICD-10-CM | POA: Diagnosis not present

## 2021-07-07 DIAGNOSIS — E875 Hyperkalemia: Secondary | ICD-10-CM | POA: Diagnosis not present

## 2021-07-07 DIAGNOSIS — D631 Anemia in chronic kidney disease: Secondary | ICD-10-CM | POA: Diagnosis not present

## 2021-07-07 DIAGNOSIS — Z23 Encounter for immunization: Secondary | ICD-10-CM | POA: Diagnosis not present

## 2021-07-07 DIAGNOSIS — N2581 Secondary hyperparathyroidism of renal origin: Secondary | ICD-10-CM | POA: Diagnosis not present

## 2021-07-07 DIAGNOSIS — Z992 Dependence on renal dialysis: Secondary | ICD-10-CM | POA: Diagnosis not present

## 2021-07-07 DIAGNOSIS — I953 Hypotension of hemodialysis: Secondary | ICD-10-CM | POA: Diagnosis not present

## 2021-07-08 DIAGNOSIS — E875 Hyperkalemia: Secondary | ICD-10-CM | POA: Diagnosis not present

## 2021-07-08 DIAGNOSIS — Z992 Dependence on renal dialysis: Secondary | ICD-10-CM | POA: Diagnosis not present

## 2021-07-08 DIAGNOSIS — Z23 Encounter for immunization: Secondary | ICD-10-CM | POA: Diagnosis not present

## 2021-07-08 DIAGNOSIS — I953 Hypotension of hemodialysis: Secondary | ICD-10-CM | POA: Diagnosis not present

## 2021-07-08 DIAGNOSIS — D631 Anemia in chronic kidney disease: Secondary | ICD-10-CM | POA: Diagnosis not present

## 2021-07-08 DIAGNOSIS — N186 End stage renal disease: Secondary | ICD-10-CM | POA: Diagnosis not present

## 2021-07-08 DIAGNOSIS — D509 Iron deficiency anemia, unspecified: Secondary | ICD-10-CM | POA: Diagnosis not present

## 2021-07-08 DIAGNOSIS — N2581 Secondary hyperparathyroidism of renal origin: Secondary | ICD-10-CM | POA: Diagnosis not present

## 2021-07-11 DIAGNOSIS — D631 Anemia in chronic kidney disease: Secondary | ICD-10-CM | POA: Diagnosis not present

## 2021-07-11 DIAGNOSIS — E875 Hyperkalemia: Secondary | ICD-10-CM | POA: Diagnosis not present

## 2021-07-11 DIAGNOSIS — I953 Hypotension of hemodialysis: Secondary | ICD-10-CM | POA: Diagnosis not present

## 2021-07-11 DIAGNOSIS — N2581 Secondary hyperparathyroidism of renal origin: Secondary | ICD-10-CM | POA: Diagnosis not present

## 2021-07-11 DIAGNOSIS — Z992 Dependence on renal dialysis: Secondary | ICD-10-CM | POA: Diagnosis not present

## 2021-07-11 DIAGNOSIS — Z23 Encounter for immunization: Secondary | ICD-10-CM | POA: Diagnosis not present

## 2021-07-11 DIAGNOSIS — N186 End stage renal disease: Secondary | ICD-10-CM | POA: Diagnosis not present

## 2021-07-11 DIAGNOSIS — D509 Iron deficiency anemia, unspecified: Secondary | ICD-10-CM | POA: Diagnosis not present

## 2021-07-12 DIAGNOSIS — D509 Iron deficiency anemia, unspecified: Secondary | ICD-10-CM | POA: Diagnosis not present

## 2021-07-12 DIAGNOSIS — N186 End stage renal disease: Secondary | ICD-10-CM | POA: Diagnosis not present

## 2021-07-12 DIAGNOSIS — E875 Hyperkalemia: Secondary | ICD-10-CM | POA: Diagnosis not present

## 2021-07-12 DIAGNOSIS — N2581 Secondary hyperparathyroidism of renal origin: Secondary | ICD-10-CM | POA: Diagnosis not present

## 2021-07-12 DIAGNOSIS — I953 Hypotension of hemodialysis: Secondary | ICD-10-CM | POA: Diagnosis not present

## 2021-07-12 DIAGNOSIS — Z23 Encounter for immunization: Secondary | ICD-10-CM | POA: Diagnosis not present

## 2021-07-12 DIAGNOSIS — M3219 Other organ or system involvement in systemic lupus erythematosus: Secondary | ICD-10-CM | POA: Diagnosis not present

## 2021-07-12 DIAGNOSIS — D631 Anemia in chronic kidney disease: Secondary | ICD-10-CM | POA: Diagnosis not present

## 2021-07-12 DIAGNOSIS — Z992 Dependence on renal dialysis: Secondary | ICD-10-CM | POA: Diagnosis not present

## 2021-07-14 DIAGNOSIS — I953 Hypotension of hemodialysis: Secondary | ICD-10-CM | POA: Diagnosis not present

## 2021-07-14 DIAGNOSIS — Z23 Encounter for immunization: Secondary | ICD-10-CM | POA: Diagnosis not present

## 2021-07-14 DIAGNOSIS — D509 Iron deficiency anemia, unspecified: Secondary | ICD-10-CM | POA: Diagnosis not present

## 2021-07-14 DIAGNOSIS — D631 Anemia in chronic kidney disease: Secondary | ICD-10-CM | POA: Diagnosis not present

## 2021-07-14 DIAGNOSIS — E875 Hyperkalemia: Secondary | ICD-10-CM | POA: Diagnosis not present

## 2021-07-14 DIAGNOSIS — Z992 Dependence on renal dialysis: Secondary | ICD-10-CM | POA: Diagnosis not present

## 2021-07-14 DIAGNOSIS — N186 End stage renal disease: Secondary | ICD-10-CM | POA: Diagnosis not present

## 2021-07-14 DIAGNOSIS — N2581 Secondary hyperparathyroidism of renal origin: Secondary | ICD-10-CM | POA: Diagnosis not present

## 2021-07-15 DIAGNOSIS — D631 Anemia in chronic kidney disease: Secondary | ICD-10-CM | POA: Diagnosis not present

## 2021-07-15 DIAGNOSIS — Z992 Dependence on renal dialysis: Secondary | ICD-10-CM | POA: Diagnosis not present

## 2021-07-15 DIAGNOSIS — N2581 Secondary hyperparathyroidism of renal origin: Secondary | ICD-10-CM | POA: Diagnosis not present

## 2021-07-15 DIAGNOSIS — Z23 Encounter for immunization: Secondary | ICD-10-CM | POA: Diagnosis not present

## 2021-07-15 DIAGNOSIS — D509 Iron deficiency anemia, unspecified: Secondary | ICD-10-CM | POA: Diagnosis not present

## 2021-07-15 DIAGNOSIS — I953 Hypotension of hemodialysis: Secondary | ICD-10-CM | POA: Diagnosis not present

## 2021-07-15 DIAGNOSIS — E875 Hyperkalemia: Secondary | ICD-10-CM | POA: Diagnosis not present

## 2021-07-15 DIAGNOSIS — N186 End stage renal disease: Secondary | ICD-10-CM | POA: Diagnosis not present

## 2021-07-18 DIAGNOSIS — Z23 Encounter for immunization: Secondary | ICD-10-CM | POA: Diagnosis not present

## 2021-07-18 DIAGNOSIS — D631 Anemia in chronic kidney disease: Secondary | ICD-10-CM | POA: Diagnosis not present

## 2021-07-18 DIAGNOSIS — N2581 Secondary hyperparathyroidism of renal origin: Secondary | ICD-10-CM | POA: Diagnosis not present

## 2021-07-18 DIAGNOSIS — I953 Hypotension of hemodialysis: Secondary | ICD-10-CM | POA: Diagnosis not present

## 2021-07-18 DIAGNOSIS — N186 End stage renal disease: Secondary | ICD-10-CM | POA: Diagnosis not present

## 2021-07-18 DIAGNOSIS — D509 Iron deficiency anemia, unspecified: Secondary | ICD-10-CM | POA: Diagnosis not present

## 2021-07-18 DIAGNOSIS — Z992 Dependence on renal dialysis: Secondary | ICD-10-CM | POA: Diagnosis not present

## 2021-07-18 DIAGNOSIS — E875 Hyperkalemia: Secondary | ICD-10-CM | POA: Diagnosis not present

## 2021-07-19 DIAGNOSIS — Z23 Encounter for immunization: Secondary | ICD-10-CM | POA: Diagnosis not present

## 2021-07-19 DIAGNOSIS — D509 Iron deficiency anemia, unspecified: Secondary | ICD-10-CM | POA: Diagnosis not present

## 2021-07-19 DIAGNOSIS — Z992 Dependence on renal dialysis: Secondary | ICD-10-CM | POA: Diagnosis not present

## 2021-07-19 DIAGNOSIS — M3219 Other organ or system involvement in systemic lupus erythematosus: Secondary | ICD-10-CM | POA: Diagnosis not present

## 2021-07-19 DIAGNOSIS — I509 Heart failure, unspecified: Secondary | ICD-10-CM | POA: Diagnosis not present

## 2021-07-19 DIAGNOSIS — N2581 Secondary hyperparathyroidism of renal origin: Secondary | ICD-10-CM | POA: Diagnosis not present

## 2021-07-19 DIAGNOSIS — N186 End stage renal disease: Secondary | ICD-10-CM | POA: Diagnosis not present

## 2021-07-19 DIAGNOSIS — I953 Hypotension of hemodialysis: Secondary | ICD-10-CM | POA: Diagnosis not present

## 2021-07-19 DIAGNOSIS — D631 Anemia in chronic kidney disease: Secondary | ICD-10-CM | POA: Diagnosis not present

## 2021-07-19 DIAGNOSIS — E875 Hyperkalemia: Secondary | ICD-10-CM | POA: Diagnosis not present

## 2021-07-19 DIAGNOSIS — I272 Pulmonary hypertension, unspecified: Secondary | ICD-10-CM | POA: Diagnosis not present

## 2021-07-21 DIAGNOSIS — N2581 Secondary hyperparathyroidism of renal origin: Secondary | ICD-10-CM | POA: Diagnosis not present

## 2021-07-21 DIAGNOSIS — E875 Hyperkalemia: Secondary | ICD-10-CM | POA: Diagnosis not present

## 2021-07-21 DIAGNOSIS — Z23 Encounter for immunization: Secondary | ICD-10-CM | POA: Diagnosis not present

## 2021-07-21 DIAGNOSIS — D509 Iron deficiency anemia, unspecified: Secondary | ICD-10-CM | POA: Diagnosis not present

## 2021-07-21 DIAGNOSIS — N186 End stage renal disease: Secondary | ICD-10-CM | POA: Diagnosis not present

## 2021-07-21 DIAGNOSIS — I953 Hypotension of hemodialysis: Secondary | ICD-10-CM | POA: Diagnosis not present

## 2021-07-21 DIAGNOSIS — D631 Anemia in chronic kidney disease: Secondary | ICD-10-CM | POA: Diagnosis not present

## 2021-07-21 DIAGNOSIS — Z992 Dependence on renal dialysis: Secondary | ICD-10-CM | POA: Diagnosis not present

## 2021-07-22 DIAGNOSIS — M329 Systemic lupus erythematosus, unspecified: Secondary | ICD-10-CM | POA: Diagnosis not present

## 2021-07-22 DIAGNOSIS — Z992 Dependence on renal dialysis: Secondary | ICD-10-CM | POA: Diagnosis not present

## 2021-07-22 DIAGNOSIS — Z23 Encounter for immunization: Secondary | ICD-10-CM | POA: Diagnosis not present

## 2021-07-22 DIAGNOSIS — N186 End stage renal disease: Secondary | ICD-10-CM | POA: Diagnosis not present

## 2021-07-22 DIAGNOSIS — N2581 Secondary hyperparathyroidism of renal origin: Secondary | ICD-10-CM | POA: Diagnosis not present

## 2021-07-22 DIAGNOSIS — D631 Anemia in chronic kidney disease: Secondary | ICD-10-CM | POA: Diagnosis not present

## 2021-07-22 DIAGNOSIS — E875 Hyperkalemia: Secondary | ICD-10-CM | POA: Diagnosis not present

## 2021-07-22 DIAGNOSIS — D509 Iron deficiency anemia, unspecified: Secondary | ICD-10-CM | POA: Diagnosis not present

## 2021-07-22 DIAGNOSIS — I953 Hypotension of hemodialysis: Secondary | ICD-10-CM | POA: Diagnosis not present

## 2021-07-25 DIAGNOSIS — I953 Hypotension of hemodialysis: Secondary | ICD-10-CM | POA: Diagnosis not present

## 2021-07-25 DIAGNOSIS — Z992 Dependence on renal dialysis: Secondary | ICD-10-CM | POA: Diagnosis not present

## 2021-07-25 DIAGNOSIS — N186 End stage renal disease: Secondary | ICD-10-CM | POA: Diagnosis not present

## 2021-07-25 DIAGNOSIS — N2581 Secondary hyperparathyroidism of renal origin: Secondary | ICD-10-CM | POA: Diagnosis not present

## 2021-07-25 DIAGNOSIS — E875 Hyperkalemia: Secondary | ICD-10-CM | POA: Diagnosis not present

## 2021-07-26 DIAGNOSIS — N186 End stage renal disease: Secondary | ICD-10-CM | POA: Diagnosis not present

## 2021-07-26 DIAGNOSIS — Z992 Dependence on renal dialysis: Secondary | ICD-10-CM | POA: Diagnosis not present

## 2021-07-26 DIAGNOSIS — N2581 Secondary hyperparathyroidism of renal origin: Secondary | ICD-10-CM | POA: Diagnosis not present

## 2021-07-26 DIAGNOSIS — E875 Hyperkalemia: Secondary | ICD-10-CM | POA: Diagnosis not present

## 2021-07-26 DIAGNOSIS — I953 Hypotension of hemodialysis: Secondary | ICD-10-CM | POA: Diagnosis not present

## 2021-07-28 DIAGNOSIS — N186 End stage renal disease: Secondary | ICD-10-CM | POA: Diagnosis not present

## 2021-07-28 DIAGNOSIS — I953 Hypotension of hemodialysis: Secondary | ICD-10-CM | POA: Diagnosis not present

## 2021-07-28 DIAGNOSIS — N2581 Secondary hyperparathyroidism of renal origin: Secondary | ICD-10-CM | POA: Diagnosis not present

## 2021-07-28 DIAGNOSIS — Z992 Dependence on renal dialysis: Secondary | ICD-10-CM | POA: Diagnosis not present

## 2021-07-28 DIAGNOSIS — E875 Hyperkalemia: Secondary | ICD-10-CM | POA: Diagnosis not present

## 2021-07-29 DIAGNOSIS — N186 End stage renal disease: Secondary | ICD-10-CM | POA: Diagnosis not present

## 2021-07-29 DIAGNOSIS — Z992 Dependence on renal dialysis: Secondary | ICD-10-CM | POA: Diagnosis not present

## 2021-07-29 DIAGNOSIS — E875 Hyperkalemia: Secondary | ICD-10-CM | POA: Diagnosis not present

## 2021-07-29 DIAGNOSIS — N2581 Secondary hyperparathyroidism of renal origin: Secondary | ICD-10-CM | POA: Diagnosis not present

## 2021-07-29 DIAGNOSIS — I953 Hypotension of hemodialysis: Secondary | ICD-10-CM | POA: Diagnosis not present

## 2021-08-01 DIAGNOSIS — E875 Hyperkalemia: Secondary | ICD-10-CM | POA: Diagnosis not present

## 2021-08-01 DIAGNOSIS — N186 End stage renal disease: Secondary | ICD-10-CM | POA: Diagnosis not present

## 2021-08-01 DIAGNOSIS — Z992 Dependence on renal dialysis: Secondary | ICD-10-CM | POA: Diagnosis not present

## 2021-08-01 DIAGNOSIS — N2581 Secondary hyperparathyroidism of renal origin: Secondary | ICD-10-CM | POA: Diagnosis not present

## 2021-08-01 DIAGNOSIS — I953 Hypotension of hemodialysis: Secondary | ICD-10-CM | POA: Diagnosis not present

## 2021-08-02 DIAGNOSIS — I953 Hypotension of hemodialysis: Secondary | ICD-10-CM | POA: Diagnosis not present

## 2021-08-02 DIAGNOSIS — N186 End stage renal disease: Secondary | ICD-10-CM | POA: Diagnosis not present

## 2021-08-02 DIAGNOSIS — E875 Hyperkalemia: Secondary | ICD-10-CM | POA: Diagnosis not present

## 2021-08-02 DIAGNOSIS — N2581 Secondary hyperparathyroidism of renal origin: Secondary | ICD-10-CM | POA: Diagnosis not present

## 2021-08-02 DIAGNOSIS — Z992 Dependence on renal dialysis: Secondary | ICD-10-CM | POA: Diagnosis not present

## 2021-08-03 DIAGNOSIS — T82856A Stenosis of peripheral vascular stent, initial encounter: Secondary | ICD-10-CM | POA: Diagnosis not present

## 2021-08-03 DIAGNOSIS — D631 Anemia in chronic kidney disease: Secondary | ICD-10-CM | POA: Diagnosis not present

## 2021-08-03 DIAGNOSIS — N186 End stage renal disease: Secondary | ICD-10-CM | POA: Diagnosis not present

## 2021-08-03 DIAGNOSIS — Z992 Dependence on renal dialysis: Secondary | ICD-10-CM | POA: Diagnosis not present

## 2021-08-03 DIAGNOSIS — Z8673 Personal history of transient ischemic attack (TIA), and cerebral infarction without residual deficits: Secondary | ICD-10-CM | POA: Diagnosis not present

## 2021-08-03 DIAGNOSIS — T82898A Other specified complication of vascular prosthetic devices, implants and grafts, initial encounter: Secondary | ICD-10-CM | POA: Diagnosis not present

## 2021-08-03 DIAGNOSIS — I120 Hypertensive chronic kidney disease with stage 5 chronic kidney disease or end stage renal disease: Secondary | ICD-10-CM | POA: Diagnosis not present

## 2021-08-03 DIAGNOSIS — I871 Compression of vein: Secondary | ICD-10-CM | POA: Diagnosis not present

## 2021-08-03 DIAGNOSIS — Z95828 Presence of other vascular implants and grafts: Secondary | ICD-10-CM | POA: Diagnosis not present

## 2021-08-04 DIAGNOSIS — Z992 Dependence on renal dialysis: Secondary | ICD-10-CM | POA: Diagnosis not present

## 2021-08-04 DIAGNOSIS — I953 Hypotension of hemodialysis: Secondary | ICD-10-CM | POA: Diagnosis not present

## 2021-08-04 DIAGNOSIS — N2581 Secondary hyperparathyroidism of renal origin: Secondary | ICD-10-CM | POA: Diagnosis not present

## 2021-08-04 DIAGNOSIS — N186 End stage renal disease: Secondary | ICD-10-CM | POA: Diagnosis not present

## 2021-08-04 DIAGNOSIS — E875 Hyperkalemia: Secondary | ICD-10-CM | POA: Diagnosis not present

## 2021-08-05 DIAGNOSIS — N2581 Secondary hyperparathyroidism of renal origin: Secondary | ICD-10-CM | POA: Diagnosis not present

## 2021-08-05 DIAGNOSIS — I953 Hypotension of hemodialysis: Secondary | ICD-10-CM | POA: Diagnosis not present

## 2021-08-05 DIAGNOSIS — N186 End stage renal disease: Secondary | ICD-10-CM | POA: Diagnosis not present

## 2021-08-05 DIAGNOSIS — E875 Hyperkalemia: Secondary | ICD-10-CM | POA: Diagnosis not present

## 2021-08-05 DIAGNOSIS — Z992 Dependence on renal dialysis: Secondary | ICD-10-CM | POA: Diagnosis not present

## 2021-08-08 DIAGNOSIS — Z992 Dependence on renal dialysis: Secondary | ICD-10-CM | POA: Diagnosis not present

## 2021-08-08 DIAGNOSIS — N2581 Secondary hyperparathyroidism of renal origin: Secondary | ICD-10-CM | POA: Diagnosis not present

## 2021-08-08 DIAGNOSIS — I953 Hypotension of hemodialysis: Secondary | ICD-10-CM | POA: Diagnosis not present

## 2021-08-08 DIAGNOSIS — E875 Hyperkalemia: Secondary | ICD-10-CM | POA: Diagnosis not present

## 2021-08-08 DIAGNOSIS — N186 End stage renal disease: Secondary | ICD-10-CM | POA: Diagnosis not present

## 2021-08-09 DIAGNOSIS — N186 End stage renal disease: Secondary | ICD-10-CM | POA: Diagnosis not present

## 2021-08-09 DIAGNOSIS — Z992 Dependence on renal dialysis: Secondary | ICD-10-CM | POA: Diagnosis not present

## 2021-08-09 DIAGNOSIS — E875 Hyperkalemia: Secondary | ICD-10-CM | POA: Diagnosis not present

## 2021-08-09 DIAGNOSIS — N2581 Secondary hyperparathyroidism of renal origin: Secondary | ICD-10-CM | POA: Diagnosis not present

## 2021-08-09 DIAGNOSIS — I953 Hypotension of hemodialysis: Secondary | ICD-10-CM | POA: Diagnosis not present

## 2021-08-11 DIAGNOSIS — N186 End stage renal disease: Secondary | ICD-10-CM | POA: Diagnosis not present

## 2021-08-11 DIAGNOSIS — I953 Hypotension of hemodialysis: Secondary | ICD-10-CM | POA: Diagnosis not present

## 2021-08-11 DIAGNOSIS — E875 Hyperkalemia: Secondary | ICD-10-CM | POA: Diagnosis not present

## 2021-08-11 DIAGNOSIS — Z992 Dependence on renal dialysis: Secondary | ICD-10-CM | POA: Diagnosis not present

## 2021-08-11 DIAGNOSIS — N2581 Secondary hyperparathyroidism of renal origin: Secondary | ICD-10-CM | POA: Diagnosis not present

## 2021-08-12 DIAGNOSIS — N186 End stage renal disease: Secondary | ICD-10-CM | POA: Diagnosis not present

## 2021-08-12 DIAGNOSIS — E875 Hyperkalemia: Secondary | ICD-10-CM | POA: Diagnosis not present

## 2021-08-12 DIAGNOSIS — Z992 Dependence on renal dialysis: Secondary | ICD-10-CM | POA: Diagnosis not present

## 2021-08-12 DIAGNOSIS — N2581 Secondary hyperparathyroidism of renal origin: Secondary | ICD-10-CM | POA: Diagnosis not present

## 2021-08-12 DIAGNOSIS — I953 Hypotension of hemodialysis: Secondary | ICD-10-CM | POA: Diagnosis not present

## 2021-08-15 DIAGNOSIS — N2581 Secondary hyperparathyroidism of renal origin: Secondary | ICD-10-CM | POA: Diagnosis not present

## 2021-08-15 DIAGNOSIS — N186 End stage renal disease: Secondary | ICD-10-CM | POA: Diagnosis not present

## 2021-08-15 DIAGNOSIS — Z992 Dependence on renal dialysis: Secondary | ICD-10-CM | POA: Diagnosis not present

## 2021-08-15 DIAGNOSIS — I953 Hypotension of hemodialysis: Secondary | ICD-10-CM | POA: Diagnosis not present

## 2021-08-15 DIAGNOSIS — E875 Hyperkalemia: Secondary | ICD-10-CM | POA: Diagnosis not present

## 2021-08-16 DIAGNOSIS — N2581 Secondary hyperparathyroidism of renal origin: Secondary | ICD-10-CM | POA: Diagnosis not present

## 2021-08-16 DIAGNOSIS — Z992 Dependence on renal dialysis: Secondary | ICD-10-CM | POA: Diagnosis not present

## 2021-08-16 DIAGNOSIS — E875 Hyperkalemia: Secondary | ICD-10-CM | POA: Diagnosis not present

## 2021-08-16 DIAGNOSIS — N186 End stage renal disease: Secondary | ICD-10-CM | POA: Diagnosis not present

## 2021-08-16 DIAGNOSIS — I953 Hypotension of hemodialysis: Secondary | ICD-10-CM | POA: Diagnosis not present

## 2021-08-18 DIAGNOSIS — I272 Pulmonary hypertension, unspecified: Secondary | ICD-10-CM | POA: Diagnosis not present

## 2021-08-18 DIAGNOSIS — I509 Heart failure, unspecified: Secondary | ICD-10-CM | POA: Diagnosis not present

## 2021-08-18 DIAGNOSIS — N2581 Secondary hyperparathyroidism of renal origin: Secondary | ICD-10-CM | POA: Diagnosis not present

## 2021-08-18 DIAGNOSIS — N186 End stage renal disease: Secondary | ICD-10-CM | POA: Diagnosis not present

## 2021-08-18 DIAGNOSIS — Z992 Dependence on renal dialysis: Secondary | ICD-10-CM | POA: Diagnosis not present

## 2021-08-18 DIAGNOSIS — M3219 Other organ or system involvement in systemic lupus erythematosus: Secondary | ICD-10-CM | POA: Diagnosis not present

## 2021-08-18 DIAGNOSIS — E875 Hyperkalemia: Secondary | ICD-10-CM | POA: Diagnosis not present

## 2021-08-18 DIAGNOSIS — I953 Hypotension of hemodialysis: Secondary | ICD-10-CM | POA: Diagnosis not present

## 2021-08-19 DIAGNOSIS — I953 Hypotension of hemodialysis: Secondary | ICD-10-CM | POA: Diagnosis not present

## 2021-08-19 DIAGNOSIS — N186 End stage renal disease: Secondary | ICD-10-CM | POA: Diagnosis not present

## 2021-08-19 DIAGNOSIS — Z992 Dependence on renal dialysis: Secondary | ICD-10-CM | POA: Diagnosis not present

## 2021-08-19 DIAGNOSIS — E875 Hyperkalemia: Secondary | ICD-10-CM | POA: Diagnosis not present

## 2021-08-19 DIAGNOSIS — N2581 Secondary hyperparathyroidism of renal origin: Secondary | ICD-10-CM | POA: Diagnosis not present

## 2021-08-22 DIAGNOSIS — I953 Hypotension of hemodialysis: Secondary | ICD-10-CM | POA: Diagnosis not present

## 2021-08-22 DIAGNOSIS — Z992 Dependence on renal dialysis: Secondary | ICD-10-CM | POA: Diagnosis not present

## 2021-08-22 DIAGNOSIS — M329 Systemic lupus erythematosus, unspecified: Secondary | ICD-10-CM | POA: Diagnosis not present

## 2021-08-22 DIAGNOSIS — E875 Hyperkalemia: Secondary | ICD-10-CM | POA: Diagnosis not present

## 2021-08-22 DIAGNOSIS — N186 End stage renal disease: Secondary | ICD-10-CM | POA: Diagnosis not present

## 2021-08-22 DIAGNOSIS — N2581 Secondary hyperparathyroidism of renal origin: Secondary | ICD-10-CM | POA: Diagnosis not present

## 2021-08-23 DIAGNOSIS — I953 Hypotension of hemodialysis: Secondary | ICD-10-CM | POA: Diagnosis not present

## 2021-08-23 DIAGNOSIS — E875 Hyperkalemia: Secondary | ICD-10-CM | POA: Diagnosis not present

## 2021-08-23 DIAGNOSIS — N2581 Secondary hyperparathyroidism of renal origin: Secondary | ICD-10-CM | POA: Diagnosis not present

## 2021-08-23 DIAGNOSIS — Z23 Encounter for immunization: Secondary | ICD-10-CM | POA: Diagnosis not present

## 2021-08-23 DIAGNOSIS — N186 End stage renal disease: Secondary | ICD-10-CM | POA: Diagnosis not present

## 2021-08-23 DIAGNOSIS — Z992 Dependence on renal dialysis: Secondary | ICD-10-CM | POA: Diagnosis not present

## 2021-08-24 DIAGNOSIS — N2581 Secondary hyperparathyroidism of renal origin: Secondary | ICD-10-CM | POA: Diagnosis not present

## 2021-08-24 DIAGNOSIS — E875 Hyperkalemia: Secondary | ICD-10-CM | POA: Diagnosis not present

## 2021-08-24 DIAGNOSIS — Z992 Dependence on renal dialysis: Secondary | ICD-10-CM | POA: Diagnosis not present

## 2021-08-24 DIAGNOSIS — N186 End stage renal disease: Secondary | ICD-10-CM | POA: Diagnosis not present

## 2021-08-24 DIAGNOSIS — I953 Hypotension of hemodialysis: Secondary | ICD-10-CM | POA: Diagnosis not present

## 2021-08-24 DIAGNOSIS — Z23 Encounter for immunization: Secondary | ICD-10-CM | POA: Diagnosis not present

## 2021-08-25 DIAGNOSIS — N186 End stage renal disease: Secondary | ICD-10-CM | POA: Diagnosis not present

## 2021-08-25 DIAGNOSIS — Z23 Encounter for immunization: Secondary | ICD-10-CM | POA: Diagnosis not present

## 2021-08-25 DIAGNOSIS — E875 Hyperkalemia: Secondary | ICD-10-CM | POA: Diagnosis not present

## 2021-08-25 DIAGNOSIS — I953 Hypotension of hemodialysis: Secondary | ICD-10-CM | POA: Diagnosis not present

## 2021-08-25 DIAGNOSIS — Z992 Dependence on renal dialysis: Secondary | ICD-10-CM | POA: Diagnosis not present

## 2021-08-25 DIAGNOSIS — N2581 Secondary hyperparathyroidism of renal origin: Secondary | ICD-10-CM | POA: Diagnosis not present

## 2021-08-26 DIAGNOSIS — N186 End stage renal disease: Secondary | ICD-10-CM | POA: Diagnosis not present

## 2021-08-26 DIAGNOSIS — N2581 Secondary hyperparathyroidism of renal origin: Secondary | ICD-10-CM | POA: Diagnosis not present

## 2021-08-26 DIAGNOSIS — Z992 Dependence on renal dialysis: Secondary | ICD-10-CM | POA: Diagnosis not present

## 2021-08-26 DIAGNOSIS — E875 Hyperkalemia: Secondary | ICD-10-CM | POA: Diagnosis not present

## 2021-08-26 DIAGNOSIS — I953 Hypotension of hemodialysis: Secondary | ICD-10-CM | POA: Diagnosis not present

## 2021-08-26 DIAGNOSIS — Z23 Encounter for immunization: Secondary | ICD-10-CM | POA: Diagnosis not present

## 2021-08-29 DIAGNOSIS — E875 Hyperkalemia: Secondary | ICD-10-CM | POA: Diagnosis not present

## 2021-08-29 DIAGNOSIS — N2581 Secondary hyperparathyroidism of renal origin: Secondary | ICD-10-CM | POA: Diagnosis not present

## 2021-08-29 DIAGNOSIS — I953 Hypotension of hemodialysis: Secondary | ICD-10-CM | POA: Diagnosis not present

## 2021-08-29 DIAGNOSIS — Z992 Dependence on renal dialysis: Secondary | ICD-10-CM | POA: Diagnosis not present

## 2021-08-29 DIAGNOSIS — N186 End stage renal disease: Secondary | ICD-10-CM | POA: Diagnosis not present

## 2021-08-29 DIAGNOSIS — Z23 Encounter for immunization: Secondary | ICD-10-CM | POA: Diagnosis not present

## 2021-08-30 DIAGNOSIS — Z23 Encounter for immunization: Secondary | ICD-10-CM | POA: Diagnosis not present

## 2021-08-30 DIAGNOSIS — E875 Hyperkalemia: Secondary | ICD-10-CM | POA: Diagnosis not present

## 2021-08-30 DIAGNOSIS — Z992 Dependence on renal dialysis: Secondary | ICD-10-CM | POA: Diagnosis not present

## 2021-08-30 DIAGNOSIS — I953 Hypotension of hemodialysis: Secondary | ICD-10-CM | POA: Diagnosis not present

## 2021-08-30 DIAGNOSIS — N2581 Secondary hyperparathyroidism of renal origin: Secondary | ICD-10-CM | POA: Diagnosis not present

## 2021-08-30 DIAGNOSIS — N186 End stage renal disease: Secondary | ICD-10-CM | POA: Diagnosis not present

## 2021-08-31 DIAGNOSIS — N2581 Secondary hyperparathyroidism of renal origin: Secondary | ICD-10-CM | POA: Diagnosis not present

## 2021-08-31 DIAGNOSIS — Z992 Dependence on renal dialysis: Secondary | ICD-10-CM | POA: Diagnosis not present

## 2021-08-31 DIAGNOSIS — Z23 Encounter for immunization: Secondary | ICD-10-CM | POA: Diagnosis not present

## 2021-08-31 DIAGNOSIS — I953 Hypotension of hemodialysis: Secondary | ICD-10-CM | POA: Diagnosis not present

## 2021-08-31 DIAGNOSIS — E875 Hyperkalemia: Secondary | ICD-10-CM | POA: Diagnosis not present

## 2021-08-31 DIAGNOSIS — N186 End stage renal disease: Secondary | ICD-10-CM | POA: Diagnosis not present

## 2021-09-01 DIAGNOSIS — I953 Hypotension of hemodialysis: Secondary | ICD-10-CM | POA: Diagnosis not present

## 2021-09-01 DIAGNOSIS — Z23 Encounter for immunization: Secondary | ICD-10-CM | POA: Diagnosis not present

## 2021-09-01 DIAGNOSIS — N186 End stage renal disease: Secondary | ICD-10-CM | POA: Diagnosis not present

## 2021-09-01 DIAGNOSIS — E875 Hyperkalemia: Secondary | ICD-10-CM | POA: Diagnosis not present

## 2021-09-01 DIAGNOSIS — N2581 Secondary hyperparathyroidism of renal origin: Secondary | ICD-10-CM | POA: Diagnosis not present

## 2021-09-01 DIAGNOSIS — Z992 Dependence on renal dialysis: Secondary | ICD-10-CM | POA: Diagnosis not present

## 2021-09-02 DIAGNOSIS — E875 Hyperkalemia: Secondary | ICD-10-CM | POA: Diagnosis not present

## 2021-09-02 DIAGNOSIS — Z23 Encounter for immunization: Secondary | ICD-10-CM | POA: Diagnosis not present

## 2021-09-02 DIAGNOSIS — I953 Hypotension of hemodialysis: Secondary | ICD-10-CM | POA: Diagnosis not present

## 2021-09-02 DIAGNOSIS — N186 End stage renal disease: Secondary | ICD-10-CM | POA: Diagnosis not present

## 2021-09-02 DIAGNOSIS — N2581 Secondary hyperparathyroidism of renal origin: Secondary | ICD-10-CM | POA: Diagnosis not present

## 2021-09-02 DIAGNOSIS — Z992 Dependence on renal dialysis: Secondary | ICD-10-CM | POA: Diagnosis not present

## 2021-09-05 DIAGNOSIS — I953 Hypotension of hemodialysis: Secondary | ICD-10-CM | POA: Diagnosis not present

## 2021-09-05 DIAGNOSIS — N2581 Secondary hyperparathyroidism of renal origin: Secondary | ICD-10-CM | POA: Diagnosis not present

## 2021-09-05 DIAGNOSIS — Z992 Dependence on renal dialysis: Secondary | ICD-10-CM | POA: Diagnosis not present

## 2021-09-05 DIAGNOSIS — E875 Hyperkalemia: Secondary | ICD-10-CM | POA: Diagnosis not present

## 2021-09-05 DIAGNOSIS — N186 End stage renal disease: Secondary | ICD-10-CM | POA: Diagnosis not present

## 2021-09-05 DIAGNOSIS — Z23 Encounter for immunization: Secondary | ICD-10-CM | POA: Diagnosis not present

## 2021-09-06 DIAGNOSIS — N2581 Secondary hyperparathyroidism of renal origin: Secondary | ICD-10-CM | POA: Diagnosis not present

## 2021-09-06 DIAGNOSIS — I953 Hypotension of hemodialysis: Secondary | ICD-10-CM | POA: Diagnosis not present

## 2021-09-06 DIAGNOSIS — Z992 Dependence on renal dialysis: Secondary | ICD-10-CM | POA: Diagnosis not present

## 2021-09-06 DIAGNOSIS — N186 End stage renal disease: Secondary | ICD-10-CM | POA: Diagnosis not present

## 2021-09-06 DIAGNOSIS — E875 Hyperkalemia: Secondary | ICD-10-CM | POA: Diagnosis not present

## 2021-09-06 DIAGNOSIS — Z23 Encounter for immunization: Secondary | ICD-10-CM | POA: Diagnosis not present

## 2021-09-08 DIAGNOSIS — N2581 Secondary hyperparathyroidism of renal origin: Secondary | ICD-10-CM | POA: Diagnosis not present

## 2021-09-08 DIAGNOSIS — Z992 Dependence on renal dialysis: Secondary | ICD-10-CM | POA: Diagnosis not present

## 2021-09-08 DIAGNOSIS — Z23 Encounter for immunization: Secondary | ICD-10-CM | POA: Diagnosis not present

## 2021-09-08 DIAGNOSIS — I953 Hypotension of hemodialysis: Secondary | ICD-10-CM | POA: Diagnosis not present

## 2021-09-08 DIAGNOSIS — N186 End stage renal disease: Secondary | ICD-10-CM | POA: Diagnosis not present

## 2021-09-08 DIAGNOSIS — E875 Hyperkalemia: Secondary | ICD-10-CM | POA: Diagnosis not present

## 2021-09-09 DIAGNOSIS — N2581 Secondary hyperparathyroidism of renal origin: Secondary | ICD-10-CM | POA: Diagnosis not present

## 2021-09-09 DIAGNOSIS — Z23 Encounter for immunization: Secondary | ICD-10-CM | POA: Diagnosis not present

## 2021-09-09 DIAGNOSIS — E875 Hyperkalemia: Secondary | ICD-10-CM | POA: Diagnosis not present

## 2021-09-09 DIAGNOSIS — N186 End stage renal disease: Secondary | ICD-10-CM | POA: Diagnosis not present

## 2021-09-09 DIAGNOSIS — I953 Hypotension of hemodialysis: Secondary | ICD-10-CM | POA: Diagnosis not present

## 2021-09-09 DIAGNOSIS — Z992 Dependence on renal dialysis: Secondary | ICD-10-CM | POA: Diagnosis not present

## 2021-09-12 DIAGNOSIS — Z992 Dependence on renal dialysis: Secondary | ICD-10-CM | POA: Diagnosis not present

## 2021-09-12 DIAGNOSIS — Z23 Encounter for immunization: Secondary | ICD-10-CM | POA: Diagnosis not present

## 2021-09-12 DIAGNOSIS — I953 Hypotension of hemodialysis: Secondary | ICD-10-CM | POA: Diagnosis not present

## 2021-09-12 DIAGNOSIS — E875 Hyperkalemia: Secondary | ICD-10-CM | POA: Diagnosis not present

## 2021-09-12 DIAGNOSIS — N2581 Secondary hyperparathyroidism of renal origin: Secondary | ICD-10-CM | POA: Diagnosis not present

## 2021-09-12 DIAGNOSIS — N186 End stage renal disease: Secondary | ICD-10-CM | POA: Diagnosis not present

## 2021-09-13 DIAGNOSIS — E875 Hyperkalemia: Secondary | ICD-10-CM | POA: Diagnosis not present

## 2021-09-13 DIAGNOSIS — Z992 Dependence on renal dialysis: Secondary | ICD-10-CM | POA: Diagnosis not present

## 2021-09-13 DIAGNOSIS — Z23 Encounter for immunization: Secondary | ICD-10-CM | POA: Diagnosis not present

## 2021-09-13 DIAGNOSIS — N186 End stage renal disease: Secondary | ICD-10-CM | POA: Diagnosis not present

## 2021-09-13 DIAGNOSIS — I953 Hypotension of hemodialysis: Secondary | ICD-10-CM | POA: Diagnosis not present

## 2021-09-13 DIAGNOSIS — N2581 Secondary hyperparathyroidism of renal origin: Secondary | ICD-10-CM | POA: Diagnosis not present

## 2021-09-15 DIAGNOSIS — Z23 Encounter for immunization: Secondary | ICD-10-CM | POA: Diagnosis not present

## 2021-09-15 DIAGNOSIS — Z992 Dependence on renal dialysis: Secondary | ICD-10-CM | POA: Diagnosis not present

## 2021-09-15 DIAGNOSIS — E875 Hyperkalemia: Secondary | ICD-10-CM | POA: Diagnosis not present

## 2021-09-15 DIAGNOSIS — N2581 Secondary hyperparathyroidism of renal origin: Secondary | ICD-10-CM | POA: Diagnosis not present

## 2021-09-15 DIAGNOSIS — N186 End stage renal disease: Secondary | ICD-10-CM | POA: Diagnosis not present

## 2021-09-15 DIAGNOSIS — I953 Hypotension of hemodialysis: Secondary | ICD-10-CM | POA: Diagnosis not present

## 2021-09-16 DIAGNOSIS — Z23 Encounter for immunization: Secondary | ICD-10-CM | POA: Diagnosis not present

## 2021-09-16 DIAGNOSIS — I953 Hypotension of hemodialysis: Secondary | ICD-10-CM | POA: Diagnosis not present

## 2021-09-16 DIAGNOSIS — E875 Hyperkalemia: Secondary | ICD-10-CM | POA: Diagnosis not present

## 2021-09-16 DIAGNOSIS — Z992 Dependence on renal dialysis: Secondary | ICD-10-CM | POA: Diagnosis not present

## 2021-09-16 DIAGNOSIS — N186 End stage renal disease: Secondary | ICD-10-CM | POA: Diagnosis not present

## 2021-09-16 DIAGNOSIS — N2581 Secondary hyperparathyroidism of renal origin: Secondary | ICD-10-CM | POA: Diagnosis not present

## 2021-09-18 DIAGNOSIS — M3219 Other organ or system involvement in systemic lupus erythematosus: Secondary | ICD-10-CM | POA: Diagnosis not present

## 2021-09-18 DIAGNOSIS — I509 Heart failure, unspecified: Secondary | ICD-10-CM | POA: Diagnosis not present

## 2021-09-18 DIAGNOSIS — I272 Pulmonary hypertension, unspecified: Secondary | ICD-10-CM | POA: Diagnosis not present

## 2021-09-19 DIAGNOSIS — Z23 Encounter for immunization: Secondary | ICD-10-CM | POA: Diagnosis not present

## 2021-09-19 DIAGNOSIS — N186 End stage renal disease: Secondary | ICD-10-CM | POA: Diagnosis not present

## 2021-09-19 DIAGNOSIS — Z992 Dependence on renal dialysis: Secondary | ICD-10-CM | POA: Diagnosis not present

## 2021-09-19 DIAGNOSIS — E875 Hyperkalemia: Secondary | ICD-10-CM | POA: Diagnosis not present

## 2021-09-19 DIAGNOSIS — N2581 Secondary hyperparathyroidism of renal origin: Secondary | ICD-10-CM | POA: Diagnosis not present

## 2021-09-19 DIAGNOSIS — I953 Hypotension of hemodialysis: Secondary | ICD-10-CM | POA: Diagnosis not present

## 2021-09-20 DIAGNOSIS — Z23 Encounter for immunization: Secondary | ICD-10-CM | POA: Diagnosis not present

## 2021-09-20 DIAGNOSIS — Z992 Dependence on renal dialysis: Secondary | ICD-10-CM | POA: Diagnosis not present

## 2021-09-20 DIAGNOSIS — N186 End stage renal disease: Secondary | ICD-10-CM | POA: Diagnosis not present

## 2021-09-20 DIAGNOSIS — R0602 Shortness of breath: Secondary | ICD-10-CM | POA: Diagnosis not present

## 2021-09-20 DIAGNOSIS — I272 Pulmonary hypertension, unspecified: Secondary | ICD-10-CM | POA: Diagnosis not present

## 2021-09-20 DIAGNOSIS — I071 Rheumatic tricuspid insufficiency: Secondary | ICD-10-CM | POA: Diagnosis not present

## 2021-09-20 DIAGNOSIS — N2581 Secondary hyperparathyroidism of renal origin: Secondary | ICD-10-CM | POA: Diagnosis not present

## 2021-09-20 DIAGNOSIS — E875 Hyperkalemia: Secondary | ICD-10-CM | POA: Diagnosis not present

## 2021-09-20 DIAGNOSIS — Z79899 Other long term (current) drug therapy: Secondary | ICD-10-CM | POA: Diagnosis not present

## 2021-09-20 DIAGNOSIS — I953 Hypotension of hemodialysis: Secondary | ICD-10-CM | POA: Diagnosis not present

## 2021-09-21 DIAGNOSIS — Z79899 Other long term (current) drug therapy: Secondary | ICD-10-CM | POA: Diagnosis not present

## 2021-09-21 DIAGNOSIS — Z992 Dependence on renal dialysis: Secondary | ICD-10-CM | POA: Diagnosis not present

## 2021-09-21 DIAGNOSIS — I272 Pulmonary hypertension, unspecified: Secondary | ICD-10-CM | POA: Diagnosis not present

## 2021-09-21 DIAGNOSIS — M329 Systemic lupus erythematosus, unspecified: Secondary | ICD-10-CM | POA: Diagnosis not present

## 2021-09-21 DIAGNOSIS — R0602 Shortness of breath: Secondary | ICD-10-CM | POA: Diagnosis not present

## 2021-09-21 DIAGNOSIS — N186 End stage renal disease: Secondary | ICD-10-CM | POA: Diagnosis not present

## 2021-09-21 DIAGNOSIS — I5021 Acute systolic (congestive) heart failure: Secondary | ICD-10-CM | POA: Diagnosis not present

## 2021-09-22 DIAGNOSIS — N186 End stage renal disease: Secondary | ICD-10-CM | POA: Diagnosis not present

## 2021-09-22 DIAGNOSIS — I953 Hypotension of hemodialysis: Secondary | ICD-10-CM | POA: Diagnosis not present

## 2021-09-22 DIAGNOSIS — Z23 Encounter for immunization: Secondary | ICD-10-CM | POA: Diagnosis not present

## 2021-09-22 DIAGNOSIS — N2581 Secondary hyperparathyroidism of renal origin: Secondary | ICD-10-CM | POA: Diagnosis not present

## 2021-09-22 DIAGNOSIS — E875 Hyperkalemia: Secondary | ICD-10-CM | POA: Diagnosis not present

## 2021-09-22 DIAGNOSIS — D631 Anemia in chronic kidney disease: Secondary | ICD-10-CM | POA: Diagnosis not present

## 2021-09-22 DIAGNOSIS — Z992 Dependence on renal dialysis: Secondary | ICD-10-CM | POA: Diagnosis not present

## 2021-09-23 DIAGNOSIS — E875 Hyperkalemia: Secondary | ICD-10-CM | POA: Diagnosis not present

## 2021-09-23 DIAGNOSIS — Z992 Dependence on renal dialysis: Secondary | ICD-10-CM | POA: Diagnosis not present

## 2021-09-23 DIAGNOSIS — D631 Anemia in chronic kidney disease: Secondary | ICD-10-CM | POA: Diagnosis not present

## 2021-09-23 DIAGNOSIS — N2581 Secondary hyperparathyroidism of renal origin: Secondary | ICD-10-CM | POA: Diagnosis not present

## 2021-09-23 DIAGNOSIS — N186 End stage renal disease: Secondary | ICD-10-CM | POA: Diagnosis not present

## 2021-09-23 DIAGNOSIS — I953 Hypotension of hemodialysis: Secondary | ICD-10-CM | POA: Diagnosis not present

## 2021-09-23 DIAGNOSIS — Z23 Encounter for immunization: Secondary | ICD-10-CM | POA: Diagnosis not present

## 2021-09-25 DIAGNOSIS — Z992 Dependence on renal dialysis: Secondary | ICD-10-CM | POA: Diagnosis not present

## 2021-09-25 DIAGNOSIS — N2581 Secondary hyperparathyroidism of renal origin: Secondary | ICD-10-CM | POA: Diagnosis not present

## 2021-09-25 DIAGNOSIS — D631 Anemia in chronic kidney disease: Secondary | ICD-10-CM | POA: Diagnosis not present

## 2021-09-25 DIAGNOSIS — E875 Hyperkalemia: Secondary | ICD-10-CM | POA: Diagnosis not present

## 2021-09-25 DIAGNOSIS — N186 End stage renal disease: Secondary | ICD-10-CM | POA: Diagnosis not present

## 2021-09-25 DIAGNOSIS — I953 Hypotension of hemodialysis: Secondary | ICD-10-CM | POA: Diagnosis not present

## 2021-09-25 DIAGNOSIS — Z23 Encounter for immunization: Secondary | ICD-10-CM | POA: Diagnosis not present

## 2021-09-26 DIAGNOSIS — N186 End stage renal disease: Secondary | ICD-10-CM | POA: Diagnosis not present

## 2021-09-26 DIAGNOSIS — E875 Hyperkalemia: Secondary | ICD-10-CM | POA: Diagnosis not present

## 2021-09-26 DIAGNOSIS — D631 Anemia in chronic kidney disease: Secondary | ICD-10-CM | POA: Diagnosis not present

## 2021-09-26 DIAGNOSIS — I953 Hypotension of hemodialysis: Secondary | ICD-10-CM | POA: Diagnosis not present

## 2021-09-26 DIAGNOSIS — Z23 Encounter for immunization: Secondary | ICD-10-CM | POA: Diagnosis not present

## 2021-09-26 DIAGNOSIS — N2581 Secondary hyperparathyroidism of renal origin: Secondary | ICD-10-CM | POA: Diagnosis not present

## 2021-09-26 DIAGNOSIS — Z992 Dependence on renal dialysis: Secondary | ICD-10-CM | POA: Diagnosis not present

## 2021-09-27 DIAGNOSIS — I953 Hypotension of hemodialysis: Secondary | ICD-10-CM | POA: Diagnosis not present

## 2021-09-27 DIAGNOSIS — Z992 Dependence on renal dialysis: Secondary | ICD-10-CM | POA: Diagnosis not present

## 2021-09-27 DIAGNOSIS — E875 Hyperkalemia: Secondary | ICD-10-CM | POA: Diagnosis not present

## 2021-09-27 DIAGNOSIS — D631 Anemia in chronic kidney disease: Secondary | ICD-10-CM | POA: Diagnosis not present

## 2021-09-27 DIAGNOSIS — N2581 Secondary hyperparathyroidism of renal origin: Secondary | ICD-10-CM | POA: Diagnosis not present

## 2021-09-27 DIAGNOSIS — Z23 Encounter for immunization: Secondary | ICD-10-CM | POA: Diagnosis not present

## 2021-09-27 DIAGNOSIS — N186 End stage renal disease: Secondary | ICD-10-CM | POA: Diagnosis not present

## 2021-09-29 DIAGNOSIS — N2581 Secondary hyperparathyroidism of renal origin: Secondary | ICD-10-CM | POA: Diagnosis not present

## 2021-09-29 DIAGNOSIS — N186 End stage renal disease: Secondary | ICD-10-CM | POA: Diagnosis not present

## 2021-09-29 DIAGNOSIS — I953 Hypotension of hemodialysis: Secondary | ICD-10-CM | POA: Diagnosis not present

## 2021-09-29 DIAGNOSIS — E875 Hyperkalemia: Secondary | ICD-10-CM | POA: Diagnosis not present

## 2021-09-29 DIAGNOSIS — Z992 Dependence on renal dialysis: Secondary | ICD-10-CM | POA: Diagnosis not present

## 2021-09-29 DIAGNOSIS — Z23 Encounter for immunization: Secondary | ICD-10-CM | POA: Diagnosis not present

## 2021-09-29 DIAGNOSIS — D631 Anemia in chronic kidney disease: Secondary | ICD-10-CM | POA: Diagnosis not present

## 2021-09-30 DIAGNOSIS — N186 End stage renal disease: Secondary | ICD-10-CM | POA: Diagnosis not present

## 2021-09-30 DIAGNOSIS — N2581 Secondary hyperparathyroidism of renal origin: Secondary | ICD-10-CM | POA: Diagnosis not present

## 2021-09-30 DIAGNOSIS — Z23 Encounter for immunization: Secondary | ICD-10-CM | POA: Diagnosis not present

## 2021-09-30 DIAGNOSIS — Z992 Dependence on renal dialysis: Secondary | ICD-10-CM | POA: Diagnosis not present

## 2021-09-30 DIAGNOSIS — E875 Hyperkalemia: Secondary | ICD-10-CM | POA: Diagnosis not present

## 2021-09-30 DIAGNOSIS — I953 Hypotension of hemodialysis: Secondary | ICD-10-CM | POA: Diagnosis not present

## 2021-09-30 DIAGNOSIS — D631 Anemia in chronic kidney disease: Secondary | ICD-10-CM | POA: Diagnosis not present

## 2021-10-03 DIAGNOSIS — Z992 Dependence on renal dialysis: Secondary | ICD-10-CM | POA: Diagnosis not present

## 2021-10-03 DIAGNOSIS — N2581 Secondary hyperparathyroidism of renal origin: Secondary | ICD-10-CM | POA: Diagnosis not present

## 2021-10-03 DIAGNOSIS — N186 End stage renal disease: Secondary | ICD-10-CM | POA: Diagnosis not present

## 2021-10-03 DIAGNOSIS — D631 Anemia in chronic kidney disease: Secondary | ICD-10-CM | POA: Diagnosis not present

## 2021-10-03 DIAGNOSIS — I953 Hypotension of hemodialysis: Secondary | ICD-10-CM | POA: Diagnosis not present

## 2021-10-03 DIAGNOSIS — E875 Hyperkalemia: Secondary | ICD-10-CM | POA: Diagnosis not present

## 2021-10-03 DIAGNOSIS — Z23 Encounter for immunization: Secondary | ICD-10-CM | POA: Diagnosis not present

## 2021-10-04 DIAGNOSIS — D631 Anemia in chronic kidney disease: Secondary | ICD-10-CM | POA: Diagnosis not present

## 2021-10-04 DIAGNOSIS — E875 Hyperkalemia: Secondary | ICD-10-CM | POA: Diagnosis not present

## 2021-10-04 DIAGNOSIS — N2581 Secondary hyperparathyroidism of renal origin: Secondary | ICD-10-CM | POA: Diagnosis not present

## 2021-10-04 DIAGNOSIS — I953 Hypotension of hemodialysis: Secondary | ICD-10-CM | POA: Diagnosis not present

## 2021-10-04 DIAGNOSIS — Z23 Encounter for immunization: Secondary | ICD-10-CM | POA: Diagnosis not present

## 2021-10-04 DIAGNOSIS — N186 End stage renal disease: Secondary | ICD-10-CM | POA: Diagnosis not present

## 2021-10-04 DIAGNOSIS — Z992 Dependence on renal dialysis: Secondary | ICD-10-CM | POA: Diagnosis not present

## 2021-10-05 DIAGNOSIS — M549 Dorsalgia, unspecified: Secondary | ICD-10-CM | POA: Diagnosis not present

## 2021-10-05 DIAGNOSIS — M25561 Pain in right knee: Secondary | ICD-10-CM | POA: Diagnosis not present

## 2021-10-05 DIAGNOSIS — M3219 Other organ or system involvement in systemic lupus erythematosus: Secondary | ICD-10-CM | POA: Diagnosis not present

## 2021-10-05 DIAGNOSIS — I7300 Raynaud's syndrome without gangrene: Secondary | ICD-10-CM | POA: Diagnosis not present

## 2021-10-05 DIAGNOSIS — M5136 Other intervertebral disc degeneration, lumbar region: Secondary | ICD-10-CM | POA: Diagnosis not present

## 2021-10-05 DIAGNOSIS — M25562 Pain in left knee: Secondary | ICD-10-CM | POA: Diagnosis not present

## 2021-10-05 DIAGNOSIS — Z79899 Other long term (current) drug therapy: Secondary | ICD-10-CM | POA: Diagnosis not present

## 2021-10-06 DIAGNOSIS — D631 Anemia in chronic kidney disease: Secondary | ICD-10-CM | POA: Diagnosis not present

## 2021-10-06 DIAGNOSIS — Z992 Dependence on renal dialysis: Secondary | ICD-10-CM | POA: Diagnosis not present

## 2021-10-06 DIAGNOSIS — Z23 Encounter for immunization: Secondary | ICD-10-CM | POA: Diagnosis not present

## 2021-10-06 DIAGNOSIS — N2581 Secondary hyperparathyroidism of renal origin: Secondary | ICD-10-CM | POA: Diagnosis not present

## 2021-10-06 DIAGNOSIS — N186 End stage renal disease: Secondary | ICD-10-CM | POA: Diagnosis not present

## 2021-10-06 DIAGNOSIS — I953 Hypotension of hemodialysis: Secondary | ICD-10-CM | POA: Diagnosis not present

## 2021-10-06 DIAGNOSIS — E875 Hyperkalemia: Secondary | ICD-10-CM | POA: Diagnosis not present

## 2021-10-07 DIAGNOSIS — Z23 Encounter for immunization: Secondary | ICD-10-CM | POA: Diagnosis not present

## 2021-10-07 DIAGNOSIS — Z992 Dependence on renal dialysis: Secondary | ICD-10-CM | POA: Diagnosis not present

## 2021-10-07 DIAGNOSIS — D631 Anemia in chronic kidney disease: Secondary | ICD-10-CM | POA: Diagnosis not present

## 2021-10-07 DIAGNOSIS — N2581 Secondary hyperparathyroidism of renal origin: Secondary | ICD-10-CM | POA: Diagnosis not present

## 2021-10-07 DIAGNOSIS — E875 Hyperkalemia: Secondary | ICD-10-CM | POA: Diagnosis not present

## 2021-10-07 DIAGNOSIS — I953 Hypotension of hemodialysis: Secondary | ICD-10-CM | POA: Diagnosis not present

## 2021-10-07 DIAGNOSIS — N186 End stage renal disease: Secondary | ICD-10-CM | POA: Diagnosis not present

## 2021-10-10 DIAGNOSIS — N186 End stage renal disease: Secondary | ICD-10-CM | POA: Diagnosis not present

## 2021-10-10 DIAGNOSIS — I953 Hypotension of hemodialysis: Secondary | ICD-10-CM | POA: Diagnosis not present

## 2021-10-10 DIAGNOSIS — N2581 Secondary hyperparathyroidism of renal origin: Secondary | ICD-10-CM | POA: Diagnosis not present

## 2021-10-10 DIAGNOSIS — Z992 Dependence on renal dialysis: Secondary | ICD-10-CM | POA: Diagnosis not present

## 2021-10-10 DIAGNOSIS — D631 Anemia in chronic kidney disease: Secondary | ICD-10-CM | POA: Diagnosis not present

## 2021-10-10 DIAGNOSIS — A09 Infectious gastroenteritis and colitis, unspecified: Secondary | ICD-10-CM | POA: Diagnosis not present

## 2021-10-10 DIAGNOSIS — E875 Hyperkalemia: Secondary | ICD-10-CM | POA: Diagnosis not present

## 2021-10-10 DIAGNOSIS — Z23 Encounter for immunization: Secondary | ICD-10-CM | POA: Diagnosis not present

## 2021-10-11 DIAGNOSIS — I953 Hypotension of hemodialysis: Secondary | ICD-10-CM | POA: Diagnosis not present

## 2021-10-11 DIAGNOSIS — Z23 Encounter for immunization: Secondary | ICD-10-CM | POA: Diagnosis not present

## 2021-10-11 DIAGNOSIS — D631 Anemia in chronic kidney disease: Secondary | ICD-10-CM | POA: Diagnosis not present

## 2021-10-11 DIAGNOSIS — Z992 Dependence on renal dialysis: Secondary | ICD-10-CM | POA: Diagnosis not present

## 2021-10-11 DIAGNOSIS — N186 End stage renal disease: Secondary | ICD-10-CM | POA: Diagnosis not present

## 2021-10-11 DIAGNOSIS — N2581 Secondary hyperparathyroidism of renal origin: Secondary | ICD-10-CM | POA: Diagnosis not present

## 2021-10-11 DIAGNOSIS — E875 Hyperkalemia: Secondary | ICD-10-CM | POA: Diagnosis not present

## 2021-10-13 DIAGNOSIS — I953 Hypotension of hemodialysis: Secondary | ICD-10-CM | POA: Diagnosis not present

## 2021-10-13 DIAGNOSIS — E875 Hyperkalemia: Secondary | ICD-10-CM | POA: Diagnosis not present

## 2021-10-13 DIAGNOSIS — D631 Anemia in chronic kidney disease: Secondary | ICD-10-CM | POA: Diagnosis not present

## 2021-10-13 DIAGNOSIS — Z992 Dependence on renal dialysis: Secondary | ICD-10-CM | POA: Diagnosis not present

## 2021-10-13 DIAGNOSIS — Z23 Encounter for immunization: Secondary | ICD-10-CM | POA: Diagnosis not present

## 2021-10-13 DIAGNOSIS — N2581 Secondary hyperparathyroidism of renal origin: Secondary | ICD-10-CM | POA: Diagnosis not present

## 2021-10-13 DIAGNOSIS — N186 End stage renal disease: Secondary | ICD-10-CM | POA: Diagnosis not present

## 2021-10-14 DIAGNOSIS — D631 Anemia in chronic kidney disease: Secondary | ICD-10-CM | POA: Diagnosis not present

## 2021-10-14 DIAGNOSIS — I953 Hypotension of hemodialysis: Secondary | ICD-10-CM | POA: Diagnosis not present

## 2021-10-14 DIAGNOSIS — N186 End stage renal disease: Secondary | ICD-10-CM | POA: Diagnosis not present

## 2021-10-14 DIAGNOSIS — Z23 Encounter for immunization: Secondary | ICD-10-CM | POA: Diagnosis not present

## 2021-10-14 DIAGNOSIS — E875 Hyperkalemia: Secondary | ICD-10-CM | POA: Diagnosis not present

## 2021-10-14 DIAGNOSIS — N2581 Secondary hyperparathyroidism of renal origin: Secondary | ICD-10-CM | POA: Diagnosis not present

## 2021-10-14 DIAGNOSIS — Z992 Dependence on renal dialysis: Secondary | ICD-10-CM | POA: Diagnosis not present

## 2021-10-17 DIAGNOSIS — Z992 Dependence on renal dialysis: Secondary | ICD-10-CM | POA: Diagnosis not present

## 2021-10-17 DIAGNOSIS — D631 Anemia in chronic kidney disease: Secondary | ICD-10-CM | POA: Diagnosis not present

## 2021-10-17 DIAGNOSIS — I953 Hypotension of hemodialysis: Secondary | ICD-10-CM | POA: Diagnosis not present

## 2021-10-17 DIAGNOSIS — Z23 Encounter for immunization: Secondary | ICD-10-CM | POA: Diagnosis not present

## 2021-10-17 DIAGNOSIS — N2581 Secondary hyperparathyroidism of renal origin: Secondary | ICD-10-CM | POA: Diagnosis not present

## 2021-10-17 DIAGNOSIS — E875 Hyperkalemia: Secondary | ICD-10-CM | POA: Diagnosis not present

## 2021-10-17 DIAGNOSIS — N186 End stage renal disease: Secondary | ICD-10-CM | POA: Diagnosis not present

## 2021-10-18 DIAGNOSIS — D631 Anemia in chronic kidney disease: Secondary | ICD-10-CM | POA: Diagnosis not present

## 2021-10-18 DIAGNOSIS — E875 Hyperkalemia: Secondary | ICD-10-CM | POA: Diagnosis not present

## 2021-10-18 DIAGNOSIS — I272 Pulmonary hypertension, unspecified: Secondary | ICD-10-CM | POA: Diagnosis not present

## 2021-10-18 DIAGNOSIS — Z992 Dependence on renal dialysis: Secondary | ICD-10-CM | POA: Diagnosis not present

## 2021-10-18 DIAGNOSIS — Z23 Encounter for immunization: Secondary | ICD-10-CM | POA: Diagnosis not present

## 2021-10-18 DIAGNOSIS — N2581 Secondary hyperparathyroidism of renal origin: Secondary | ICD-10-CM | POA: Diagnosis not present

## 2021-10-18 DIAGNOSIS — I953 Hypotension of hemodialysis: Secondary | ICD-10-CM | POA: Diagnosis not present

## 2021-10-18 DIAGNOSIS — M3219 Other organ or system involvement in systemic lupus erythematosus: Secondary | ICD-10-CM | POA: Diagnosis not present

## 2021-10-18 DIAGNOSIS — I509 Heart failure, unspecified: Secondary | ICD-10-CM | POA: Diagnosis not present

## 2021-10-18 DIAGNOSIS — N186 End stage renal disease: Secondary | ICD-10-CM | POA: Diagnosis not present

## 2021-10-20 DIAGNOSIS — E875 Hyperkalemia: Secondary | ICD-10-CM | POA: Diagnosis not present

## 2021-10-20 DIAGNOSIS — I953 Hypotension of hemodialysis: Secondary | ICD-10-CM | POA: Diagnosis not present

## 2021-10-20 DIAGNOSIS — Z992 Dependence on renal dialysis: Secondary | ICD-10-CM | POA: Diagnosis not present

## 2021-10-20 DIAGNOSIS — Z23 Encounter for immunization: Secondary | ICD-10-CM | POA: Diagnosis not present

## 2021-10-20 DIAGNOSIS — N2581 Secondary hyperparathyroidism of renal origin: Secondary | ICD-10-CM | POA: Diagnosis not present

## 2021-10-20 DIAGNOSIS — N186 End stage renal disease: Secondary | ICD-10-CM | POA: Diagnosis not present

## 2021-10-20 DIAGNOSIS — D631 Anemia in chronic kidney disease: Secondary | ICD-10-CM | POA: Diagnosis not present

## 2021-10-21 DIAGNOSIS — Z23 Encounter for immunization: Secondary | ICD-10-CM | POA: Diagnosis not present

## 2021-10-21 DIAGNOSIS — E875 Hyperkalemia: Secondary | ICD-10-CM | POA: Diagnosis not present

## 2021-10-21 DIAGNOSIS — D631 Anemia in chronic kidney disease: Secondary | ICD-10-CM | POA: Diagnosis not present

## 2021-10-21 DIAGNOSIS — Z992 Dependence on renal dialysis: Secondary | ICD-10-CM | POA: Diagnosis not present

## 2021-10-21 DIAGNOSIS — N2581 Secondary hyperparathyroidism of renal origin: Secondary | ICD-10-CM | POA: Diagnosis not present

## 2021-10-21 DIAGNOSIS — N186 End stage renal disease: Secondary | ICD-10-CM | POA: Diagnosis not present

## 2021-10-21 DIAGNOSIS — I953 Hypotension of hemodialysis: Secondary | ICD-10-CM | POA: Diagnosis not present

## 2021-10-22 DIAGNOSIS — Z992 Dependence on renal dialysis: Secondary | ICD-10-CM | POA: Diagnosis not present

## 2021-10-22 DIAGNOSIS — I272 Pulmonary hypertension, unspecified: Secondary | ICD-10-CM | POA: Diagnosis not present

## 2021-10-22 DIAGNOSIS — M329 Systemic lupus erythematosus, unspecified: Secondary | ICD-10-CM | POA: Diagnosis not present

## 2021-10-22 DIAGNOSIS — N186 End stage renal disease: Secondary | ICD-10-CM | POA: Diagnosis not present

## 2021-10-22 DIAGNOSIS — I9589 Other hypotension: Secondary | ICD-10-CM | POA: Diagnosis not present

## 2021-10-24 DIAGNOSIS — N186 End stage renal disease: Secondary | ICD-10-CM | POA: Diagnosis not present

## 2021-10-24 DIAGNOSIS — I953 Hypotension of hemodialysis: Secondary | ICD-10-CM | POA: Diagnosis not present

## 2021-10-24 DIAGNOSIS — E875 Hyperkalemia: Secondary | ICD-10-CM | POA: Diagnosis not present

## 2021-10-24 DIAGNOSIS — Z992 Dependence on renal dialysis: Secondary | ICD-10-CM | POA: Diagnosis not present

## 2021-10-24 DIAGNOSIS — N2581 Secondary hyperparathyroidism of renal origin: Secondary | ICD-10-CM | POA: Diagnosis not present

## 2021-10-24 DIAGNOSIS — D631 Anemia in chronic kidney disease: Secondary | ICD-10-CM | POA: Diagnosis not present

## 2021-10-25 DIAGNOSIS — Z992 Dependence on renal dialysis: Secondary | ICD-10-CM | POA: Diagnosis not present

## 2021-10-25 DIAGNOSIS — I953 Hypotension of hemodialysis: Secondary | ICD-10-CM | POA: Diagnosis not present

## 2021-10-25 DIAGNOSIS — D631 Anemia in chronic kidney disease: Secondary | ICD-10-CM | POA: Diagnosis not present

## 2021-10-25 DIAGNOSIS — N186 End stage renal disease: Secondary | ICD-10-CM | POA: Diagnosis not present

## 2021-10-25 DIAGNOSIS — N2581 Secondary hyperparathyroidism of renal origin: Secondary | ICD-10-CM | POA: Diagnosis not present

## 2021-10-25 DIAGNOSIS — E875 Hyperkalemia: Secondary | ICD-10-CM | POA: Diagnosis not present

## 2021-10-27 DIAGNOSIS — E875 Hyperkalemia: Secondary | ICD-10-CM | POA: Diagnosis not present

## 2021-10-27 DIAGNOSIS — N2581 Secondary hyperparathyroidism of renal origin: Secondary | ICD-10-CM | POA: Diagnosis not present

## 2021-10-27 DIAGNOSIS — D631 Anemia in chronic kidney disease: Secondary | ICD-10-CM | POA: Diagnosis not present

## 2021-10-27 DIAGNOSIS — Z992 Dependence on renal dialysis: Secondary | ICD-10-CM | POA: Diagnosis not present

## 2021-10-27 DIAGNOSIS — I953 Hypotension of hemodialysis: Secondary | ICD-10-CM | POA: Diagnosis not present

## 2021-10-27 DIAGNOSIS — N186 End stage renal disease: Secondary | ICD-10-CM | POA: Diagnosis not present

## 2021-10-28 DIAGNOSIS — N2581 Secondary hyperparathyroidism of renal origin: Secondary | ICD-10-CM | POA: Diagnosis not present

## 2021-10-28 DIAGNOSIS — Z992 Dependence on renal dialysis: Secondary | ICD-10-CM | POA: Diagnosis not present

## 2021-10-28 DIAGNOSIS — E875 Hyperkalemia: Secondary | ICD-10-CM | POA: Diagnosis not present

## 2021-10-28 DIAGNOSIS — N186 End stage renal disease: Secondary | ICD-10-CM | POA: Diagnosis not present

## 2021-10-28 DIAGNOSIS — I953 Hypotension of hemodialysis: Secondary | ICD-10-CM | POA: Diagnosis not present

## 2021-10-28 DIAGNOSIS — D631 Anemia in chronic kidney disease: Secondary | ICD-10-CM | POA: Diagnosis not present

## 2021-10-31 DIAGNOSIS — D631 Anemia in chronic kidney disease: Secondary | ICD-10-CM | POA: Diagnosis not present

## 2021-10-31 DIAGNOSIS — I953 Hypotension of hemodialysis: Secondary | ICD-10-CM | POA: Diagnosis not present

## 2021-10-31 DIAGNOSIS — N2581 Secondary hyperparathyroidism of renal origin: Secondary | ICD-10-CM | POA: Diagnosis not present

## 2021-10-31 DIAGNOSIS — Z992 Dependence on renal dialysis: Secondary | ICD-10-CM | POA: Diagnosis not present

## 2021-10-31 DIAGNOSIS — N186 End stage renal disease: Secondary | ICD-10-CM | POA: Diagnosis not present

## 2021-10-31 DIAGNOSIS — E875 Hyperkalemia: Secondary | ICD-10-CM | POA: Diagnosis not present

## 2021-11-01 DIAGNOSIS — D631 Anemia in chronic kidney disease: Secondary | ICD-10-CM | POA: Diagnosis not present

## 2021-11-01 DIAGNOSIS — N2581 Secondary hyperparathyroidism of renal origin: Secondary | ICD-10-CM | POA: Diagnosis not present

## 2021-11-01 DIAGNOSIS — I953 Hypotension of hemodialysis: Secondary | ICD-10-CM | POA: Diagnosis not present

## 2021-11-01 DIAGNOSIS — Z992 Dependence on renal dialysis: Secondary | ICD-10-CM | POA: Diagnosis not present

## 2021-11-01 DIAGNOSIS — N186 End stage renal disease: Secondary | ICD-10-CM | POA: Diagnosis not present

## 2021-11-01 DIAGNOSIS — E875 Hyperkalemia: Secondary | ICD-10-CM | POA: Diagnosis not present

## 2021-11-03 DIAGNOSIS — N2581 Secondary hyperparathyroidism of renal origin: Secondary | ICD-10-CM | POA: Diagnosis not present

## 2021-11-03 DIAGNOSIS — E875 Hyperkalemia: Secondary | ICD-10-CM | POA: Diagnosis not present

## 2021-11-03 DIAGNOSIS — N186 End stage renal disease: Secondary | ICD-10-CM | POA: Diagnosis not present

## 2021-11-03 DIAGNOSIS — Z992 Dependence on renal dialysis: Secondary | ICD-10-CM | POA: Diagnosis not present

## 2021-11-03 DIAGNOSIS — D631 Anemia in chronic kidney disease: Secondary | ICD-10-CM | POA: Diagnosis not present

## 2021-11-03 DIAGNOSIS — I953 Hypotension of hemodialysis: Secondary | ICD-10-CM | POA: Diagnosis not present

## 2021-11-04 DIAGNOSIS — T82858A Stenosis of vascular prosthetic devices, implants and grafts, initial encounter: Secondary | ICD-10-CM | POA: Diagnosis not present

## 2021-11-04 DIAGNOSIS — I871 Compression of vein: Secondary | ICD-10-CM | POA: Diagnosis not present

## 2021-11-04 DIAGNOSIS — T82898A Other specified complication of vascular prosthetic devices, implants and grafts, initial encounter: Secondary | ICD-10-CM | POA: Diagnosis not present

## 2021-11-04 DIAGNOSIS — T82856A Stenosis of peripheral vascular stent, initial encounter: Secondary | ICD-10-CM | POA: Diagnosis not present

## 2021-11-04 DIAGNOSIS — I120 Hypertensive chronic kidney disease with stage 5 chronic kidney disease or end stage renal disease: Secondary | ICD-10-CM | POA: Diagnosis not present

## 2021-11-04 DIAGNOSIS — N186 End stage renal disease: Secondary | ICD-10-CM | POA: Diagnosis not present

## 2021-11-04 DIAGNOSIS — E875 Hyperkalemia: Secondary | ICD-10-CM | POA: Diagnosis not present

## 2021-11-04 DIAGNOSIS — Z8673 Personal history of transient ischemic attack (TIA), and cerebral infarction without residual deficits: Secondary | ICD-10-CM | POA: Diagnosis not present

## 2021-11-04 DIAGNOSIS — D631 Anemia in chronic kidney disease: Secondary | ICD-10-CM | POA: Diagnosis not present

## 2021-11-04 DIAGNOSIS — I953 Hypotension of hemodialysis: Secondary | ICD-10-CM | POA: Diagnosis not present

## 2021-11-04 DIAGNOSIS — Z992 Dependence on renal dialysis: Secondary | ICD-10-CM | POA: Diagnosis not present

## 2021-11-04 DIAGNOSIS — T82838A Hemorrhage of vascular prosthetic devices, implants and grafts, initial encounter: Secondary | ICD-10-CM | POA: Diagnosis not present

## 2021-11-04 DIAGNOSIS — N2581 Secondary hyperparathyroidism of renal origin: Secondary | ICD-10-CM | POA: Diagnosis not present

## 2021-11-07 DIAGNOSIS — I953 Hypotension of hemodialysis: Secondary | ICD-10-CM | POA: Diagnosis not present

## 2021-11-07 DIAGNOSIS — E875 Hyperkalemia: Secondary | ICD-10-CM | POA: Diagnosis not present

## 2021-11-07 DIAGNOSIS — N2581 Secondary hyperparathyroidism of renal origin: Secondary | ICD-10-CM | POA: Diagnosis not present

## 2021-11-07 DIAGNOSIS — N186 End stage renal disease: Secondary | ICD-10-CM | POA: Diagnosis not present

## 2021-11-07 DIAGNOSIS — D631 Anemia in chronic kidney disease: Secondary | ICD-10-CM | POA: Diagnosis not present

## 2021-11-07 DIAGNOSIS — Z992 Dependence on renal dialysis: Secondary | ICD-10-CM | POA: Diagnosis not present

## 2021-11-08 DIAGNOSIS — N186 End stage renal disease: Secondary | ICD-10-CM | POA: Diagnosis not present

## 2021-11-08 DIAGNOSIS — D631 Anemia in chronic kidney disease: Secondary | ICD-10-CM | POA: Diagnosis not present

## 2021-11-08 DIAGNOSIS — I953 Hypotension of hemodialysis: Secondary | ICD-10-CM | POA: Diagnosis not present

## 2021-11-08 DIAGNOSIS — E875 Hyperkalemia: Secondary | ICD-10-CM | POA: Diagnosis not present

## 2021-11-08 DIAGNOSIS — Z992 Dependence on renal dialysis: Secondary | ICD-10-CM | POA: Diagnosis not present

## 2021-11-08 DIAGNOSIS — N2581 Secondary hyperparathyroidism of renal origin: Secondary | ICD-10-CM | POA: Diagnosis not present

## 2021-11-10 DIAGNOSIS — Z992 Dependence on renal dialysis: Secondary | ICD-10-CM | POA: Diagnosis not present

## 2021-11-10 DIAGNOSIS — E875 Hyperkalemia: Secondary | ICD-10-CM | POA: Diagnosis not present

## 2021-11-10 DIAGNOSIS — N186 End stage renal disease: Secondary | ICD-10-CM | POA: Diagnosis not present

## 2021-11-10 DIAGNOSIS — N2581 Secondary hyperparathyroidism of renal origin: Secondary | ICD-10-CM | POA: Diagnosis not present

## 2021-11-10 DIAGNOSIS — I953 Hypotension of hemodialysis: Secondary | ICD-10-CM | POA: Diagnosis not present

## 2021-11-10 DIAGNOSIS — D631 Anemia in chronic kidney disease: Secondary | ICD-10-CM | POA: Diagnosis not present

## 2021-11-11 DIAGNOSIS — Z992 Dependence on renal dialysis: Secondary | ICD-10-CM | POA: Diagnosis not present

## 2021-11-11 DIAGNOSIS — I953 Hypotension of hemodialysis: Secondary | ICD-10-CM | POA: Diagnosis not present

## 2021-11-11 DIAGNOSIS — D631 Anemia in chronic kidney disease: Secondary | ICD-10-CM | POA: Diagnosis not present

## 2021-11-11 DIAGNOSIS — N186 End stage renal disease: Secondary | ICD-10-CM | POA: Diagnosis not present

## 2021-11-11 DIAGNOSIS — N2581 Secondary hyperparathyroidism of renal origin: Secondary | ICD-10-CM | POA: Diagnosis not present

## 2021-11-11 DIAGNOSIS — E875 Hyperkalemia: Secondary | ICD-10-CM | POA: Diagnosis not present

## 2021-11-14 DIAGNOSIS — N2581 Secondary hyperparathyroidism of renal origin: Secondary | ICD-10-CM | POA: Diagnosis not present

## 2021-11-14 DIAGNOSIS — I953 Hypotension of hemodialysis: Secondary | ICD-10-CM | POA: Diagnosis not present

## 2021-11-14 DIAGNOSIS — D631 Anemia in chronic kidney disease: Secondary | ICD-10-CM | POA: Diagnosis not present

## 2021-11-14 DIAGNOSIS — E875 Hyperkalemia: Secondary | ICD-10-CM | POA: Diagnosis not present

## 2021-11-14 DIAGNOSIS — Z992 Dependence on renal dialysis: Secondary | ICD-10-CM | POA: Diagnosis not present

## 2021-11-14 DIAGNOSIS — N186 End stage renal disease: Secondary | ICD-10-CM | POA: Diagnosis not present

## 2021-11-15 DIAGNOSIS — E875 Hyperkalemia: Secondary | ICD-10-CM | POA: Diagnosis not present

## 2021-11-15 DIAGNOSIS — D631 Anemia in chronic kidney disease: Secondary | ICD-10-CM | POA: Diagnosis not present

## 2021-11-15 DIAGNOSIS — N186 End stage renal disease: Secondary | ICD-10-CM | POA: Diagnosis not present

## 2021-11-15 DIAGNOSIS — I953 Hypotension of hemodialysis: Secondary | ICD-10-CM | POA: Diagnosis not present

## 2021-11-15 DIAGNOSIS — Z992 Dependence on renal dialysis: Secondary | ICD-10-CM | POA: Diagnosis not present

## 2021-11-15 DIAGNOSIS — N2581 Secondary hyperparathyroidism of renal origin: Secondary | ICD-10-CM | POA: Diagnosis not present

## 2021-11-17 DIAGNOSIS — Z992 Dependence on renal dialysis: Secondary | ICD-10-CM | POA: Diagnosis not present

## 2021-11-17 DIAGNOSIS — N2581 Secondary hyperparathyroidism of renal origin: Secondary | ICD-10-CM | POA: Diagnosis not present

## 2021-11-17 DIAGNOSIS — N186 End stage renal disease: Secondary | ICD-10-CM | POA: Diagnosis not present

## 2021-11-17 DIAGNOSIS — I953 Hypotension of hemodialysis: Secondary | ICD-10-CM | POA: Diagnosis not present

## 2021-11-17 DIAGNOSIS — D631 Anemia in chronic kidney disease: Secondary | ICD-10-CM | POA: Diagnosis not present

## 2021-11-17 DIAGNOSIS — E875 Hyperkalemia: Secondary | ICD-10-CM | POA: Diagnosis not present

## 2021-11-18 DIAGNOSIS — I953 Hypotension of hemodialysis: Secondary | ICD-10-CM | POA: Diagnosis not present

## 2021-11-18 DIAGNOSIS — D631 Anemia in chronic kidney disease: Secondary | ICD-10-CM | POA: Diagnosis not present

## 2021-11-18 DIAGNOSIS — N2581 Secondary hyperparathyroidism of renal origin: Secondary | ICD-10-CM | POA: Diagnosis not present

## 2021-11-18 DIAGNOSIS — I509 Heart failure, unspecified: Secondary | ICD-10-CM | POA: Diagnosis not present

## 2021-11-18 DIAGNOSIS — Z992 Dependence on renal dialysis: Secondary | ICD-10-CM | POA: Diagnosis not present

## 2021-11-18 DIAGNOSIS — I272 Pulmonary hypertension, unspecified: Secondary | ICD-10-CM | POA: Diagnosis not present

## 2021-11-18 DIAGNOSIS — M3219 Other organ or system involvement in systemic lupus erythematosus: Secondary | ICD-10-CM | POA: Diagnosis not present

## 2021-11-18 DIAGNOSIS — E875 Hyperkalemia: Secondary | ICD-10-CM | POA: Diagnosis not present

## 2021-11-18 DIAGNOSIS — N186 End stage renal disease: Secondary | ICD-10-CM | POA: Diagnosis not present

## 2021-11-21 DIAGNOSIS — D631 Anemia in chronic kidney disease: Secondary | ICD-10-CM | POA: Diagnosis not present

## 2021-11-21 DIAGNOSIS — N186 End stage renal disease: Secondary | ICD-10-CM | POA: Diagnosis not present

## 2021-11-21 DIAGNOSIS — E875 Hyperkalemia: Secondary | ICD-10-CM | POA: Diagnosis not present

## 2021-11-21 DIAGNOSIS — Z992 Dependence on renal dialysis: Secondary | ICD-10-CM | POA: Diagnosis not present

## 2021-11-21 DIAGNOSIS — I953 Hypotension of hemodialysis: Secondary | ICD-10-CM | POA: Diagnosis not present

## 2021-11-21 DIAGNOSIS — N2581 Secondary hyperparathyroidism of renal origin: Secondary | ICD-10-CM | POA: Diagnosis not present

## 2021-11-22 DIAGNOSIS — D631 Anemia in chronic kidney disease: Secondary | ICD-10-CM | POA: Diagnosis not present

## 2021-11-22 DIAGNOSIS — N186 End stage renal disease: Secondary | ICD-10-CM | POA: Diagnosis not present

## 2021-11-22 DIAGNOSIS — Z992 Dependence on renal dialysis: Secondary | ICD-10-CM | POA: Diagnosis not present

## 2021-11-22 DIAGNOSIS — I9589 Other hypotension: Secondary | ICD-10-CM | POA: Diagnosis not present

## 2021-11-22 DIAGNOSIS — M329 Systemic lupus erythematosus, unspecified: Secondary | ICD-10-CM | POA: Diagnosis not present

## 2021-11-22 DIAGNOSIS — I272 Pulmonary hypertension, unspecified: Secondary | ICD-10-CM | POA: Diagnosis not present

## 2021-11-22 DIAGNOSIS — E875 Hyperkalemia: Secondary | ICD-10-CM | POA: Diagnosis not present

## 2021-11-22 DIAGNOSIS — N2581 Secondary hyperparathyroidism of renal origin: Secondary | ICD-10-CM | POA: Diagnosis not present

## 2021-11-22 DIAGNOSIS — I953 Hypotension of hemodialysis: Secondary | ICD-10-CM | POA: Diagnosis not present

## 2021-11-24 DIAGNOSIS — E875 Hyperkalemia: Secondary | ICD-10-CM | POA: Diagnosis not present

## 2021-11-24 DIAGNOSIS — I953 Hypotension of hemodialysis: Secondary | ICD-10-CM | POA: Diagnosis not present

## 2021-11-24 DIAGNOSIS — N2581 Secondary hyperparathyroidism of renal origin: Secondary | ICD-10-CM | POA: Diagnosis not present

## 2021-11-24 DIAGNOSIS — D631 Anemia in chronic kidney disease: Secondary | ICD-10-CM | POA: Diagnosis not present

## 2021-11-24 DIAGNOSIS — Z992 Dependence on renal dialysis: Secondary | ICD-10-CM | POA: Diagnosis not present

## 2021-11-24 DIAGNOSIS — N186 End stage renal disease: Secondary | ICD-10-CM | POA: Diagnosis not present

## 2021-11-25 DIAGNOSIS — D631 Anemia in chronic kidney disease: Secondary | ICD-10-CM | POA: Diagnosis not present

## 2021-11-25 DIAGNOSIS — Z992 Dependence on renal dialysis: Secondary | ICD-10-CM | POA: Diagnosis not present

## 2021-11-25 DIAGNOSIS — E875 Hyperkalemia: Secondary | ICD-10-CM | POA: Diagnosis not present

## 2021-11-25 DIAGNOSIS — N186 End stage renal disease: Secondary | ICD-10-CM | POA: Diagnosis not present

## 2021-11-25 DIAGNOSIS — N2581 Secondary hyperparathyroidism of renal origin: Secondary | ICD-10-CM | POA: Diagnosis not present

## 2021-11-25 DIAGNOSIS — I953 Hypotension of hemodialysis: Secondary | ICD-10-CM | POA: Diagnosis not present

## 2021-11-28 DIAGNOSIS — Z992 Dependence on renal dialysis: Secondary | ICD-10-CM | POA: Diagnosis not present

## 2021-11-28 DIAGNOSIS — E875 Hyperkalemia: Secondary | ICD-10-CM | POA: Diagnosis not present

## 2021-11-28 DIAGNOSIS — I953 Hypotension of hemodialysis: Secondary | ICD-10-CM | POA: Diagnosis not present

## 2021-11-28 DIAGNOSIS — N2581 Secondary hyperparathyroidism of renal origin: Secondary | ICD-10-CM | POA: Diagnosis not present

## 2021-11-28 DIAGNOSIS — D631 Anemia in chronic kidney disease: Secondary | ICD-10-CM | POA: Diagnosis not present

## 2021-11-28 DIAGNOSIS — N186 End stage renal disease: Secondary | ICD-10-CM | POA: Diagnosis not present

## 2021-11-29 DIAGNOSIS — N186 End stage renal disease: Secondary | ICD-10-CM | POA: Diagnosis not present

## 2021-11-29 DIAGNOSIS — D631 Anemia in chronic kidney disease: Secondary | ICD-10-CM | POA: Diagnosis not present

## 2021-11-29 DIAGNOSIS — Z992 Dependence on renal dialysis: Secondary | ICD-10-CM | POA: Diagnosis not present

## 2021-11-29 DIAGNOSIS — E875 Hyperkalemia: Secondary | ICD-10-CM | POA: Diagnosis not present

## 2021-11-29 DIAGNOSIS — N2581 Secondary hyperparathyroidism of renal origin: Secondary | ICD-10-CM | POA: Diagnosis not present

## 2021-11-29 DIAGNOSIS — I953 Hypotension of hemodialysis: Secondary | ICD-10-CM | POA: Diagnosis not present

## 2021-11-30 DIAGNOSIS — N186 End stage renal disease: Secondary | ICD-10-CM | POA: Diagnosis not present

## 2021-11-30 DIAGNOSIS — I953 Hypotension of hemodialysis: Secondary | ICD-10-CM | POA: Diagnosis not present

## 2021-11-30 DIAGNOSIS — N2581 Secondary hyperparathyroidism of renal origin: Secondary | ICD-10-CM | POA: Diagnosis not present

## 2021-11-30 DIAGNOSIS — D631 Anemia in chronic kidney disease: Secondary | ICD-10-CM | POA: Diagnosis not present

## 2021-11-30 DIAGNOSIS — E875 Hyperkalemia: Secondary | ICD-10-CM | POA: Diagnosis not present

## 2021-11-30 DIAGNOSIS — Z992 Dependence on renal dialysis: Secondary | ICD-10-CM | POA: Diagnosis not present

## 2021-12-01 DIAGNOSIS — E875 Hyperkalemia: Secondary | ICD-10-CM | POA: Diagnosis not present

## 2021-12-01 DIAGNOSIS — D631 Anemia in chronic kidney disease: Secondary | ICD-10-CM | POA: Diagnosis not present

## 2021-12-01 DIAGNOSIS — Z992 Dependence on renal dialysis: Secondary | ICD-10-CM | POA: Diagnosis not present

## 2021-12-01 DIAGNOSIS — N2581 Secondary hyperparathyroidism of renal origin: Secondary | ICD-10-CM | POA: Diagnosis not present

## 2021-12-01 DIAGNOSIS — I953 Hypotension of hemodialysis: Secondary | ICD-10-CM | POA: Diagnosis not present

## 2021-12-01 DIAGNOSIS — N186 End stage renal disease: Secondary | ICD-10-CM | POA: Diagnosis not present

## 2021-12-02 DIAGNOSIS — N186 End stage renal disease: Secondary | ICD-10-CM | POA: Diagnosis not present

## 2021-12-02 DIAGNOSIS — D631 Anemia in chronic kidney disease: Secondary | ICD-10-CM | POA: Diagnosis not present

## 2021-12-02 DIAGNOSIS — Z992 Dependence on renal dialysis: Secondary | ICD-10-CM | POA: Diagnosis not present

## 2021-12-02 DIAGNOSIS — E875 Hyperkalemia: Secondary | ICD-10-CM | POA: Diagnosis not present

## 2021-12-02 DIAGNOSIS — I953 Hypotension of hemodialysis: Secondary | ICD-10-CM | POA: Diagnosis not present

## 2021-12-02 DIAGNOSIS — N2581 Secondary hyperparathyroidism of renal origin: Secondary | ICD-10-CM | POA: Diagnosis not present

## 2021-12-05 DIAGNOSIS — Z992 Dependence on renal dialysis: Secondary | ICD-10-CM | POA: Diagnosis not present

## 2021-12-05 DIAGNOSIS — D631 Anemia in chronic kidney disease: Secondary | ICD-10-CM | POA: Diagnosis not present

## 2021-12-05 DIAGNOSIS — N2581 Secondary hyperparathyroidism of renal origin: Secondary | ICD-10-CM | POA: Diagnosis not present

## 2021-12-05 DIAGNOSIS — E875 Hyperkalemia: Secondary | ICD-10-CM | POA: Diagnosis not present

## 2021-12-05 DIAGNOSIS — I953 Hypotension of hemodialysis: Secondary | ICD-10-CM | POA: Diagnosis not present

## 2021-12-05 DIAGNOSIS — N186 End stage renal disease: Secondary | ICD-10-CM | POA: Diagnosis not present

## 2021-12-06 DIAGNOSIS — E875 Hyperkalemia: Secondary | ICD-10-CM | POA: Diagnosis not present

## 2021-12-06 DIAGNOSIS — N2581 Secondary hyperparathyroidism of renal origin: Secondary | ICD-10-CM | POA: Diagnosis not present

## 2021-12-06 DIAGNOSIS — Z992 Dependence on renal dialysis: Secondary | ICD-10-CM | POA: Diagnosis not present

## 2021-12-06 DIAGNOSIS — D631 Anemia in chronic kidney disease: Secondary | ICD-10-CM | POA: Diagnosis not present

## 2021-12-06 DIAGNOSIS — N186 End stage renal disease: Secondary | ICD-10-CM | POA: Diagnosis not present

## 2021-12-06 DIAGNOSIS — I953 Hypotension of hemodialysis: Secondary | ICD-10-CM | POA: Diagnosis not present

## 2021-12-08 DIAGNOSIS — E875 Hyperkalemia: Secondary | ICD-10-CM | POA: Diagnosis not present

## 2021-12-08 DIAGNOSIS — N186 End stage renal disease: Secondary | ICD-10-CM | POA: Diagnosis not present

## 2021-12-08 DIAGNOSIS — Z992 Dependence on renal dialysis: Secondary | ICD-10-CM | POA: Diagnosis not present

## 2021-12-08 DIAGNOSIS — I953 Hypotension of hemodialysis: Secondary | ICD-10-CM | POA: Diagnosis not present

## 2021-12-08 DIAGNOSIS — D631 Anemia in chronic kidney disease: Secondary | ICD-10-CM | POA: Diagnosis not present

## 2021-12-08 DIAGNOSIS — N2581 Secondary hyperparathyroidism of renal origin: Secondary | ICD-10-CM | POA: Diagnosis not present

## 2021-12-09 DIAGNOSIS — E875 Hyperkalemia: Secondary | ICD-10-CM | POA: Diagnosis not present

## 2021-12-09 DIAGNOSIS — Z992 Dependence on renal dialysis: Secondary | ICD-10-CM | POA: Diagnosis not present

## 2021-12-09 DIAGNOSIS — N186 End stage renal disease: Secondary | ICD-10-CM | POA: Diagnosis not present

## 2021-12-09 DIAGNOSIS — I953 Hypotension of hemodialysis: Secondary | ICD-10-CM | POA: Diagnosis not present

## 2021-12-09 DIAGNOSIS — N2581 Secondary hyperparathyroidism of renal origin: Secondary | ICD-10-CM | POA: Diagnosis not present

## 2021-12-09 DIAGNOSIS — D631 Anemia in chronic kidney disease: Secondary | ICD-10-CM | POA: Diagnosis not present

## 2021-12-12 DIAGNOSIS — D631 Anemia in chronic kidney disease: Secondary | ICD-10-CM | POA: Diagnosis not present

## 2021-12-12 DIAGNOSIS — I953 Hypotension of hemodialysis: Secondary | ICD-10-CM | POA: Diagnosis not present

## 2021-12-12 DIAGNOSIS — N2581 Secondary hyperparathyroidism of renal origin: Secondary | ICD-10-CM | POA: Diagnosis not present

## 2021-12-12 DIAGNOSIS — Z992 Dependence on renal dialysis: Secondary | ICD-10-CM | POA: Diagnosis not present

## 2021-12-12 DIAGNOSIS — N186 End stage renal disease: Secondary | ICD-10-CM | POA: Diagnosis not present

## 2021-12-12 DIAGNOSIS — E875 Hyperkalemia: Secondary | ICD-10-CM | POA: Diagnosis not present

## 2021-12-13 DIAGNOSIS — N2581 Secondary hyperparathyroidism of renal origin: Secondary | ICD-10-CM | POA: Diagnosis not present

## 2021-12-13 DIAGNOSIS — Z992 Dependence on renal dialysis: Secondary | ICD-10-CM | POA: Diagnosis not present

## 2021-12-13 DIAGNOSIS — N186 End stage renal disease: Secondary | ICD-10-CM | POA: Diagnosis not present

## 2021-12-13 DIAGNOSIS — E875 Hyperkalemia: Secondary | ICD-10-CM | POA: Diagnosis not present

## 2021-12-13 DIAGNOSIS — D631 Anemia in chronic kidney disease: Secondary | ICD-10-CM | POA: Diagnosis not present

## 2021-12-13 DIAGNOSIS — I953 Hypotension of hemodialysis: Secondary | ICD-10-CM | POA: Diagnosis not present

## 2021-12-15 DIAGNOSIS — D631 Anemia in chronic kidney disease: Secondary | ICD-10-CM | POA: Diagnosis not present

## 2021-12-15 DIAGNOSIS — N186 End stage renal disease: Secondary | ICD-10-CM | POA: Diagnosis not present

## 2021-12-15 DIAGNOSIS — E875 Hyperkalemia: Secondary | ICD-10-CM | POA: Diagnosis not present

## 2021-12-15 DIAGNOSIS — N2581 Secondary hyperparathyroidism of renal origin: Secondary | ICD-10-CM | POA: Diagnosis not present

## 2021-12-15 DIAGNOSIS — I953 Hypotension of hemodialysis: Secondary | ICD-10-CM | POA: Diagnosis not present

## 2021-12-15 DIAGNOSIS — Z992 Dependence on renal dialysis: Secondary | ICD-10-CM | POA: Diagnosis not present

## 2021-12-16 DIAGNOSIS — E875 Hyperkalemia: Secondary | ICD-10-CM | POA: Diagnosis not present

## 2021-12-16 DIAGNOSIS — N2581 Secondary hyperparathyroidism of renal origin: Secondary | ICD-10-CM | POA: Diagnosis not present

## 2021-12-16 DIAGNOSIS — N186 End stage renal disease: Secondary | ICD-10-CM | POA: Diagnosis not present

## 2021-12-16 DIAGNOSIS — Z992 Dependence on renal dialysis: Secondary | ICD-10-CM | POA: Diagnosis not present

## 2021-12-16 DIAGNOSIS — I953 Hypotension of hemodialysis: Secondary | ICD-10-CM | POA: Diagnosis not present

## 2021-12-16 DIAGNOSIS — D631 Anemia in chronic kidney disease: Secondary | ICD-10-CM | POA: Diagnosis not present

## 2021-12-19 DIAGNOSIS — M3219 Other organ or system involvement in systemic lupus erythematosus: Secondary | ICD-10-CM | POA: Diagnosis not present

## 2021-12-19 DIAGNOSIS — I953 Hypotension of hemodialysis: Secondary | ICD-10-CM | POA: Diagnosis not present

## 2021-12-19 DIAGNOSIS — D631 Anemia in chronic kidney disease: Secondary | ICD-10-CM | POA: Diagnosis not present

## 2021-12-19 DIAGNOSIS — N2581 Secondary hyperparathyroidism of renal origin: Secondary | ICD-10-CM | POA: Diagnosis not present

## 2021-12-19 DIAGNOSIS — Z992 Dependence on renal dialysis: Secondary | ICD-10-CM | POA: Diagnosis not present

## 2021-12-19 DIAGNOSIS — E875 Hyperkalemia: Secondary | ICD-10-CM | POA: Diagnosis not present

## 2021-12-19 DIAGNOSIS — N186 End stage renal disease: Secondary | ICD-10-CM | POA: Diagnosis not present

## 2021-12-19 DIAGNOSIS — I509 Heart failure, unspecified: Secondary | ICD-10-CM | POA: Diagnosis not present

## 2021-12-19 DIAGNOSIS — I272 Pulmonary hypertension, unspecified: Secondary | ICD-10-CM | POA: Diagnosis not present

## 2021-12-20 DIAGNOSIS — I953 Hypotension of hemodialysis: Secondary | ICD-10-CM | POA: Diagnosis not present

## 2021-12-20 DIAGNOSIS — M329 Systemic lupus erythematosus, unspecified: Secondary | ICD-10-CM | POA: Diagnosis not present

## 2021-12-20 DIAGNOSIS — I120 Hypertensive chronic kidney disease with stage 5 chronic kidney disease or end stage renal disease: Secondary | ICD-10-CM | POA: Diagnosis not present

## 2021-12-20 DIAGNOSIS — N186 End stage renal disease: Secondary | ICD-10-CM | POA: Diagnosis not present

## 2021-12-20 DIAGNOSIS — Z992 Dependence on renal dialysis: Secondary | ICD-10-CM | POA: Diagnosis not present

## 2021-12-20 DIAGNOSIS — N2581 Secondary hyperparathyroidism of renal origin: Secondary | ICD-10-CM | POA: Diagnosis not present

## 2021-12-20 DIAGNOSIS — D631 Anemia in chronic kidney disease: Secondary | ICD-10-CM | POA: Diagnosis not present

## 2021-12-20 DIAGNOSIS — E875 Hyperkalemia: Secondary | ICD-10-CM | POA: Diagnosis not present

## 2021-12-22 DIAGNOSIS — D631 Anemia in chronic kidney disease: Secondary | ICD-10-CM | POA: Diagnosis not present

## 2021-12-22 DIAGNOSIS — E875 Hyperkalemia: Secondary | ICD-10-CM | POA: Diagnosis not present

## 2021-12-22 DIAGNOSIS — N2581 Secondary hyperparathyroidism of renal origin: Secondary | ICD-10-CM | POA: Diagnosis not present

## 2021-12-22 DIAGNOSIS — I953 Hypotension of hemodialysis: Secondary | ICD-10-CM | POA: Diagnosis not present

## 2021-12-22 DIAGNOSIS — Z992 Dependence on renal dialysis: Secondary | ICD-10-CM | POA: Diagnosis not present

## 2021-12-22 DIAGNOSIS — N186 End stage renal disease: Secondary | ICD-10-CM | POA: Diagnosis not present

## 2021-12-23 DIAGNOSIS — D631 Anemia in chronic kidney disease: Secondary | ICD-10-CM | POA: Diagnosis not present

## 2021-12-23 DIAGNOSIS — I953 Hypotension of hemodialysis: Secondary | ICD-10-CM | POA: Diagnosis not present

## 2021-12-23 DIAGNOSIS — N2581 Secondary hyperparathyroidism of renal origin: Secondary | ICD-10-CM | POA: Diagnosis not present

## 2021-12-23 DIAGNOSIS — Z992 Dependence on renal dialysis: Secondary | ICD-10-CM | POA: Diagnosis not present

## 2021-12-23 DIAGNOSIS — N186 End stage renal disease: Secondary | ICD-10-CM | POA: Diagnosis not present

## 2021-12-23 DIAGNOSIS — E875 Hyperkalemia: Secondary | ICD-10-CM | POA: Diagnosis not present

## 2021-12-26 DIAGNOSIS — Z7682 Awaiting organ transplant status: Secondary | ICD-10-CM | POA: Diagnosis not present

## 2021-12-26 DIAGNOSIS — Z992 Dependence on renal dialysis: Secondary | ICD-10-CM | POA: Diagnosis not present

## 2021-12-26 DIAGNOSIS — N2581 Secondary hyperparathyroidism of renal origin: Secondary | ICD-10-CM | POA: Diagnosis not present

## 2021-12-26 DIAGNOSIS — E875 Hyperkalemia: Secondary | ICD-10-CM | POA: Diagnosis not present

## 2021-12-26 DIAGNOSIS — D631 Anemia in chronic kidney disease: Secondary | ICD-10-CM | POA: Diagnosis not present

## 2021-12-26 DIAGNOSIS — I953 Hypotension of hemodialysis: Secondary | ICD-10-CM | POA: Diagnosis not present

## 2021-12-26 DIAGNOSIS — N186 End stage renal disease: Secondary | ICD-10-CM | POA: Diagnosis not present

## 2021-12-27 DIAGNOSIS — E875 Hyperkalemia: Secondary | ICD-10-CM | POA: Diagnosis not present

## 2021-12-27 DIAGNOSIS — D631 Anemia in chronic kidney disease: Secondary | ICD-10-CM | POA: Diagnosis not present

## 2021-12-27 DIAGNOSIS — N2581 Secondary hyperparathyroidism of renal origin: Secondary | ICD-10-CM | POA: Diagnosis not present

## 2021-12-27 DIAGNOSIS — Z992 Dependence on renal dialysis: Secondary | ICD-10-CM | POA: Diagnosis not present

## 2021-12-27 DIAGNOSIS — N186 End stage renal disease: Secondary | ICD-10-CM | POA: Diagnosis not present

## 2021-12-27 DIAGNOSIS — I953 Hypotension of hemodialysis: Secondary | ICD-10-CM | POA: Diagnosis not present

## 2021-12-29 DIAGNOSIS — D631 Anemia in chronic kidney disease: Secondary | ICD-10-CM | POA: Diagnosis not present

## 2021-12-29 DIAGNOSIS — Z992 Dependence on renal dialysis: Secondary | ICD-10-CM | POA: Diagnosis not present

## 2021-12-29 DIAGNOSIS — N186 End stage renal disease: Secondary | ICD-10-CM | POA: Diagnosis not present

## 2021-12-29 DIAGNOSIS — E875 Hyperkalemia: Secondary | ICD-10-CM | POA: Diagnosis not present

## 2021-12-29 DIAGNOSIS — I953 Hypotension of hemodialysis: Secondary | ICD-10-CM | POA: Diagnosis not present

## 2021-12-29 DIAGNOSIS — N2581 Secondary hyperparathyroidism of renal origin: Secondary | ICD-10-CM | POA: Diagnosis not present

## 2021-12-30 DIAGNOSIS — E875 Hyperkalemia: Secondary | ICD-10-CM | POA: Diagnosis not present

## 2021-12-30 DIAGNOSIS — N186 End stage renal disease: Secondary | ICD-10-CM | POA: Diagnosis not present

## 2021-12-30 DIAGNOSIS — Z992 Dependence on renal dialysis: Secondary | ICD-10-CM | POA: Diagnosis not present

## 2021-12-30 DIAGNOSIS — D631 Anemia in chronic kidney disease: Secondary | ICD-10-CM | POA: Diagnosis not present

## 2021-12-30 DIAGNOSIS — N2581 Secondary hyperparathyroidism of renal origin: Secondary | ICD-10-CM | POA: Diagnosis not present

## 2021-12-30 DIAGNOSIS — I953 Hypotension of hemodialysis: Secondary | ICD-10-CM | POA: Diagnosis not present

## 2022-01-02 DIAGNOSIS — D631 Anemia in chronic kidney disease: Secondary | ICD-10-CM | POA: Diagnosis not present

## 2022-01-02 DIAGNOSIS — N186 End stage renal disease: Secondary | ICD-10-CM | POA: Diagnosis not present

## 2022-01-02 DIAGNOSIS — N2581 Secondary hyperparathyroidism of renal origin: Secondary | ICD-10-CM | POA: Diagnosis not present

## 2022-01-02 DIAGNOSIS — E875 Hyperkalemia: Secondary | ICD-10-CM | POA: Diagnosis not present

## 2022-01-02 DIAGNOSIS — Z992 Dependence on renal dialysis: Secondary | ICD-10-CM | POA: Diagnosis not present

## 2022-01-02 DIAGNOSIS — I953 Hypotension of hemodialysis: Secondary | ICD-10-CM | POA: Diagnosis not present

## 2022-01-03 DIAGNOSIS — N186 End stage renal disease: Secondary | ICD-10-CM | POA: Diagnosis not present

## 2022-01-03 DIAGNOSIS — N2581 Secondary hyperparathyroidism of renal origin: Secondary | ICD-10-CM | POA: Diagnosis not present

## 2022-01-03 DIAGNOSIS — D631 Anemia in chronic kidney disease: Secondary | ICD-10-CM | POA: Diagnosis not present

## 2022-01-03 DIAGNOSIS — Z992 Dependence on renal dialysis: Secondary | ICD-10-CM | POA: Diagnosis not present

## 2022-01-03 DIAGNOSIS — I953 Hypotension of hemodialysis: Secondary | ICD-10-CM | POA: Diagnosis not present

## 2022-01-03 DIAGNOSIS — E875 Hyperkalemia: Secondary | ICD-10-CM | POA: Diagnosis not present

## 2022-01-05 DIAGNOSIS — E875 Hyperkalemia: Secondary | ICD-10-CM | POA: Diagnosis not present

## 2022-01-05 DIAGNOSIS — I953 Hypotension of hemodialysis: Secondary | ICD-10-CM | POA: Diagnosis not present

## 2022-01-05 DIAGNOSIS — Z992 Dependence on renal dialysis: Secondary | ICD-10-CM | POA: Diagnosis not present

## 2022-01-05 DIAGNOSIS — N2581 Secondary hyperparathyroidism of renal origin: Secondary | ICD-10-CM | POA: Diagnosis not present

## 2022-01-05 DIAGNOSIS — D631 Anemia in chronic kidney disease: Secondary | ICD-10-CM | POA: Diagnosis not present

## 2022-01-05 DIAGNOSIS — N186 End stage renal disease: Secondary | ICD-10-CM | POA: Diagnosis not present

## 2022-01-06 DIAGNOSIS — E875 Hyperkalemia: Secondary | ICD-10-CM | POA: Diagnosis not present

## 2022-01-06 DIAGNOSIS — Z992 Dependence on renal dialysis: Secondary | ICD-10-CM | POA: Diagnosis not present

## 2022-01-06 DIAGNOSIS — N186 End stage renal disease: Secondary | ICD-10-CM | POA: Diagnosis not present

## 2022-01-06 DIAGNOSIS — I953 Hypotension of hemodialysis: Secondary | ICD-10-CM | POA: Diagnosis not present

## 2022-01-06 DIAGNOSIS — N2581 Secondary hyperparathyroidism of renal origin: Secondary | ICD-10-CM | POA: Diagnosis not present

## 2022-01-06 DIAGNOSIS — D631 Anemia in chronic kidney disease: Secondary | ICD-10-CM | POA: Diagnosis not present

## 2022-01-09 DIAGNOSIS — N2581 Secondary hyperparathyroidism of renal origin: Secondary | ICD-10-CM | POA: Diagnosis not present

## 2022-01-09 DIAGNOSIS — N186 End stage renal disease: Secondary | ICD-10-CM | POA: Diagnosis not present

## 2022-01-09 DIAGNOSIS — Z992 Dependence on renal dialysis: Secondary | ICD-10-CM | POA: Diagnosis not present

## 2022-01-09 DIAGNOSIS — D631 Anemia in chronic kidney disease: Secondary | ICD-10-CM | POA: Diagnosis not present

## 2022-01-09 DIAGNOSIS — E875 Hyperkalemia: Secondary | ICD-10-CM | POA: Diagnosis not present

## 2022-01-09 DIAGNOSIS — I953 Hypotension of hemodialysis: Secondary | ICD-10-CM | POA: Diagnosis not present

## 2022-01-10 DIAGNOSIS — N2581 Secondary hyperparathyroidism of renal origin: Secondary | ICD-10-CM | POA: Diagnosis not present

## 2022-01-10 DIAGNOSIS — M3214 Glomerular disease in systemic lupus erythematosus: Secondary | ICD-10-CM | POA: Diagnosis not present

## 2022-01-10 DIAGNOSIS — D241 Benign neoplasm of right breast: Secondary | ICD-10-CM | POA: Diagnosis not present

## 2022-01-10 DIAGNOSIS — K660 Peritoneal adhesions (postprocedural) (postinfection): Secondary | ICD-10-CM | POA: Diagnosis not present

## 2022-01-10 DIAGNOSIS — E875 Hyperkalemia: Secondary | ICD-10-CM | POA: Diagnosis not present

## 2022-01-10 DIAGNOSIS — D84821 Immunodeficiency due to drugs: Secondary | ICD-10-CM | POA: Diagnosis not present

## 2022-01-10 DIAGNOSIS — Z992 Dependence on renal dialysis: Secondary | ICD-10-CM | POA: Diagnosis not present

## 2022-01-10 DIAGNOSIS — N186 End stage renal disease: Secondary | ICD-10-CM | POA: Diagnosis not present

## 2022-01-10 DIAGNOSIS — Z888 Allergy status to other drugs, medicaments and biological substances status: Secondary | ICD-10-CM | POA: Diagnosis not present

## 2022-01-10 DIAGNOSIS — Z7682 Awaiting organ transplant status: Secondary | ICD-10-CM | POA: Diagnosis not present

## 2022-01-10 DIAGNOSIS — Z20822 Contact with and (suspected) exposure to covid-19: Secondary | ICD-10-CM | POA: Diagnosis not present

## 2022-01-10 DIAGNOSIS — I953 Hypotension of hemodialysis: Secondary | ICD-10-CM | POA: Diagnosis not present

## 2022-01-10 DIAGNOSIS — Z8673 Personal history of transient ischemic attack (TIA), and cerebral infarction without residual deficits: Secondary | ICD-10-CM | POA: Diagnosis not present

## 2022-01-10 DIAGNOSIS — Z79899 Other long term (current) drug therapy: Secondary | ICD-10-CM | POA: Diagnosis not present

## 2022-01-10 DIAGNOSIS — D631 Anemia in chronic kidney disease: Secondary | ICD-10-CM | POA: Diagnosis not present

## 2022-01-10 DIAGNOSIS — I272 Pulmonary hypertension, unspecified: Secondary | ICD-10-CM | POA: Diagnosis not present

## 2022-01-11 DIAGNOSIS — Z792 Long term (current) use of antibiotics: Secondary | ICD-10-CM | POA: Diagnosis not present

## 2022-01-11 DIAGNOSIS — Z7682 Awaiting organ transplant status: Secondary | ICD-10-CM | POA: Diagnosis not present

## 2022-01-11 DIAGNOSIS — T8619 Other complication of kidney transplant: Secondary | ICD-10-CM | POA: Diagnosis not present

## 2022-01-11 DIAGNOSIS — N186 End stage renal disease: Secondary | ICD-10-CM | POA: Diagnosis not present

## 2022-01-11 DIAGNOSIS — Z940 Kidney transplant status: Secondary | ICD-10-CM | POA: Diagnosis not present

## 2022-01-11 DIAGNOSIS — M3214 Glomerular disease in systemic lupus erythematosus: Secondary | ICD-10-CM | POA: Diagnosis not present

## 2022-01-11 DIAGNOSIS — D849 Immunodeficiency, unspecified: Secondary | ICD-10-CM | POA: Diagnosis not present

## 2022-01-11 DIAGNOSIS — R739 Hyperglycemia, unspecified: Secondary | ICD-10-CM | POA: Diagnosis not present

## 2022-01-11 DIAGNOSIS — Z992 Dependence on renal dialysis: Secondary | ICD-10-CM | POA: Diagnosis not present

## 2022-01-11 DIAGNOSIS — Z960 Presence of urogenital implants: Secondary | ICD-10-CM | POA: Diagnosis not present

## 2022-01-11 DIAGNOSIS — G8918 Other acute postprocedural pain: Secondary | ICD-10-CM | POA: Diagnosis not present

## 2022-01-11 DIAGNOSIS — I272 Pulmonary hypertension, unspecified: Secondary | ICD-10-CM | POA: Diagnosis not present

## 2022-01-12 DIAGNOSIS — Z940 Kidney transplant status: Secondary | ICD-10-CM | POA: Diagnosis not present

## 2022-01-12 DIAGNOSIS — G8918 Other acute postprocedural pain: Secondary | ICD-10-CM | POA: Diagnosis not present

## 2022-01-12 DIAGNOSIS — Z992 Dependence on renal dialysis: Secondary | ICD-10-CM | POA: Diagnosis not present

## 2022-01-12 DIAGNOSIS — T8619 Other complication of kidney transplant: Secondary | ICD-10-CM | POA: Diagnosis not present

## 2022-01-12 DIAGNOSIS — Z792 Long term (current) use of antibiotics: Secondary | ICD-10-CM | POA: Diagnosis not present

## 2022-01-12 DIAGNOSIS — Z7682 Awaiting organ transplant status: Secondary | ICD-10-CM | POA: Diagnosis not present

## 2022-01-12 DIAGNOSIS — Z960 Presence of urogenital implants: Secondary | ICD-10-CM | POA: Diagnosis not present

## 2022-01-12 DIAGNOSIS — N186 End stage renal disease: Secondary | ICD-10-CM | POA: Diagnosis not present

## 2022-01-12 DIAGNOSIS — D849 Immunodeficiency, unspecified: Secondary | ICD-10-CM | POA: Diagnosis not present

## 2022-01-12 DIAGNOSIS — M3214 Glomerular disease in systemic lupus erythematosus: Secondary | ICD-10-CM | POA: Diagnosis not present

## 2022-01-13 DIAGNOSIS — D849 Immunodeficiency, unspecified: Secondary | ICD-10-CM | POA: Diagnosis not present

## 2022-01-13 DIAGNOSIS — R911 Solitary pulmonary nodule: Secondary | ICD-10-CM | POA: Diagnosis not present

## 2022-01-13 DIAGNOSIS — N6310 Unspecified lump in the right breast, unspecified quadrant: Secondary | ICD-10-CM | POA: Diagnosis not present

## 2022-01-13 DIAGNOSIS — I272 Pulmonary hypertension, unspecified: Secondary | ICD-10-CM | POA: Diagnosis not present

## 2022-01-13 DIAGNOSIS — Z992 Dependence on renal dialysis: Secondary | ICD-10-CM | POA: Diagnosis not present

## 2022-01-13 DIAGNOSIS — Z940 Kidney transplant status: Secondary | ICD-10-CM | POA: Diagnosis not present

## 2022-01-13 DIAGNOSIS — N186 End stage renal disease: Secondary | ICD-10-CM | POA: Diagnosis not present

## 2022-01-13 DIAGNOSIS — Z7682 Awaiting organ transplant status: Secondary | ICD-10-CM | POA: Diagnosis not present

## 2022-01-13 DIAGNOSIS — Z792 Long term (current) use of antibiotics: Secondary | ICD-10-CM | POA: Diagnosis not present

## 2022-01-13 DIAGNOSIS — G8918 Other acute postprocedural pain: Secondary | ICD-10-CM | POA: Diagnosis not present

## 2022-01-14 DIAGNOSIS — M3219 Other organ or system involvement in systemic lupus erythematosus: Secondary | ICD-10-CM | POA: Diagnosis not present

## 2022-01-14 DIAGNOSIS — Z992 Dependence on renal dialysis: Secondary | ICD-10-CM | POA: Diagnosis not present

## 2022-01-14 DIAGNOSIS — N186 End stage renal disease: Secondary | ICD-10-CM | POA: Diagnosis not present

## 2022-01-14 DIAGNOSIS — Z940 Kidney transplant status: Secondary | ICD-10-CM | POA: Diagnosis not present

## 2022-01-14 DIAGNOSIS — I509 Heart failure, unspecified: Secondary | ICD-10-CM | POA: Diagnosis not present

## 2022-01-14 DIAGNOSIS — I272 Pulmonary hypertension, unspecified: Secondary | ICD-10-CM | POA: Diagnosis not present

## 2022-01-16 DIAGNOSIS — I272 Pulmonary hypertension, unspecified: Secondary | ICD-10-CM | POA: Diagnosis not present

## 2022-01-16 DIAGNOSIS — M3219 Other organ or system involvement in systemic lupus erythematosus: Secondary | ICD-10-CM | POA: Diagnosis not present

## 2022-01-16 DIAGNOSIS — I509 Heart failure, unspecified: Secondary | ICD-10-CM | POA: Diagnosis not present

## 2022-01-20 DIAGNOSIS — I272 Pulmonary hypertension, unspecified: Secondary | ICD-10-CM | POA: Diagnosis not present

## 2022-01-20 DIAGNOSIS — M3214 Glomerular disease in systemic lupus erythematosus: Secondary | ICD-10-CM | POA: Diagnosis not present

## 2022-01-20 DIAGNOSIS — D849 Immunodeficiency, unspecified: Secondary | ICD-10-CM | POA: Diagnosis not present

## 2022-01-20 DIAGNOSIS — Z8673 Personal history of transient ischemic attack (TIA), and cerebral infarction without residual deficits: Secondary | ICD-10-CM | POA: Diagnosis not present

## 2022-01-20 DIAGNOSIS — Z940 Kidney transplant status: Secondary | ICD-10-CM | POA: Diagnosis not present

## 2022-01-20 DIAGNOSIS — Z7952 Long term (current) use of systemic steroids: Secondary | ICD-10-CM | POA: Diagnosis not present

## 2022-01-20 DIAGNOSIS — G8918 Other acute postprocedural pain: Secondary | ICD-10-CM | POA: Diagnosis not present

## 2022-01-20 DIAGNOSIS — Z79899 Other long term (current) drug therapy: Secondary | ICD-10-CM | POA: Diagnosis not present

## 2022-01-20 DIAGNOSIS — Z960 Presence of urogenital implants: Secondary | ICD-10-CM | POA: Diagnosis not present

## 2022-01-20 DIAGNOSIS — Z792 Long term (current) use of antibiotics: Secondary | ICD-10-CM | POA: Diagnosis not present

## 2022-01-20 DIAGNOSIS — D84821 Immunodeficiency due to drugs: Secondary | ICD-10-CM | POA: Diagnosis not present

## 2022-01-20 DIAGNOSIS — Z79621 Long term (current) use of calcineurin inhibitor: Secondary | ICD-10-CM | POA: Diagnosis not present

## 2022-01-20 DIAGNOSIS — Z4822 Encounter for aftercare following kidney transplant: Secondary | ICD-10-CM | POA: Diagnosis not present

## 2022-01-20 DIAGNOSIS — Z20822 Contact with and (suspected) exposure to covid-19: Secondary | ICD-10-CM | POA: Diagnosis not present

## 2022-01-24 DIAGNOSIS — Z4822 Encounter for aftercare following kidney transplant: Secondary | ICD-10-CM | POA: Diagnosis not present

## 2022-01-24 DIAGNOSIS — Z940 Kidney transplant status: Secondary | ICD-10-CM | POA: Diagnosis not present

## 2022-01-24 DIAGNOSIS — D849 Immunodeficiency, unspecified: Secondary | ICD-10-CM | POA: Diagnosis not present

## 2022-01-24 DIAGNOSIS — M3214 Glomerular disease in systemic lupus erythematosus: Secondary | ICD-10-CM | POA: Diagnosis not present

## 2022-01-26 DIAGNOSIS — Z79899 Other long term (current) drug therapy: Secondary | ICD-10-CM | POA: Diagnosis not present

## 2022-01-26 DIAGNOSIS — T8619 Other complication of kidney transplant: Secondary | ICD-10-CM | POA: Diagnosis not present

## 2022-01-26 DIAGNOSIS — Z940 Kidney transplant status: Secondary | ICD-10-CM | POA: Diagnosis not present

## 2022-01-26 DIAGNOSIS — D849 Immunodeficiency, unspecified: Secondary | ICD-10-CM | POA: Diagnosis not present

## 2022-01-26 DIAGNOSIS — Z4822 Encounter for aftercare following kidney transplant: Secondary | ICD-10-CM | POA: Diagnosis not present

## 2022-02-02 DIAGNOSIS — Z960 Presence of urogenital implants: Secondary | ICD-10-CM | POA: Diagnosis not present

## 2022-02-02 DIAGNOSIS — Z4822 Encounter for aftercare following kidney transplant: Secondary | ICD-10-CM | POA: Diagnosis not present

## 2022-02-02 DIAGNOSIS — Z940 Kidney transplant status: Secondary | ICD-10-CM | POA: Diagnosis not present

## 2022-02-02 DIAGNOSIS — D849 Immunodeficiency, unspecified: Secondary | ICD-10-CM | POA: Diagnosis not present

## 2022-02-02 DIAGNOSIS — Z792 Long term (current) use of antibiotics: Secondary | ICD-10-CM | POA: Diagnosis not present

## 2022-02-02 DIAGNOSIS — Z86018 Personal history of other benign neoplasm: Secondary | ICD-10-CM | POA: Diagnosis not present

## 2022-02-02 DIAGNOSIS — M3214 Glomerular disease in systemic lupus erythematosus: Secondary | ICD-10-CM | POA: Diagnosis not present

## 2022-02-02 DIAGNOSIS — D84821 Immunodeficiency due to drugs: Secondary | ICD-10-CM | POA: Diagnosis not present

## 2022-02-02 DIAGNOSIS — G8918 Other acute postprocedural pain: Secondary | ICD-10-CM | POA: Diagnosis not present

## 2022-02-02 DIAGNOSIS — I272 Pulmonary hypertension, unspecified: Secondary | ICD-10-CM | POA: Diagnosis not present

## 2022-02-02 DIAGNOSIS — M329 Systemic lupus erythematosus, unspecified: Secondary | ICD-10-CM | POA: Diagnosis not present

## 2022-02-02 DIAGNOSIS — Z79899 Other long term (current) drug therapy: Secondary | ICD-10-CM | POA: Diagnosis not present

## 2022-02-02 DIAGNOSIS — T8619 Other complication of kidney transplant: Secondary | ICD-10-CM | POA: Diagnosis not present

## 2022-02-02 DIAGNOSIS — Z7952 Long term (current) use of systemic steroids: Secondary | ICD-10-CM | POA: Diagnosis not present

## 2022-02-09 DIAGNOSIS — Z79621 Long term (current) use of calcineurin inhibitor: Secondary | ICD-10-CM | POA: Diagnosis not present

## 2022-02-09 DIAGNOSIS — D849 Immunodeficiency, unspecified: Secondary | ICD-10-CM | POA: Diagnosis not present

## 2022-02-09 DIAGNOSIS — D84821 Immunodeficiency due to drugs: Secondary | ICD-10-CM | POA: Diagnosis not present

## 2022-02-09 DIAGNOSIS — Z4822 Encounter for aftercare following kidney transplant: Secondary | ICD-10-CM | POA: Diagnosis not present

## 2022-02-09 DIAGNOSIS — G8918 Other acute postprocedural pain: Secondary | ICD-10-CM | POA: Diagnosis not present

## 2022-02-09 DIAGNOSIS — M329 Systemic lupus erythematosus, unspecified: Secondary | ICD-10-CM | POA: Diagnosis not present

## 2022-02-09 DIAGNOSIS — T8619 Other complication of kidney transplant: Secondary | ICD-10-CM | POA: Diagnosis not present

## 2022-02-09 DIAGNOSIS — Z7952 Long term (current) use of systemic steroids: Secondary | ICD-10-CM | POA: Diagnosis not present

## 2022-02-09 DIAGNOSIS — N6310 Unspecified lump in the right breast, unspecified quadrant: Secondary | ICD-10-CM | POA: Diagnosis not present

## 2022-02-09 DIAGNOSIS — I272 Pulmonary hypertension, unspecified: Secondary | ICD-10-CM | POA: Diagnosis not present

## 2022-02-09 DIAGNOSIS — Z8673 Personal history of transient ischemic attack (TIA), and cerebral infarction without residual deficits: Secondary | ICD-10-CM | POA: Diagnosis not present

## 2022-02-09 DIAGNOSIS — Z79899 Other long term (current) drug therapy: Secondary | ICD-10-CM | POA: Diagnosis not present

## 2022-02-16 DIAGNOSIS — M3219 Other organ or system involvement in systemic lupus erythematosus: Secondary | ICD-10-CM | POA: Diagnosis not present

## 2022-02-16 DIAGNOSIS — I509 Heart failure, unspecified: Secondary | ICD-10-CM | POA: Diagnosis not present

## 2022-02-16 DIAGNOSIS — I272 Pulmonary hypertension, unspecified: Secondary | ICD-10-CM | POA: Diagnosis not present

## 2022-02-16 DIAGNOSIS — Z1231 Encounter for screening mammogram for malignant neoplasm of breast: Secondary | ICD-10-CM | POA: Diagnosis not present

## 2022-02-17 DIAGNOSIS — D849 Immunodeficiency, unspecified: Secondary | ICD-10-CM | POA: Diagnosis not present

## 2022-02-17 DIAGNOSIS — Z940 Kidney transplant status: Secondary | ICD-10-CM | POA: Diagnosis not present

## 2022-02-23 DIAGNOSIS — M3214 Glomerular disease in systemic lupus erythematosus: Secondary | ICD-10-CM | POA: Diagnosis not present

## 2022-02-23 DIAGNOSIS — Z1159 Encounter for screening for other viral diseases: Secondary | ICD-10-CM | POA: Diagnosis not present

## 2022-02-23 DIAGNOSIS — Z4822 Encounter for aftercare following kidney transplant: Secondary | ICD-10-CM | POA: Diagnosis not present

## 2022-02-23 DIAGNOSIS — M329 Systemic lupus erythematosus, unspecified: Secondary | ICD-10-CM | POA: Diagnosis not present

## 2022-02-23 DIAGNOSIS — Z79621 Long term (current) use of calcineurin inhibitor: Secondary | ICD-10-CM | POA: Diagnosis not present

## 2022-02-23 DIAGNOSIS — Z792 Long term (current) use of antibiotics: Secondary | ICD-10-CM | POA: Diagnosis not present

## 2022-02-23 DIAGNOSIS — D849 Immunodeficiency, unspecified: Secondary | ICD-10-CM | POA: Diagnosis not present

## 2022-02-23 DIAGNOSIS — D84821 Immunodeficiency due to drugs: Secondary | ICD-10-CM | POA: Diagnosis not present

## 2022-02-23 DIAGNOSIS — Z79899 Other long term (current) drug therapy: Secondary | ICD-10-CM | POA: Diagnosis not present

## 2022-02-23 DIAGNOSIS — T8619 Other complication of kidney transplant: Secondary | ICD-10-CM | POA: Diagnosis not present

## 2022-02-23 DIAGNOSIS — Z8673 Personal history of transient ischemic attack (TIA), and cerebral infarction without residual deficits: Secondary | ICD-10-CM | POA: Diagnosis not present

## 2022-03-02 DIAGNOSIS — R232 Flushing: Secondary | ICD-10-CM | POA: Diagnosis not present

## 2022-03-02 DIAGNOSIS — Z940 Kidney transplant status: Secondary | ICD-10-CM | POA: Diagnosis not present

## 2022-03-02 DIAGNOSIS — N186 End stage renal disease: Secondary | ICD-10-CM | POA: Diagnosis not present

## 2022-03-03 DIAGNOSIS — Z940 Kidney transplant status: Secondary | ICD-10-CM | POA: Diagnosis not present

## 2022-03-16 DIAGNOSIS — Z1159 Encounter for screening for other viral diseases: Secondary | ICD-10-CM | POA: Diagnosis not present

## 2022-03-16 DIAGNOSIS — M328 Other forms of systemic lupus erythematosus: Secondary | ICD-10-CM | POA: Diagnosis not present

## 2022-03-16 DIAGNOSIS — D849 Immunodeficiency, unspecified: Secondary | ICD-10-CM | POA: Diagnosis not present

## 2022-03-16 DIAGNOSIS — D84821 Immunodeficiency due to drugs: Secondary | ICD-10-CM | POA: Diagnosis not present

## 2022-03-16 DIAGNOSIS — Z4822 Encounter for aftercare following kidney transplant: Secondary | ICD-10-CM | POA: Diagnosis not present

## 2022-03-16 DIAGNOSIS — Z7969 Long term (current) use of other immunomodulators and immunosuppressants: Secondary | ICD-10-CM | POA: Diagnosis not present

## 2022-03-16 DIAGNOSIS — Z79621 Long term (current) use of calcineurin inhibitor: Secondary | ICD-10-CM | POA: Diagnosis not present

## 2022-03-16 DIAGNOSIS — I272 Pulmonary hypertension, unspecified: Secondary | ICD-10-CM | POA: Diagnosis not present

## 2022-03-16 DIAGNOSIS — T8619 Other complication of kidney transplant: Secondary | ICD-10-CM | POA: Diagnosis not present

## 2022-03-16 DIAGNOSIS — Z940 Kidney transplant status: Secondary | ICD-10-CM | POA: Diagnosis not present

## 2022-03-18 DIAGNOSIS — M3219 Other organ or system involvement in systemic lupus erythematosus: Secondary | ICD-10-CM | POA: Diagnosis not present

## 2022-03-18 DIAGNOSIS — I272 Pulmonary hypertension, unspecified: Secondary | ICD-10-CM | POA: Diagnosis not present

## 2022-03-18 DIAGNOSIS — I509 Heart failure, unspecified: Secondary | ICD-10-CM | POA: Diagnosis not present

## 2022-03-20 ENCOUNTER — Ambulatory Visit: Payer: Medicare Other

## 2022-03-22 DIAGNOSIS — Z5181 Encounter for therapeutic drug level monitoring: Secondary | ICD-10-CM | POA: Diagnosis not present

## 2022-03-22 DIAGNOSIS — Z940 Kidney transplant status: Secondary | ICD-10-CM | POA: Diagnosis not present

## 2022-03-27 DIAGNOSIS — Z5181 Encounter for therapeutic drug level monitoring: Secondary | ICD-10-CM | POA: Diagnosis not present

## 2022-03-27 DIAGNOSIS — Z940 Kidney transplant status: Secondary | ICD-10-CM | POA: Diagnosis not present

## 2022-03-28 DIAGNOSIS — M3214 Glomerular disease in systemic lupus erythematosus: Secondary | ICD-10-CM | POA: Diagnosis not present

## 2022-03-28 DIAGNOSIS — M25561 Pain in right knee: Secondary | ICD-10-CM | POA: Diagnosis not present

## 2022-03-28 DIAGNOSIS — Z79899 Other long term (current) drug therapy: Secondary | ICD-10-CM | POA: Diagnosis not present

## 2022-03-28 DIAGNOSIS — M3219 Other organ or system involvement in systemic lupus erythematosus: Secondary | ICD-10-CM | POA: Diagnosis not present

## 2022-04-04 DIAGNOSIS — D849 Immunodeficiency, unspecified: Secondary | ICD-10-CM | POA: Diagnosis not present

## 2022-04-04 DIAGNOSIS — Z940 Kidney transplant status: Secondary | ICD-10-CM | POA: Diagnosis not present

## 2022-04-04 DIAGNOSIS — B348 Other viral infections of unspecified site: Secondary | ICD-10-CM | POA: Diagnosis not present

## 2022-04-05 DIAGNOSIS — Z940 Kidney transplant status: Secondary | ICD-10-CM | POA: Diagnosis not present

## 2022-04-05 DIAGNOSIS — Z5181 Encounter for therapeutic drug level monitoring: Secondary | ICD-10-CM | POA: Diagnosis not present

## 2022-04-12 DIAGNOSIS — Z1159 Encounter for screening for other viral diseases: Secondary | ICD-10-CM | POA: Diagnosis not present

## 2022-04-12 DIAGNOSIS — Z940 Kidney transplant status: Secondary | ICD-10-CM | POA: Diagnosis not present

## 2022-04-12 DIAGNOSIS — D849 Immunodeficiency, unspecified: Secondary | ICD-10-CM | POA: Diagnosis not present

## 2022-04-12 DIAGNOSIS — Z5181 Encounter for therapeutic drug level monitoring: Secondary | ICD-10-CM | POA: Diagnosis not present

## 2022-04-12 DIAGNOSIS — T8619 Other complication of kidney transplant: Secondary | ICD-10-CM | POA: Diagnosis not present

## 2022-04-13 DIAGNOSIS — T8619 Other complication of kidney transplant: Secondary | ICD-10-CM | POA: Diagnosis not present

## 2022-04-13 DIAGNOSIS — I272 Pulmonary hypertension, unspecified: Secondary | ICD-10-CM | POA: Diagnosis not present

## 2022-04-13 DIAGNOSIS — Z8673 Personal history of transient ischemic attack (TIA), and cerebral infarction without residual deficits: Secondary | ICD-10-CM | POA: Diagnosis not present

## 2022-04-13 DIAGNOSIS — I10 Essential (primary) hypertension: Secondary | ICD-10-CM | POA: Diagnosis not present

## 2022-04-13 DIAGNOSIS — Z940 Kidney transplant status: Secondary | ICD-10-CM | POA: Diagnosis not present

## 2022-04-13 DIAGNOSIS — Z79624 Long term (current) use of inhibitors of nucleotide synthesis: Secondary | ICD-10-CM | POA: Diagnosis not present

## 2022-04-13 DIAGNOSIS — D849 Immunodeficiency, unspecified: Secondary | ICD-10-CM | POA: Diagnosis not present

## 2022-04-13 DIAGNOSIS — Z4822 Encounter for aftercare following kidney transplant: Secondary | ICD-10-CM | POA: Diagnosis not present

## 2022-04-13 DIAGNOSIS — Z79899 Other long term (current) drug therapy: Secondary | ICD-10-CM | POA: Diagnosis not present

## 2022-04-13 DIAGNOSIS — Z87448 Personal history of other diseases of urinary system: Secondary | ICD-10-CM | POA: Diagnosis not present

## 2022-04-13 DIAGNOSIS — Z8719 Personal history of other diseases of the digestive system: Secondary | ICD-10-CM | POA: Diagnosis not present

## 2022-04-13 DIAGNOSIS — Z7952 Long term (current) use of systemic steroids: Secondary | ICD-10-CM | POA: Diagnosis not present

## 2022-04-13 DIAGNOSIS — Z7969 Long term (current) use of other immunomodulators and immunosuppressants: Secondary | ICD-10-CM | POA: Diagnosis not present

## 2022-04-13 DIAGNOSIS — Z79621 Long term (current) use of calcineurin inhibitor: Secondary | ICD-10-CM | POA: Diagnosis not present

## 2022-04-13 DIAGNOSIS — M328 Other forms of systemic lupus erythematosus: Secondary | ICD-10-CM | POA: Diagnosis not present

## 2022-04-13 DIAGNOSIS — D84821 Immunodeficiency due to drugs: Secondary | ICD-10-CM | POA: Diagnosis not present

## 2022-04-13 DIAGNOSIS — Z792 Long term (current) use of antibiotics: Secondary | ICD-10-CM | POA: Diagnosis not present

## 2022-04-18 DIAGNOSIS — I272 Pulmonary hypertension, unspecified: Secondary | ICD-10-CM | POA: Diagnosis not present

## 2022-04-18 DIAGNOSIS — M3219 Other organ or system involvement in systemic lupus erythematosus: Secondary | ICD-10-CM | POA: Diagnosis not present

## 2022-04-18 DIAGNOSIS — I509 Heart failure, unspecified: Secondary | ICD-10-CM | POA: Diagnosis not present

## 2022-04-26 DIAGNOSIS — Z5181 Encounter for therapeutic drug level monitoring: Secondary | ICD-10-CM | POA: Diagnosis not present

## 2022-04-26 DIAGNOSIS — Z940 Kidney transplant status: Secondary | ICD-10-CM | POA: Diagnosis not present

## 2022-05-18 DIAGNOSIS — M3219 Other organ or system involvement in systemic lupus erythematosus: Secondary | ICD-10-CM | POA: Diagnosis not present

## 2022-05-18 DIAGNOSIS — I509 Heart failure, unspecified: Secondary | ICD-10-CM | POA: Diagnosis not present

## 2022-05-18 DIAGNOSIS — I272 Pulmonary hypertension, unspecified: Secondary | ICD-10-CM | POA: Diagnosis not present

## 2022-05-22 DIAGNOSIS — M25561 Pain in right knee: Secondary | ICD-10-CM | POA: Diagnosis not present

## 2022-05-22 DIAGNOSIS — M2241 Chondromalacia patellae, right knee: Secondary | ICD-10-CM | POA: Diagnosis not present

## 2022-05-22 DIAGNOSIS — S83411A Sprain of medial collateral ligament of right knee, initial encounter: Secondary | ICD-10-CM | POA: Diagnosis not present

## 2022-05-22 DIAGNOSIS — X509XXA Other and unspecified overexertion or strenuous movements or postures, initial encounter: Secondary | ICD-10-CM | POA: Diagnosis not present

## 2022-05-26 DIAGNOSIS — I272 Pulmonary hypertension, unspecified: Secondary | ICD-10-CM | POA: Diagnosis not present

## 2022-05-26 DIAGNOSIS — Z4822 Encounter for aftercare following kidney transplant: Secondary | ICD-10-CM | POA: Diagnosis not present

## 2022-05-26 DIAGNOSIS — M1990 Unspecified osteoarthritis, unspecified site: Secondary | ICD-10-CM | POA: Diagnosis not present

## 2022-05-26 DIAGNOSIS — D849 Immunodeficiency, unspecified: Secondary | ICD-10-CM | POA: Diagnosis not present

## 2022-05-26 DIAGNOSIS — D84821 Immunodeficiency due to drugs: Secondary | ICD-10-CM | POA: Diagnosis not present

## 2022-05-26 DIAGNOSIS — Z79899 Other long term (current) drug therapy: Secondary | ICD-10-CM | POA: Diagnosis not present

## 2022-05-26 DIAGNOSIS — Z940 Kidney transplant status: Secondary | ICD-10-CM | POA: Diagnosis not present

## 2022-05-26 DIAGNOSIS — D709 Neutropenia, unspecified: Secondary | ICD-10-CM | POA: Diagnosis not present

## 2022-05-26 DIAGNOSIS — I10 Essential (primary) hypertension: Secondary | ICD-10-CM | POA: Diagnosis not present

## 2022-05-30 DIAGNOSIS — D702 Other drug-induced agranulocytosis: Secondary | ICD-10-CM | POA: Diagnosis not present

## 2022-05-30 DIAGNOSIS — D709 Neutropenia, unspecified: Secondary | ICD-10-CM | POA: Diagnosis not present

## 2022-05-31 DIAGNOSIS — T82848A Pain from vascular prosthetic devices, implants and grafts, initial encounter: Secondary | ICD-10-CM | POA: Diagnosis not present

## 2022-06-01 DIAGNOSIS — D849 Immunodeficiency, unspecified: Secondary | ICD-10-CM | POA: Diagnosis not present

## 2022-06-01 DIAGNOSIS — M79602 Pain in left arm: Secondary | ICD-10-CM | POA: Diagnosis not present

## 2022-06-01 DIAGNOSIS — Z940 Kidney transplant status: Secondary | ICD-10-CM | POA: Diagnosis not present

## 2022-06-01 DIAGNOSIS — T82898A Other specified complication of vascular prosthetic devices, implants and grafts, initial encounter: Secondary | ICD-10-CM | POA: Diagnosis not present

## 2022-06-01 DIAGNOSIS — Z5181 Encounter for therapeutic drug level monitoring: Secondary | ICD-10-CM | POA: Diagnosis not present

## 2022-06-01 DIAGNOSIS — B348 Other viral infections of unspecified site: Secondary | ICD-10-CM | POA: Diagnosis not present

## 2022-06-05 DIAGNOSIS — Z5181 Encounter for therapeutic drug level monitoring: Secondary | ICD-10-CM | POA: Diagnosis not present

## 2022-06-05 DIAGNOSIS — Z940 Kidney transplant status: Secondary | ICD-10-CM | POA: Diagnosis not present

## 2022-06-05 DIAGNOSIS — Z01812 Encounter for preprocedural laboratory examination: Secondary | ICD-10-CM | POA: Diagnosis not present

## 2022-06-06 DIAGNOSIS — T8619 Other complication of kidney transplant: Secondary | ICD-10-CM | POA: Diagnosis not present

## 2022-06-06 DIAGNOSIS — N261 Atrophy of kidney (terminal): Secondary | ICD-10-CM | POA: Diagnosis not present

## 2022-06-06 DIAGNOSIS — B348 Other viral infections of unspecified site: Secondary | ICD-10-CM | POA: Diagnosis not present

## 2022-06-06 DIAGNOSIS — Z940 Kidney transplant status: Secondary | ICD-10-CM | POA: Diagnosis not present

## 2022-06-06 DIAGNOSIS — N12 Tubulo-interstitial nephritis, not specified as acute or chronic: Secondary | ICD-10-CM | POA: Diagnosis not present

## 2022-06-06 DIAGNOSIS — Z4822 Encounter for aftercare following kidney transplant: Secondary | ICD-10-CM | POA: Diagnosis not present

## 2022-06-06 DIAGNOSIS — R7989 Other specified abnormal findings of blood chemistry: Secondary | ICD-10-CM | POA: Diagnosis not present

## 2022-06-12 DIAGNOSIS — Z940 Kidney transplant status: Secondary | ICD-10-CM | POA: Diagnosis not present

## 2022-06-12 DIAGNOSIS — Z5181 Encounter for therapeutic drug level monitoring: Secondary | ICD-10-CM | POA: Diagnosis not present

## 2022-06-17 DIAGNOSIS — R509 Fever, unspecified: Secondary | ICD-10-CM | POA: Diagnosis not present

## 2022-06-17 DIAGNOSIS — R0981 Nasal congestion: Secondary | ICD-10-CM | POA: Diagnosis not present

## 2022-06-17 DIAGNOSIS — U071 COVID-19: Secondary | ICD-10-CM | POA: Diagnosis not present

## 2022-06-23 DIAGNOSIS — Z5181 Encounter for therapeutic drug level monitoring: Secondary | ICD-10-CM | POA: Diagnosis not present

## 2022-06-23 DIAGNOSIS — Z940 Kidney transplant status: Secondary | ICD-10-CM | POA: Diagnosis not present

## 2022-06-28 DIAGNOSIS — U071 COVID-19: Secondary | ICD-10-CM | POA: Diagnosis not present

## 2022-06-28 DIAGNOSIS — B338 Other specified viral diseases: Secondary | ICD-10-CM | POA: Diagnosis not present

## 2022-06-28 DIAGNOSIS — N058 Unspecified nephritic syndrome with other morphologic changes: Secondary | ICD-10-CM | POA: Diagnosis not present

## 2022-06-28 DIAGNOSIS — Z79899 Other long term (current) drug therapy: Secondary | ICD-10-CM | POA: Diagnosis not present

## 2022-06-28 DIAGNOSIS — M3214 Glomerular disease in systemic lupus erythematosus: Secondary | ICD-10-CM | POA: Diagnosis not present

## 2022-06-28 DIAGNOSIS — T8619 Other complication of kidney transplant: Secondary | ICD-10-CM | POA: Diagnosis not present

## 2022-06-28 DIAGNOSIS — D849 Immunodeficiency, unspecified: Secondary | ICD-10-CM | POA: Diagnosis not present

## 2022-06-28 DIAGNOSIS — N6310 Unspecified lump in the right breast, unspecified quadrant: Secondary | ICD-10-CM | POA: Diagnosis not present

## 2022-06-28 DIAGNOSIS — Z940 Kidney transplant status: Secondary | ICD-10-CM | POA: Diagnosis not present

## 2022-07-03 DIAGNOSIS — T8611 Kidney transplant rejection: Secondary | ICD-10-CM | POA: Diagnosis not present

## 2022-07-03 DIAGNOSIS — R918 Other nonspecific abnormal finding of lung field: Secondary | ICD-10-CM | POA: Diagnosis not present

## 2022-07-03 DIAGNOSIS — D8481 Immunodeficiency due to conditions classified elsewhere: Secondary | ICD-10-CM | POA: Diagnosis not present

## 2022-07-03 DIAGNOSIS — R0600 Dyspnea, unspecified: Secondary | ICD-10-CM | POA: Diagnosis not present

## 2022-07-03 DIAGNOSIS — N179 Acute kidney failure, unspecified: Secondary | ICD-10-CM | POA: Diagnosis not present

## 2022-07-03 DIAGNOSIS — Z8673 Personal history of transient ischemic attack (TIA), and cerebral infarction without residual deficits: Secondary | ICD-10-CM | POA: Diagnosis not present

## 2022-07-03 DIAGNOSIS — N058 Unspecified nephritic syndrome with other morphologic changes: Secondary | ICD-10-CM | POA: Diagnosis not present

## 2022-07-03 DIAGNOSIS — Z86010 Personal history of colonic polyps: Secondary | ICD-10-CM | POA: Diagnosis not present

## 2022-07-03 DIAGNOSIS — M329 Systemic lupus erythematosus, unspecified: Secondary | ICD-10-CM | POA: Diagnosis not present

## 2022-07-03 DIAGNOSIS — Z79899 Other long term (current) drug therapy: Secondary | ICD-10-CM | POA: Diagnosis not present

## 2022-07-03 DIAGNOSIS — D709 Neutropenia, unspecified: Secondary | ICD-10-CM | POA: Diagnosis not present

## 2022-07-03 DIAGNOSIS — B348 Other viral infections of unspecified site: Secondary | ICD-10-CM | POA: Diagnosis not present

## 2022-07-03 DIAGNOSIS — D849 Immunodeficiency, unspecified: Secondary | ICD-10-CM | POA: Diagnosis not present

## 2022-07-03 DIAGNOSIS — B338 Other specified viral diseases: Secondary | ICD-10-CM | POA: Diagnosis not present

## 2022-07-03 DIAGNOSIS — Z940 Kidney transplant status: Secondary | ICD-10-CM | POA: Diagnosis not present

## 2022-07-03 DIAGNOSIS — B9789 Other viral agents as the cause of diseases classified elsewhere: Secondary | ICD-10-CM | POA: Diagnosis not present

## 2022-07-03 DIAGNOSIS — T8619 Other complication of kidney transplant: Secondary | ICD-10-CM | POA: Diagnosis not present

## 2022-07-03 DIAGNOSIS — Z888 Allergy status to other drugs, medicaments and biological substances status: Secondary | ICD-10-CM | POA: Diagnosis not present

## 2022-07-03 DIAGNOSIS — D702 Other drug-induced agranulocytosis: Secondary | ICD-10-CM | POA: Diagnosis not present

## 2022-07-03 DIAGNOSIS — I272 Pulmonary hypertension, unspecified: Secondary | ICD-10-CM | POA: Diagnosis not present

## 2022-07-03 DIAGNOSIS — N2889 Other specified disorders of kidney and ureter: Secondary | ICD-10-CM | POA: Diagnosis not present

## 2022-07-04 DIAGNOSIS — N179 Acute kidney failure, unspecified: Secondary | ICD-10-CM | POA: Diagnosis not present

## 2022-07-04 DIAGNOSIS — D849 Immunodeficiency, unspecified: Secondary | ICD-10-CM | POA: Diagnosis not present

## 2022-07-04 DIAGNOSIS — N058 Unspecified nephritic syndrome with other morphologic changes: Secondary | ICD-10-CM | POA: Diagnosis not present

## 2022-07-04 DIAGNOSIS — Z940 Kidney transplant status: Secondary | ICD-10-CM | POA: Diagnosis not present

## 2022-07-04 DIAGNOSIS — T8611 Kidney transplant rejection: Secondary | ICD-10-CM | POA: Diagnosis not present

## 2022-07-04 DIAGNOSIS — B348 Other viral infections of unspecified site: Secondary | ICD-10-CM | POA: Diagnosis not present

## 2022-07-04 DIAGNOSIS — B338 Other specified viral diseases: Secondary | ICD-10-CM | POA: Diagnosis not present

## 2022-07-05 DIAGNOSIS — R0600 Dyspnea, unspecified: Secondary | ICD-10-CM | POA: Diagnosis not present

## 2022-07-05 DIAGNOSIS — D849 Immunodeficiency, unspecified: Secondary | ICD-10-CM | POA: Diagnosis not present

## 2022-07-05 DIAGNOSIS — N179 Acute kidney failure, unspecified: Secondary | ICD-10-CM | POA: Diagnosis not present

## 2022-07-05 DIAGNOSIS — I272 Pulmonary hypertension, unspecified: Secondary | ICD-10-CM | POA: Diagnosis not present

## 2022-07-05 DIAGNOSIS — R918 Other nonspecific abnormal finding of lung field: Secondary | ICD-10-CM | POA: Diagnosis not present

## 2022-07-05 DIAGNOSIS — Z940 Kidney transplant status: Secondary | ICD-10-CM | POA: Diagnosis not present

## 2022-07-06 DIAGNOSIS — R0600 Dyspnea, unspecified: Secondary | ICD-10-CM | POA: Diagnosis not present

## 2022-07-06 DIAGNOSIS — D849 Immunodeficiency, unspecified: Secondary | ICD-10-CM | POA: Diagnosis not present

## 2022-07-06 DIAGNOSIS — T8611 Kidney transplant rejection: Secondary | ICD-10-CM | POA: Diagnosis not present

## 2022-07-06 DIAGNOSIS — Z940 Kidney transplant status: Secondary | ICD-10-CM | POA: Diagnosis not present

## 2022-07-06 DIAGNOSIS — I272 Pulmonary hypertension, unspecified: Secondary | ICD-10-CM | POA: Diagnosis not present

## 2022-07-06 DIAGNOSIS — N179 Acute kidney failure, unspecified: Secondary | ICD-10-CM | POA: Diagnosis not present

## 2022-07-07 DIAGNOSIS — N179 Acute kidney failure, unspecified: Secondary | ICD-10-CM | POA: Diagnosis not present

## 2022-07-07 DIAGNOSIS — R0600 Dyspnea, unspecified: Secondary | ICD-10-CM | POA: Diagnosis not present

## 2022-07-07 DIAGNOSIS — I272 Pulmonary hypertension, unspecified: Secondary | ICD-10-CM | POA: Diagnosis not present

## 2022-07-07 DIAGNOSIS — N2889 Other specified disorders of kidney and ureter: Secondary | ICD-10-CM | POA: Diagnosis not present

## 2022-07-07 DIAGNOSIS — D849 Immunodeficiency, unspecified: Secondary | ICD-10-CM | POA: Diagnosis not present

## 2022-07-07 DIAGNOSIS — Z940 Kidney transplant status: Secondary | ICD-10-CM | POA: Diagnosis not present

## 2022-07-07 DIAGNOSIS — T8619 Other complication of kidney transplant: Secondary | ICD-10-CM | POA: Diagnosis not present

## 2022-07-08 DIAGNOSIS — Z940 Kidney transplant status: Secondary | ICD-10-CM | POA: Diagnosis not present

## 2022-07-08 DIAGNOSIS — I272 Pulmonary hypertension, unspecified: Secondary | ICD-10-CM | POA: Diagnosis not present

## 2022-07-08 DIAGNOSIS — D849 Immunodeficiency, unspecified: Secondary | ICD-10-CM | POA: Diagnosis not present

## 2022-07-08 DIAGNOSIS — N179 Acute kidney failure, unspecified: Secondary | ICD-10-CM | POA: Diagnosis not present

## 2022-07-08 DIAGNOSIS — R0600 Dyspnea, unspecified: Secondary | ICD-10-CM | POA: Diagnosis not present

## 2022-07-09 DIAGNOSIS — D849 Immunodeficiency, unspecified: Secondary | ICD-10-CM | POA: Diagnosis not present

## 2022-07-09 DIAGNOSIS — Z940 Kidney transplant status: Secondary | ICD-10-CM | POA: Diagnosis not present

## 2022-07-09 DIAGNOSIS — R0600 Dyspnea, unspecified: Secondary | ICD-10-CM | POA: Diagnosis not present

## 2022-07-09 DIAGNOSIS — N179 Acute kidney failure, unspecified: Secondary | ICD-10-CM | POA: Diagnosis not present

## 2022-07-09 DIAGNOSIS — I272 Pulmonary hypertension, unspecified: Secondary | ICD-10-CM | POA: Diagnosis not present

## 2022-07-17 ENCOUNTER — Encounter: Payer: Self-pay | Admitting: *Deleted

## 2022-07-17 DIAGNOSIS — M3214 Glomerular disease in systemic lupus erythematosus: Secondary | ICD-10-CM | POA: Diagnosis not present

## 2022-07-17 DIAGNOSIS — N179 Acute kidney failure, unspecified: Secondary | ICD-10-CM | POA: Diagnosis not present

## 2022-07-17 DIAGNOSIS — N058 Unspecified nephritic syndrome with other morphologic changes: Secondary | ICD-10-CM | POA: Diagnosis not present

## 2022-07-17 DIAGNOSIS — B338 Other specified viral diseases: Secondary | ICD-10-CM | POA: Diagnosis not present

## 2022-07-17 DIAGNOSIS — Z5181 Encounter for therapeutic drug level monitoring: Secondary | ICD-10-CM | POA: Diagnosis not present

## 2022-07-17 DIAGNOSIS — T8619 Other complication of kidney transplant: Secondary | ICD-10-CM | POA: Diagnosis not present

## 2022-07-17 DIAGNOSIS — Z23 Encounter for immunization: Secondary | ICD-10-CM | POA: Diagnosis not present

## 2022-07-17 DIAGNOSIS — M328 Other forms of systemic lupus erythematosus: Secondary | ICD-10-CM | POA: Diagnosis not present

## 2022-07-17 DIAGNOSIS — T8611 Kidney transplant rejection: Secondary | ICD-10-CM | POA: Diagnosis not present

## 2022-07-17 DIAGNOSIS — Z940 Kidney transplant status: Secondary | ICD-10-CM | POA: Diagnosis not present

## 2022-07-17 DIAGNOSIS — Z792 Long term (current) use of antibiotics: Secondary | ICD-10-CM | POA: Diagnosis not present

## 2022-07-17 DIAGNOSIS — D849 Immunodeficiency, unspecified: Secondary | ICD-10-CM | POA: Diagnosis not present

## 2022-07-20 ENCOUNTER — Telehealth: Payer: Self-pay

## 2022-07-20 NOTE — Patient Outreach (Signed)
  Care Coordination [facility] Note    Transition Care Management Unsuccessful Follow-up Telephone Call  Date of discharge and from where:  07/09/22-[facility]  Attempts:  1st Attempt  Reason for unsuccessful TCM follow-up call:  Left voice message   Hetty Blend [place] Management Telephonic Care Management Coordinator Direct Phone: [phone] Toll Free: [phone] Fax: [phone]

## 2022-07-25 ENCOUNTER — Telehealth: Payer: Self-pay

## 2022-07-25 NOTE — Patient Outreach (Signed)
  Care Coordination [facility] Note Transition Care Management Unsuccessful Follow-up Telephone Call  Date of discharge and from where:  07/09/22-[facility]  Attempts:  2nd Attempt  Reason for unsuccessful TCM follow-up call:  No answer/busy   Enzo Montgomery, RN,BSN,CCM [place] Management Telephonic Care Management Coordinator Direct Phone: [phone] Toll Free: [phone] Fax: [phone]

## 2022-07-25 NOTE — Patient Outreach (Signed)
  Care Coordination [facility] Note Transition Care Management Unsuccessful Follow-up Telephone Call  Date of discharge and from where:  07/09/22-[facility]  Attempts:  3rd Attempt  Reason for unsuccessful TCM follow-up call:  No answer/busy   Enzo Montgomery, RN,BSN,CCM [place] Management Telephonic Care Management Coordinator Direct Phone: [phone] Toll Free: [phone] Fax: [phone]

## 2022-07-31 DIAGNOSIS — T8611 Kidney transplant rejection: Secondary | ICD-10-CM | POA: Diagnosis not present

## 2022-07-31 DIAGNOSIS — Z5181 Encounter for therapeutic drug level monitoring: Secondary | ICD-10-CM | POA: Diagnosis not present

## 2022-07-31 DIAGNOSIS — Z940 Kidney transplant status: Secondary | ICD-10-CM | POA: Diagnosis not present

## 2022-08-05 DIAGNOSIS — B338 Other specified viral diseases: Secondary | ICD-10-CM | POA: Diagnosis not present

## 2022-08-05 DIAGNOSIS — T8611 Kidney transplant rejection: Secondary | ICD-10-CM | POA: Diagnosis not present

## 2022-08-05 DIAGNOSIS — N179 Acute kidney failure, unspecified: Secondary | ICD-10-CM | POA: Diagnosis not present

## 2022-08-05 DIAGNOSIS — D849 Immunodeficiency, unspecified: Secondary | ICD-10-CM | POA: Diagnosis not present

## 2022-08-05 DIAGNOSIS — N058 Unspecified nephritic syndrome with other morphologic changes: Secondary | ICD-10-CM | POA: Diagnosis not present

## 2022-08-14 DIAGNOSIS — D702 Other drug-induced agranulocytosis: Secondary | ICD-10-CM | POA: Diagnosis not present

## 2022-08-14 DIAGNOSIS — I272 Pulmonary hypertension, unspecified: Secondary | ICD-10-CM | POA: Diagnosis not present

## 2022-08-14 DIAGNOSIS — D849 Immunodeficiency, unspecified: Secondary | ICD-10-CM | POA: Diagnosis not present

## 2022-08-14 DIAGNOSIS — Z940 Kidney transplant status: Secondary | ICD-10-CM | POA: Diagnosis not present

## 2022-08-14 DIAGNOSIS — Z48298 Encounter for aftercare following other organ transplant: Secondary | ICD-10-CM | POA: Diagnosis not present

## 2022-08-14 DIAGNOSIS — M3214 Glomerular disease in systemic lupus erythematosus: Secondary | ICD-10-CM | POA: Diagnosis not present

## 2022-08-14 DIAGNOSIS — I151 Hypertension secondary to other renal disorders: Secondary | ICD-10-CM | POA: Diagnosis not present

## 2022-08-19 DIAGNOSIS — B338 Other specified viral diseases: Secondary | ICD-10-CM | POA: Diagnosis not present

## 2022-08-19 DIAGNOSIS — T8611 Kidney transplant rejection: Secondary | ICD-10-CM | POA: Diagnosis not present

## 2022-08-19 DIAGNOSIS — N179 Acute kidney failure, unspecified: Secondary | ICD-10-CM | POA: Diagnosis not present

## 2022-08-19 DIAGNOSIS — D849 Immunodeficiency, unspecified: Secondary | ICD-10-CM | POA: Diagnosis not present

## 2022-08-19 DIAGNOSIS — N058 Unspecified nephritic syndrome with other morphologic changes: Secondary | ICD-10-CM | POA: Diagnosis not present

## 2022-08-28 DIAGNOSIS — Z940 Kidney transplant status: Secondary | ICD-10-CM | POA: Diagnosis not present

## 2022-08-28 DIAGNOSIS — Z5181 Encounter for therapeutic drug level monitoring: Secondary | ICD-10-CM | POA: Diagnosis not present

## 2022-09-02 DIAGNOSIS — N179 Acute kidney failure, unspecified: Secondary | ICD-10-CM | POA: Diagnosis not present

## 2022-09-02 DIAGNOSIS — D84821 Immunodeficiency due to drugs: Secondary | ICD-10-CM | POA: Diagnosis not present

## 2022-09-02 DIAGNOSIS — T8611 Kidney transplant rejection: Secondary | ICD-10-CM | POA: Diagnosis not present

## 2022-09-02 DIAGNOSIS — N058 Unspecified nephritic syndrome with other morphologic changes: Secondary | ICD-10-CM | POA: Diagnosis not present

## 2022-09-02 DIAGNOSIS — B338 Other specified viral diseases: Secondary | ICD-10-CM | POA: Diagnosis not present

## 2022-09-11 DIAGNOSIS — D849 Immunodeficiency, unspecified: Secondary | ICD-10-CM | POA: Diagnosis not present

## 2022-09-11 DIAGNOSIS — Z940 Kidney transplant status: Secondary | ICD-10-CM | POA: Diagnosis not present

## 2022-09-11 DIAGNOSIS — N058 Unspecified nephritic syndrome with other morphologic changes: Secondary | ICD-10-CM | POA: Diagnosis not present

## 2022-09-11 DIAGNOSIS — T8619 Other complication of kidney transplant: Secondary | ICD-10-CM | POA: Diagnosis not present

## 2022-09-11 DIAGNOSIS — Z4822 Encounter for aftercare following kidney transplant: Secondary | ICD-10-CM | POA: Diagnosis not present

## 2022-09-11 DIAGNOSIS — Z79621 Long term (current) use of calcineurin inhibitor: Secondary | ICD-10-CM | POA: Diagnosis not present

## 2022-09-11 DIAGNOSIS — Z792 Long term (current) use of antibiotics: Secondary | ICD-10-CM | POA: Diagnosis not present

## 2022-09-11 DIAGNOSIS — M328 Other forms of systemic lupus erythematosus: Secondary | ICD-10-CM | POA: Diagnosis not present

## 2022-09-11 DIAGNOSIS — D702 Other drug-induced agranulocytosis: Secondary | ICD-10-CM | POA: Diagnosis not present

## 2022-09-11 DIAGNOSIS — M329 Systemic lupus erythematosus, unspecified: Secondary | ICD-10-CM | POA: Diagnosis not present

## 2022-09-11 DIAGNOSIS — B338 Other specified viral diseases: Secondary | ICD-10-CM | POA: Diagnosis not present

## 2022-09-11 DIAGNOSIS — D84821 Immunodeficiency due to drugs: Secondary | ICD-10-CM | POA: Diagnosis not present

## 2022-09-11 DIAGNOSIS — I10 Essential (primary) hypertension: Secondary | ICD-10-CM | POA: Diagnosis not present

## 2022-09-11 DIAGNOSIS — I272 Pulmonary hypertension, unspecified: Secondary | ICD-10-CM | POA: Diagnosis not present

## 2022-09-16 DIAGNOSIS — N058 Unspecified nephritic syndrome with other morphologic changes: Secondary | ICD-10-CM | POA: Diagnosis not present

## 2022-09-16 DIAGNOSIS — D849 Immunodeficiency, unspecified: Secondary | ICD-10-CM | POA: Diagnosis not present

## 2022-09-16 DIAGNOSIS — B338 Other specified viral diseases: Secondary | ICD-10-CM | POA: Diagnosis not present

## 2022-09-16 DIAGNOSIS — N179 Acute kidney failure, unspecified: Secondary | ICD-10-CM | POA: Diagnosis not present

## 2022-09-16 DIAGNOSIS — T8611 Kidney transplant rejection: Secondary | ICD-10-CM | POA: Diagnosis not present

## 2022-09-26 DIAGNOSIS — Z5181 Encounter for therapeutic drug level monitoring: Secondary | ICD-10-CM | POA: Diagnosis not present

## 2022-09-26 DIAGNOSIS — Z940 Kidney transplant status: Secondary | ICD-10-CM | POA: Diagnosis not present

## 2022-09-30 DIAGNOSIS — N179 Acute kidney failure, unspecified: Secondary | ICD-10-CM | POA: Diagnosis not present

## 2022-09-30 DIAGNOSIS — N058 Unspecified nephritic syndrome with other morphologic changes: Secondary | ICD-10-CM | POA: Diagnosis not present

## 2022-09-30 DIAGNOSIS — D849 Immunodeficiency, unspecified: Secondary | ICD-10-CM | POA: Diagnosis not present

## 2022-09-30 DIAGNOSIS — B338 Other specified viral diseases: Secondary | ICD-10-CM | POA: Diagnosis not present

## 2022-09-30 DIAGNOSIS — T8611 Kidney transplant rejection: Secondary | ICD-10-CM | POA: Diagnosis not present

## 2022-10-11 DIAGNOSIS — Z5181 Encounter for therapeutic drug level monitoring: Secondary | ICD-10-CM | POA: Diagnosis not present

## 2022-10-11 DIAGNOSIS — Z79899 Other long term (current) drug therapy: Secondary | ICD-10-CM | POA: Diagnosis not present

## 2022-10-11 DIAGNOSIS — I7300 Raynaud's syndrome without gangrene: Secondary | ICD-10-CM | POA: Diagnosis not present

## 2022-10-11 DIAGNOSIS — R0602 Shortness of breath: Secondary | ICD-10-CM | POA: Diagnosis not present

## 2022-10-11 DIAGNOSIS — M3214 Glomerular disease in systemic lupus erythematosus: Secondary | ICD-10-CM | POA: Diagnosis not present

## 2022-10-11 DIAGNOSIS — Z940 Kidney transplant status: Secondary | ICD-10-CM | POA: Diagnosis not present

## 2022-10-11 DIAGNOSIS — I272 Pulmonary hypertension, unspecified: Secondary | ICD-10-CM | POA: Diagnosis not present

## 2022-10-11 DIAGNOSIS — D849 Immunodeficiency, unspecified: Secondary | ICD-10-CM | POA: Diagnosis not present

## 2022-10-11 DIAGNOSIS — M329 Systemic lupus erythematosus, unspecified: Secondary | ICD-10-CM | POA: Diagnosis not present

## 2022-10-25 DIAGNOSIS — R21 Rash and other nonspecific skin eruption: Secondary | ICD-10-CM | POA: Diagnosis not present

## 2022-10-25 DIAGNOSIS — Z940 Kidney transplant status: Secondary | ICD-10-CM | POA: Diagnosis not present

## 2022-10-25 DIAGNOSIS — Z5181 Encounter for therapeutic drug level monitoring: Secondary | ICD-10-CM | POA: Diagnosis not present

## 2022-10-28 DIAGNOSIS — T8611 Kidney transplant rejection: Secondary | ICD-10-CM | POA: Diagnosis not present

## 2022-10-28 DIAGNOSIS — N058 Unspecified nephritic syndrome with other morphologic changes: Secondary | ICD-10-CM | POA: Diagnosis not present

## 2022-10-28 DIAGNOSIS — Z940 Kidney transplant status: Secondary | ICD-10-CM | POA: Diagnosis not present

## 2022-10-28 DIAGNOSIS — B338 Other specified viral diseases: Secondary | ICD-10-CM | POA: Diagnosis not present

## 2022-10-28 DIAGNOSIS — N179 Acute kidney failure, unspecified: Secondary | ICD-10-CM | POA: Diagnosis not present

## 2022-10-28 DIAGNOSIS — D849 Immunodeficiency, unspecified: Secondary | ICD-10-CM | POA: Diagnosis not present

## 2022-11-02 DIAGNOSIS — B368 Other specified superficial mycoses: Secondary | ICD-10-CM | POA: Diagnosis not present

## 2022-11-02 DIAGNOSIS — L302 Cutaneous autosensitization: Secondary | ICD-10-CM | POA: Diagnosis not present

## 2022-11-08 DIAGNOSIS — Z940 Kidney transplant status: Secondary | ICD-10-CM | POA: Diagnosis not present

## 2022-11-08 DIAGNOSIS — Z5181 Encounter for therapeutic drug level monitoring: Secondary | ICD-10-CM | POA: Diagnosis not present

## 2022-11-13 DIAGNOSIS — B338 Other specified viral diseases: Secondary | ICD-10-CM | POA: Diagnosis not present

## 2022-11-13 DIAGNOSIS — I129 Hypertensive chronic kidney disease with stage 1 through stage 4 chronic kidney disease, or unspecified chronic kidney disease: Secondary | ICD-10-CM | POA: Diagnosis not present

## 2022-11-13 DIAGNOSIS — Z7982 Long term (current) use of aspirin: Secondary | ICD-10-CM | POA: Diagnosis not present

## 2022-11-13 DIAGNOSIS — Z7952 Long term (current) use of systemic steroids: Secondary | ICD-10-CM | POA: Diagnosis not present

## 2022-11-13 DIAGNOSIS — Z792 Long term (current) use of antibiotics: Secondary | ICD-10-CM | POA: Diagnosis not present

## 2022-11-13 DIAGNOSIS — M3214 Glomerular disease in systemic lupus erythematosus: Secondary | ICD-10-CM | POA: Diagnosis not present

## 2022-11-13 DIAGNOSIS — N058 Unspecified nephritic syndrome with other morphologic changes: Secondary | ICD-10-CM | POA: Diagnosis not present

## 2022-11-13 DIAGNOSIS — T8619 Other complication of kidney transplant: Secondary | ICD-10-CM | POA: Diagnosis not present

## 2022-11-13 DIAGNOSIS — Z8616 Personal history of COVID-19: Secondary | ICD-10-CM | POA: Diagnosis not present

## 2022-11-13 DIAGNOSIS — D849 Immunodeficiency, unspecified: Secondary | ICD-10-CM | POA: Diagnosis not present

## 2022-11-13 DIAGNOSIS — Z79899 Other long term (current) drug therapy: Secondary | ICD-10-CM | POA: Diagnosis not present

## 2022-11-13 DIAGNOSIS — I272 Pulmonary hypertension, unspecified: Secondary | ICD-10-CM | POA: Diagnosis not present

## 2022-11-13 DIAGNOSIS — Z8673 Personal history of transient ischemic attack (TIA), and cerebral infarction without residual deficits: Secondary | ICD-10-CM | POA: Diagnosis not present

## 2022-11-13 DIAGNOSIS — Z940 Kidney transplant status: Secondary | ICD-10-CM | POA: Diagnosis not present

## 2022-11-13 DIAGNOSIS — N184 Chronic kidney disease, stage 4 (severe): Secondary | ICD-10-CM | POA: Diagnosis not present

## 2022-11-13 DIAGNOSIS — T8611 Kidney transplant rejection: Secondary | ICD-10-CM | POA: Diagnosis not present

## 2022-11-23 DIAGNOSIS — R413 Other amnesia: Secondary | ICD-10-CM | POA: Diagnosis not present

## 2022-11-23 DIAGNOSIS — Z7682 Awaiting organ transplant status: Secondary | ICD-10-CM | POA: Diagnosis not present

## 2022-11-23 DIAGNOSIS — Z008 Encounter for other general examination: Secondary | ICD-10-CM | POA: Diagnosis not present

## 2022-11-23 DIAGNOSIS — Z01818 Encounter for other preprocedural examination: Secondary | ICD-10-CM | POA: Diagnosis not present

## 2022-11-23 DIAGNOSIS — Z940 Kidney transplant status: Secondary | ICD-10-CM | POA: Diagnosis not present

## 2022-11-26 DIAGNOSIS — T8611 Kidney transplant rejection: Secondary | ICD-10-CM | POA: Diagnosis not present

## 2022-11-26 DIAGNOSIS — D849 Immunodeficiency, unspecified: Secondary | ICD-10-CM | POA: Diagnosis not present

## 2022-11-26 DIAGNOSIS — N179 Acute kidney failure, unspecified: Secondary | ICD-10-CM | POA: Diagnosis not present

## 2022-11-26 DIAGNOSIS — N058 Unspecified nephritic syndrome with other morphologic changes: Secondary | ICD-10-CM | POA: Diagnosis not present

## 2022-11-26 DIAGNOSIS — B338 Other specified viral diseases: Secondary | ICD-10-CM | POA: Diagnosis not present

## 2022-11-27 DIAGNOSIS — I272 Pulmonary hypertension, unspecified: Secondary | ICD-10-CM | POA: Diagnosis not present

## 2022-11-27 DIAGNOSIS — R0602 Shortness of breath: Secondary | ICD-10-CM | POA: Diagnosis not present

## 2022-11-27 DIAGNOSIS — Z79899 Other long term (current) drug therapy: Secondary | ICD-10-CM | POA: Diagnosis not present

## 2022-11-29 DIAGNOSIS — Z940 Kidney transplant status: Secondary | ICD-10-CM | POA: Diagnosis not present

## 2022-11-29 DIAGNOSIS — Z5181 Encounter for therapeutic drug level monitoring: Secondary | ICD-10-CM | POA: Diagnosis not present

## 2022-11-30 DIAGNOSIS — H40021 Open angle with borderline findings, high risk, right eye: Secondary | ICD-10-CM | POA: Diagnosis not present

## 2022-11-30 DIAGNOSIS — Z135 Encounter for screening for eye and ear disorders: Secondary | ICD-10-CM | POA: Diagnosis not present

## 2022-11-30 DIAGNOSIS — M3210 Systemic lupus erythematosus, organ or system involvement unspecified: Secondary | ICD-10-CM | POA: Diagnosis not present

## 2022-11-30 DIAGNOSIS — H30021 Focal chorioretinal inflammation of posterior pole, right eye: Secondary | ICD-10-CM | POA: Diagnosis not present

## 2022-11-30 DIAGNOSIS — Z79899 Other long term (current) drug therapy: Secondary | ICD-10-CM | POA: Diagnosis not present

## 2022-12-06 DIAGNOSIS — R0600 Dyspnea, unspecified: Secondary | ICD-10-CM | POA: Diagnosis not present

## 2022-12-06 DIAGNOSIS — R0689 Other abnormalities of breathing: Secondary | ICD-10-CM | POA: Diagnosis not present

## 2022-12-06 DIAGNOSIS — I272 Pulmonary hypertension, unspecified: Secondary | ICD-10-CM | POA: Diagnosis not present

## 2022-12-06 DIAGNOSIS — Z79899 Other long term (current) drug therapy: Secondary | ICD-10-CM | POA: Diagnosis not present

## 2022-12-06 DIAGNOSIS — R0602 Shortness of breath: Secondary | ICD-10-CM | POA: Diagnosis not present

## 2022-12-09 DIAGNOSIS — N179 Acute kidney failure, unspecified: Secondary | ICD-10-CM | POA: Diagnosis not present

## 2022-12-09 DIAGNOSIS — T8611 Kidney transplant rejection: Secondary | ICD-10-CM | POA: Diagnosis not present

## 2022-12-09 DIAGNOSIS — B338 Other specified viral diseases: Secondary | ICD-10-CM | POA: Diagnosis not present

## 2022-12-09 DIAGNOSIS — N058 Unspecified nephritic syndrome with other morphologic changes: Secondary | ICD-10-CM | POA: Diagnosis not present

## 2022-12-09 DIAGNOSIS — D849 Immunodeficiency, unspecified: Secondary | ICD-10-CM | POA: Diagnosis not present

## 2022-12-21 DIAGNOSIS — T8619 Other complication of kidney transplant: Secondary | ICD-10-CM | POA: Diagnosis not present

## 2022-12-21 DIAGNOSIS — Z01818 Encounter for other preprocedural examination: Secondary | ICD-10-CM | POA: Diagnosis not present

## 2022-12-21 DIAGNOSIS — N184 Chronic kidney disease, stage 4 (severe): Secondary | ICD-10-CM | POA: Diagnosis not present

## 2022-12-21 DIAGNOSIS — T8611 Kidney transplant rejection: Secondary | ICD-10-CM | POA: Diagnosis not present

## 2022-12-21 DIAGNOSIS — B338 Other specified viral diseases: Secondary | ICD-10-CM | POA: Diagnosis not present

## 2022-12-21 DIAGNOSIS — Z7682 Awaiting organ transplant status: Secondary | ICD-10-CM | POA: Diagnosis not present

## 2022-12-21 DIAGNOSIS — M328 Other forms of systemic lupus erythematosus: Secondary | ICD-10-CM | POA: Diagnosis not present

## 2022-12-21 DIAGNOSIS — Z79899 Other long term (current) drug therapy: Secondary | ICD-10-CM | POA: Diagnosis not present

## 2022-12-21 DIAGNOSIS — D849 Immunodeficiency, unspecified: Secondary | ICD-10-CM | POA: Diagnosis not present

## 2022-12-21 DIAGNOSIS — Z940 Kidney transplant status: Secondary | ICD-10-CM | POA: Diagnosis not present

## 2022-12-21 DIAGNOSIS — I272 Pulmonary hypertension, unspecified: Secondary | ICD-10-CM | POA: Diagnosis not present

## 2022-12-21 DIAGNOSIS — Z1159 Encounter for screening for other viral diseases: Secondary | ICD-10-CM | POA: Diagnosis not present

## 2022-12-21 DIAGNOSIS — Z7982 Long term (current) use of aspirin: Secondary | ICD-10-CM | POA: Diagnosis not present

## 2022-12-21 DIAGNOSIS — N058 Unspecified nephritic syndrome with other morphologic changes: Secondary | ICD-10-CM | POA: Diagnosis not present

## 2022-12-21 DIAGNOSIS — Z5181 Encounter for therapeutic drug level monitoring: Secondary | ICD-10-CM | POA: Diagnosis not present

## 2022-12-22 DIAGNOSIS — Z79899 Other long term (current) drug therapy: Secondary | ICD-10-CM | POA: Diagnosis not present

## 2022-12-22 DIAGNOSIS — H35413 Lattice degeneration of retina, bilateral: Secondary | ICD-10-CM | POA: Diagnosis not present

## 2022-12-22 DIAGNOSIS — H35363 Drusen (degenerative) of macula, bilateral: Secondary | ICD-10-CM | POA: Diagnosis not present

## 2022-12-23 DIAGNOSIS — D849 Immunodeficiency, unspecified: Secondary | ICD-10-CM | POA: Diagnosis not present

## 2022-12-23 DIAGNOSIS — B338 Other specified viral diseases: Secondary | ICD-10-CM | POA: Diagnosis not present

## 2022-12-23 DIAGNOSIS — N058 Unspecified nephritic syndrome with other morphologic changes: Secondary | ICD-10-CM | POA: Diagnosis not present

## 2022-12-23 DIAGNOSIS — T8611 Kidney transplant rejection: Secondary | ICD-10-CM | POA: Diagnosis not present

## 2022-12-23 DIAGNOSIS — N179 Acute kidney failure, unspecified: Secondary | ICD-10-CM | POA: Diagnosis not present

## 2022-12-23 DIAGNOSIS — Z940 Kidney transplant status: Secondary | ICD-10-CM | POA: Diagnosis not present

## 2023-01-01 DIAGNOSIS — L700 Acne vulgaris: Secondary | ICD-10-CM | POA: Diagnosis not present

## 2023-01-02 DIAGNOSIS — Z940 Kidney transplant status: Secondary | ICD-10-CM | POA: Diagnosis not present

## 2023-01-02 DIAGNOSIS — Z5181 Encounter for therapeutic drug level monitoring: Secondary | ICD-10-CM | POA: Diagnosis not present

## 2023-01-06 DIAGNOSIS — N179 Acute kidney failure, unspecified: Secondary | ICD-10-CM | POA: Diagnosis not present

## 2023-01-06 DIAGNOSIS — Z940 Kidney transplant status: Secondary | ICD-10-CM | POA: Diagnosis not present

## 2023-01-06 DIAGNOSIS — N058 Unspecified nephritic syndrome with other morphologic changes: Secondary | ICD-10-CM | POA: Diagnosis not present

## 2023-01-06 DIAGNOSIS — T8611 Kidney transplant rejection: Secondary | ICD-10-CM | POA: Diagnosis not present

## 2023-01-06 DIAGNOSIS — B338 Other specified viral diseases: Secondary | ICD-10-CM | POA: Diagnosis not present

## 2023-01-06 DIAGNOSIS — D849 Immunodeficiency, unspecified: Secondary | ICD-10-CM | POA: Diagnosis not present

## 2023-01-15 DIAGNOSIS — Z79899 Other long term (current) drug therapy: Secondary | ICD-10-CM | POA: Diagnosis not present

## 2023-01-15 DIAGNOSIS — H35413 Lattice degeneration of retina, bilateral: Secondary | ICD-10-CM | POA: Diagnosis not present

## 2023-01-15 DIAGNOSIS — H35363 Drusen (degenerative) of macula, bilateral: Secondary | ICD-10-CM | POA: Diagnosis not present

## 2023-01-16 DIAGNOSIS — Z5181 Encounter for therapeutic drug level monitoring: Secondary | ICD-10-CM | POA: Diagnosis not present

## 2023-01-16 DIAGNOSIS — T8611 Kidney transplant rejection: Secondary | ICD-10-CM | POA: Diagnosis not present

## 2023-01-16 DIAGNOSIS — B348 Other viral infections of unspecified site: Secondary | ICD-10-CM | POA: Diagnosis not present

## 2023-01-16 DIAGNOSIS — B338 Other specified viral diseases: Secondary | ICD-10-CM | POA: Diagnosis not present

## 2023-01-16 DIAGNOSIS — I10 Essential (primary) hypertension: Secondary | ICD-10-CM | POA: Diagnosis not present

## 2023-01-16 DIAGNOSIS — N184 Chronic kidney disease, stage 4 (severe): Secondary | ICD-10-CM | POA: Diagnosis not present

## 2023-01-16 DIAGNOSIS — N058 Unspecified nephritic syndrome with other morphologic changes: Secondary | ICD-10-CM | POA: Diagnosis not present

## 2023-01-16 DIAGNOSIS — I129 Hypertensive chronic kidney disease with stage 1 through stage 4 chronic kidney disease, or unspecified chronic kidney disease: Secondary | ICD-10-CM | POA: Diagnosis not present

## 2023-01-16 DIAGNOSIS — Z79899 Other long term (current) drug therapy: Secondary | ICD-10-CM | POA: Diagnosis not present

## 2023-01-16 DIAGNOSIS — Z7982 Long term (current) use of aspirin: Secondary | ICD-10-CM | POA: Diagnosis not present

## 2023-01-16 DIAGNOSIS — Z940 Kidney transplant status: Secondary | ICD-10-CM | POA: Diagnosis not present

## 2023-01-16 DIAGNOSIS — D849 Immunodeficiency, unspecified: Secondary | ICD-10-CM | POA: Diagnosis not present

## 2023-01-16 DIAGNOSIS — Z01810 Encounter for preprocedural cardiovascular examination: Secondary | ICD-10-CM | POA: Diagnosis not present

## 2023-01-16 DIAGNOSIS — Z792 Long term (current) use of antibiotics: Secondary | ICD-10-CM | POA: Diagnosis not present

## 2023-01-20 DIAGNOSIS — B338 Other specified viral diseases: Secondary | ICD-10-CM | POA: Diagnosis not present

## 2023-01-20 DIAGNOSIS — D849 Immunodeficiency, unspecified: Secondary | ICD-10-CM | POA: Diagnosis not present

## 2023-01-20 DIAGNOSIS — N179 Acute kidney failure, unspecified: Secondary | ICD-10-CM | POA: Diagnosis not present

## 2023-01-20 DIAGNOSIS — N058 Unspecified nephritic syndrome with other morphologic changes: Secondary | ICD-10-CM | POA: Diagnosis not present

## 2023-01-20 DIAGNOSIS — T8611 Kidney transplant rejection: Secondary | ICD-10-CM | POA: Diagnosis not present

## 2023-01-24 DIAGNOSIS — Z7682 Awaiting organ transplant status: Secondary | ICD-10-CM | POA: Diagnosis not present

## 2023-01-26 DIAGNOSIS — Z7682 Awaiting organ transplant status: Secondary | ICD-10-CM | POA: Diagnosis not present

## 2023-01-26 DIAGNOSIS — R935 Abnormal findings on diagnostic imaging of other abdominal regions, including retroperitoneum: Secondary | ICD-10-CM | POA: Diagnosis not present

## 2023-01-31 DIAGNOSIS — Z7682 Awaiting organ transplant status: Secondary | ICD-10-CM | POA: Diagnosis not present

## 2023-02-03 DIAGNOSIS — D849 Immunodeficiency, unspecified: Secondary | ICD-10-CM | POA: Diagnosis not present

## 2023-02-03 DIAGNOSIS — N058 Unspecified nephritic syndrome with other morphologic changes: Secondary | ICD-10-CM | POA: Diagnosis not present

## 2023-02-03 DIAGNOSIS — B338 Other specified viral diseases: Secondary | ICD-10-CM | POA: Diagnosis not present

## 2023-02-03 DIAGNOSIS — T8611 Kidney transplant rejection: Secondary | ICD-10-CM | POA: Diagnosis not present

## 2023-02-03 DIAGNOSIS — N179 Acute kidney failure, unspecified: Secondary | ICD-10-CM | POA: Diagnosis not present

## 2023-02-07 DIAGNOSIS — Z7682 Awaiting organ transplant status: Secondary | ICD-10-CM | POA: Diagnosis not present

## 2023-02-14 DIAGNOSIS — Z7682 Awaiting organ transplant status: Secondary | ICD-10-CM | POA: Diagnosis not present

## 2023-02-21 DIAGNOSIS — Z7682 Awaiting organ transplant status: Secondary | ICD-10-CM | POA: Diagnosis not present

## 2023-02-26 DIAGNOSIS — Z0189 Encounter for other specified special examinations: Secondary | ICD-10-CM | POA: Diagnosis not present

## 2023-02-26 DIAGNOSIS — S93325A Dislocation of tarsometatarsal joint of left foot, initial encounter: Secondary | ICD-10-CM | POA: Diagnosis not present

## 2023-02-26 DIAGNOSIS — S99922D Unspecified injury of left foot, subsequent encounter: Secondary | ICD-10-CM | POA: Diagnosis not present

## 2023-02-27 DIAGNOSIS — S92342A Displaced fracture of fourth metatarsal bone, left foot, initial encounter for closed fracture: Secondary | ICD-10-CM | POA: Diagnosis not present

## 2023-02-27 DIAGNOSIS — S92322A Displaced fracture of second metatarsal bone, left foot, initial encounter for closed fracture: Secondary | ICD-10-CM | POA: Diagnosis not present

## 2023-02-27 DIAGNOSIS — S92332A Displaced fracture of third metatarsal bone, left foot, initial encounter for closed fracture: Secondary | ICD-10-CM | POA: Diagnosis not present

## 2023-02-28 DIAGNOSIS — Z7682 Awaiting organ transplant status: Secondary | ICD-10-CM | POA: Diagnosis not present

## 2023-03-03 DIAGNOSIS — B338 Other specified viral diseases: Secondary | ICD-10-CM | POA: Diagnosis not present

## 2023-03-03 DIAGNOSIS — Z940 Kidney transplant status: Secondary | ICD-10-CM | POA: Diagnosis not present

## 2023-03-03 DIAGNOSIS — D849 Immunodeficiency, unspecified: Secondary | ICD-10-CM | POA: Diagnosis not present

## 2023-03-03 DIAGNOSIS — T8611 Kidney transplant rejection: Secondary | ICD-10-CM | POA: Diagnosis not present

## 2023-03-03 DIAGNOSIS — N058 Unspecified nephritic syndrome with other morphologic changes: Secondary | ICD-10-CM | POA: Diagnosis not present

## 2023-03-05 DIAGNOSIS — S93325A Dislocation of tarsometatarsal joint of left foot, initial encounter: Secondary | ICD-10-CM | POA: Diagnosis not present

## 2023-03-14 DIAGNOSIS — Z7682 Awaiting organ transplant status: Secondary | ICD-10-CM | POA: Diagnosis not present

## 2023-03-16 DIAGNOSIS — R0600 Dyspnea, unspecified: Secondary | ICD-10-CM | POA: Diagnosis not present

## 2023-03-16 DIAGNOSIS — Z5181 Encounter for therapeutic drug level monitoring: Secondary | ICD-10-CM | POA: Diagnosis not present

## 2023-03-16 DIAGNOSIS — Z940 Kidney transplant status: Secondary | ICD-10-CM | POA: Diagnosis not present

## 2023-03-16 DIAGNOSIS — I272 Pulmonary hypertension, unspecified: Secondary | ICD-10-CM | POA: Diagnosis not present

## 2023-03-16 DIAGNOSIS — R0689 Other abnormalities of breathing: Secondary | ICD-10-CM | POA: Diagnosis not present

## 2023-03-21 DIAGNOSIS — Z7682 Awaiting organ transplant status: Secondary | ICD-10-CM | POA: Diagnosis not present

## 2023-03-26 DIAGNOSIS — S93325A Dislocation of tarsometatarsal joint of left foot, initial encounter: Secondary | ICD-10-CM | POA: Diagnosis not present

## 2023-03-28 DIAGNOSIS — Z7682 Awaiting organ transplant status: Secondary | ICD-10-CM | POA: Diagnosis not present

## 2023-03-31 DIAGNOSIS — B338 Other specified viral diseases: Secondary | ICD-10-CM | POA: Diagnosis not present

## 2023-03-31 DIAGNOSIS — T8611 Kidney transplant rejection: Secondary | ICD-10-CM | POA: Diagnosis not present

## 2023-03-31 DIAGNOSIS — D849 Immunodeficiency, unspecified: Secondary | ICD-10-CM | POA: Diagnosis not present

## 2023-03-31 DIAGNOSIS — Z940 Kidney transplant status: Secondary | ICD-10-CM | POA: Diagnosis not present

## 2023-03-31 DIAGNOSIS — N058 Unspecified nephritic syndrome with other morphologic changes: Secondary | ICD-10-CM | POA: Diagnosis not present

## 2023-04-13 DIAGNOSIS — M329 Systemic lupus erythematosus, unspecified: Secondary | ICD-10-CM | POA: Diagnosis not present

## 2023-04-13 DIAGNOSIS — Z79899 Other long term (current) drug therapy: Secondary | ICD-10-CM | POA: Diagnosis not present

## 2023-04-13 DIAGNOSIS — M3214 Glomerular disease in systemic lupus erythematosus: Secondary | ICD-10-CM | POA: Diagnosis not present

## 2023-04-16 DIAGNOSIS — S93325A Dislocation of tarsometatarsal joint of left foot, initial encounter: Secondary | ICD-10-CM | POA: Diagnosis not present

## 2023-04-18 DIAGNOSIS — Z7682 Awaiting organ transplant status: Secondary | ICD-10-CM | POA: Diagnosis not present

## 2023-04-20 DIAGNOSIS — N058 Unspecified nephritic syndrome with other morphologic changes: Secondary | ICD-10-CM | POA: Diagnosis not present

## 2023-04-20 DIAGNOSIS — I272 Pulmonary hypertension, unspecified: Secondary | ICD-10-CM | POA: Diagnosis not present

## 2023-04-20 DIAGNOSIS — Z940 Kidney transplant status: Secondary | ICD-10-CM | POA: Diagnosis not present

## 2023-04-20 DIAGNOSIS — T8619 Other complication of kidney transplant: Secondary | ICD-10-CM | POA: Diagnosis not present

## 2023-04-20 DIAGNOSIS — M3214 Glomerular disease in systemic lupus erythematosus: Secondary | ICD-10-CM | POA: Diagnosis not present

## 2023-04-20 DIAGNOSIS — B338 Other specified viral diseases: Secondary | ICD-10-CM | POA: Diagnosis not present

## 2023-04-20 DIAGNOSIS — Z7185 Encounter for immunization safety counseling: Secondary | ICD-10-CM | POA: Diagnosis not present

## 2023-04-20 DIAGNOSIS — D849 Immunodeficiency, unspecified: Secondary | ICD-10-CM | POA: Diagnosis not present

## 2023-04-20 DIAGNOSIS — N184 Chronic kidney disease, stage 4 (severe): Secondary | ICD-10-CM | POA: Diagnosis not present

## 2023-04-25 DIAGNOSIS — Z7682 Awaiting organ transplant status: Secondary | ICD-10-CM | POA: Diagnosis not present

## 2023-04-30 DIAGNOSIS — D849 Immunodeficiency, unspecified: Secondary | ICD-10-CM | POA: Diagnosis not present

## 2023-04-30 DIAGNOSIS — Z940 Kidney transplant status: Secondary | ICD-10-CM | POA: Diagnosis not present

## 2023-04-30 DIAGNOSIS — B338 Other specified viral diseases: Secondary | ICD-10-CM | POA: Diagnosis not present

## 2023-04-30 DIAGNOSIS — N058 Unspecified nephritic syndrome with other morphologic changes: Secondary | ICD-10-CM | POA: Diagnosis not present

## 2023-04-30 DIAGNOSIS — N179 Acute kidney failure, unspecified: Secondary | ICD-10-CM | POA: Diagnosis not present

## 2023-04-30 DIAGNOSIS — T8611 Kidney transplant rejection: Secondary | ICD-10-CM | POA: Diagnosis not present

## 2023-05-07 DIAGNOSIS — M3214 Glomerular disease in systemic lupus erythematosus: Secondary | ICD-10-CM | POA: Diagnosis not present

## 2023-05-07 DIAGNOSIS — Z79899 Other long term (current) drug therapy: Secondary | ICD-10-CM | POA: Diagnosis not present

## 2023-05-07 DIAGNOSIS — M329 Systemic lupus erythematosus, unspecified: Secondary | ICD-10-CM | POA: Diagnosis not present

## 2023-05-14 DIAGNOSIS — S99922D Unspecified injury of left foot, subsequent encounter: Secondary | ICD-10-CM | POA: Diagnosis not present

## 2023-05-14 DIAGNOSIS — S93325A Dislocation of tarsometatarsal joint of left foot, initial encounter: Secondary | ICD-10-CM | POA: Diagnosis not present

## 2023-05-15 DIAGNOSIS — S93325D Dislocation of tarsometatarsal joint of left foot, subsequent encounter: Secondary | ICD-10-CM | POA: Diagnosis not present

## 2023-05-15 DIAGNOSIS — R262 Difficulty in walking, not elsewhere classified: Secondary | ICD-10-CM | POA: Diagnosis not present

## 2023-05-15 DIAGNOSIS — M79672 Pain in left foot: Secondary | ICD-10-CM | POA: Diagnosis not present

## 2023-05-15 DIAGNOSIS — M6281 Muscle weakness (generalized): Secondary | ICD-10-CM | POA: Diagnosis not present

## 2023-05-15 DIAGNOSIS — R2689 Other abnormalities of gait and mobility: Secondary | ICD-10-CM | POA: Diagnosis not present

## 2023-05-17 DIAGNOSIS — M79672 Pain in left foot: Secondary | ICD-10-CM | POA: Diagnosis not present

## 2023-05-17 DIAGNOSIS — M6281 Muscle weakness (generalized): Secondary | ICD-10-CM | POA: Diagnosis not present

## 2023-05-17 DIAGNOSIS — S93325D Dislocation of tarsometatarsal joint of left foot, subsequent encounter: Secondary | ICD-10-CM | POA: Diagnosis not present

## 2023-05-17 DIAGNOSIS — R2689 Other abnormalities of gait and mobility: Secondary | ICD-10-CM | POA: Diagnosis not present

## 2023-05-17 DIAGNOSIS — R262 Difficulty in walking, not elsewhere classified: Secondary | ICD-10-CM | POA: Diagnosis not present

## 2023-05-24 DIAGNOSIS — M6281 Muscle weakness (generalized): Secondary | ICD-10-CM | POA: Diagnosis not present

## 2023-05-24 DIAGNOSIS — R2689 Other abnormalities of gait and mobility: Secondary | ICD-10-CM | POA: Diagnosis not present

## 2023-05-24 DIAGNOSIS — M79672 Pain in left foot: Secondary | ICD-10-CM | POA: Diagnosis not present

## 2023-05-24 DIAGNOSIS — R262 Difficulty in walking, not elsewhere classified: Secondary | ICD-10-CM | POA: Diagnosis not present

## 2023-05-24 DIAGNOSIS — S93325D Dislocation of tarsometatarsal joint of left foot, subsequent encounter: Secondary | ICD-10-CM | POA: Diagnosis not present

## 2023-05-31 DIAGNOSIS — R2689 Other abnormalities of gait and mobility: Secondary | ICD-10-CM | POA: Diagnosis not present

## 2023-05-31 DIAGNOSIS — S93325D Dislocation of tarsometatarsal joint of left foot, subsequent encounter: Secondary | ICD-10-CM | POA: Diagnosis not present

## 2023-05-31 DIAGNOSIS — M6281 Muscle weakness (generalized): Secondary | ICD-10-CM | POA: Diagnosis not present

## 2023-05-31 DIAGNOSIS — R262 Difficulty in walking, not elsewhere classified: Secondary | ICD-10-CM | POA: Diagnosis not present

## 2023-05-31 DIAGNOSIS — M79672 Pain in left foot: Secondary | ICD-10-CM | POA: Diagnosis not present

## 2023-06-02 DIAGNOSIS — N058 Unspecified nephritic syndrome with other morphologic changes: Secondary | ICD-10-CM | POA: Diagnosis not present

## 2023-06-02 DIAGNOSIS — T8611 Kidney transplant rejection: Secondary | ICD-10-CM | POA: Diagnosis not present

## 2023-06-02 DIAGNOSIS — N179 Acute kidney failure, unspecified: Secondary | ICD-10-CM | POA: Diagnosis not present

## 2023-06-02 DIAGNOSIS — D849 Immunodeficiency, unspecified: Secondary | ICD-10-CM | POA: Diagnosis not present

## 2023-06-02 DIAGNOSIS — Z940 Kidney transplant status: Secondary | ICD-10-CM | POA: Diagnosis not present

## 2023-06-02 DIAGNOSIS — B338 Other specified viral diseases: Secondary | ICD-10-CM | POA: Diagnosis not present

## 2023-06-04 DIAGNOSIS — M6281 Muscle weakness (generalized): Secondary | ICD-10-CM | POA: Diagnosis not present

## 2023-06-04 DIAGNOSIS — R262 Difficulty in walking, not elsewhere classified: Secondary | ICD-10-CM | POA: Diagnosis not present

## 2023-06-04 DIAGNOSIS — R2689 Other abnormalities of gait and mobility: Secondary | ICD-10-CM | POA: Diagnosis not present

## 2023-06-04 DIAGNOSIS — S93325D Dislocation of tarsometatarsal joint of left foot, subsequent encounter: Secondary | ICD-10-CM | POA: Diagnosis not present

## 2023-06-04 DIAGNOSIS — M79672 Pain in left foot: Secondary | ICD-10-CM | POA: Diagnosis not present

## 2023-06-07 DIAGNOSIS — M6281 Muscle weakness (generalized): Secondary | ICD-10-CM | POA: Diagnosis not present

## 2023-06-07 DIAGNOSIS — R262 Difficulty in walking, not elsewhere classified: Secondary | ICD-10-CM | POA: Diagnosis not present

## 2023-06-07 DIAGNOSIS — M79672 Pain in left foot: Secondary | ICD-10-CM | POA: Diagnosis not present

## 2023-06-07 DIAGNOSIS — S93325D Dislocation of tarsometatarsal joint of left foot, subsequent encounter: Secondary | ICD-10-CM | POA: Diagnosis not present

## 2023-06-07 DIAGNOSIS — R2689 Other abnormalities of gait and mobility: Secondary | ICD-10-CM | POA: Diagnosis not present

## 2023-06-11 DIAGNOSIS — M79672 Pain in left foot: Secondary | ICD-10-CM | POA: Diagnosis not present

## 2023-06-11 DIAGNOSIS — R262 Difficulty in walking, not elsewhere classified: Secondary | ICD-10-CM | POA: Diagnosis not present

## 2023-06-11 DIAGNOSIS — M6281 Muscle weakness (generalized): Secondary | ICD-10-CM | POA: Diagnosis not present

## 2023-06-11 DIAGNOSIS — R2689 Other abnormalities of gait and mobility: Secondary | ICD-10-CM | POA: Diagnosis not present

## 2023-06-11 DIAGNOSIS — S93325D Dislocation of tarsometatarsal joint of left foot, subsequent encounter: Secondary | ICD-10-CM | POA: Diagnosis not present

## 2023-06-12 DIAGNOSIS — Z5181 Encounter for therapeutic drug level monitoring: Secondary | ICD-10-CM | POA: Diagnosis not present

## 2023-06-12 DIAGNOSIS — Z940 Kidney transplant status: Secondary | ICD-10-CM | POA: Diagnosis not present

## 2023-06-14 DIAGNOSIS — S93325A Dislocation of tarsometatarsal joint of left foot, initial encounter: Secondary | ICD-10-CM | POA: Diagnosis not present

## 2023-06-14 DIAGNOSIS — M6281 Muscle weakness (generalized): Secondary | ICD-10-CM | POA: Diagnosis not present

## 2023-06-14 DIAGNOSIS — R262 Difficulty in walking, not elsewhere classified: Secondary | ICD-10-CM | POA: Diagnosis not present

## 2023-06-14 DIAGNOSIS — R2689 Other abnormalities of gait and mobility: Secondary | ICD-10-CM | POA: Diagnosis not present

## 2023-06-14 DIAGNOSIS — M79672 Pain in left foot: Secondary | ICD-10-CM | POA: Diagnosis not present

## 2023-06-19 DIAGNOSIS — S93325A Dislocation of tarsometatarsal joint of left foot, initial encounter: Secondary | ICD-10-CM | POA: Diagnosis not present

## 2023-06-19 DIAGNOSIS — M6281 Muscle weakness (generalized): Secondary | ICD-10-CM | POA: Diagnosis not present

## 2023-06-19 DIAGNOSIS — M79672 Pain in left foot: Secondary | ICD-10-CM | POA: Diagnosis not present

## 2023-06-19 DIAGNOSIS — R262 Difficulty in walking, not elsewhere classified: Secondary | ICD-10-CM | POA: Diagnosis not present

## 2023-06-19 DIAGNOSIS — R2689 Other abnormalities of gait and mobility: Secondary | ICD-10-CM | POA: Diagnosis not present

## 2023-06-21 DIAGNOSIS — R2689 Other abnormalities of gait and mobility: Secondary | ICD-10-CM | POA: Diagnosis not present

## 2023-06-21 DIAGNOSIS — S93325A Dislocation of tarsometatarsal joint of left foot, initial encounter: Secondary | ICD-10-CM | POA: Diagnosis not present

## 2023-06-21 DIAGNOSIS — M6281 Muscle weakness (generalized): Secondary | ICD-10-CM | POA: Diagnosis not present

## 2023-06-21 DIAGNOSIS — R262 Difficulty in walking, not elsewhere classified: Secondary | ICD-10-CM | POA: Diagnosis not present

## 2023-06-21 DIAGNOSIS — M79672 Pain in left foot: Secondary | ICD-10-CM | POA: Diagnosis not present

## 2023-06-26 DIAGNOSIS — R262 Difficulty in walking, not elsewhere classified: Secondary | ICD-10-CM | POA: Diagnosis not present

## 2023-06-26 DIAGNOSIS — R2689 Other abnormalities of gait and mobility: Secondary | ICD-10-CM | POA: Diagnosis not present

## 2023-06-26 DIAGNOSIS — M79672 Pain in left foot: Secondary | ICD-10-CM | POA: Diagnosis not present

## 2023-06-26 DIAGNOSIS — S93325A Dislocation of tarsometatarsal joint of left foot, initial encounter: Secondary | ICD-10-CM | POA: Diagnosis not present

## 2023-06-26 DIAGNOSIS — M6281 Muscle weakness (generalized): Secondary | ICD-10-CM | POA: Diagnosis not present

## 2023-06-28 DIAGNOSIS — R2689 Other abnormalities of gait and mobility: Secondary | ICD-10-CM | POA: Diagnosis not present

## 2023-06-28 DIAGNOSIS — M79672 Pain in left foot: Secondary | ICD-10-CM | POA: Diagnosis not present

## 2023-06-28 DIAGNOSIS — M6281 Muscle weakness (generalized): Secondary | ICD-10-CM | POA: Diagnosis not present

## 2023-06-28 DIAGNOSIS — S93325A Dislocation of tarsometatarsal joint of left foot, initial encounter: Secondary | ICD-10-CM | POA: Diagnosis not present

## 2023-06-28 DIAGNOSIS — R262 Difficulty in walking, not elsewhere classified: Secondary | ICD-10-CM | POA: Diagnosis not present

## 2023-06-29 DIAGNOSIS — H33312 Horseshoe tear of retina without detachment, left eye: Secondary | ICD-10-CM | POA: Diagnosis not present

## 2023-06-29 DIAGNOSIS — H35413 Lattice degeneration of retina, bilateral: Secondary | ICD-10-CM | POA: Diagnosis not present

## 2023-06-29 DIAGNOSIS — H35363 Drusen (degenerative) of macula, bilateral: Secondary | ICD-10-CM | POA: Diagnosis not present

## 2023-07-03 DIAGNOSIS — R262 Difficulty in walking, not elsewhere classified: Secondary | ICD-10-CM | POA: Diagnosis not present

## 2023-07-03 DIAGNOSIS — S93325A Dislocation of tarsometatarsal joint of left foot, initial encounter: Secondary | ICD-10-CM | POA: Diagnosis not present

## 2023-07-03 DIAGNOSIS — M79672 Pain in left foot: Secondary | ICD-10-CM | POA: Diagnosis not present

## 2023-07-03 DIAGNOSIS — R2689 Other abnormalities of gait and mobility: Secondary | ICD-10-CM | POA: Diagnosis not present

## 2023-07-03 DIAGNOSIS — M6281 Muscle weakness (generalized): Secondary | ICD-10-CM | POA: Diagnosis not present

## 2023-07-06 DIAGNOSIS — H33312 Horseshoe tear of retina without detachment, left eye: Secondary | ICD-10-CM | POA: Diagnosis not present

## 2023-07-07 DIAGNOSIS — Z940 Kidney transplant status: Secondary | ICD-10-CM | POA: Diagnosis not present

## 2023-07-07 DIAGNOSIS — N179 Acute kidney failure, unspecified: Secondary | ICD-10-CM | POA: Diagnosis not present

## 2023-07-07 DIAGNOSIS — N058 Unspecified nephritic syndrome with other morphologic changes: Secondary | ICD-10-CM | POA: Diagnosis not present

## 2023-07-07 DIAGNOSIS — D849 Immunodeficiency, unspecified: Secondary | ICD-10-CM | POA: Diagnosis not present

## 2023-07-07 DIAGNOSIS — B338 Other specified viral diseases: Secondary | ICD-10-CM | POA: Diagnosis not present

## 2023-07-07 DIAGNOSIS — T8611 Kidney transplant rejection: Secondary | ICD-10-CM | POA: Diagnosis not present

## 2023-07-10 DIAGNOSIS — M6281 Muscle weakness (generalized): Secondary | ICD-10-CM | POA: Diagnosis not present

## 2023-07-10 DIAGNOSIS — S93325A Dislocation of tarsometatarsal joint of left foot, initial encounter: Secondary | ICD-10-CM | POA: Diagnosis not present

## 2023-07-10 DIAGNOSIS — R262 Difficulty in walking, not elsewhere classified: Secondary | ICD-10-CM | POA: Diagnosis not present

## 2023-07-10 DIAGNOSIS — M79672 Pain in left foot: Secondary | ICD-10-CM | POA: Diagnosis not present

## 2023-07-10 DIAGNOSIS — R2689 Other abnormalities of gait and mobility: Secondary | ICD-10-CM | POA: Diagnosis not present

## 2023-07-17 DIAGNOSIS — M79672 Pain in left foot: Secondary | ICD-10-CM | POA: Diagnosis not present

## 2023-07-17 DIAGNOSIS — R2689 Other abnormalities of gait and mobility: Secondary | ICD-10-CM | POA: Diagnosis not present

## 2023-07-17 DIAGNOSIS — M6281 Muscle weakness (generalized): Secondary | ICD-10-CM | POA: Diagnosis not present

## 2023-07-17 DIAGNOSIS — R262 Difficulty in walking, not elsewhere classified: Secondary | ICD-10-CM | POA: Diagnosis not present

## 2023-07-17 DIAGNOSIS — S93325A Dislocation of tarsometatarsal joint of left foot, initial encounter: Secondary | ICD-10-CM | POA: Diagnosis not present

## 2023-07-23 DIAGNOSIS — Z940 Kidney transplant status: Secondary | ICD-10-CM | POA: Diagnosis not present

## 2023-07-23 DIAGNOSIS — B338 Other specified viral diseases: Secondary | ICD-10-CM | POA: Diagnosis not present

## 2023-07-23 DIAGNOSIS — N058 Unspecified nephritic syndrome with other morphologic changes: Secondary | ICD-10-CM | POA: Diagnosis not present

## 2023-07-23 DIAGNOSIS — D849 Immunodeficiency, unspecified: Secondary | ICD-10-CM | POA: Diagnosis not present

## 2023-07-23 DIAGNOSIS — M3214 Glomerular disease in systemic lupus erythematosus: Secondary | ICD-10-CM | POA: Diagnosis not present

## 2023-07-23 DIAGNOSIS — I272 Pulmonary hypertension, unspecified: Secondary | ICD-10-CM | POA: Diagnosis not present

## 2023-07-28 DIAGNOSIS — B372 Candidiasis of skin and nail: Secondary | ICD-10-CM | POA: Diagnosis not present

## 2023-07-31 DIAGNOSIS — S93325A Dislocation of tarsometatarsal joint of left foot, initial encounter: Secondary | ICD-10-CM | POA: Diagnosis not present

## 2023-07-31 DIAGNOSIS — M79672 Pain in left foot: Secondary | ICD-10-CM | POA: Diagnosis not present

## 2023-07-31 DIAGNOSIS — M6281 Muscle weakness (generalized): Secondary | ICD-10-CM | POA: Diagnosis not present

## 2023-07-31 DIAGNOSIS — R262 Difficulty in walking, not elsewhere classified: Secondary | ICD-10-CM | POA: Diagnosis not present

## 2023-07-31 DIAGNOSIS — R2689 Other abnormalities of gait and mobility: Secondary | ICD-10-CM | POA: Diagnosis not present

## 2023-08-04 DIAGNOSIS — N058 Unspecified nephritic syndrome with other morphologic changes: Secondary | ICD-10-CM | POA: Diagnosis not present

## 2023-08-04 DIAGNOSIS — B338 Other specified viral diseases: Secondary | ICD-10-CM | POA: Diagnosis not present

## 2023-08-04 DIAGNOSIS — T8611 Kidney transplant rejection: Secondary | ICD-10-CM | POA: Diagnosis not present

## 2023-08-04 DIAGNOSIS — D849 Immunodeficiency, unspecified: Secondary | ICD-10-CM | POA: Diagnosis not present

## 2023-08-05 DIAGNOSIS — H00022 Hordeolum internum right lower eyelid: Secondary | ICD-10-CM | POA: Diagnosis not present

## 2023-08-07 DIAGNOSIS — M79672 Pain in left foot: Secondary | ICD-10-CM | POA: Diagnosis not present

## 2023-08-07 DIAGNOSIS — R262 Difficulty in walking, not elsewhere classified: Secondary | ICD-10-CM | POA: Diagnosis not present

## 2023-08-07 DIAGNOSIS — R2689 Other abnormalities of gait and mobility: Secondary | ICD-10-CM | POA: Diagnosis not present

## 2023-08-07 DIAGNOSIS — S93325A Dislocation of tarsometatarsal joint of left foot, initial encounter: Secondary | ICD-10-CM | POA: Diagnosis not present

## 2023-08-07 DIAGNOSIS — M6281 Muscle weakness (generalized): Secondary | ICD-10-CM | POA: Diagnosis not present

## 2023-08-14 DIAGNOSIS — Z1231 Encounter for screening mammogram for malignant neoplasm of breast: Secondary | ICD-10-CM | POA: Diagnosis not present

## 2023-08-14 DIAGNOSIS — Z23 Encounter for immunization: Secondary | ICD-10-CM | POA: Diagnosis not present

## 2023-08-24 DIAGNOSIS — M818 Other osteoporosis without current pathological fracture: Secondary | ICD-10-CM | POA: Diagnosis not present

## 2023-08-24 DIAGNOSIS — M3214 Glomerular disease in systemic lupus erythematosus: Secondary | ICD-10-CM | POA: Diagnosis not present

## 2023-08-24 DIAGNOSIS — T380X5A Adverse effect of glucocorticoids and synthetic analogues, initial encounter: Secondary | ICD-10-CM | POA: Diagnosis not present

## 2023-08-24 DIAGNOSIS — Z79899 Other long term (current) drug therapy: Secondary | ICD-10-CM | POA: Diagnosis not present

## 2023-08-28 DIAGNOSIS — Z79899 Other long term (current) drug therapy: Secondary | ICD-10-CM | POA: Diagnosis not present

## 2023-08-28 DIAGNOSIS — H35363 Drusen (degenerative) of macula, bilateral: Secondary | ICD-10-CM | POA: Diagnosis not present

## 2023-08-28 DIAGNOSIS — H35413 Lattice degeneration of retina, bilateral: Secondary | ICD-10-CM | POA: Diagnosis not present

## 2023-08-31 DIAGNOSIS — H6692 Otitis media, unspecified, left ear: Secondary | ICD-10-CM | POA: Diagnosis not present

## 2023-08-31 DIAGNOSIS — B309 Viral conjunctivitis, unspecified: Secondary | ICD-10-CM | POA: Diagnosis not present

## 2023-09-01 DIAGNOSIS — N058 Unspecified nephritic syndrome with other morphologic changes: Secondary | ICD-10-CM | POA: Diagnosis not present

## 2023-09-01 DIAGNOSIS — N179 Acute kidney failure, unspecified: Secondary | ICD-10-CM | POA: Diagnosis not present

## 2023-09-01 DIAGNOSIS — D849 Immunodeficiency, unspecified: Secondary | ICD-10-CM | POA: Diagnosis not present

## 2023-09-01 DIAGNOSIS — T8611 Kidney transplant rejection: Secondary | ICD-10-CM | POA: Diagnosis not present

## 2023-09-01 DIAGNOSIS — B338 Other specified viral diseases: Secondary | ICD-10-CM | POA: Diagnosis not present

## 2023-09-18 DIAGNOSIS — H33312 Horseshoe tear of retina without detachment, left eye: Secondary | ICD-10-CM | POA: Diagnosis not present

## 2023-09-18 DIAGNOSIS — H35363 Drusen (degenerative) of macula, bilateral: Secondary | ICD-10-CM | POA: Diagnosis not present

## 2023-09-18 DIAGNOSIS — H35413 Lattice degeneration of retina, bilateral: Secondary | ICD-10-CM | POA: Diagnosis not present

## 2023-10-01 DIAGNOSIS — B338 Other specified viral diseases: Secondary | ICD-10-CM | POA: Diagnosis not present

## 2023-10-01 DIAGNOSIS — N179 Acute kidney failure, unspecified: Secondary | ICD-10-CM | POA: Diagnosis not present

## 2023-10-01 DIAGNOSIS — N058 Unspecified nephritic syndrome with other morphologic changes: Secondary | ICD-10-CM | POA: Diagnosis not present

## 2023-10-01 DIAGNOSIS — T8611 Kidney transplant rejection: Secondary | ICD-10-CM | POA: Diagnosis not present

## 2023-10-01 DIAGNOSIS — D849 Immunodeficiency, unspecified: Secondary | ICD-10-CM | POA: Diagnosis not present

## 2023-10-22 DIAGNOSIS — Z940 Kidney transplant status: Secondary | ICD-10-CM | POA: Diagnosis not present

## 2023-10-22 DIAGNOSIS — T8619 Other complication of kidney transplant: Secondary | ICD-10-CM | POA: Diagnosis not present

## 2023-10-22 DIAGNOSIS — I272 Pulmonary hypertension, unspecified: Secondary | ICD-10-CM | POA: Diagnosis not present

## 2023-10-22 DIAGNOSIS — R21 Rash and other nonspecific skin eruption: Secondary | ICD-10-CM | POA: Diagnosis not present

## 2023-10-22 DIAGNOSIS — D849 Immunodeficiency, unspecified: Secondary | ICD-10-CM | POA: Diagnosis not present

## 2023-10-22 DIAGNOSIS — N184 Chronic kidney disease, stage 4 (severe): Secondary | ICD-10-CM | POA: Diagnosis not present

## 2023-10-22 DIAGNOSIS — N058 Unspecified nephritic syndrome with other morphologic changes: Secondary | ICD-10-CM | POA: Diagnosis not present

## 2023-10-22 DIAGNOSIS — B338 Other specified viral diseases: Secondary | ICD-10-CM | POA: Diagnosis not present

## 2023-10-27 DIAGNOSIS — B338 Other specified viral diseases: Secondary | ICD-10-CM | POA: Diagnosis not present

## 2023-10-27 DIAGNOSIS — N058 Unspecified nephritic syndrome with other morphologic changes: Secondary | ICD-10-CM | POA: Diagnosis not present

## 2023-10-27 DIAGNOSIS — N179 Acute kidney failure, unspecified: Secondary | ICD-10-CM | POA: Diagnosis not present

## 2023-10-27 DIAGNOSIS — D84821 Immunodeficiency due to drugs: Secondary | ICD-10-CM | POA: Diagnosis not present

## 2023-10-27 DIAGNOSIS — T8611 Kidney transplant rejection: Secondary | ICD-10-CM | POA: Diagnosis not present

## 2023-10-31 DIAGNOSIS — B354 Tinea corporis: Secondary | ICD-10-CM | POA: Diagnosis not present

## 2023-10-31 DIAGNOSIS — J3489 Other specified disorders of nose and nasal sinuses: Secondary | ICD-10-CM | POA: Diagnosis not present

## 2023-11-24 DIAGNOSIS — N179 Acute kidney failure, unspecified: Secondary | ICD-10-CM | POA: Diagnosis not present

## 2023-11-24 DIAGNOSIS — D849 Immunodeficiency, unspecified: Secondary | ICD-10-CM | POA: Diagnosis not present

## 2023-11-24 DIAGNOSIS — N058 Unspecified nephritic syndrome with other morphologic changes: Secondary | ICD-10-CM | POA: Diagnosis not present

## 2023-11-24 DIAGNOSIS — T8611 Kidney transplant rejection: Secondary | ICD-10-CM | POA: Diagnosis not present

## 2023-11-24 DIAGNOSIS — B338 Other specified viral diseases: Secondary | ICD-10-CM | POA: Diagnosis not present

## 2023-11-28 DIAGNOSIS — R0600 Dyspnea, unspecified: Secondary | ICD-10-CM | POA: Diagnosis not present

## 2023-11-28 DIAGNOSIS — I272 Pulmonary hypertension, unspecified: Secondary | ICD-10-CM | POA: Diagnosis not present

## 2023-11-28 DIAGNOSIS — R0689 Other abnormalities of breathing: Secondary | ICD-10-CM | POA: Diagnosis not present

## 2023-12-19 DIAGNOSIS — R6889 Other general symptoms and signs: Secondary | ICD-10-CM | POA: Diagnosis not present

## 2024-01-07 DIAGNOSIS — I272 Pulmonary hypertension, unspecified: Secondary | ICD-10-CM | POA: Diagnosis not present

## 2024-01-07 DIAGNOSIS — I081 Rheumatic disorders of both mitral and tricuspid valves: Secondary | ICD-10-CM | POA: Diagnosis not present

## 2024-01-07 DIAGNOSIS — R0689 Other abnormalities of breathing: Secondary | ICD-10-CM | POA: Diagnosis not present

## 2024-01-07 DIAGNOSIS — R0600 Dyspnea, unspecified: Secondary | ICD-10-CM | POA: Diagnosis not present

## 2024-01-14 DIAGNOSIS — Z940 Kidney transplant status: Secondary | ICD-10-CM | POA: Diagnosis not present

## 2024-01-15 DIAGNOSIS — N058 Unspecified nephritic syndrome with other morphologic changes: Secondary | ICD-10-CM | POA: Diagnosis not present

## 2024-01-15 DIAGNOSIS — B338 Other specified viral diseases: Secondary | ICD-10-CM | POA: Diagnosis not present

## 2024-01-15 DIAGNOSIS — I272 Pulmonary hypertension, unspecified: Secondary | ICD-10-CM | POA: Diagnosis not present

## 2024-01-15 DIAGNOSIS — Z940 Kidney transplant status: Secondary | ICD-10-CM | POA: Diagnosis not present

## 2024-01-15 DIAGNOSIS — Z136 Encounter for screening for cardiovascular disorders: Secondary | ICD-10-CM | POA: Diagnosis not present

## 2024-01-15 DIAGNOSIS — Z1159 Encounter for screening for other viral diseases: Secondary | ICD-10-CM | POA: Diagnosis not present

## 2024-01-15 DIAGNOSIS — R0602 Shortness of breath: Secondary | ICD-10-CM | POA: Diagnosis not present

## 2024-01-15 DIAGNOSIS — Z131 Encounter for screening for diabetes mellitus: Secondary | ICD-10-CM | POA: Diagnosis not present

## 2024-01-15 DIAGNOSIS — Z7682 Awaiting organ transplant status: Secondary | ICD-10-CM | POA: Diagnosis not present

## 2024-01-15 DIAGNOSIS — E213 Hyperparathyroidism, unspecified: Secondary | ICD-10-CM | POA: Diagnosis not present

## 2024-01-21 DIAGNOSIS — L821 Other seborrheic keratosis: Secondary | ICD-10-CM | POA: Diagnosis not present

## 2024-01-21 DIAGNOSIS — D2270 Melanocytic nevi of unspecified lower limb, including hip: Secondary | ICD-10-CM | POA: Diagnosis not present

## 2024-01-21 DIAGNOSIS — D2260 Melanocytic nevi of unspecified upper limb, including shoulder: Secondary | ICD-10-CM | POA: Diagnosis not present

## 2024-01-21 DIAGNOSIS — M3214 Glomerular disease in systemic lupus erythematosus: Secondary | ICD-10-CM | POA: Diagnosis not present

## 2024-01-21 DIAGNOSIS — L932 Other local lupus erythematosus: Secondary | ICD-10-CM | POA: Diagnosis not present

## 2024-01-21 DIAGNOSIS — B372 Candidiasis of skin and nail: Secondary | ICD-10-CM | POA: Diagnosis not present

## 2024-01-21 DIAGNOSIS — D225 Melanocytic nevi of trunk: Secondary | ICD-10-CM | POA: Diagnosis not present

## 2024-02-11 DIAGNOSIS — Z940 Kidney transplant status: Secondary | ICD-10-CM | POA: Diagnosis not present

## 2024-02-25 DIAGNOSIS — M818 Other osteoporosis without current pathological fracture: Secondary | ICD-10-CM | POA: Diagnosis not present

## 2024-02-25 DIAGNOSIS — M3214 Glomerular disease in systemic lupus erythematosus: Secondary | ICD-10-CM | POA: Diagnosis not present

## 2024-02-25 DIAGNOSIS — T380X5A Adverse effect of glucocorticoids and synthetic analogues, initial encounter: Secondary | ICD-10-CM | POA: Diagnosis not present

## 2024-02-25 DIAGNOSIS — T8611 Kidney transplant rejection: Secondary | ICD-10-CM | POA: Diagnosis not present

## 2024-02-25 DIAGNOSIS — Z79899 Other long term (current) drug therapy: Secondary | ICD-10-CM | POA: Diagnosis not present

## 2024-03-04 DIAGNOSIS — M818 Other osteoporosis without current pathological fracture: Secondary | ICD-10-CM | POA: Diagnosis not present

## 2024-03-04 DIAGNOSIS — T380X5A Adverse effect of glucocorticoids and synthetic analogues, initial encounter: Secondary | ICD-10-CM | POA: Diagnosis not present

## 2024-03-12 DIAGNOSIS — Z940 Kidney transplant status: Secondary | ICD-10-CM | POA: Diagnosis not present

## 2024-04-10 DIAGNOSIS — Z940 Kidney transplant status: Secondary | ICD-10-CM | POA: Diagnosis not present

## 2024-04-21 DIAGNOSIS — D849 Immunodeficiency, unspecified: Secondary | ICD-10-CM | POA: Diagnosis not present

## 2024-04-21 DIAGNOSIS — I10 Essential (primary) hypertension: Secondary | ICD-10-CM | POA: Diagnosis not present

## 2024-04-21 DIAGNOSIS — Z940 Kidney transplant status: Secondary | ICD-10-CM | POA: Diagnosis not present

## 2024-04-21 DIAGNOSIS — B348 Other viral infections of unspecified site: Secondary | ICD-10-CM | POA: Diagnosis not present

## 2024-04-21 DIAGNOSIS — M3219 Other organ or system involvement in systemic lupus erythematosus: Secondary | ICD-10-CM | POA: Diagnosis not present

## 2024-04-21 DIAGNOSIS — Z48298 Encounter for aftercare following other organ transplant: Secondary | ICD-10-CM | POA: Diagnosis not present

## 2024-04-21 DIAGNOSIS — B338 Other specified viral diseases: Secondary | ICD-10-CM | POA: Diagnosis not present

## 2024-04-21 DIAGNOSIS — N058 Unspecified nephritic syndrome with other morphologic changes: Secondary | ICD-10-CM | POA: Diagnosis not present

## 2024-04-21 DIAGNOSIS — Z79621 Long term (current) use of calcineurin inhibitor: Secondary | ICD-10-CM | POA: Diagnosis not present

## 2024-05-05 DIAGNOSIS — Z940 Kidney transplant status: Secondary | ICD-10-CM | POA: Diagnosis not present

## 2024-05-15 DIAGNOSIS — M3214 Glomerular disease in systemic lupus erythematosus: Secondary | ICD-10-CM | POA: Diagnosis not present

## 2024-05-15 DIAGNOSIS — Z940 Kidney transplant status: Secondary | ICD-10-CM | POA: Diagnosis not present

## 2024-05-15 DIAGNOSIS — L659 Nonscarring hair loss, unspecified: Secondary | ICD-10-CM | POA: Diagnosis not present

## 2024-05-15 DIAGNOSIS — Z79899 Other long term (current) drug therapy: Secondary | ICD-10-CM | POA: Diagnosis not present

## 2024-05-15 DIAGNOSIS — B372 Candidiasis of skin and nail: Secondary | ICD-10-CM | POA: Diagnosis not present

## 2024-05-15 DIAGNOSIS — L932 Other local lupus erythematosus: Secondary | ICD-10-CM | POA: Diagnosis not present

## 2024-06-02 DIAGNOSIS — Z940 Kidney transplant status: Secondary | ICD-10-CM | POA: Diagnosis not present

## 2024-06-10 DIAGNOSIS — S93324A Dislocation of tarsometatarsal joint of right foot, initial encounter: Secondary | ICD-10-CM | POA: Diagnosis not present

## 2024-06-26 DIAGNOSIS — R430 Anosmia: Secondary | ICD-10-CM | POA: Diagnosis not present

## 2024-06-26 DIAGNOSIS — J310 Chronic rhinitis: Secondary | ICD-10-CM | POA: Diagnosis not present

## 2024-06-26 DIAGNOSIS — R431 Parosmia: Secondary | ICD-10-CM | POA: Diagnosis not present

## 2024-07-07 DIAGNOSIS — Z940 Kidney transplant status: Secondary | ICD-10-CM | POA: Diagnosis not present
